# Patient Record
Sex: Female | Born: 1950 | Race: White | Hispanic: No | Marital: Single | Smoking: Former smoker
Health system: Southern US, Community
[De-identification: ages and names within clinical notes are randomized; demographics above are authoritative.]

## PROBLEM LIST (undated history)

## (undated) DIAGNOSIS — E0590 Thyrotoxicosis, unspecified without thyrotoxic crisis or storm: Secondary | ICD-10-CM

## (undated) DIAGNOSIS — Z9189 Other specified personal risk factors, not elsewhere classified: Secondary | ICD-10-CM

## (undated) DIAGNOSIS — R011 Cardiac murmur, unspecified: Secondary | ICD-10-CM

## (undated) DIAGNOSIS — M1990 Unspecified osteoarthritis, unspecified site: Secondary | ICD-10-CM

## (undated) DIAGNOSIS — R7303 Prediabetes: Secondary | ICD-10-CM

## (undated) DIAGNOSIS — I8390 Asymptomatic varicose veins of unspecified lower extremity: Secondary | ICD-10-CM

## (undated) DIAGNOSIS — J309 Allergic rhinitis, unspecified: Secondary | ICD-10-CM

## (undated) DIAGNOSIS — T7840XA Allergy, unspecified, initial encounter: Secondary | ICD-10-CM

## (undated) HISTORY — DX: Thyrotoxicosis, unspecified without thyrotoxic crisis or storm: E05.90

## (undated) HISTORY — DX: Other specified personal risk factors, not elsewhere classified: Z91.89

## (undated) HISTORY — DX: Asymptomatic varicose veins of unspecified lower extremity: I83.90

## (undated) HISTORY — DX: Allergy, unspecified, initial encounter: T78.40XA

## (undated) HISTORY — DX: Allergic rhinitis, unspecified: J30.9

---

## 1998-04-06 ENCOUNTER — Other Ambulatory Visit: Admission: RE | Admit: 1998-04-06 | Discharge: 1998-04-06 | Payer: Self-pay | Admitting: *Deleted

## 1999-09-27 ENCOUNTER — Other Ambulatory Visit: Admission: RE | Admit: 1999-09-27 | Discharge: 1999-09-27 | Payer: Self-pay | Admitting: *Deleted

## 2000-06-03 ENCOUNTER — Encounter: Payer: Self-pay | Admitting: *Deleted

## 2000-06-03 ENCOUNTER — Encounter: Admission: RE | Admit: 2000-06-03 | Discharge: 2000-06-03 | Payer: Self-pay | Admitting: *Deleted

## 2000-10-18 ENCOUNTER — Other Ambulatory Visit: Admission: RE | Admit: 2000-10-18 | Discharge: 2000-10-18 | Payer: Self-pay | Admitting: *Deleted

## 2001-11-05 ENCOUNTER — Encounter: Payer: Self-pay | Admitting: *Deleted

## 2001-11-05 ENCOUNTER — Encounter: Admission: RE | Admit: 2001-11-05 | Discharge: 2001-11-05 | Payer: Self-pay | Admitting: *Deleted

## 2001-11-13 ENCOUNTER — Other Ambulatory Visit: Admission: RE | Admit: 2001-11-13 | Discharge: 2001-11-13 | Payer: Self-pay | Admitting: *Deleted

## 2002-05-01 ENCOUNTER — Encounter: Admission: RE | Admit: 2002-05-01 | Discharge: 2002-05-01 | Payer: Self-pay | Admitting: Family Medicine

## 2002-05-01 ENCOUNTER — Encounter: Payer: Self-pay | Admitting: Family Medicine

## 2002-11-16 ENCOUNTER — Other Ambulatory Visit: Admission: RE | Admit: 2002-11-16 | Discharge: 2002-11-16 | Payer: Self-pay | Admitting: *Deleted

## 2003-10-15 ENCOUNTER — Emergency Department (HOSPITAL_COMMUNITY): Admission: EM | Admit: 2003-10-15 | Discharge: 2003-10-16 | Payer: Self-pay | Admitting: Emergency Medicine

## 2004-02-14 ENCOUNTER — Other Ambulatory Visit: Admission: RE | Admit: 2004-02-14 | Discharge: 2004-02-14 | Payer: Self-pay | Admitting: *Deleted

## 2004-06-23 ENCOUNTER — Ambulatory Visit: Payer: Self-pay | Admitting: Family Medicine

## 2004-11-03 ENCOUNTER — Ambulatory Visit: Payer: Self-pay | Admitting: Family Medicine

## 2004-11-28 ENCOUNTER — Ambulatory Visit: Payer: Self-pay | Admitting: Family Medicine

## 2005-07-30 ENCOUNTER — Other Ambulatory Visit: Admission: RE | Admit: 2005-07-30 | Discharge: 2005-07-30 | Payer: Self-pay | Admitting: Obstetrics & Gynecology

## 2006-03-05 ENCOUNTER — Ambulatory Visit: Payer: Self-pay | Admitting: Family Medicine

## 2006-03-05 LAB — CONVERTED CEMR LAB
ALT: 19 units/L (ref 0–40)
AST: 19 units/L (ref 0–37)
Albumin: 3.7 g/dL (ref 3.5–5.2)
BUN: 18 mg/dL (ref 6–23)
Basophils Relative: 0 % (ref 0.0–1.0)
Creatinine, Ser: 0.9 mg/dL (ref 0.4–1.2)
Eosinophil percent: 3.6 % (ref 0.0–5.0)
GFR calc non Af Amer: 70 mL/min
HCT: 37.7 % (ref 36.0–46.0)
HDL: 79.3 mg/dL (ref >39.0)
Hemoglobin: 13 g/dL (ref 12.0–15.0)
LDL DIRECT: 113.2 mg/dL
Neutro Abs: 3.5 10*3/uL (ref 1.4–7.7)
Neutrophils Relative %: 60.1 % (ref 43.0–77.0)
Potassium: 4.3 meq/L (ref 3.5–5.1)
RBC: 4.27 M/uL (ref 3.87–5.11)
RDW: 13 % (ref 11.5–14.6)
Sodium: 148 meq/L — ABNORMAL HIGH (ref 135–145)
Total Bilirubin: 0.6 mg/dL (ref 0.3–1.2)
Total Protein: 7.6 g/dL (ref 6.0–8.3)
VLDL: 18 mg/dL (ref 0–40)
WBC: 5.7 10*3/uL (ref 4.5–10.5)

## 2006-03-11 ENCOUNTER — Ambulatory Visit: Payer: Self-pay | Admitting: Family Medicine

## 2006-03-26 ENCOUNTER — Ambulatory Visit: Payer: Self-pay | Admitting: Internal Medicine

## 2006-03-28 ENCOUNTER — Encounter: Admission: RE | Admit: 2006-03-28 | Discharge: 2006-03-28 | Payer: Self-pay | Admitting: Family Medicine

## 2007-04-23 ENCOUNTER — Ambulatory Visit: Payer: Self-pay | Admitting: Family Medicine

## 2007-04-23 LAB — CONVERTED CEMR LAB
ALT: 21 units/L (ref 0–35)
AST: 22 units/L (ref 0–37)
Albumin: 3.5 g/dL (ref 3.5–5.2)
Alkaline Phosphatase: 87 units/L (ref 39–117)
BUN: 17 mg/dL (ref 6–23)
Basophils Absolute: 0.1 10*3/uL (ref 0.0–0.1)
Basophils Relative: 1 % (ref 0.0–1.0)
Bilirubin Urine: NEGATIVE
Bilirubin, Direct: 0.2 mg/dL (ref 0.0–0.3)
Blood in Urine, dipstick: NEGATIVE
CO2: 30 meq/L (ref 19–32)
Calcium: 10 mg/dL (ref 8.4–10.5)
Chloride: 108 meq/L (ref 96–112)
Cholesterol: 201 mg/dL (ref 0–200)
Creatinine, Ser: 0.9 mg/dL (ref 0.4–1.2)
Direct LDL: 104.9 mg/dL
Eosinophils Absolute: 0.3 10*3/uL (ref 0.0–0.6)
Eosinophils Relative: 4.5 % (ref 0.0–5.0)
GFR calc Af Amer: 84 mL/min
GFR calc non Af Amer: 69 mL/min
Glucose, Bld: 99 mg/dL (ref 70–99)
Glucose, Urine, Semiquant: NEGATIVE
HCT: 36.9 % (ref 36.0–46.0)
HDL: 83.2 mg/dL (ref >39.0)
Hemoglobin: 12.4 g/dL (ref 12.0–15.0)
Ketones, urine, test strip: NEGATIVE
Lymphocytes Relative: 23.3 % (ref 12.0–46.0)
MCHC: 33.6 g/dL (ref 30.0–36.0)
MCV: 87.5 fL (ref 78.0–100.0)
Monocytes Absolute: 0.6 10*3/uL (ref 0.2–0.7)
Monocytes Relative: 10.1 % (ref 3.0–11.0)
Neutro Abs: 3.6 10*3/uL (ref 1.4–7.7)
Neutrophils Relative %: 61.1 % (ref 43.0–77.0)
Nitrite: NEGATIVE
Platelets: 370 10*3/uL (ref 150–400)
Potassium: 5.3 meq/L — ABNORMAL HIGH (ref 3.5–5.1)
RBC: 4.22 M/uL (ref 3.87–5.11)
RDW: 12.5 % (ref 11.5–14.6)
Sodium: 146 meq/L — ABNORMAL HIGH (ref 135–145)
Specific Gravity, Urine: 1.02
TSH: 3.83 microintl units/mL (ref 0.35–5.50)
Total Bilirubin: 0.8 mg/dL (ref 0.3–1.2)
Total CHOL/HDL Ratio: 2.4
Total Protein: 6.9 g/dL (ref 6.0–8.3)
Triglycerides: 70 mg/dL (ref 0–149)
VLDL: 14 mg/dL (ref 0–40)
WBC: 6 10*3/uL (ref 4.5–10.5)
pH: 6

## 2007-04-29 ENCOUNTER — Ambulatory Visit: Payer: Self-pay | Admitting: Family Medicine

## 2007-11-28 ENCOUNTER — Other Ambulatory Visit: Admission: RE | Admit: 2007-11-28 | Discharge: 2007-11-28 | Payer: Self-pay | Admitting: Obstetrics & Gynecology

## 2008-04-21 ENCOUNTER — Encounter: Admission: RE | Admit: 2008-04-21 | Discharge: 2008-04-21 | Payer: Self-pay | Admitting: Family Medicine

## 2009-12-06 ENCOUNTER — Ambulatory Visit: Payer: Self-pay | Admitting: Internal Medicine

## 2009-12-07 LAB — CONVERTED CEMR LAB
Basophils Relative: 0.5 % (ref 0.0–3.0)
Bilirubin, Direct: 0.1 mg/dL (ref 0.0–0.3)
Cholesterol: 180 mg/dL (ref 0–200)
Eosinophils Absolute: 0.3 10*3/uL (ref 0.0–0.7)
GFR calc non Af Amer: 82.13 mL/min (ref >60)
HDL: 84.8 mg/dL (ref >39.00)
Hemoglobin, Urine: NEGATIVE
MCHC: 33.5 g/dL (ref 30.0–36.0)
MCV: 89.1 fL (ref 78.0–100.0)
Monocytes Absolute: 0.6 10*3/uL (ref 0.1–1.0)
Neutrophils Relative %: 64.8 % (ref 43.0–77.0)
Nitrite: NEGATIVE
Platelets: 325 10*3/uL (ref 150.0–400.0)
Potassium: 4.7 meq/L (ref 3.5–5.1)
RBC: 4.23 M/uL (ref 3.87–5.11)
Sodium: 144 meq/L (ref 135–145)
Total Protein, Urine: NEGATIVE mg/dL
Total Protein: 6.7 g/dL (ref 6.0–8.3)
Triglycerides: 61 mg/dL (ref 0.0–149.0)
Urobilinogen, UA: 0.2 (ref 0.0–1.0)
VLDL: 12.2 mg/dL (ref 0.0–40.0)

## 2009-12-21 ENCOUNTER — Ambulatory Visit: Payer: Self-pay | Admitting: Internal Medicine

## 2009-12-21 ENCOUNTER — Encounter: Payer: Self-pay | Admitting: Internal Medicine

## 2009-12-21 DIAGNOSIS — Z9189 Other specified personal risk factors, not elsewhere classified: Secondary | ICD-10-CM

## 2009-12-21 DIAGNOSIS — Z87891 Personal history of nicotine dependence: Secondary | ICD-10-CM | POA: Insufficient documentation

## 2009-12-21 DIAGNOSIS — J309 Allergic rhinitis, unspecified: Secondary | ICD-10-CM | POA: Insufficient documentation

## 2009-12-21 DIAGNOSIS — R011 Cardiac murmur, unspecified: Secondary | ICD-10-CM | POA: Insufficient documentation

## 2009-12-21 HISTORY — DX: Other specified personal risk factors, not elsewhere classified: Z91.89

## 2009-12-23 LAB — CONVERTED CEMR LAB
T4, Total: 7.5 ug/dL (ref 5.0–12.5)
TSH: 0.26 microintl units/mL — ABNORMAL LOW (ref 0.35–5.50)

## 2010-03-20 ENCOUNTER — Encounter: Admission: RE | Admit: 2010-03-20 | Discharge: 2010-03-20 | Payer: Self-pay | Admitting: Internal Medicine

## 2010-03-20 LAB — HM MAMMOGRAPHY: HM Mammogram: NEGATIVE

## 2010-06-01 NOTE — Assessment & Plan Note (Signed)
Summary: NEW Cpx/BCBS/#/CD   Vital Signs:  Patient profile:   60 year old female Height:      65 inches (165.10 cm) Weight:      174.8 pounds (79.45 kg) BMI:     29.19 O2 Sat:      97 % on Room air Temp:     97.8 degrees F (36.56 degrees C) oral Pulse rate:   64 / minute BP sitting:   120 / 78  (left arm)  Vitals Entered By: Orlan Leavens RMA (December 21, 2009 3:22 PM)  O2 Flow:  Room air CC: New patient CPX Is Patient Diabetic? No Pain Assessment Patient in pain? no        Primary Care Provider:  Newt Lukes MD  CC:  New patient CPX.  History of Present Illness: new pt to me and our location, prev followed at [place] office location (last seen 03/2007)  patient is here today for annual physical. Patient feels well and has no complaints.   recent death of mom last week - sympathy offered  noted abnormal TSH on labs earlier this month - no hx thyroid problems  Preventive Screening-Counseling & Management  Alcohol-Tobacco     Alcohol drinks/day: <1     Alcohol Counseling: not indicated; use of alcohol is not excessive or problematic     Smoking Status: quit     Tobacco Counseling: to remain off tobacco products  Caffeine-Diet-Exercise     Type of exercise: trainer, gym     Exercise (avg: min/session): 30-60     Times/week: 4     Exercise Counseling: not indicated; exercise is adequate  Safety-Violence-Falls     Seat Belt Counseling: not indicated; patient wears seat belts     Firearm Counseling: not applicable     Violence Counseling: not indicated; no violence risk noted     Fall Risk Counseling: not indicated; no significant falls noted  Clinical Review Panels:  Prevention   Last Mammogram:  ASSESSMENT: Negative - BI-RADS 1^MM DIGITAL SCREENING (04/21/2008)   Last Pap Smear:  Interpretation Result:Negative for intraepithelial Lesion or Malignancy.    (01/29/2008)  Lipid Management   Cholesterol:  180 (12/06/2009)   LDL (bad choesterol):  83  (12/06/2009)   HDL (good cholesterol):  84.80 (12/06/2009)   Triglycerides:  89 (03/05/2006)  CBC   WBC:  6.8 (12/06/2009)   RBC:  4.23 (12/06/2009)   Hgb:  12.6 (12/06/2009)   Hct:  37.7 (12/06/2009)   Platelets:  325.0 (12/06/2009)   MCV  89.1 (12/06/2009)   MCHC  33.5 (12/06/2009)   RDW  13.3 (12/06/2009)   PMN:  64.8 (12/06/2009)   Lymphs:  22.0 (12/06/2009)   Monos:  8.7 (12/06/2009)   Eosinophils:  4.0 (12/06/2009)   Basophil:  0.5 (12/06/2009)  Complete Metabolic Panel   Glucose:  96 (12/06/2009)   Sodium:  144 (12/06/2009)   Potassium:  4.7 (12/06/2009)   Chloride:  106 (12/06/2009)   CO2:  30 (12/06/2009)   BUN:  14 (12/06/2009)   Creatinine:  0.8 (12/06/2009)   Albumin:  3.7 (12/06/2009)   Total Protein:  6.7 (12/06/2009)   Calcium:  9.6 (12/06/2009)   Total Bili:  0.7 (12/06/2009)   Alk Phos:  74 (12/06/2009)   SGPT (ALT):  17 (12/06/2009)   SGOT (AST):  18 (12/06/2009)   Current Medications (verified): 1)  Loratadine 10 Mg Tabs (Loratadine) .... Take 1 By Mouth Once Daily 2)  Restasis 0.05 % Emul (Cyclosporine) .... Use 2 Drops  in The Morning  Allergies (verified): 1)  ! Steroids  Past History:  Past Medical History: Allergic rhinitis  MD roster: gyn - Leda Quail  Past Surgical History: Denies surgical history  Family History: father: coronary disease and autoimmune disease, smoker  dad died in Sep 12, 2001 of those diseases.  Mother had an MI at 96, smoker.  Three brothers in good health.  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 I have reviewed all other systems and they were negative.   Physical Exam  General:  overweight-appearing.  alert, well-developed, well-nourished, and cooperative to examination.    Head:  Normocephalic and atraumatic without obvious  abnormalities. No apparent alopecia or balding. Eyes:  vision grossly intact; pupils equal, round and reactive to light.  conjunctiva and lids normal.    Ears:  normal pinnae bilaterally, without erythema, swelling, or tenderness to palpation. TMs clear, without effusion, or cerumen impaction. Hearing grossly normal bilaterally  Mouth:  teeth and gums in good repair; mucous membranes moist, without lesions or ulcers. oropharynx clear without exudate, no erythema.  Neck:  supple, full ROM, no masses, no thyromegaly; no thyroid nodules or tenderness. no JVD or carotid bruits.   Lungs:  normal respiratory effort, no intercostal retractions or use of accessory muscles; normal breath sounds bilaterally - no crackles and no wheezes.    Heart:  normal rate, regular rhythm, no murmur, and no rub. BLE without edema. normal DP pulses and normal cap refill in all 4 extremities    Abdomen:  soft, non-tender, normal bowel sounds, no distention; no masses and no appreciable hepatomegaly or splenomegaly.   Genitalia:  defer gyn Msk:  No deformity or scoliosis noted of thoracic or lumbar spine.   Neurologic:  alert & oriented X3 and cranial nerves II-XII symetrically intact.  strength normal in all extremities, sensation intact to light touch, and gait normal. speech fluent without dysarthria or aphasia; follows commands with good comprehension.  Skin:  no rashes, vesicles, ulcers, or erythema. No nodules or irregularity to palpation.  Psych:  Oriented X3, memory intact for recent and remote, normally interactive, good eye contact, not anxious appearing, not depressed appearing, and not agitated.      Impression & Recommendations:  Problem # 1:  HEALTH SCREENING (ICD-V70.0) Patient has been counseled on age-appropriate routine health concerns for screening and prevention. These are reviewed and up-to-date. Immunizations are up-to-date or declined. Labs and ECG reviewed.  declines colo and vaccines, will self sched  mammo Orders: EKG w/ Interpretation (93000)  Problem # 2:  THYROID STIMULATING HORMONE, ABNORMAL (ICD-246.9) incidental on CPX labs - few clinical symptoms hyperthyroid or toxicity- recheck TFTs now and ?[place] if abn -  Orders: TLB-T3, Free (Triiodothyronine) (84481-T3FREE) TLB-TSH (Thyroid Stimulating Hormone) (84443-TSH) TLB-T4 (Thyrox), Total (84436-T4)  Complete Medication List: 1)  Loratadine 10 Mg Tabs (Loratadine) .... Take 1 by mouth once daily 2)  Restasis 0.05 % Emul (Cyclosporine) .... Use 2 drops in the morning  Patient Instructions: 1)  it was good to see you today. 2)  exam, EKG and labs reviewed today - 3)  test(s) ordered today for thyroid- your results will be posted on the phone tree for review in 48-72 hours from the time of test completion; call [phone] and enter your 9 digit MRN (listed above on this page, just below  your name); if any changes need to be made or there are abnormal results, you will be contacted directly.  4)  Schedule your mammogram in October as discussed. 5)  Complete your Hemocult cards and return them soon. 6)  Please schedule a follow-up appointment annually for your medical physical, sooner if problems.    Mammogram  Procedure date:  01/29/2008  Findings:      No specific mammographic evidence of malignancy.    Pap Smear  Procedure date:  01/29/2008  Findings:      Interpretation Result:Negative for intraepithelial Lesion or Malignancy.      Contraindications/Deferment of Procedures/Staging:    Test/Procedure: FLU VAX    Reason for deferment: patient declined     Test/Procedure: Colonoscopy    Reason for deferment: patient declined     Test/Procedure: TD vaccine    Reason for deferment: declined

## 2011-09-25 ENCOUNTER — Telehealth: Payer: Self-pay | Admitting: *Deleted

## 2011-09-25 DIAGNOSIS — Z0000 Encounter for general adult medical examination without abnormal findings: Secondary | ICD-10-CM

## 2011-09-25 NOTE — Telephone Encounter (Signed)
Message copied by Deatra James on Tue Sep 25, 2011  8:30 AM ------      Message from: Etheleen Sia      Created: Fri Sep 21, 2011 10:51 AM      Regarding: PHYSICAL LABS       PHYSICAL LABS

## 2011-09-25 NOTE — Telephone Encounter (Signed)
Received staff msg pt made cpx for July. Need labs entered... 09/25/11@8 :31am/LMB

## 2011-10-01 ENCOUNTER — Encounter: Payer: Self-pay | Admitting: Internal Medicine

## 2011-10-31 ENCOUNTER — Other Ambulatory Visit (INDEPENDENT_AMBULATORY_CARE_PROVIDER_SITE_OTHER): Payer: Self-pay

## 2011-10-31 DIAGNOSIS — Z0000 Encounter for general adult medical examination without abnormal findings: Secondary | ICD-10-CM

## 2011-10-31 LAB — CBC WITH DIFFERENTIAL/PLATELET
Basophils Relative: 0.4 % (ref 0.0–3.0)
Eosinophils Absolute: 0.2 10*3/uL (ref 0.0–0.7)
HCT: 36.5 % (ref 36.0–46.0)
Lymphs Abs: 1.8 10*3/uL (ref 0.7–4.0)
MCHC: 33 g/dL (ref 30.0–36.0)
MCV: 84.9 fl (ref 78.0–100.0)
Monocytes Absolute: 0.6 10*3/uL (ref 0.1–1.0)
Neutrophils Relative %: 51.1 % (ref 43.0–77.0)
Platelets: 284 10*3/uL (ref 150.0–400.0)

## 2011-10-31 LAB — TSH: TSH: 0.02 u[IU]/mL — ABNORMAL LOW (ref 0.35–5.50)

## 2011-10-31 LAB — URINALYSIS, ROUTINE W REFLEX MICROSCOPIC
Hgb urine dipstick: NEGATIVE
Nitrite: NEGATIVE
Total Protein, Urine: NEGATIVE

## 2011-10-31 LAB — BASIC METABOLIC PANEL
BUN: 15 mg/dL (ref 6–23)
CO2: 30 mEq/L (ref 19–32)
Chloride: 107 mEq/L (ref 96–112)
Creatinine, Ser: 0.6 mg/dL (ref 0.4–1.2)
Glucose, Bld: 106 mg/dL — ABNORMAL HIGH (ref 70–99)

## 2011-10-31 LAB — LIPID PANEL
Cholesterol: 182 mg/dL (ref 0–200)
Triglycerides: 62 mg/dL (ref 0.0–149.0)

## 2011-10-31 LAB — HEPATIC FUNCTION PANEL
ALT: 23 U/L (ref 0–35)
Albumin: 3.6 g/dL (ref 3.5–5.2)
Total Bilirubin: 0.9 mg/dL (ref 0.3–1.2)

## 2011-11-05 ENCOUNTER — Ambulatory Visit (INDEPENDENT_AMBULATORY_CARE_PROVIDER_SITE_OTHER): Payer: BC Managed Care – PPO | Admitting: Internal Medicine

## 2011-11-05 ENCOUNTER — Encounter: Payer: Self-pay | Admitting: Internal Medicine

## 2011-11-05 VITALS — BP 130/78 | HR 69 | Temp 97.8°F | Ht 66.0 in | Wt 169.8 lb

## 2011-11-05 DIAGNOSIS — E0590 Thyrotoxicosis, unspecified without thyrotoxic crisis or storm: Secondary | ICD-10-CM

## 2011-11-05 DIAGNOSIS — M7650 Patellar tendinitis, unspecified knee: Secondary | ICD-10-CM

## 2011-11-05 DIAGNOSIS — I8390 Asymptomatic varicose veins of unspecified lower extremity: Secondary | ICD-10-CM

## 2011-11-05 DIAGNOSIS — Z0000 Encounter for general adult medical examination without abnormal findings: Secondary | ICD-10-CM

## 2011-11-05 MED ORDER — CLOBETASOL PROPIONATE 0.05 % EX OINT: TOPICAL_OINTMENT | Freq: Two times a day (BID) | CUTANEOUS | Status: AC

## 2011-11-05 MED ORDER — NAPROXEN SODIUM 220 MG PO TABS: 440.0000 mg | ORAL_TABLET | Freq: Two times a day (BID) | ORAL | Status: DC

## 2011-11-05 NOTE — Progress Notes (Signed)
Subjective:    Patient ID: Jaime Rose, female    DOB: 1950/08/26, 61 y.o.   MRN: [id number]  HPI patient is here today for annual physical. Patient feels well overall.  Past Medical History  Diagnosis Date  . ALLERGIC RHINITIS   . Hyperthyroidism     normal TFTs 11/2009 , dx 10/2011   Family History  Problem Relation Age of Onset  . Heart disease Father   . Autoimmune disease Father    History  Substance Use Topics  . Smoking status: Never Smoker   . Smokeless tobacco: Not on file   Comment: Singles, lives with 2 dtrs- Victory Dakin & Tresa Endo  . Alcohol Use: No    Review of Systems Constitutional: Negative for fever - unexpected 10# weight loss.  Respiratory: Negative for cough and shortness of breath.   Cardiovascular: Negative for chest pain or palpitations.  Gastrointestinal: Negative for abdominal pain, no bowel changes.  Musculoskeletal: Ongoing B anterior knee pain x 2 months -Negative for gait problem or joint swelling.  Skin: Negative for rash. increasing varicose veins on BLE Neurological: Negative for dizziness or headache.  No other specific complaints in a complete review of systems (except as listed in HPI above).     Objective:   Physical Exam BP 130/78  Pulse 69  Temp 97.8 F (36.6 C) (Oral)  Ht 5\' 6"  (1.676 m)  Wt 169 lb 12.8 oz (77.021 kg)  BMI 27.41 kg/m2  SpO2 97% Wt Readings from Last 3 Encounters:  11/05/11 169 lb 12.8 oz (77.021 kg)  12/21/09 174 lb 12.8 oz (79.289 kg)  04/29/07 191 lb (86.637 kg)   Constitutional: She appears well-developed and well-nourished. No distress.  HENT: Head: Normocephalic and atraumatic. Ears: B TMs ok, no erythema or effusion; Nose: Nose normal. Mouth/Throat: Oropharynx is clear and moist. No oropharyngeal exudate.  Eyes: Conjunctivae and EOM are normal. Pupils are equal, round, and reactive to light. No scleral icterus.  Neck: Normal range of motion. Neck supple. No JVD present. No thyromegaly present.    Cardiovascular: Normal rate, regular rhythm and normal heart sounds.  No murmur heard. No BLE edema. Pulmonary/Chest: Effort normal and breath sounds normal. No respiratory distress. She has no wheezes.  Abdominal: Soft. Bowel sounds are normal. She exhibits no distension. There is no tenderness. no masses Musculoskeletal: B knees with normal range of motion, no joint effusions. No gross deformities.  Neurological: She is alert and oriented to person, place, and time. No cranial nerve deficit. Coordination normal.  Skin: BLE varicose veins - Skin is warm and dry. No rash noted. No erythema.  Psychiatric: She has a normal mood and affect. Her behavior is normal. Judgment and thought content normal.   Lab Results  Component Value Date   WBC 5.5 10/31/2011   HGB 12.1 10/31/2011   HCT 36.5 10/31/2011   PLT 284.0 10/31/2011   GLUCOSE 106* 10/31/2011   CHOL 182 10/31/2011   TRIG 62.0 10/31/2011   HDL 88.00 10/31/2011   LDLDIRECT 104.9 04/23/2007   LDLCALC 82 10/31/2011   ALT 23 10/31/2011   AST 16 10/31/2011   NA 142 10/31/2011   K 4.1 10/31/2011   CL 107 10/31/2011   CREATININE 0.6 10/31/2011   BUN 15 10/31/2011   CO2 30 10/31/2011   TSH 0.02* 10/31/2011   NWG:NFAOZ @ 84 bpm - occ PAC - no ischemic changes    Assessment & Plan:  CPX/v70.0 - Patient has been counseled on age-appropriate routine health concerns for screening  and prevention. These are reviewed and up-to-date. Immunizations are up-to-date or declined. Labs and ECG reviewed.  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

## 2011-11-05 NOTE — Patient Instructions (Signed)
It was good to see you today. Health Maintenance reviewed - will plan colonoscopy summer 2014, PAP in next 1-2 years, mammogram before 02/2012 and Shingles vaccine after age 59 - all other recommended immunizations and age-appropriate screenings are up-to-date. We have reviewed your prior records including labs and tests today Use Aleve for your knee tendonitis as discussed we'll make referral to endocrinology for your over active thyroid and vascular specialist for your varicose veins. Our office will contact you regarding appointment(s) once made. Medications reviewed, no other changes at this time. Refill on medication(s) as discussed today. Please schedule followup in 1 year for physical/labs, call sooner if problems.

## 2011-11-05 NOTE — Assessment & Plan Note (Signed)
Discussed dx and possible tx options Will refer to endo for mgmt of same

## 2011-11-07 ENCOUNTER — Other Ambulatory Visit: Payer: Self-pay

## 2011-11-08 ENCOUNTER — Other Ambulatory Visit: Payer: Self-pay

## 2011-11-08 DIAGNOSIS — I83893 Varicose veins of bilateral lower extremities with other complications: Secondary | ICD-10-CM

## 2011-11-19 ENCOUNTER — Encounter: Payer: Self-pay | Admitting: Endocrinology

## 2011-11-19 ENCOUNTER — Ambulatory Visit (INDEPENDENT_AMBULATORY_CARE_PROVIDER_SITE_OTHER): Payer: BC Managed Care – PPO | Admitting: Endocrinology

## 2011-11-19 VITALS — BP 128/82 | HR 71 | Temp 99.1°F | Ht 66.0 in | Wt 168.0 lb

## 2011-11-19 DIAGNOSIS — E0590 Thyrotoxicosis, unspecified without thyrotoxic crisis or storm: Secondary | ICD-10-CM

## 2011-11-19 NOTE — Patient Instructions (Addendum)
let's check a thyroid "scan" (a special, but easy and painless type of thyroid x ray).  It works like this: you go to the x-ray department of the hospital to swallow a pill, which contains a miniscule amount of radiation.  You will not notice any symptoms from this.  You will go back to the x-ray department the next day, to lie down in front of a camera.  The results of this will be sent to me.   Based on the results, if you agree, i would order for you a treatment pill of radioactive iodine.  Although it is a larger amount of radiation, you will again notice no symptoms from this.  The pill is gone from your body in a few days (during which you should stay away from other people), but takes several months to work.  Therefore, please return here approximately 6-8 weeks after the treatment.  This treatment has been available for many years, and the only known side-effect is an underactive thyroid.  It is possible that i would eventually prescribe for you a thyroid hormone pill, which is very inexpensive.  You don't have to worry about side-effects of this thyroid hormone pill, because it is the same molecule your thyroid makes.

## 2011-11-19 NOTE — Progress Notes (Signed)
Subjective:    Patient ID: Jaime Rose, female    DOB: [birth year], 61 y.o.   MRN: [id number]  HPI Pt was noted to have slightly suppressed tsh in 2011.  It was more suppressed recently.  She reports few mos of slight weight-loss, but no palpitations in the chest.  No assoc anxiety. Past Medical History  Diagnosis Date  . ALLERGIC RHINITIS   . Hyperthyroidism     normal TFTs 11/2009 , dx 10/2011    Past Surgical History  Procedure Date  . No past surgeries     History   Social History  . Marital Status: Single    Spouse Name: N/A    Number of Children: N/A  . Years of Education: N/A   Occupational History  . Not on file.   Social History Main Topics  . Smoking status: Never Smoker   . Smokeless tobacco: Not on file   Comment: Singles, lives with 2 dtrs- Victory Dakin & Tresa Endo  . Alcohol Use: No  . Drug Use: No  . Sexually Active:    Other Topics Concern  . Not on file   Social History Narrative  . No narrative on file    Current Outpatient Prescriptions on File Prior to Visit  Medication Sig Dispense Refill  . clobetasol ointment (TEMOVATE) 0.05 % Apply topically 2 (two) times daily.  30 g  0  . cycloSPORINE (RESTASIS) 0.05 % ophthalmic emulsion 2 drops every morning.      . loratadine (CLARITIN) 10 MG tablet Take 10 mg by mouth as needed.         No Known Allergies  Family History  Problem Relation Age of Onset  . Heart disease Father   . Autoimmune disease Father   no goiter or other thyroid problem BP 128/82  Pulse 71  Temp 99.1 F (37.3 C) (Oral)  Ht 5\' 6"  (1.676 m)  Wt 168 lb (76.204 kg)  BMI 27.12 kg/m2  SpO2 98%  Review of Systems denies weight loss, headache, hoarseness, double vision, sob, diarrhea, polyuria, myalgias, excessive diaphoresis, numbness, tremor, anxiety, menopausal sxs, easy bruising, and rhinorrhea.    Objective:   Physical Exam VS: see vs page GEN: no distress HEAD: head: no deformity eyes: no periorbital swelling, no  proptosis external nose and ears are normal mouth: no lesion seen NECK: supple, thyroid is not enlarged CHEST WALL: no deformity LUNGS:  Clear to auscultation CV: reg rate and rhythm, no murmur ABD: abdomen is soft, nontender.  no hepatosplenomegaly.  not distended.  no hernia MUSCULOSKELETAL: muscle bulk and strength are grossly normal.  no obvious joint swelling.  gait is normal and steady EXTEMITIES: no deformity.  no ulcer on the feet.  feet are of normal color and temp.  no edema PULSES: dorsalis pedis intact bilat.  no carotid bruit NEURO:  cn 2-12 grossly intact.   readily moves all 4's.  sensation is intact to touch on the feet.  No tremor SKIN:  Normal texture and temperature.  No rash or suspicious lesion is visible.  Not diaphoretic.  NODES:  None palpable at the neck.   PSYCH: alert, oriented x3.  Does not appear anxious nor depressed. Lab Results  Component Value Date   TSH 0.02* 10/31/2011   T4TOTAL 7.5 12/21/2009      Assessment & Plan:  Hyperthyroidism, uncertain etiology.  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  This can increase the risk of grave's dx, even years  later

## 2011-11-28 ENCOUNTER — Ambulatory Visit (HOSPITAL_COMMUNITY): Payer: BC Managed Care – PPO

## 2011-11-29 ENCOUNTER — Other Ambulatory Visit (HOSPITAL_COMMUNITY): Payer: BC Managed Care – PPO

## 2011-12-03 ENCOUNTER — Encounter (HOSPITAL_COMMUNITY)
Admission: RE | Admit: 2011-12-03 | Discharge: 2011-12-03 | Disposition: A | Discharge status: Home / Self Care | Payer: BC Managed Care – PPO | Source: Ambulatory Visit | Attending: Endocrinology | Admitting: Endocrinology

## 2011-12-03 ENCOUNTER — Ambulatory Visit (HOSPITAL_COMMUNITY): Payer: BC Managed Care – PPO

## 2011-12-03 DIAGNOSIS — E0590 Thyrotoxicosis, unspecified without thyrotoxic crisis or storm: Secondary | ICD-10-CM | POA: Insufficient documentation

## 2011-12-04 ENCOUNTER — Encounter (HOSPITAL_COMMUNITY)
Admission: RE | Admit: 2011-12-04 | Discharge: 2011-12-04 | Disposition: A | Discharge status: Home / Self Care | Payer: BC Managed Care – PPO | Source: Ambulatory Visit | Attending: Endocrinology | Admitting: Endocrinology

## 2011-12-04 ENCOUNTER — Other Ambulatory Visit: Payer: Self-pay | Admitting: Endocrinology

## 2011-12-04 DIAGNOSIS — E0590 Thyrotoxicosis, unspecified without thyrotoxic crisis or storm: Secondary | ICD-10-CM | POA: Insufficient documentation

## 2011-12-04 MED ORDER — SODIUM PERTECHNETATE TC 99M INJECTION
11.0000 | Freq: Once | INTRAVENOUS | Status: AC | PRN
  Administered 2011-12-04: 11 via INTRAVENOUS

## 2011-12-04 MED ORDER — SODIUM IODIDE I 131 CAPSULE
8.0000 | Freq: Once | INTRAVENOUS | Status: AC | PRN
  Administered 2011-12-04: 8 via ORAL

## 2011-12-13 ENCOUNTER — Ambulatory Visit (HOSPITAL_COMMUNITY): Payer: BC Managed Care – PPO

## 2011-12-19 ENCOUNTER — Encounter: Payer: BC Managed Care – PPO | Admitting: Vascular Surgery

## 2011-12-20 ENCOUNTER — Telehealth: Payer: Self-pay | Admitting: *Deleted

## 2011-12-20 ENCOUNTER — Encounter: Payer: Self-pay | Admitting: Vascular Surgery

## 2011-12-20 NOTE — Telephone Encounter (Signed)
Pt is requesting to pick-up a copy of the thyroid scan that was done @ [facility]. Inform pt will leave up front for pick-up... 12/20/11@9 :27am/LMB

## 2011-12-21 ENCOUNTER — Ambulatory Visit (INDEPENDENT_AMBULATORY_CARE_PROVIDER_SITE_OTHER): Payer: BC Managed Care – PPO | Admitting: Vascular Surgery

## 2011-12-21 ENCOUNTER — Encounter (INDEPENDENT_AMBULATORY_CARE_PROVIDER_SITE_OTHER): Payer: BC Managed Care – PPO | Admitting: *Deleted

## 2011-12-21 ENCOUNTER — Encounter: Payer: Self-pay | Admitting: Vascular Surgery

## 2011-12-21 VITALS — BP 140/66 | HR 67 | Resp 16 | Ht 66.0 in | Wt 166.0 lb

## 2011-12-21 DIAGNOSIS — I83893 Varicose veins of bilateral lower extremities with other complications: Secondary | ICD-10-CM

## 2011-12-21 NOTE — Progress Notes (Addendum)
[facility]  Referred by:  Jaime Lukes, MD [address] [place], [place] [address]  Reason for referral: B varicose veins  History of Present Illness  Jaime Rose is a 61 y.o. (04-11-1951) female who presents with chief complaint: B varicose veins.  Patient notes years of varicose vein, without worsening of protrusion of the right thigh veins the last few months.  The patient does not have swelling in either leg and denies a bursting sensation.  She does have knee pain on both sides.  The patient has hadno history of DVT, known history of varicose vein, no history of venous stasis ulcers, no history of  Lymphedema and no history of skin changes in lower legs.  There is no family history of venous disorders.  The patient has not used compression stockings in the past.  Past Medical History  Diagnosis Date  . ALLERGIC RHINITIS   . Hyperthyroidism     normal TFTs 11/2009 , dx 10/2011    Past Surgical History  Procedure Date  . No past surgeries     History   Social History  . Marital Status: Single    Spouse Name: N/A    Number of Children: N/A  . Years of Education: N/A   Occupational History  . Not on file.   Social History Main Topics  . Smoking status: Former Smoker    Types: Cigarettes    Quit date: 04/30/1988  . Smokeless tobacco: Never Used   Comment: Singles, lives with 2 dtrs- Jaime Rose & Jaime Rose  . Alcohol Use: 0.0 - 0.5 oz/week    0-1 drink(s) per week  . Drug Use: No  . Sexually Active: Not on file   Other Topics Concern  . Not on file   Social History Narrative  . No narrative on file    Family History  Problem Relation Age of Onset  . Heart disease Father   . Autoimmune disease Father   . Hypertension Father   . Other Father     varicose veins, AAA  . Hyperlipidemia Mother   . Other Mother     varicose veins, AAA  . Heart attack Mother   . Hyperlipidemia Sister     Current  Outpatient Prescriptions on File Prior to Visit  Medication Sig Dispense Refill  . clobetasol ointment (TEMOVATE) 0.05 % Apply topically 2 (two) times daily.  30 g  0  . cycloSPORINE (RESTASIS) 0.05 % ophthalmic emulsion 2 drops every morning.      . loratadine (CLARITIN) 10 MG tablet Take 10 mg by mouth as needed.         No Known Allergies  REVIEW OF SYSTEMS:  (Positives checked otherwise negative)  CARDIOVASCULAR:  [ ]  chest pain, [ ]  chest pressure, [ ]  palpitations, [ ]  shortness of breath when laying flat, [ ]  shortness of breath with exertion, [ ]  pain in feet when laying flat, [ ]  history of blood clot in veins (DVT), [ ]  history of phlebitis, [ ]  swelling in legs, [x]  varicose veins  PULMONARY:  [ ]  productive cough, [ ]  asthma, [ ]  wheezing  NEUROLOGIC:  [ ]  weakness in arms or legs, [ ]  numbness in arms or legs, [ ]  difficulty speaking or slurred speech, [ ]  temporary loss of vision in one eye, [ ]  dizziness  HEMATOLOGIC:  [ ]  bleeding problems, [ ]  problems with blood clotting too easily  MUSCULOSKEL:  [ ]   joint pain, [ ]  joint swelling  GASTROINTEST:  [ ]   Vomiting blood, [ ]   Blood in stool     GENITOURINARY:  [ ]   Burning with urination, [ ]   Blood in urine  PSYCHIATRIC:  [ ]  history of major depression  INTEGUMENTARY:  [ ]  rashes, [ ]  ulcers  CONSTITUTIONAL:  [ ]  fever, [ ]  chills  Physical Examination  Filed Vitals:   12/21/11 1159  BP: 140/66  Pulse: 67  Resp: 16  Height: 5\' 6"  (1.676 m)  Weight: 166 lb (75.297 kg)  SpO2: 100%   Body mass index is 26.79 kg/(m^2).  General: A&O x 3, WDWN  Head: [place]/AT  Ear/Nose/Throat: Hearing grossly intact, nares w/o erythema or drainage, oropharynx w/o Erythema/Exudate  Eyes: PERRLA, EOMI  Neck: Supple, no nuchal rigidity, no palpable LAD  Pulmonary: Sym exp, good air movt, CTAB, no rales, rhonchi, & wheezing  Cardiac: RRR, Nl S1, S2, no Murmurs, rubs or gallops  Vascular: Vessel Right Left  Radial  Palpable Palpable  Ulnar Palpable Palpable  Brachial Palpable Palpable  Carotid Palpable, without bruit Palpable, without bruit  Aorta Not palpable N/A  Femoral Palpable Palpable  Popliteal Not palpable Not palpable  PT Palpable Palpable  DP Palpable Palpable   Gastrointestinal: soft, NTND, -G/R, - HSM, - masses, - CVAT B  Musculoskeletal: M/S 5/5 throughout , Extremities without ischemic changes , spider veins throughout, intermittent small varicose veins, no LDS  Neurologic: CN 2-12 intact , Pain and light touch intact in extremities , Motor exam as listed above  Psychiatric: Judgment intact, Mood & affect appropriate for pt's clinical situation  Dermatologic: See M/S exam for extremity exam, no rashes otherwise noted  Lymph : No Cervical, Axillary, or Inguinal lymphadenopathy   Non-Invasive Vascular Imaging  BLE Venous Insufficiency Duplex (Date: 12/21/11):   RLE: anterior saphenous vein incompetent, normal GSV and deep system, no DVT  LLE: No reflux or thrombosis  Outside Studies/Documentation 2 pages of outside documents were reviewed including: outpatient annual physical.  Medical Decision Making  Jaime Rose is a 61 y.o. 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   Based on the patient's history and examination, I recommend: evaluation in Vein Clinic for possible stripping/phlebectomy of right ASV.    Thigh high compression stockings at 20-30 mm Hg were prescribed.  Conservative measures including elevation of leg and NSAID were also recommended.  Her spider veins are mainly cosmetic and may be amendable to sclerotherapy.  Thank you for allowing [place] to participate in this patient's care.  Jaime Sake, MD [facility] Office: [phone] Pager: [phone]  12/21/2011, 12:39 PM

## 2011-12-28 ENCOUNTER — Ambulatory Visit (HOSPITAL_COMMUNITY)
Admission: RE | Admit: 2011-12-28 | Discharge: 2011-12-28 | Disposition: A | Discharge status: Home / Self Care | Payer: BC Managed Care – PPO | Source: Ambulatory Visit | Attending: Endocrinology | Admitting: Endocrinology

## 2011-12-28 ENCOUNTER — Encounter (HOSPITAL_COMMUNITY): Payer: Self-pay

## 2011-12-28 DIAGNOSIS — E0590 Thyrotoxicosis, unspecified without thyrotoxic crisis or storm: Secondary | ICD-10-CM

## 2011-12-28 DIAGNOSIS — E0500 Thyrotoxicosis with diffuse goiter without thyrotoxic crisis or storm: Secondary | ICD-10-CM | POA: Insufficient documentation

## 2011-12-28 MED ORDER — SODIUM IODIDE I 131 CAPSULE
16.2000 | Freq: Once | INTRAVENOUS | Status: AC | PRN
  Administered 2011-12-28: 16.2 via ORAL

## 2012-01-02 ENCOUNTER — Telehealth: Payer: Self-pay

## 2012-01-02 DIAGNOSIS — E0590 Thyrotoxicosis, unspecified without thyrotoxic crisis or storm: Secondary | ICD-10-CM

## 2012-01-02 NOTE — Telephone Encounter (Signed)
Pt called requesting order to have labs done prior to return OV 10/11, please advise.

## 2012-01-02 NOTE — Telephone Encounter (Signed)
i ordered

## 2012-01-03 NOTE — Telephone Encounter (Signed)
Pt informed lab order placed and to come in via VM and to callback office with any questions/concerns.

## 2012-01-07 ENCOUNTER — Encounter: Payer: Self-pay | Admitting: Vascular Surgery

## 2012-01-08 ENCOUNTER — Ambulatory Visit: Payer: BC Managed Care – PPO | Admitting: Vascular Surgery

## 2012-02-07 ENCOUNTER — Other Ambulatory Visit (INDEPENDENT_AMBULATORY_CARE_PROVIDER_SITE_OTHER): Payer: BC Managed Care – PPO

## 2012-02-07 DIAGNOSIS — E0590 Thyrotoxicosis, unspecified without thyrotoxic crisis or storm: Secondary | ICD-10-CM

## 2012-02-07 LAB — TSH: TSH: 0 u[IU]/mL — ABNORMAL LOW (ref 0.35–5.50)

## 2012-02-07 LAB — T4, FREE: Free T4: 1.95 ng/dL — ABNORMAL HIGH (ref 0.60–1.60)

## 2012-02-08 ENCOUNTER — Ambulatory Visit (INDEPENDENT_AMBULATORY_CARE_PROVIDER_SITE_OTHER): Payer: BC Managed Care – PPO | Admitting: Endocrinology

## 2012-02-08 ENCOUNTER — Encounter: Payer: Self-pay | Admitting: Endocrinology

## 2012-02-08 VITALS — BP 136/70 | HR 68 | Temp 98.6°F | Wt 169.0 lb

## 2012-02-08 DIAGNOSIS — E0590 Thyrotoxicosis, unspecified without thyrotoxic crisis or storm: Secondary | ICD-10-CM

## 2012-02-08 NOTE — Patient Instructions (Addendum)
Your thyroid is not better yet, but it will get better with time Please come back for a follow-up appointment for 1 month.

## 2012-02-08 NOTE — Progress Notes (Signed)
  Subjective:    Patient ID: Jaime Rose, female    DOB: [birth year], 61 y.o.   MRN: [id number]  HPI Pt is 6 weeks s/p i-131 rx, for hyperthyroidism due to grave's dz.  pt states she feels no different,and well in general. Past Medical History  Diagnosis Date  . ALLERGIC RHINITIS   . Hyperthyroidism     normal TFTs 11/2009 , dx 10/2011    Past Surgical History  Procedure Date  . No past surgeries     History   Social History  . Marital Status: Single    Spouse Name: N/A    Number of Children: N/A  . Years of Education: N/A   Occupational History  . Not on file.   Social History Main Topics  . Smoking status: Former Smoker    Types: Cigarettes    Quit date: 04/30/1988  . Smokeless tobacco: Never Used   Comment: Singles, lives with 2 dtrs- [place] & [place]  . Alcohol Use: 0.0 - 0.5 oz/week    0-1 drink(s) per week  . Drug Use: No  . Sexually Active: Not on file   Other Topics Concern  . Not on file   Social History Narrative  . No narrative on file    Current Outpatient Prescriptions on File Prior to Visit  Medication Sig Dispense Refill  . clobetasol ointment (TEMOVATE) 0.05 % Apply topically 2 (two) times daily.  30 g  0  . cycloSPORINE (RESTASIS) 0.05 % ophthalmic emulsion 2 drops every morning.      . loratadine (CLARITIN) 10 MG tablet Take 10 mg by mouth as needed.         No Known Allergies  Family History  Problem Relation Age of Onset  . Heart disease Father   . Autoimmune disease Father   . Hypertension Father   . Other Father     varicose veins, AAA  . Hyperlipidemia Mother   . Other Mother     varicose veins, AAA  . Heart attack Mother   . Hyperlipidemia Sister    BP 136/70  Pulse 68  Temp 98.6 F (37 C) (Oral)  Wt 169 lb (76.658 kg)  SpO2 97%  Review of Systems Denies fever    Objective:   Physical Exam VITAL SIGNS:  See vs page. GENERAL: no distress. NECK: There is no palpable thyroid enlargement.  No thyroid nodule is  palpable.  No palpable lymphadenopathy at the anterior neck.    Lab Results  Component Value Date   TSH 0.00 Repeated and verified X2.* 02/07/2012   T4TOTAL 7.5 12/21/2009      Assessment & Plan:  Hyperthyroidism, not better yet

## 2012-03-13 ENCOUNTER — Other Ambulatory Visit (INDEPENDENT_AMBULATORY_CARE_PROVIDER_SITE_OTHER): Payer: BC Managed Care – PPO

## 2012-03-13 DIAGNOSIS — E0590 Thyrotoxicosis, unspecified without thyrotoxic crisis or storm: Secondary | ICD-10-CM

## 2012-03-17 ENCOUNTER — Encounter: Payer: Self-pay | Admitting: Endocrinology

## 2012-03-17 ENCOUNTER — Ambulatory Visit: Payer: BC Managed Care – PPO | Admitting: Internal Medicine

## 2012-03-17 ENCOUNTER — Ambulatory Visit (INDEPENDENT_AMBULATORY_CARE_PROVIDER_SITE_OTHER): Payer: BC Managed Care – PPO | Admitting: Endocrinology

## 2012-03-17 VITALS — BP 130/70 | HR 76 | Temp 97.7°F | Wt 173.0 lb

## 2012-03-17 DIAGNOSIS — E890 Postprocedural hypothyroidism: Secondary | ICD-10-CM

## 2012-03-17 MED ORDER — LEVOTHYROXINE SODIUM 125 MCG PO TABS: 125.0000 ug | ORAL_TABLET | Freq: Every day | ORAL | Status: DC

## 2012-03-17 NOTE — Patient Instructions (Addendum)
i have sent a prescription to your pharmacy, for a thyroid hormone pill.   Please come back for a follow-up appointment for 6 weeks.

## 2012-03-17 NOTE — Progress Notes (Signed)
  Subjective:    Patient ID: Jaime Rose, female    DOB: 09-24-1950, 61 y.o.   MRN: [id number]  HPI  Pt is almost 3 months s/p i-131 rx, for hyperthyroidism due to grave's dz.  pt states she feels well in general, except for weight gain.   Past Medical History  Diagnosis Date  . ALLERGIC RHINITIS   . Hyperthyroidism     normal TFTs 11/2009 , dx 10/2011    Past Surgical History  Procedure Date  . No past surgeries     History   Social History  . Marital Status: Single    Spouse Name: N/A    Number of Children: N/A  . Years of Education: N/A   Occupational History  . Not on file.   Social History Main Topics  . Smoking status: Former Smoker    Types: Cigarettes    Quit date: 04/30/1988  . Smokeless tobacco: Never Used     Comment: Singles, lives with 2 dtrs- [employer] [place] & [place]  . Alcohol Use: 0.0 - 0.5 oz/week    0-1 drink(s) per week  . Drug Use: No  . Sexually Active: Not on file   Other Topics Concern  . Not on file   Social History Narrative  . No narrative on file    Current Outpatient Prescriptions on File Prior to Visit  Medication Sig Dispense Refill  . clobetasol ointment (TEMOVATE) 0.05 % Apply topically 2 (two) times daily.  30 g  0  . cycloSPORINE (RESTASIS) 0.05 % ophthalmic emulsion 2 drops every morning.      . loratadine (CLARITIN) 10 MG tablet Take 10 mg by mouth as needed.         No Known Allergies  Family History  Problem Relation Age of Onset  . Heart disease Father   . Autoimmune disease Father   . Hypertension Father   . Other Father     varicose veins, AAA  . Hyperlipidemia Mother   . Other Mother     varicose veins, AAA  . Heart attack Mother   . Hyperlipidemia Sister    There were no vitals taken for this visit.  Review of Systems Denies fever    Objective:   Physical Exam VITAL SIGNS:  See vs page. GENERAL: no distress. NECK: There is no palpable thyroid enlargement.  No thyroid nodule is palpable.  No palpable  lymphadenopathy at the anterior neck.    Lab Results  Component Value Date   TSH 35.13* 03/13/2012   T4TOTAL 7.5 12/21/2009      Assessment & Plan:  Post-i-131 hypothyroidism, new.

## 2012-03-18 ENCOUNTER — Telehealth: Payer: Self-pay | Admitting: *Deleted

## 2012-03-18 NOTE — Telephone Encounter (Signed)
LEFT MESSAGE ON PATIENT CELL AND HOME # OF NEED TO CALL AND MAKE FOLLOW UP APPT. WITH DR. Everardo All CONCERNING LAB RESULT . NEW OFFICE NUMBER AND ADDRESS LEFT ON MESSAGE FOR PATIENT TO CALL.

## 2012-03-18 NOTE — Telephone Encounter (Signed)
Message copied by Elnora Morrison on Tue Mar 18, 2012  9:45 AM ------      Message from: Romero Belling      Created: Fri Mar 14, 2012 11:29 AM       please call patient:      Ov is due

## 2012-03-20 DIAGNOSIS — E039 Hypothyroidism, unspecified: Secondary | ICD-10-CM | POA: Insufficient documentation

## 2012-03-31 ENCOUNTER — Encounter: Payer: Self-pay | Admitting: Vascular Surgery

## 2012-04-01 ENCOUNTER — Encounter: Payer: Self-pay | Admitting: Vascular Surgery

## 2012-04-01 ENCOUNTER — Ambulatory Visit (INDEPENDENT_AMBULATORY_CARE_PROVIDER_SITE_OTHER): Payer: BC Managed Care – PPO | Admitting: Vascular Surgery

## 2012-04-01 VITALS — BP 150/81 | HR 67 | Resp 18 | Ht 66.0 in | Wt 177.4 lb

## 2012-04-01 DIAGNOSIS — I83893 Varicose veins of bilateral lower extremities with other complications: Secondary | ICD-10-CM

## 2012-04-01 NOTE — Progress Notes (Signed)
Problems with Activities of Daily Living Secondary to Leg Pain  1. 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  This is very difficult due to leg pain  2. Ms. Jaime Rose states that cooking, cleaning, shopping (activities that require prolonged standing) are very difficult due to leg pain.     Failure of  Conservative Therapy:  1. Worn 20-30 mm Hg thigh high compression hose >3 months with no relief of symptoms.  2. Frequently elevates legs-no relief of symptoms  3. Taken Ibuprofen 600 Mg TID with no relief of symptoms.  Patient continues to have discomfort in her right leg with prolonged standing. She has multiple venous issues. She does have reflux in her right saphenous vein throughout her thigh this does extend into a large plexus of telangiectasia on her pre-anterior thigh above her knee. This also extends into a large plexus varicosities in the pretibial area. She has less reflux in the left leg with multiple telangiectasias throughout.  I discussed the significance of this with the patient reimage her right vein was site. 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 She understands this is an outpatient procedure lasting approximately 1-1/2 hours. She wishes to proceed as soon as possible for symptom relief

## 2012-04-04 ENCOUNTER — Other Ambulatory Visit: Payer: Self-pay | Admitting: *Deleted

## 2012-04-04 DIAGNOSIS — I83893 Varicose veins of bilateral lower extremities with other complications: Secondary | ICD-10-CM

## 2012-04-10 ENCOUNTER — Encounter: Payer: Self-pay | Admitting: Vascular Surgery

## 2012-04-11 ENCOUNTER — Encounter: Payer: Self-pay | Admitting: Vascular Surgery

## 2012-04-11 ENCOUNTER — Ambulatory Visit (INDEPENDENT_AMBULATORY_CARE_PROVIDER_SITE_OTHER): Payer: BC Managed Care – PPO | Admitting: Vascular Surgery

## 2012-04-11 VITALS — BP 155/72 | HR 69 | Resp 18 | Ht 66.0 in | Wt 177.0 lb

## 2012-04-11 DIAGNOSIS — I83893 Varicose veins of bilateral lower extremities with other complications: Secondary | ICD-10-CM

## 2012-04-11 HISTORY — PX: ENDOVENOUS ABLATION SAPHENOUS VEIN W/ LASER: SUR449

## 2012-04-11 NOTE — Progress Notes (Signed)
Laser Ablation Procedure      Date: 04/11/2012    Cathi A Qadir DOB:March 10, 1951  Consent signed: Yes  Surgeon:T.F. Early  Procedure: Laser Ablation: right Greater Saphenous Vein (anterior branch)  BP 155/72  Pulse 69  Resp 18  Ht 5\' 6"  (1.676 m)  Wt 177 lb (80.287 kg)  BMI 28.57 kg/m2  Start time: 8:40am   End time: 9:45am  Tumescent Anesthesia: 300 cc 0.9% NaCl with 50 cc Lidocaine HCL with 1% Epi and 15 cc 8.4% NaHCO3  Local Anesthesia: 1 cc Lidocaine HCL and NaHCO3 (ratio 2:1)  Continuous Mode: 15 watts Total Energy 1373 Joules Total Time1:31   Sclerotherapy: 0.3 %Sotradecol. Patient received a total of 3 cc  Stab Phlebectomy: 10-20 Sites: Calf  Right leg  Patient tolerated procedure well: Yes    Description of Procedure:  After marking the course of the saphenous vein and the secondary varicosities in the standing position, the patient was placed on the operating table in the supine position, and the right leg was prepped and draped in sterile fashion. Local anesthetic was administered, and under ultrasound guidance the saphenous vein was accessed with a micro needle and guide wire; then the micro puncture sheath was placed. A guide wire was inserted to the saphenofemoral junction, followed by a 5 french sheath.  The position of the sheath and then the laser fiber below the junction was confirmed using the ultrasound and visualization of the aiming beam.  Tumescent anesthesia was administered along the course of the saphenous vein using ultrasound guidance. Protective laser glasses were placed on the patient, and the laser was fired at at 15 watt continuous mode.  For a total of 1373 joules.  A steri strip was applied to the puncture site.  The patient was then put into Trendelenburg position.  Local anesthetic was utilized overlying the marked varicosities.  Greater than 10-20 stab wounds were made using the tip of an 11 blade; and using the vein hook,  The phlebectomies  were performed using a hemostat to avulse these varicosities.  Adequate hemostasis was achieved, and steri strips were applied to the stab wound.    Sclerotherapy was performed at the 2 patches of telangiectasia one in the pretibial area and one in the area above her knee using 3  cc .3% Sotradecol foam via a 30 gaugeneedle.  ABD pads and thigh high compression stockings were applied.  Ace wrap bandages were applied over the phlebectomy sites and at the top of the saphenofemoral junction.  Blood loss was less than 15 cc.  The patient ambulated out of the operating room having tolerated the procedure well.  Uneventful ablation of right saphenous vein throughout her thigh. She also had phlebectomy of pretibial varicosities

## 2012-04-16 ENCOUNTER — Encounter: Payer: Self-pay | Admitting: Vascular Surgery

## 2012-04-17 ENCOUNTER — Encounter (INDEPENDENT_AMBULATORY_CARE_PROVIDER_SITE_OTHER): Payer: BC Managed Care – PPO | Admitting: *Deleted

## 2012-04-17 ENCOUNTER — Encounter: Payer: Self-pay | Admitting: Vascular Surgery

## 2012-04-17 ENCOUNTER — Ambulatory Visit (INDEPENDENT_AMBULATORY_CARE_PROVIDER_SITE_OTHER): Payer: BC Managed Care – PPO | Admitting: Vascular Surgery

## 2012-04-17 VITALS — BP 154/88 | HR 64 | Resp 18 | Ht 66.0 in | Wt 170.0 lb

## 2012-04-17 DIAGNOSIS — I83893 Varicose veins of bilateral lower extremities with other complications: Secondary | ICD-10-CM

## 2012-04-17 DIAGNOSIS — Z48812 Encounter for surgical aftercare following surgery on the circulatory system: Secondary | ICD-10-CM

## 2012-04-17 NOTE — Progress Notes (Signed)
Patient presents today one week status post laser ablation of her great saphenous vein more anterior branch of the right and also stab phlebectomy of tributary varicosities. She has a mild bruising in mild discomfort. This discomfort is related to ablation site in her thigh. Had sclerotherapy of several large nest of telangiectasia over pretibial area in her medial anterior thigh area.  On physical exam she does have mild bruising and no significant tenderness. She does have a good result initially from the sclerotherapy of her large telangiectasia.  Venous duplex today reveals closure of the treated branch of her saphenous vein and the distal insertion site to roughly 1 cm from the saphenofemoral junction. There is no evidence of DVT.  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 The patient will continue her compression garment for one additional week and then see [place] on an as-needed basis following this.

## 2012-05-05 ENCOUNTER — Other Ambulatory Visit: Payer: Self-pay | Admitting: Endocrinology

## 2012-05-05 ENCOUNTER — Other Ambulatory Visit (INDEPENDENT_AMBULATORY_CARE_PROVIDER_SITE_OTHER): Payer: BC Managed Care – PPO

## 2012-05-05 DIAGNOSIS — E890 Postprocedural hypothyroidism: Secondary | ICD-10-CM

## 2012-05-06 LAB — T4, FREE: Free T4: 1.06 ng/dL (ref 0.60–1.60)

## 2012-05-06 LAB — TSH: TSH: 3.81 u[IU]/mL (ref 0.35–5.50)

## 2012-05-07 ENCOUNTER — Encounter: Payer: Self-pay | Admitting: Endocrinology

## 2012-05-07 ENCOUNTER — Ambulatory Visit (INDEPENDENT_AMBULATORY_CARE_PROVIDER_SITE_OTHER): Payer: BC Managed Care – PPO | Admitting: Endocrinology

## 2012-05-07 VITALS — BP 124/70 | HR 66 | Temp 98.6°F | Wt 179.0 lb

## 2012-05-07 DIAGNOSIS — E890 Postprocedural hypothyroidism: Secondary | ICD-10-CM

## 2012-05-07 NOTE — Patient Instructions (Addendum)
Please continue the same thyroid medication. Please come back for a follow-up appointment in 3-4 months.

## 2012-05-07 NOTE — Progress Notes (Signed)
  Subjective:    Patient ID: Jaime Rose, female    DOB: [birth year], 62 y.o.   MRN: [id number]  HPI Pt is now 4 months s/p i-131 rx, for hyperthyroidism due to grave's dz.  pt states she feels well in general, except for weight gain.  She takes synthroid as rx'ed.   Past Medical History  Diagnosis Date  . ALLERGIC RHINITIS   . Hyperthyroidism     normal TFTs 11/2009 , dx 10/2011    Past Surgical History  Procedure Date  . No past surgeries   . Endovenous ablation saphenous vein w/ laser 04-11-2012    endovenous laser ablation right greater saphenous vein and stab phlebectomy  right leg  by Gretta Began MD    History   Social History  . Marital Status: Single    Spouse Name: N/A    Number of Children: N/A  . Years of Education: N/A   Occupational History  . Not on file.   Social History Main Topics  . Smoking status: Former Smoker    Types: Cigarettes    Quit date: 04/30/1988  . Smokeless tobacco: Never Used     Comment: Singles, lives with 2 dtrs- [employer] & [place]  . Alcohol Use: 0.0 - 0.5 oz/week    0-1 drink(s) per week  . Drug Use: No  . Sexually Active: Not on file   Other Topics Concern  . Not on file   Social History Narrative  . No narrative on file    Current Outpatient Prescriptions on File Prior to Visit  Medication Sig Dispense Refill  . clobetasol ointment (TEMOVATE) 0.05 % Apply topically 2 (two) times daily.  30 g  0  . cycloSPORINE (RESTASIS) 0.05 % ophthalmic emulsion 2 drops every morning.      Marland Kitchen levothyroxine (SYNTHROID, LEVOTHROID) 125 MCG tablet Take 1 tablet (125 mcg total) by mouth daily.  30 tablet  1  . loratadine (CLARITIN) 10 MG tablet Take 10 mg by mouth as needed.         No Known Allergies  Family History  Problem Relation Age of Onset  . Heart disease Father   . Autoimmune disease Father   . Hypertension Father   . Other Father     varicose veins, AAA  . Hyperlipidemia Mother   . Other Mother     varicose veins, AAA  .  Heart attack Mother   . Hyperlipidemia Sister     BP 124/70  Pulse 66  Temp 98.6 F (37 C) (Oral)  Wt 179 lb (81.194 kg)  SpO2 98%  Review of Systems She denies neck swelling    Objective:   Physical Exam VITAL SIGNS:  See vs page GENERAL: no distress NECK: There is no palpable thyroid enlargement.  No thyroid nodule is palpable.  No palpable lymphadenopathy at the anterior neck.  Lab Results  Component Value Date   TSH 3.81 05/05/2012   T4TOTAL 7.5 12/21/2009      Assessment & Plan:  Post-i-131 hypothyroidism, well-replaced

## 2012-05-23 ENCOUNTER — Other Ambulatory Visit: Payer: Self-pay

## 2012-05-23 ENCOUNTER — Telehealth: Payer: Self-pay

## 2012-05-23 MED ORDER — LEVOTHYROXINE SODIUM 125 MCG PO TABS: 125.0000 ug | ORAL_TABLET | Freq: Every day | ORAL | Status: DC

## 2012-05-23 NOTE — Telephone Encounter (Signed)
Pt just realized she is out of her thyroid med and requesting refill and call back when sent in.

## 2012-05-23 NOTE — Telephone Encounter (Signed)
Please refill prn 

## 2012-05-26 ENCOUNTER — Other Ambulatory Visit: Payer: Self-pay

## 2012-05-26 MED ORDER — LEVOTHYROXINE SODIUM 125 MCG PO TABS: 125.0000 ug | ORAL_TABLET | Freq: Every day | ORAL | Status: DC

## 2012-09-12 ENCOUNTER — Ambulatory Visit: Payer: BC Managed Care – PPO | Admitting: Endocrinology

## 2012-09-26 ENCOUNTER — Ambulatory Visit: Payer: BC Managed Care – PPO

## 2012-09-26 ENCOUNTER — Telehealth: Payer: Self-pay | Admitting: Endocrinology

## 2012-09-26 DIAGNOSIS — E890 Postprocedural hypothyroidism: Secondary | ICD-10-CM

## 2012-09-26 LAB — TSH: TSH: 0.77 u[IU]/mL (ref 0.35–5.50)

## 2012-09-26 NOTE — Telephone Encounter (Signed)
Pt would like to go for labs at [facility] today for thyroid. Advised pt that we are now drawing labs on the day of appt, in our office. Pt became very upset and refuses to do this. She says she will change practices if we do not allow her to have them drawn ahead of time. Pt is aware that we do not have a lab tech available today in our office, she is willing to go to [facility]. Wants to make sure order is in place before she goes. Please call [phone] / Roanna Raider

## 2012-09-26 NOTE — Telephone Encounter (Signed)
Ok, i ordered 

## 2012-09-26 NOTE — Telephone Encounter (Signed)
Pt advised.

## 2012-09-29 ENCOUNTER — Encounter: Payer: Self-pay | Admitting: Endocrinology

## 2012-09-29 ENCOUNTER — Ambulatory Visit (INDEPENDENT_AMBULATORY_CARE_PROVIDER_SITE_OTHER): Payer: BC Managed Care – PPO | Admitting: Endocrinology

## 2012-09-29 VITALS — BP 124/70 | HR 63 | Ht 66.0 in | Wt 181.0 lb

## 2012-09-29 DIAGNOSIS — E890 Postprocedural hypothyroidism: Secondary | ICD-10-CM

## 2012-09-29 NOTE — Patient Instructions (Addendum)
Please continue the same thyroid medication.  You will probably need this for the rest of your life.   I would be happy to see you back here whenever you want.   You should have your thyroid level checked at least once a year.

## 2012-09-29 NOTE — Progress Notes (Signed)
  Subjective:    Patient ID: Jaime Rose, female    DOB: 1950/12/31, 62 y.o.   MRN: [id number]  HPI Pt had i-131 rx, for hyperthyroidism due to grave's dz, in mid-2013.  She has been on synthroid since hypothyroidism developed a few mos later.  pt states she feels well in general, except for weight gain and fatigue.  She takes synthroid as rx'ed.   Past Medical History  Diagnosis Date  . ALLERGIC RHINITIS   . Hyperthyroidism     normal TFTs 11/2009 , dx 10/2011    Past Surgical History  Procedure Laterality Date  . No past surgeries    . Endovenous ablation saphenous vein w/ laser  04-11-2012    endovenous laser ablation right greater saphenous vein and stab phlebectomy  right leg  by Gretta Began MD    History   Social History  . Marital Status: Single    Spouse Name: N/A    Number of Children: N/A  . Years of Education: N/A   Occupational History  . Not on file.   Social History Main Topics  . Smoking status: Former Smoker    Types: Cigarettes    Quit date: 04/30/1988  . Smokeless tobacco: Never Used     Comment: Singles, lives with 2 dtrs- [employer] & [place]  . Alcohol Use: 0 - .5 oz/week    0-1 drink(s) per week  . Drug Use: No  . Sexually Active: Not on file   Other Topics Concern  . Not on file   Social History Narrative  . No narrative on file    Current Outpatient Prescriptions on File Prior to Visit  Medication Sig Dispense Refill  . clobetasol ointment (TEMOVATE) 0.05 % Apply topically 2 (two) times daily.  30 g  0  . cycloSPORINE (RESTASIS) 0.05 % ophthalmic emulsion 2 drops every morning.      Marland Kitchen levothyroxine (SYNTHROID, LEVOTHROID) 125 MCG tablet Take 1 tablet (125 mcg total) by mouth daily.  30 tablet  11  . loratadine (CLARITIN) 10 MG tablet Take 10 mg by mouth as needed.        No current facility-administered medications on file prior to visit.    No Known Allergies  Family History  Problem Relation Age of Onset  . Heart disease Father   .  Autoimmune disease Father   . Hypertension Father   . Other Father     varicose veins, AAA  . Hyperlipidemia Mother   . Other Mother     varicose veins, AAA  . Heart attack Mother   . Hyperlipidemia Sister    BP 124/70  Pulse 63  Ht 5\' 6"  (1.676 m)  Wt 181 lb (82.101 kg)  BMI 29.23 kg/m2  SpO2 98%  Review of Systems Denies tremor.     Objective:   Physical Exam VITAL SIGNS:  See vs page GENERAL: no distress Skin: not diaphoretic Neuro: no tremor.   Lab Results  Component Value Date   TSH 0.77 09/26/2012   T4TOTAL 7.5 12/21/2009      Assessment & Plan:  Post-i-131 hypothyroidism, well-replaced

## 2013-06-02 ENCOUNTER — Other Ambulatory Visit: Payer: Self-pay

## 2013-06-02 DIAGNOSIS — Z1231 Encounter for screening mammogram for malignant neoplasm of breast: Secondary | ICD-10-CM

## 2013-06-15 ENCOUNTER — Ambulatory Visit: Payer: BC Managed Care – PPO

## 2013-06-17 ENCOUNTER — Other Ambulatory Visit: Payer: Self-pay

## 2013-06-17 MED ORDER — LEVOTHYROXINE SODIUM 125 MCG PO TABS: 125.0000 ug | ORAL_TABLET | Freq: Every day | ORAL | Status: DC

## 2013-07-06 ENCOUNTER — Ambulatory Visit
Admission: RE | Admit: 2013-07-06 | Discharge: 2013-07-06 | Disposition: A | Discharge status: Home / Self Care | Payer: BC Managed Care – PPO | Source: Ambulatory Visit

## 2013-07-06 DIAGNOSIS — Z1231 Encounter for screening mammogram for malignant neoplasm of breast: Secondary | ICD-10-CM

## 2013-10-19 ENCOUNTER — Other Ambulatory Visit: Payer: Self-pay | Admitting: Internal Medicine

## 2013-11-23 ENCOUNTER — Other Ambulatory Visit: Payer: Self-pay

## 2013-11-23 MED ORDER — LEVOTHYROXINE SODIUM 125 MCG PO TABS
125.0000 ug | ORAL_TABLET | Freq: Every day | ORAL | Status: DC
Start: 2013-11-23 — End: 2014-01-27

## 2013-12-28 ENCOUNTER — Emergency Department (HOSPITAL_BASED_OUTPATIENT_CLINIC_OR_DEPARTMENT_OTHER)
Admission: EM | Admit: 2013-12-28 | Discharge: 2013-12-28 | Disposition: A | Discharge status: Home / Self Care | Payer: Worker's Compensation | Attending: Emergency Medicine | Admitting: Emergency Medicine

## 2013-12-28 ENCOUNTER — Emergency Department (HOSPITAL_BASED_OUTPATIENT_CLINIC_OR_DEPARTMENT_OTHER): Payer: BC Managed Care – PPO | Attending: Emergency Medicine

## 2013-12-28 ENCOUNTER — Encounter (HOSPITAL_BASED_OUTPATIENT_CLINIC_OR_DEPARTMENT_OTHER): Payer: Self-pay | Admitting: Emergency Medicine

## 2013-12-28 DIAGNOSIS — E0590 Thyrotoxicosis, unspecified without thyrotoxic crisis or storm: Secondary | ICD-10-CM | POA: Insufficient documentation

## 2013-12-28 DIAGNOSIS — M25569 Pain in unspecified knee: Secondary | ICD-10-CM | POA: Diagnosis not present

## 2013-12-28 DIAGNOSIS — M25562 Pain in left knee: Secondary | ICD-10-CM

## 2013-12-28 DIAGNOSIS — Z8709 Personal history of other diseases of the respiratory system: Secondary | ICD-10-CM | POA: Diagnosis not present

## 2013-12-28 DIAGNOSIS — IMO0002 Reserved for concepts with insufficient information to code with codable children: Secondary | ICD-10-CM | POA: Insufficient documentation

## 2013-12-28 DIAGNOSIS — Z87891 Personal history of nicotine dependence: Secondary | ICD-10-CM | POA: Insufficient documentation

## 2013-12-28 DIAGNOSIS — Z79899 Other long term (current) drug therapy: Secondary | ICD-10-CM | POA: Insufficient documentation

## 2013-12-28 DIAGNOSIS — G8911 Acute pain due to trauma: Secondary | ICD-10-CM | POA: Insufficient documentation

## 2013-12-28 IMAGING — CR DG KNEE COMPLETE 4+V*L*
4 series · 4 of 4 positions shown · non-contrast
Comparison: None.

CLINICAL DATA: New twisted left knee injury.  Knee pain.

EXAM:
LEFT KNEE - COMPLETE 4+ VIEW

[t knee ap left]
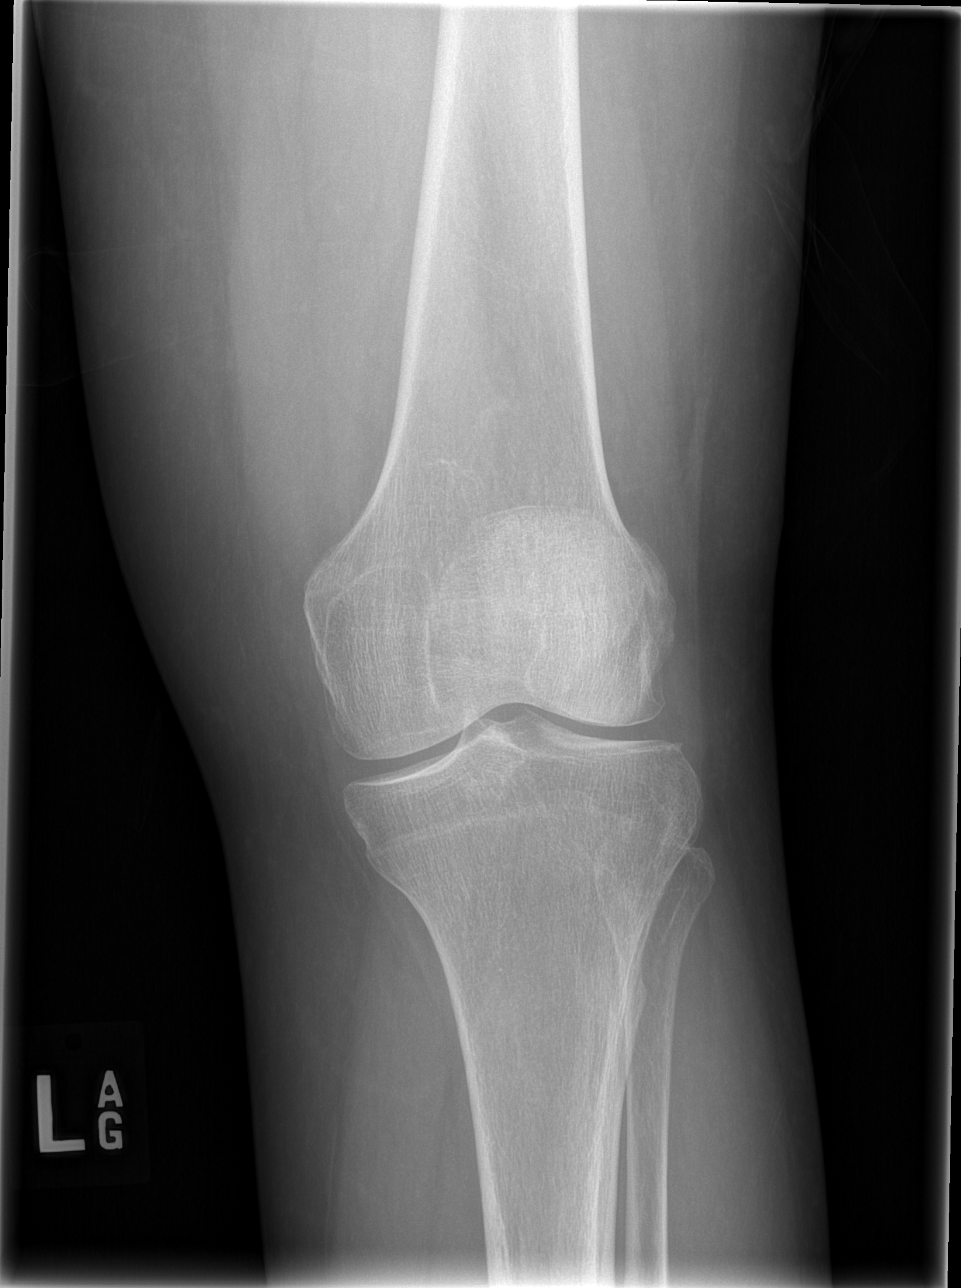

[t knee oblique left (1 of 2)]
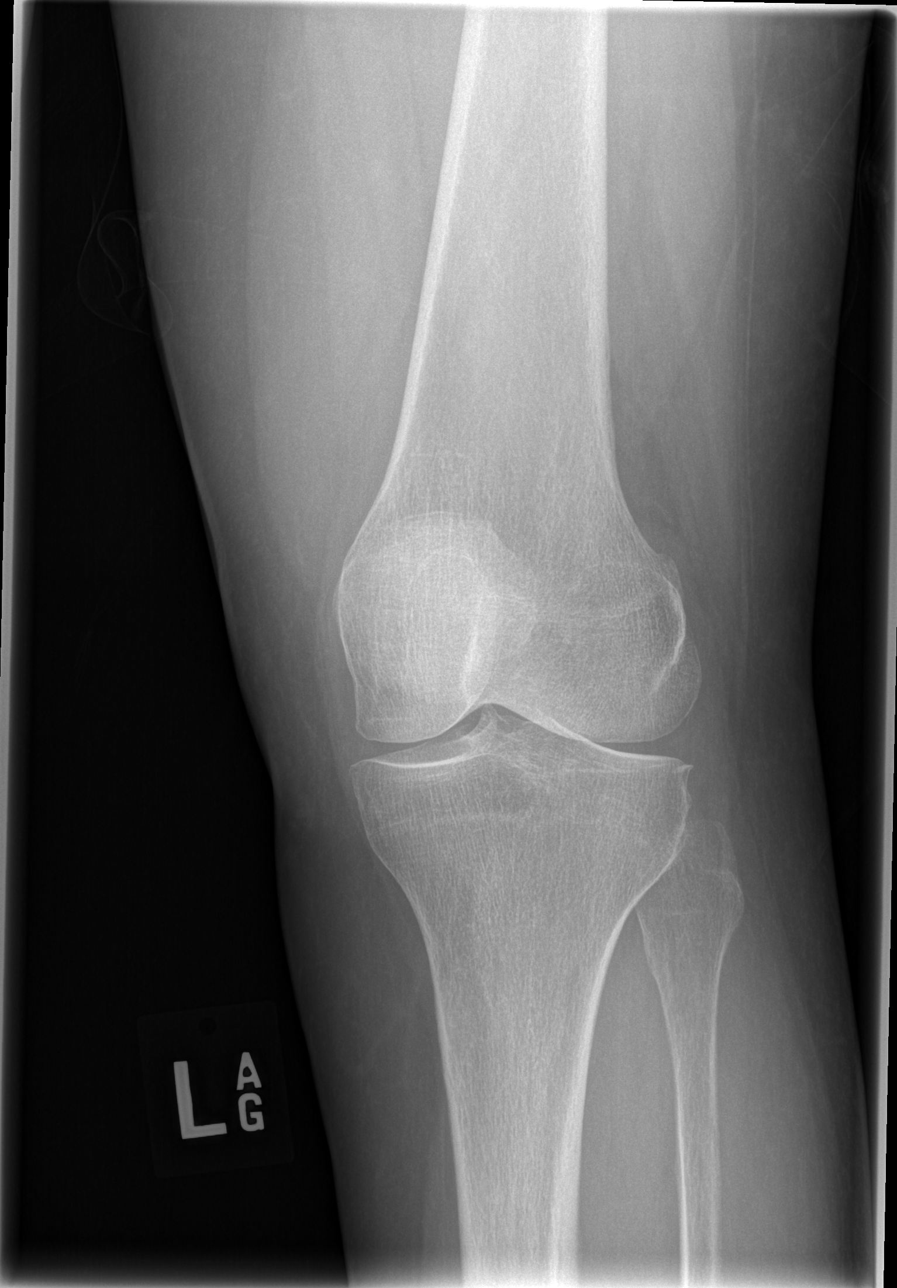

[t knee oblique left (2 of 2)]
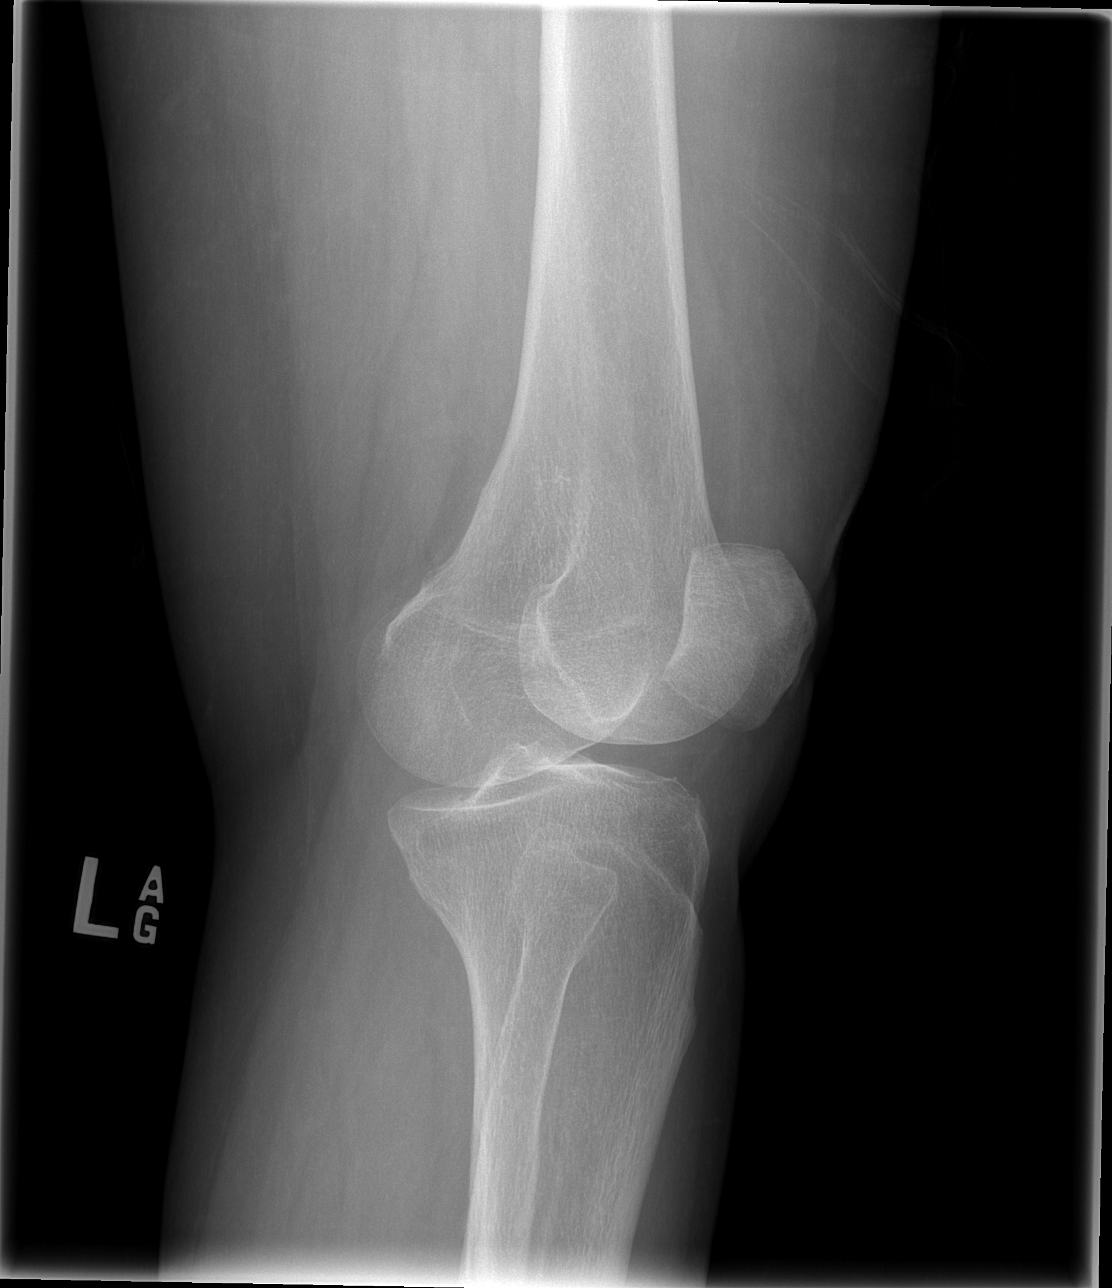

[t knee lat left]
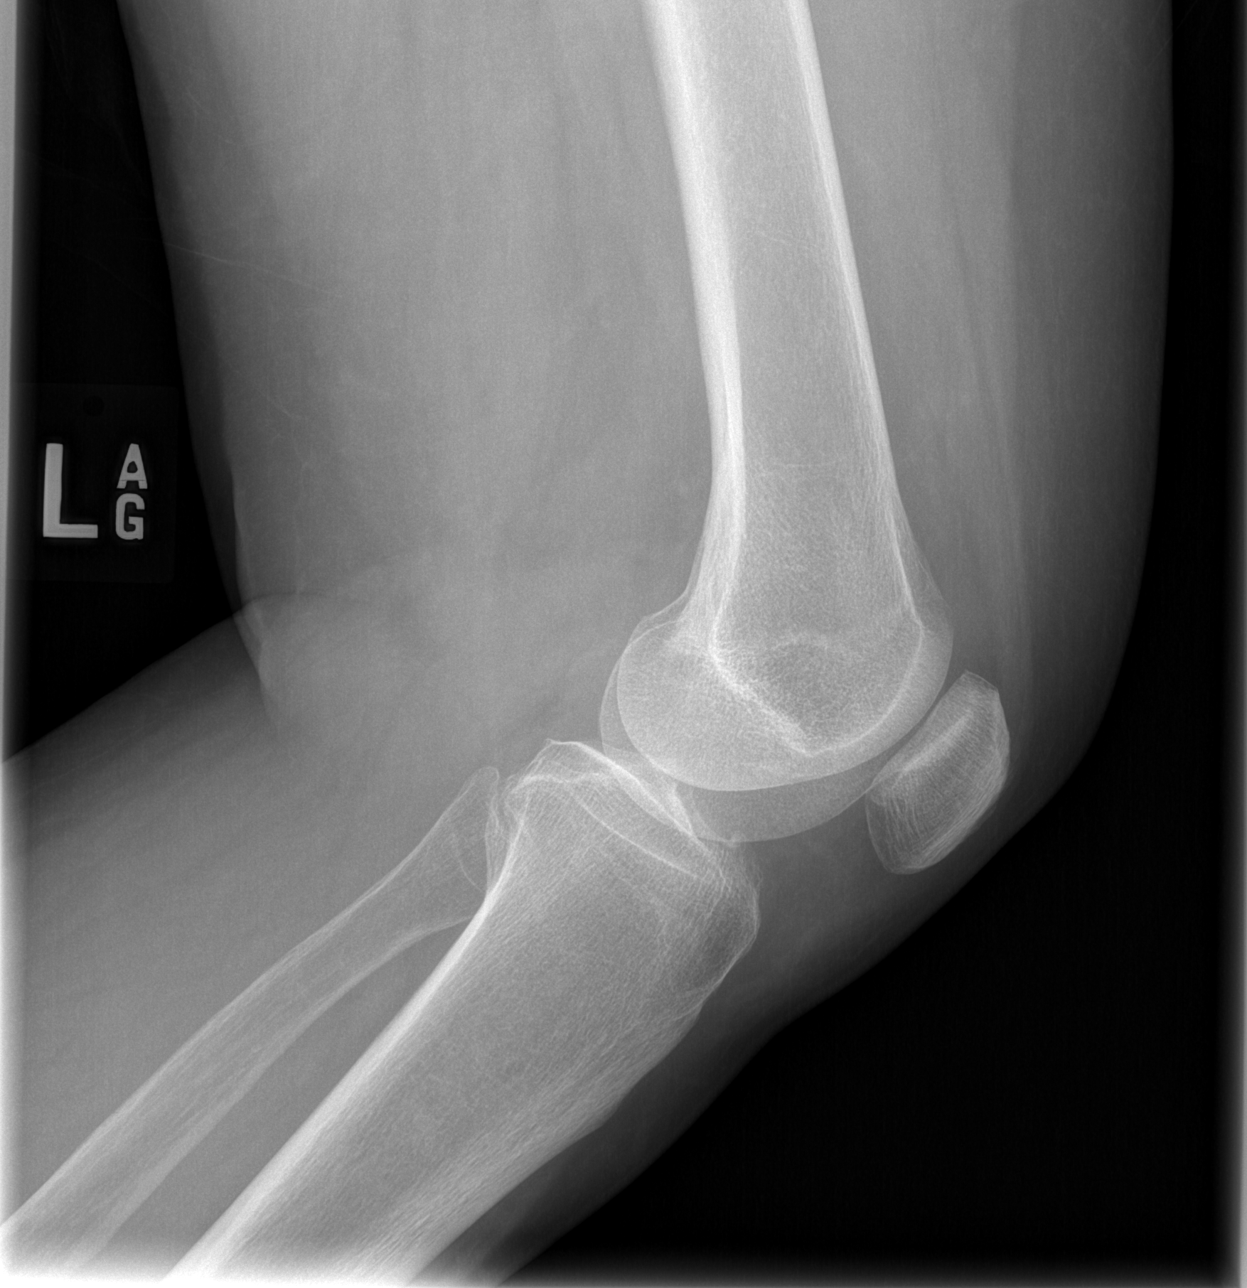

[4 of 4 positions shown; findings below may reference images not displayed]

FINDINGS: Minimal spurring of the lateral tibial plateau in the tibial spine.
Subtly reduced articular space in the medial compartment.

Otherwise negative exam.
IMPRESSION: 1. Minimal degenerative findings. Otherwise negative exam. If pain
persists despite conservative therapy, MRI may be warranted for
further characterization.

## 2013-12-28 MED ORDER — IBUPROFEN 800 MG PO TABS: 800.0000 mg | ORAL_TABLET | Freq: Three times a day (TID) | ORAL | Status: DC

## 2013-12-28 NOTE — ED Provider Notes (Signed)
CSN: [id number]     Arrival date & time 12/28/13  1933 History   First MD Initiated Contact with Patient 12/28/13 2129     Chief Complaint  Patient presents with  . Knee Pain     (Consider location/radiation/quality/duration/timing/severity/associated sxs/prior Treatment) Patient is a 63 y.o. female presenting with knee pain. The history is provided by the patient. No language interpreter was used.  Knee Pain Location:  Knee Knee location:  L knee Associated symptoms: no fever   Associated symptoms comment:  Twisting injury to knee several days ago with persistent pain with bending and stooping. No other injury.   Past Medical History  Diagnosis Date  . ALLERGIC RHINITIS   . Hyperthyroidism     normal TFTs 11/2009 , dx 10/2011   Past Surgical History  Procedure Laterality Date  . No past surgeries    . Endovenous ablation saphenous vein w/ laser  04-11-2012    endovenous laser ablation right greater saphenous vein and stab phlebectomy  right leg  by Gretta Began MD   Family History  Problem Relation Age of Onset  . Heart disease Father   . Autoimmune disease Father   . Hypertension Father   . Other Father     varicose veins, AAA  . Hyperlipidemia Mother   . Other Mother     varicose veins, AAA  . Heart attack Mother   . Hyperlipidemia Sister    History  Substance Use Topics  . Smoking status: Former Smoker    Types: Cigarettes    Quit date: 04/30/1988  . Smokeless tobacco: Never Used     Comment: Singles, lives with 2 dtrs- [employer] & [place]  . Alcohol Use: 0.0 - 0.5 oz/week    0-1 drink(s) per week   OB History   Grav Para Term Preterm Abortions TAB SAB Ect Mult Living                 Review of Systems  Constitutional: Negative for fever and chills.  Musculoskeletal: Positive for arthralgias.       See HPI.  Skin: Negative.  Negative for wound.  Neurological: Negative.  Negative for numbness.      Allergies  Review of patient's allergies indicates no  known allergies.  Home Medications   Prior to Admission medications   Medication Sig Start Date End Date Taking? Authorizing Provider  clobetasol ointment (TEMOVATE) 0.05 % apply to affected area twice a day 10/19/13   Newt Lukes, MD  cycloSPORINE (RESTASIS) 0.05 % ophthalmic emulsion 2 drops every morning.    Historical Provider, MD  levothyroxine (SYNTHROID, LEVOTHROID) 125 MCG tablet Take 1 tablet (125 mcg total) by mouth daily. 11/23/13   Romero Belling, MD  loratadine (CLARITIN) 10 MG tablet Take 10 mg by mouth as needed.     Historical Provider, MD   BP 152/69  Pulse 60  Temp(Src) 98.4 F (36.9 C) (Oral)  Resp 20  Ht  (1.676 m)  Wt 170 lb (77.111 kg)  BMI 27.45 kg/m2  SpO2 98% Physical Exam  Constitutional: She is oriented to person, place, and time. She appears well-developed and well-nourished. No distress.  Cardiovascular: Intact distal pulses.   Musculoskeletal:  Left knee is unremarkable in appearance. No significant swelling. Joint is stable without laxity. Positive McMurrays test, greater on valgus positioning.  Neurological: She is alert and oriented to person, place, and time.    ED Course  Procedures (including critical care time) Labs Review Labs Reviewed - No  data to display  Imaging Review Dg Knee Complete 4 Views Left  12/28/2013   CLINICAL DATA:  New twisted left knee injury.  Knee pain.  EXAM: LEFT KNEE - COMPLETE 4+ VIEW  COMPARISON:  None.  FINDINGS: Minimal spurring of the lateral tibial plateau in the tibial spine. Subtly reduced articular space in the medial compartment.  Otherwise negative exam.  IMPRESSION: 1. Minimal degenerative findings. Otherwise negative exam. If pain persists despite conservative therapy, MRI may be warranted for further characterization.   Electronically Signed   By: Herbie Baltimore M.D.   On: 12/28/2013 20:03     EKG Interpretation None      MDM   Final diagnoses:  None    1. Knee pain, left  Suspect  meniscal injury to left knee. Knee immobilizer provided - she is fully weight bearing and comfortable so no crutches were needed. Ortho f/u if symptoms persist.     Arnoldo Hooker, PA-C 12/28/13 2207

## 2013-12-28 NOTE — Discharge Instructions (Signed)
Cryotherapy °Cryotherapy means treatment with cold. Ice or gel packs can be used to reduce both pain and swelling. Ice is the most helpful within the first 24 to 48 hours after an injury or flare-up from overusing a muscle or joint. Sprains, strains, spasms, burning pain, shooting pain, and aches can all be eased with ice. Ice can also be used when recovering from surgery. Ice is effective, has very few side effects, and is safe for most people to use. °PRECAUTIONS  °Ice is not a safe treatment option for people with: °· Raynaud phenomenon. This is a condition affecting small blood vessels in the extremities. Exposure to cold may cause your problems to return. °· Cold hypersensitivity. There are many forms of cold hypersensitivity, including: °¨ Cold urticaria. Red, itchy hives appear on the skin when the tissues begin to warm after being iced. °¨ Cold erythema. This is a red, itchy rash caused by exposure to cold. °¨ Cold hemoglobinuria. Red blood cells break down when the tissues begin to warm after being iced. The hemoglobin that carry oxygen are passed into the urine because they cannot combine with blood proteins fast enough. °· Numbness or altered sensitivity in the area being iced. °If you have any of the following conditions, do not use ice until you have discussed cryotherapy with your caregiver: °· Heart conditions, such as arrhythmia, angina, or chronic heart disease. °· High blood pressure. °· Healing wounds or open skin in the area being iced. °· Current infections. °· Rheumatoid arthritis. °· Poor circulation. °· Diabetes. °Ice slows the blood flow in the region it is applied. This is beneficial when trying to stop inflamed tissues from spreading irritating chemicals to surrounding tissues. However, if you expose your skin to cold temperatures for too long or without the proper protection, you can damage your skin or nerves. Watch for signs of skin damage due to cold. °HOME CARE INSTRUCTIONS °Follow  these tips to use ice and cold packs safely. °· Place a dry or damp towel between the ice and skin. A damp towel will cool the skin more quickly, so you may need to shorten the time that the ice is used. °· For a more rapid response, add gentle compression to the ice. °· Ice for no more than 10 to 20 minutes at a time. The bonier the area you are icing, the less time it will take to get the benefits of ice. °· Check your skin after 5 minutes to make sure there are no signs of a poor response to cold or skin damage. °· Rest 20 minutes or more between uses. °· Once your skin is numb, you can end your treatment. You can test numbness by very lightly touching your skin. The touch should be so light that you do not see the skin dimple from the pressure of your fingertip. When using ice, most people will feel these normal sensations in this order: cold, burning, aching, and numbness. °· Do not use ice on someone who cannot communicate their responses to pain, such as small children or people with dementia. °HOW TO MAKE AN ICE PACK °Ice packs are the most common way to use ice therapy. Other methods include ice massage, ice baths, and cryosprays. Muscle creams that cause a cold, tingly feeling do not offer the same benefits that ice offers and should not be used as a substitute unless recommended by your caregiver. °To make an ice pack, do one of the following: °· Place crushed ice or a   bag of frozen vegetables in a sealable plastic bag. Squeeze out the excess air. Place this bag inside another plastic bag. Slide the bag into a pillowcase or place a damp towel between your skin and the bag. °· Mix 3 parts water with 1 part rubbing alcohol. Freeze the mixture in a sealable plastic bag. When you remove the mixture from the freezer, it will be slushy. Squeeze out the excess air. Place this bag inside another plastic bag. Slide the bag into a pillowcase or place a damp towel between your skin and the bag. °SEEK MEDICAL CARE  IF: °· You develop white spots on your skin. This may give the skin a blotchy (mottled) appearance. °· Your skin turns blue or pale. °· Your skin becomes waxy or hard. °· Your swelling gets worse. °MAKE SURE YOU:  °· Understand these instructions. °· Will watch your condition. °· Will get help right away if you are not doing well or get worse. °Document Released: 12/11/2010 Document Revised: 08/31/2013 Document Reviewed: 12/11/2010 °[employer]® Patient Information ©2015 [employer]. This information is not intended to replace advice given to you by your health care provider. Make sure you discuss any questions you have with your health care provider. ° °

## 2013-12-28 NOTE — ED Notes (Signed)
Pain in her left knee. She pushed a desk at work using her knee and has had the pain since.

## 2013-12-30 NOTE — ED Provider Notes (Signed)
Medical screening examination/treatment/procedure(s) were performed by non-physician practitioner and as supervising physician I was immediately available for consultation/collaboration.    Darrell Hauk L Pratik Dalziel, MD 12/30/13 [id number] 

## 2014-01-12 ENCOUNTER — Telehealth (HOSPITAL_BASED_OUTPATIENT_CLINIC_OR_DEPARTMENT_OTHER): Payer: Self-pay | Admitting: *Deleted

## 2014-01-27 ENCOUNTER — Other Ambulatory Visit (INDEPENDENT_AMBULATORY_CARE_PROVIDER_SITE_OTHER): Payer: BC Managed Care – PPO

## 2014-01-27 ENCOUNTER — Ambulatory Visit (INDEPENDENT_AMBULATORY_CARE_PROVIDER_SITE_OTHER): Payer: BC Managed Care – PPO | Admitting: Internal Medicine

## 2014-01-27 ENCOUNTER — Encounter: Payer: Self-pay | Admitting: Internal Medicine

## 2014-01-27 VITALS — BP 110/80 | HR 73 | Temp 98.3°F | Ht 66.0 in | Wt 176.5 lb

## 2014-01-27 DIAGNOSIS — Z1211 Encounter for screening for malignant neoplasm of colon: Secondary | ICD-10-CM

## 2014-01-27 DIAGNOSIS — Z0000 Encounter for general adult medical examination without abnormal findings: Secondary | ICD-10-CM

## 2014-01-27 DIAGNOSIS — E890 Postprocedural hypothyroidism: Secondary | ICD-10-CM

## 2014-01-27 DIAGNOSIS — B351 Tinea unguium: Secondary | ICD-10-CM

## 2014-01-27 LAB — URINALYSIS, ROUTINE W REFLEX MICROSCOPIC
Bilirubin Urine: NEGATIVE
Hgb urine dipstick: NEGATIVE
KETONES UR: NEGATIVE
LEUKOCYTES UA: NEGATIVE
NITRITE: NEGATIVE
PH: 7 (ref 5.0–8.0)
Total Protein, Urine: NEGATIVE
UROBILINOGEN UA: 0.2 (ref 0.0–1.0)
Urine Glucose: NEGATIVE

## 2014-01-27 LAB — BASIC METABOLIC PANEL
BUN: 19 mg/dL (ref 6–23)
CALCIUM: 9.5 mg/dL (ref 8.4–10.5)
CO2: 26 mEq/L (ref 19–32)
CREATININE: 0.8 mg/dL (ref 0.4–1.2)
Chloride: 106 mEq/L (ref 96–112)
GFR: 76.37 mL/min (ref >60.00)
GLUCOSE: 97 mg/dL (ref 70–99)
Potassium: 4.3 mEq/L (ref 3.5–5.1)
SODIUM: 140 meq/L (ref 135–145)

## 2014-01-27 LAB — CBC WITH DIFFERENTIAL/PLATELET
Basophils Absolute: 0 10*3/uL (ref 0.0–0.1)
Basophils Relative: 0.4 % (ref 0.0–3.0)
Eosinophils Absolute: 0.2 10*3/uL (ref 0.0–0.7)
Eosinophils Relative: 3 % (ref 0.0–5.0)
HCT: 39 % (ref 36.0–46.0)
HEMOGLOBIN: 13 g/dL (ref 12.0–15.0)
LYMPHS PCT: 24.4 % (ref 12.0–46.0)
Lymphs Abs: 1.6 10*3/uL (ref 0.7–4.0)
MCHC: 33.4 g/dL (ref 30.0–36.0)
MCV: 88.3 fl (ref 78.0–100.0)
MONOS PCT: 7.8 % (ref 3.0–12.0)
Monocytes Absolute: 0.5 10*3/uL (ref 0.1–1.0)
NEUTROS PCT: 64.4 % (ref 43.0–77.0)
Neutro Abs: 4.2 10*3/uL (ref 1.4–7.7)
PLATELETS: 357 10*3/uL (ref 150.0–400.0)
RBC: 4.41 Mil/uL (ref 3.87–5.11)
RDW: 13.5 % (ref 11.5–15.5)
WBC: 6.5 10*3/uL (ref 4.0–10.5)

## 2014-01-27 LAB — LIPID PANEL
CHOLESTEROL: 216 mg/dL — AB (ref 0–200)
HDL: 85.9 mg/dL (ref >39.00)
LDL Cholesterol: 117 mg/dL — ABNORMAL HIGH (ref 0–99)
NONHDL: 130.1
Total CHOL/HDL Ratio: 3
Triglycerides: 65 mg/dL (ref 0.0–149.0)
VLDL: 13 mg/dL (ref 0.0–40.0)

## 2014-01-27 LAB — HEPATIC FUNCTION PANEL
ALK PHOS: 72 U/L (ref 39–117)
ALT: 13 U/L (ref 0–35)
AST: 17 U/L (ref 0–37)
Albumin: 4 g/dL (ref 3.5–5.2)
BILIRUBIN DIRECT: 0.1 mg/dL (ref 0.0–0.3)
BILIRUBIN TOTAL: 0.6 mg/dL (ref 0.2–1.2)
Total Protein: 7.9 g/dL (ref 6.0–8.3)

## 2014-01-27 LAB — TSH: TSH: 1 u[IU]/mL (ref 0.35–4.50)

## 2014-01-27 MED ORDER — LEVOTHYROXINE SODIUM 125 MCG PO TABS: 125.0000 ug | ORAL_TABLET | Freq: Every day | ORAL | Status: DC

## 2014-01-27 NOTE — Patient Instructions (Addendum)
It was good to see you today.  We have reviewed your prior records including labs and tests today  Flu vaccine next month at work as discussed. Shingles vaccine administered today  Other Health Maintenance reviewed - all recommended immunizations and age-appropriate screenings are up-to-date.  We'll have you screened for colon cancer with COLOGAURD (stool DNA testing) - this packet will be sent to your home by the testing company. Complete instructions as in the box, and we will contact you with the results once available.  Test(s) ordered today. Your results will be released to MyChart (or called to you) after review, usually within 72hours after test completion. If any changes need to be made, you will be notified at that same time.  Medications reviewed and updated, no changes recommended at this time. Refill on medication(s) as discussed today.  we'll make referral to podiatry for her toenail fungal infection . Our office will contact you regarding appointment(s) once made.  Please schedule followup in 12 months for annual exam and labs, call sooner if problems.   Health Maintenance Adopting a healthy lifestyle and getting preventive care can go a long way to promote health and wellness. Talk with your health care provider about what schedule of regular examinations is right for you. This is a good chance for you to check in with your provider about disease prevention and staying healthy. In between checkups, there are plenty of things you can do on your own. Experts have done a lot of research about which lifestyle changes and preventive measures are most likely to keep you healthy. Ask your health care provider for more information. WEIGHT AND DIET  Eat a healthy diet  Be sure to include plenty of vegetables, fruits, low-fat dairy products, and lean protein.  Do not eat a lot of foods high in solid fats, added sugars, or salt.  Get regular exercise. This is one of the most  important things you can do for your health.  Most adults should exercise for at least 150 minutes each week. The exercise should increase your heart rate and make you sweat (moderate-intensity exercise).  Most adults should also do strengthening exercises at least twice a week. This is in addition to the moderate-intensity exercise.  Maintain a healthy weight  Body mass index (BMI) is a measurement that can be used to identify possible weight problems. It estimates body fat based on height and weight. Your health care provider can help determine your BMI and help you achieve or maintain a healthy weight.  For females 20 years of age and older:   A BMI below 18.5 is considered underweight.  A BMI of 18.5 to 24.9 is normal.  A BMI of 25 to 29.9 is considered overweight.  A BMI of 30 and above is considered obese.  Watch levels of cholesterol and blood lipids  You should start having your blood tested for lipids and cholesterol at 61 years of age, then have this test every 5 years.  You may need to have your cholesterol levels checked more often if:  Your lipid or cholesterol levels are high.  You are older than 61 years of age.  You are at high risk for heart disease.  CANCER SCREENING   Lung Cancer  Lung cancer screening is recommended for adults 55-80 years old who are at high risk for lung cancer because of a history of smoking.  A yearly low-dose CT scan of the lungs is recommended for people who:  Currently smoke.    Have quit within the past 15 years.  Have at least a 30-pack-year history of smoking. A pack year is smoking an average of one pack of cigarettes a day for 1 year.  Yearly screening should continue until it has been 15 years since you quit.  Yearly screening should stop if you develop a health problem that would prevent you from having lung cancer treatment.  Breast Cancer  Practice breast self-awareness. This means understanding how your breasts  normally appear and feel.  It also means doing regular breast self-exams. Let your health care provider know about any changes, no matter how small.  If you are in your 20s or 30s, you should have a clinical breast exam (CBE) by a health care provider every 1-3 years as part of a regular health exam.  If you are 40 or older, have a CBE every year. Also consider having a breast X-ray (mammogram) every year.  If you have a family history of breast cancer, talk to your health care provider about genetic screening.  If you are at high risk for breast cancer, talk to your health care provider about having an MRI and a mammogram every year.  Breast cancer gene (BRCA) assessment is recommended for women who have family members with BRCA-related cancers. BRCA-related cancers include:  Breast.  Ovarian.  Tubal.  Peritoneal cancers.  Results of the assessment will determine the need for genetic counseling and BRCA1 and BRCA2 testing. Cervical Cancer Routine pelvic examinations to screen for cervical cancer are no longer recommended for nonpregnant women who are considered low risk for cancer of the pelvic organs (ovaries, uterus, and vagina) and who do not have symptoms. A pelvic examination may be necessary if you have symptoms including those associated with pelvic infections. Ask your health care provider if a screening pelvic exam is right for you.   The Pap test is the screening test for cervical cancer for women who are considered at risk.  If you had a hysterectomy for a problem that was not cancer or a condition that could lead to cancer, then you no longer need Pap tests.  If you are older than 65 years, and you have had normal Pap tests for the past 10 years, you no longer need to have Pap tests.  If you have had past treatment for cervical cancer or a condition that could lead to cancer, you need Pap tests and screening for cancer for at least 20 years after your treatment.  If you  no longer get a Pap test, assess your risk factors if they change (such as having a new sexual partner). This can affect whether you should start being screened again.  Some women have medical problems that increase their chance of getting cervical cancer. If this is the case for you, your health care provider may recommend more frequent screening and Pap tests.  The human papillomavirus (HPV) test is another test that may be used for cervical cancer screening. The HPV test looks for the virus that can cause cell changes in the cervix. The cells collected during the Pap test can be tested for HPV.  The HPV test can be used to screen women 30 years of age and older. Getting tested for HPV can extend the interval between normal Pap tests from three to five years.  An HPV test also should be used to screen women of any age who have unclear Pap test results.  After 61 years of age, women should have HPV   testing as often as Pap tests.  Colorectal Cancer  This type of cancer can be detected and often prevented.  Routine colorectal cancer screening usually begins at 61 years of age and continues through 61 years of age.  Your health care provider may recommend screening at an earlier age if you have risk factors for colon cancer.  Your health care provider may also recommend using home test kits to check for hidden blood in the stool.  A small camera at the end of a tube can be used to examine your colon directly (sigmoidoscopy or colonoscopy). This is done to check for the earliest forms of colorectal cancer.  Routine screening usually begins at age 53.  Direct examination of the colon should be repeated every 5-10 years through 61 years of age. However, you may need to be screened more often if early forms of precancerous polyps or small growths are found. Skin Cancer  Check your skin from head to toe regularly.  Tell your health care provider about any new moles or changes in moles,  especially if there is a change in a mole's shape or color.  Also tell your health care provider if you have a mole that is larger than the size of a pencil eraser.  Always use sunscreen. Apply sunscreen liberally and repeatedly throughout the day.  Protect yourself by wearing long sleeves, pants, a wide-brimmed hat, and sunglasses whenever you are outside. HEART DISEASE, DIABETES, AND HIGH BLOOD PRESSURE   Have your blood pressure checked at least every 1-2 years. High blood pressure causes heart disease and increases the risk of stroke.  If you are between 11 years and 32 years old, ask your health care provider if you should take aspirin to prevent strokes.  Have regular diabetes screenings. This involves taking a blood sample to check your fasting blood sugar level.  If you are at a normal weight and have a low risk for diabetes, have this test once every three years after 61 years of age.  If you are overweight and have a high risk for diabetes, consider being tested at a younger age or more often. PREVENTING INFECTION  Hepatitis B  If you have a higher risk for hepatitis B, you should be screened for this virus. You are considered at high risk for hepatitis B if:  You were born in a country where hepatitis B is common. Ask your health care provider which countries are considered high risk.  Your parents were born in a high-risk country, and you have not been immunized against hepatitis B (hepatitis B vaccine).  You have HIV or AIDS.  You use needles to inject street drugs.  You live with someone who has hepatitis B.  You have had sex with someone who has hepatitis B.  You get hemodialysis treatment.  You take certain medicines for conditions, including cancer, organ transplantation, and autoimmune conditions. Hepatitis C  Blood testing is recommended for:  Everyone born from [birth year] through 1963.  Anyone with known risk factors for hepatitis C. Sexually transmitted  infections (STIs)  You should be screened for sexually transmitted infections (STIs) including gonorrhea and chlamydia if:  You are sexually active and are younger than 61 years of age.  You are older than 61 years of age and your health care provider tells you that you are at risk for this type of infection.  Your sexual activity has changed since you were last screened and you are at an increased risk for chlamydia  or gonorrhea. Ask your health care provider if you are at risk.  If you do not have HIV, but are at risk, it may be recommended that you take a prescription medicine daily to prevent HIV infection. This is called pre-exposure prophylaxis (PrEP). You are considered at risk if:  You are sexually active and do not regularly use condoms or know the HIV status of your partner(s).  You take drugs by injection.  You are sexually active with a partner who has HIV. Talk with your health care provider about whether you are at high risk of being infected with HIV. If you choose to begin PrEP, you should first be tested for HIV. You should then be tested every 3 months for as long as you are taking PrEP.  PREGNANCY   If you are premenopausal and you may become pregnant, ask your health care provider about preconception counseling.  If you may become pregnant, take 400 to 800 micrograms (mcg) of folic acid every day.  If you want to prevent pregnancy, talk to your health care provider about birth control (contraception). OSTEOPOROSIS AND MENOPAUSE   Osteoporosis is a disease in which the bones lose minerals and strength with aging. This can result in serious bone fractures. Your risk for osteoporosis can be identified using a bone density scan.  If you are 70 years of age or older, or if you are at risk for osteoporosis and fractures, ask your health care provider if you should be screened.  Ask your health care provider whether you should take a calcium or vitamin D supplement to  lower your risk for osteoporosis.  Menopause may have certain physical symptoms and risks.  Hormone replacement therapy may reduce some of these symptoms and risks. Talk to your health care provider about whether hormone replacement therapy is right for you.  HOME CARE INSTRUCTIONS   Schedule regular health, dental, and eye exams.  Stay current with your immunizations.   Do not use any tobacco products including cigarettes, chewing tobacco, or electronic cigarettes.  If you are pregnant, do not drink alcohol.  If you are breastfeeding, limit how much and how often you drink alcohol.  Limit alcohol intake to no more than 1 drink per day for nonpregnant women. One drink equals 12 ounces of beer, 5 ounces of wine, or 1 ounces of hard liquor.  Do not use street drugs.  Do not share needles.  Ask your health care provider for help if you need support or information about quitting drugs.  Tell your health care provider if you often feel depressed.  Tell your health care provider if you have ever been abused or do not feel safe at home. Document Released: 10/30/2010 Document Revised: 08/31/2013 Document Reviewed: 03/18/2013 [facility] Patient Information 2015 [place], [place]. This information is not intended to replace advice given to you by your health care provider. Make sure you discuss any questions you have with your health care provider.

## 2014-01-27 NOTE — Assessment & Plan Note (Signed)
Check TSH and adjust dose as needed Follows with endo prn Lab Results  Component Value Date   TSH 0.77 09/26/2012

## 2014-01-27 NOTE — Progress Notes (Signed)
Pre visit review using our clinic review tool, if applicable. No additional management support is needed unless otherwise documented below in the visit note. 

## 2014-01-27 NOTE — Progress Notes (Signed)
Subjective:    Patient ID: Jaime Rose, female    DOB: 08-May-1950, 63 y.o.   MRN: [id number]  HPI Last OV with me 10/2011 patient is here today for annual physical. Patient feels well overall Also reviewed chronic medical issues and interval medical events  Past Medical History  Diagnosis Date  . ALLERGIC RHINITIS   . Hyperthyroidism     normal TFTs 11/2009 , dx 10/2011   Family History  Problem Relation Age of Onset  . Heart disease Father   . Autoimmune disease Father   . Hypertension Father   . Other Father     varicose veins, AAA  . Hyperlipidemia Mother   . Other Mother     varicose veins, AAA  . Heart attack Mother   . Hyperlipidemia Sister    History  Substance Use Topics  . Smoking status: Former Smoker    Types: Cigarettes    Quit date: 04/30/1988  . Smokeless tobacco: Never Used  . Alcohol Use: 0.0 - 0.5 oz/week    0-1 drink(s) per week    Review of Systems  Constitutional: Negative for fatigue and unexpected weight change.  Respiratory: Negative for cough, shortness of breath and wheezing.   Cardiovascular: Negative for chest pain, palpitations and leg swelling.  Gastrointestinal: Negative for nausea, abdominal pain and diarrhea.  Skin:       R great toenail with thickening, unable to trim  Neurological: Negative for dizziness, weakness, light-headedness and headaches.  Psychiatric/Behavioral: Negative for dysphoric mood. The patient is not nervous/anxious.   All other systems reviewed and are negative.      Objective:   Physical Exam  BP 110/80  Pulse 73  Temp(Src) 98.3 F (36.8 C) (Oral)  Ht 5\' 6"  (1.676 m)  Wt 176 lb 8 oz (80.06 kg)  BMI 28.50 kg/m2  SpO2 95% Wt Readings from Last 3 Encounters:  01/27/14 176 lb 8 oz (80.06 kg)  12/28/13 170 lb (77.111 kg)  09/29/12 181 lb (82.101 kg)   Constitutional: She is overweight, appears well-developed and well-nourished. No distress.  HENT: Head: Normocephalic and atraumatic. Ears: B TMs  ok, no erythema or effusion; Nose: Nose normal. Mouth/Throat: Oropharynx is clear and moist. No oropharyngeal exudate.  Eyes: Conjunctivae and EOM are normal. Pupils are equal, round, and reactive to light. No scleral icterus.  Neck: Normal range of motion. Neck supple. No JVD present. No thyromegaly present.  Cardiovascular: Normal rate, regular rhythm and normal heart sounds.  No murmur heard. No BLE edema. Pulmonary/Chest: Effort normal and breath sounds normal. No respiratory distress. She has no wheezes.  Abdominal: Soft. Bowel sounds are normal. She exhibits no distension. There is no tenderness. no masses GU/breast: defer to gyn Musculoskeletal: Normal range of motion, no joint effusions. No gross deformities Neurological: She is alert and oriented to person, place, and time. No cranial nerve deficit. Coordination, balance, strength, speech and gait are normal.  Skin: residual varicose vein changes, fine, no inflammation, BLE. Right great toenail with thickening consistent with fungal changes. Remaining skin is warm and dry. No rash noted. No erythema.  Psychiatric: She has a normal mood and affect. Her behavior is normal. Judgment and thought content normal.    Lab Results  Component Value Date   WBC 5.5 10/31/2011   HGB 12.1 10/31/2011   HCT 36.5 10/31/2011   PLT 284.0 10/31/2011   GLUCOSE 106* 10/31/2011   CHOL 182 10/31/2011   TRIG 62.0 10/31/2011   HDL 88.00 10/31/2011  LDLDIRECT 104.9 04/23/2007   LDLCALC 82 10/31/2011   ALT 23 10/31/2011   AST 16 10/31/2011   NA 142 10/31/2011   K 4.1 10/31/2011   CL 107 10/31/2011   CREATININE 0.6 10/31/2011   BUN 15 10/31/2011   CO2 30 10/31/2011   TSH 0.77 09/26/2012    Dg Knee Complete 4 Views Left  12/28/2013   CLINICAL DATA:  New twisted left knee injury.  Knee pain.  EXAM: LEFT KNEE - COMPLETE 4+ VIEW  COMPARISON:  None.  FINDINGS: Minimal spurring of the lateral tibial plateau in the tibial spine. Subtly reduced articular space in the medial compartment.   Otherwise negative exam.  IMPRESSION: 1. Minimal degenerative findings. Otherwise negative exam. If pain persists despite conservative therapy, MRI may be warranted for further characterization.   Electronically Signed   By: Herbie Baltimore M.D.   On: 12/28/2013 20:03       Assessment & Plan:   CPX/v70.0 - Patient has been counseled on age-appropriate routine health concerns for screening and prevention. These are reviewed and up-to-date. Immunizations are up-to-date or declined. Labs ordered and reviewed.  Will refer for cologaurd as pt declines risk of colonoscopy for screening  Great toenail with fungal changes. Refer to podiatry. Recommend tea tree oil rather than prescription medications due to systemic potential risk related to systemic antifungal for cosmetic problem  Problem List Items Addressed This Visit   Other postablative hypothyroidism      Check TSH and adjust dose as needed Follows with endo prn Lab Results  Component Value Date   TSH 0.77 09/26/2012       Relevant Medications      levothyroxine (SYNTHROID, LEVOTHROID) tablet   Other Relevant Orders      TSH    Other Visit Diagnoses   Routine general medical examination at a health care facility    -  Primary    Relevant Orders       Basic metabolic panel       CBC with Differential       Hepatic function panel       Lipid panel       TSH       Urinalysis, Routine w reflex microscopic    Fungal toenail infection        Relevant Orders       Ambulatory referral to Podiatry    Special screening for malignant neoplasms, colon

## 2014-02-17 ENCOUNTER — Ambulatory Visit (INDEPENDENT_AMBULATORY_CARE_PROVIDER_SITE_OTHER): Payer: BC Managed Care – PPO | Admitting: Podiatry

## 2014-02-17 ENCOUNTER — Encounter: Payer: Self-pay | Admitting: Podiatry

## 2014-02-17 VITALS — BP 147/82 | HR 64 | Resp 13 | Ht 66.0 in | Wt 171.0 lb

## 2014-02-17 DIAGNOSIS — B351 Tinea unguium: Secondary | ICD-10-CM

## 2014-02-17 NOTE — Patient Instructions (Signed)
Onychomycosis/Fungal Toenails  WHAT IS IT? An infection that lies within the keratin of your nail plate that is caused by a fungus.  WHY ME? Fungal infections affect all ages, sexes, races, and creeds.  There may be many factors that predispose you to a fungal infection such as age, coexisting medical conditions such as diabetes, or an autoimmune disease; stress, medications, fatigue, genetics, etc.  Bottom line: fungus thrives in a warm, moist environment and your shoes offer such a location.  IS IT CONTAGIOUS? Theoretically, yes.  You do not want to share shoes, nail clippers or files with someone who has fungal toenails.  Walking around barefoot in the same room or sleeping in the same bed is unlikely to transfer the organism.  It is important to realize, however, that fungus can spread easily from one nail to the next on the same foot.  HOW DO WE TREAT THIS?  There are several ways to treat this condition.  Treatment may depend on many factors such as age, medications, pregnancy, liver and kidney conditions, etc.  It is best to ask your doctor which options are available to you.  1. No treatment.   Unlike many other medical concerns, you can live with this condition.  However for many people this can be a painful condition and may lead to ingrown toenails or a bacterial infection.  It is recommended that you keep the nails cut short to help reduce the amount of fungal nail. 2. Topical treatment.  These range from herbal remedies to prescription strength nail lacquers.  About 40-50% effective, topicals require twice daily application for approximately 9 to 12 months or until an entirely new nail has grown out.  The most effective topicals are medical grade medications available through physicians offices. 3. Oral antifungal medications.  With an 80-90% cure rate, the most common oral medication requires 3 to 4 months of therapy and stays in your system for a year as the new nail grows out.  Oral  antifungal medications do require blood work to make sure it is a safe drug for you.  A liver function panel will be performed prior to starting the medication and after the first month of treatment.  It is important to have the blood work performed to avoid any harmful side effects.  In general, this medication safe but blood work is required. 4. Laser Therapy.  This treatment is performed by applying a specialized laser to the affected nail plate.  This therapy is noninvasive, fast, and non-painful.  It is not covered by insurance and is therefore, out of pocket.  The results have been very good with a 80-95% cure rate.  [facility] is the only practice in the area to offer this therapy. 5. Permanent Nail Avulsion.  Removing the entire nail so that a new nail will not grow back.   Recommendations: *Over the Counter -tea tree oil -fungi-nail -formula-3  *Prescription topical -Minda DittoKerydin -Jublia

## 2014-02-17 NOTE — Progress Notes (Signed)
   Subjective:    Patient ID: Jaime Rose, female    DOB: 11-09-1950, 63 y.o.   MRN: [id number]  HPI Comments: Jaime Rose, 63 year old female, presents the office today with a thickened elongated nail on the right big toe. She states of the nail has been like this for approximate one year and she's had no prior treatment. She denies any redness or drainage from the nail. She states of the nail is painful particularly shoe gear due to the length and pressing into the shoe. No other complaints at this time.      Review of Systems  Musculoskeletal: Positive for arthralgias.       12/25/2013 left knee injury  All other systems reviewed and are negative.      Objective:   Physical Exam AAO x3, NAD DP/PT pulses palpable bilaterally, CRT less than 3 seconds Protective sensation intact with Simms Weinstein monofilament, vibratory sensation intact, Achilles tendon reflex intact Right hallux nail hypertrophic, dystrophic, elongated, brittle, yellow brown discoloration. There is no ascending erythema or drainage. Tenderness directly overlying the elongated nail. Lesser digits have some areas of splitting within the nail without any surrounding erythema, drainage, ingrowing.  MMT 5/5, ROM WNL No calf pain with compression, swelling, warmth, erythema. No open lesions.     Assessment & Plan:  63 year old female with right hallux onychomycosis. -Treatment options discussed including alternatives, risks, complications. Also discussed etiology. -Nail sharply debrided without complications to patient comfort. -Discussed various treatments and various medications including both over-the-counter versus prescription therapy. At this time she wishes to proceed with topical treatment. Discussed with her both over-the-counter therapies including fungi-nail and other medications as well as prescription topicals including Jublia and Kerydin. Also discussed with her other treatments including tee tree  oil. She'll consider her options. -Follow-up as needed. In the meantime call the office in the questions, concerns, change in symptoms.

## 2014-05-07 ENCOUNTER — Ambulatory Visit: Payer: BC Managed Care – PPO

## 2014-05-07 ENCOUNTER — Ambulatory Visit (INDEPENDENT_AMBULATORY_CARE_PROVIDER_SITE_OTHER): Payer: BC Managed Care – PPO | Admitting: *Deleted

## 2014-05-07 DIAGNOSIS — Z23 Encounter for immunization: Secondary | ICD-10-CM

## 2014-10-22 ENCOUNTER — Other Ambulatory Visit: Payer: Self-pay | Admitting: Internal Medicine

## 2015-02-01 ENCOUNTER — Other Ambulatory Visit: Payer: Self-pay | Admitting: Internal Medicine

## 2015-02-02 NOTE — Telephone Encounter (Signed)
lmovm to call back to schedule next physical.

## 2015-02-02 NOTE — Telephone Encounter (Signed)
90d refill done Please schedule pt for CPE if not already done thanks

## 2015-04-30 ENCOUNTER — Telehealth: Payer: Self-pay | Admitting: Internal Medicine

## 2015-05-03 NOTE — Telephone Encounter (Signed)
Pt called back regarding her levothyroxine. [facility]

## 2015-05-04 MED ORDER — LEVOTHYROXINE SODIUM 125 MCG PO TABS: 125.0000 ug | ORAL_TABLET | Freq: Every day | ORAL | Status: DC

## 2015-05-04 NOTE — Addendum Note (Signed)
Addended by: Verlan FriendsAIRRIKIER DAVIDSON, Elan Brainerd M on: 05/04/2015 08:23 AM   Modules accepted: Orders

## 2015-05-04 NOTE — Telephone Encounter (Signed)
erx Resent to pharmacy.

## 2015-05-06 ENCOUNTER — Telehealth: Payer: Self-pay

## 2015-05-06 MED ORDER — LEVOTHYROXINE SODIUM 125 MCG PO TABS: 125.0000 ug | ORAL_TABLET | Freq: Every day | ORAL | Status: DC

## 2015-05-06 NOTE — Telephone Encounter (Signed)
erx sent. Upcoming appt with new pcp on 06/07/2015. Sent 90 day and message to keep up coming appt.

## 2015-05-11 ENCOUNTER — Encounter: Payer: Self-pay | Admitting: Internal Medicine

## 2015-05-24 ENCOUNTER — Other Ambulatory Visit: Payer: Self-pay

## 2015-05-24 DIAGNOSIS — Z1231 Encounter for screening mammogram for malignant neoplasm of breast: Secondary | ICD-10-CM

## 2015-06-07 ENCOUNTER — Encounter: Payer: Self-pay | Admitting: Internal Medicine

## 2015-06-07 ENCOUNTER — Other Ambulatory Visit (INDEPENDENT_AMBULATORY_CARE_PROVIDER_SITE_OTHER): Payer: BC Managed Care – PPO

## 2015-06-07 ENCOUNTER — Ambulatory Visit (INDEPENDENT_AMBULATORY_CARE_PROVIDER_SITE_OTHER): Payer: BC Managed Care – PPO | Admitting: Internal Medicine

## 2015-06-07 VITALS — BP 130/76 | HR 67 | Temp 98.2°F | Resp 16 | Wt 175.0 lb

## 2015-06-07 DIAGNOSIS — Z23 Encounter for immunization: Secondary | ICD-10-CM | POA: Diagnosis not present

## 2015-06-07 DIAGNOSIS — Z0000 Encounter for general adult medical examination without abnormal findings: Secondary | ICD-10-CM

## 2015-06-07 DIAGNOSIS — E038 Other specified hypothyroidism: Secondary | ICD-10-CM | POA: Diagnosis not present

## 2015-06-07 LAB — CBC WITH DIFFERENTIAL/PLATELET
Basophils Absolute: 0 10*3/uL (ref 0.0–0.1)
Basophils Relative: 0.7 % (ref 0.0–3.0)
EOS PCT: 3.8 % (ref 0.0–5.0)
Eosinophils Absolute: 0.2 10*3/uL (ref 0.0–0.7)
HCT: 39.4 % (ref 36.0–46.0)
HEMOGLOBIN: 13 g/dL (ref 12.0–15.0)
LYMPHS ABS: 1.4 10*3/uL (ref 0.7–4.0)
Lymphocytes Relative: 23.7 % (ref 12.0–46.0)
MCHC: 33 g/dL (ref 30.0–36.0)
MCV: 86.6 fl (ref 78.0–100.0)
MONOS PCT: 9.2 % (ref 3.0–12.0)
Monocytes Absolute: 0.5 10*3/uL (ref 0.1–1.0)
NEUTROS PCT: 62.6 % (ref 43.0–77.0)
Neutro Abs: 3.6 10*3/uL (ref 1.4–7.7)
Platelets: 364 10*3/uL (ref 150.0–400.0)
RBC: 4.56 Mil/uL (ref 3.87–5.11)
RDW: 14 % (ref 11.5–15.5)
WBC: 5.7 10*3/uL (ref 4.0–10.5)

## 2015-06-07 LAB — LIPID PANEL
CHOLESTEROL: 219 mg/dL — AB (ref 0–200)
HDL: 93.3 mg/dL (ref >39.00)
LDL Cholesterol: 116 mg/dL — ABNORMAL HIGH (ref 0–99)
NonHDL: 125.67
TRIGLYCERIDES: 50 mg/dL (ref 0.0–149.0)
Total CHOL/HDL Ratio: 2
VLDL: 10 mg/dL (ref 0.0–40.0)

## 2015-06-07 LAB — COMPREHENSIVE METABOLIC PANEL
ALBUMIN: 4 g/dL (ref 3.5–5.2)
ALK PHOS: 85 U/L (ref 39–117)
ALT: 15 U/L (ref 0–35)
AST: 13 U/L (ref 0–37)
BUN: 20 mg/dL (ref 6–23)
CO2: 27 mEq/L (ref 19–32)
Calcium: 9.7 mg/dL (ref 8.4–10.5)
Chloride: 105 mEq/L (ref 96–112)
Creatinine, Ser: 0.8 mg/dL (ref 0.40–1.20)
GFR: 77.13 mL/min (ref >60.00)
Glucose, Bld: 103 mg/dL — ABNORMAL HIGH (ref 70–99)
POTASSIUM: 4 meq/L (ref 3.5–5.1)
Sodium: 140 mEq/L (ref 135–145)
TOTAL PROTEIN: 7.6 g/dL (ref 6.0–8.3)
Total Bilirubin: 0.7 mg/dL (ref 0.2–1.2)

## 2015-06-07 LAB — HEMOGLOBIN A1C: Hgb A1c MFr Bld: 5.7 % (ref 4.6–6.5)

## 2015-06-07 LAB — TSH: TSH: 0.51 u[IU]/mL (ref 0.35–4.50)

## 2015-06-07 MED ORDER — LEVOTHYROXINE SODIUM 125 MCG PO TABS: 125.0000 ug | ORAL_TABLET | Freq: Every day | ORAL | Status: DC

## 2015-06-07 MED ORDER — CLOBETASOL PROPIONATE 0.05 % EX OINT: TOPICAL_OINTMENT | Freq: Two times a day (BID) | CUTANEOUS | Status: DC

## 2015-06-07 NOTE — Patient Instructions (Addendum)
We have reviewed your prior records including labs and tests today.  Test(s) ordered today. Your results will be released to Jaime Rose (or called to you) after review, usually within 72hours after test completion. If any changes need to be made, you will be notified at that same time.  All other Health Maintenance issues reviewed.   All recommended immunizations and age-appropriate screenings are up-to-date/discussed.  Tetanus (tdap) vaccine administered today.   Medications reviewed and updated.  No changes recommended at this time.  Your prescription(s) have been submitted to your pharmacy. Please take as directed and contact our office if you believe you are having problem(s) with the medication(s).   Please followup annually  Health Maintenance, Female Adopting a healthy lifestyle and getting preventive care can go a long way to promote health and wellness. Talk with your health care provider about what schedule of regular examinations is right for you. This is a good chance for you to check in with your provider about disease prevention and staying healthy. In between checkups, there are plenty of things you can do on your own. Experts have done a lot of research about which lifestyle changes and preventive measures are most likely to keep you healthy. Ask your health care provider for more information. WEIGHT AND DIET  Eat a healthy diet  Be sure to include plenty of vegetables, fruits, low-fat dairy products, and lean protein.  Do not eat a lot of foods high in solid fats, added sugars, or salt.  Get regular exercise. This is one of the most important things you can do for your health.  Most adults should exercise for at least 150 minutes each week. The exercise should increase your heart rate and make you sweat (moderate-intensity exercise).  Most adults should also do strengthening exercises at least twice a week. This is in addition to the moderate-intensity exercise.   Maintain a healthy weight  Body mass index (BMI) is a measurement that can be used to identify possible weight problems. It estimates body fat based on height and weight. Your health care provider can help determine your BMI and help you achieve or maintain a healthy weight.  For females 63 years of age and older:   A BMI below 18.5 is considered underweight.  A BMI of 18.5 to 24.9 is normal.  A BMI of 25 to 29.9 is considered overweight.  A BMI of 30 and above is considered obese.  Watch levels of cholesterol and blood lipids  You should start having your blood tested for lipids and cholesterol at 63 years of age, then have this test every 5 years.  You may need to have your cholesterol levels checked more often if:  Your lipid or cholesterol levels are high.  You are older than 63 years of age.  You are at high risk for heart disease.  CANCER SCREENING   Lung Cancer  Lung cancer screening is recommended for adults 33-52 years old who are at high risk for lung cancer because of a history of smoking.  A yearly low-dose CT scan of the lungs is recommended for people who:  Currently smoke.  Have quit within the past 15 years.  Have at least a 30-pack-year history of smoking. A pack year is smoking an average of one pack of cigarettes a day for 1 year.  Yearly screening should continue until it has been 15 years since you quit.  Yearly screening should stop if you develop a health problem that would prevent you  from having lung cancer treatment.  Breast Cancer  Practice breast self-awareness. This means understanding how your breasts normally appear and feel.  It also means doing regular breast self-exams. Let your health care provider know about any changes, no matter how small.  If you are in your 20s or 30s, you should have a clinical breast exam (CBE) by a health care provider every 1-3 years as part of a regular health exam.  If you are 44 or older, have a  CBE every year. Also consider having a breast X-ray (mammogram) every year.  If you have a family history of breast cancer, talk to your health care provider about genetic screening.  If you are at high risk for breast cancer, talk to your health care provider about having an MRI and a mammogram every year.  Breast cancer gene (BRCA) assessment is recommended for women who have family members with BRCA-related cancers. BRCA-related cancers include:  Breast.  Ovarian.  Tubal.  Peritoneal cancers.  Results of the assessment will determine the need for genetic counseling and BRCA1 and BRCA2 testing. Cervical Cancer Your health care provider may recommend that you be screened regularly for cancer of the pelvic organs (ovaries, uterus, and vagina). This screening involves a pelvic examination, including checking for microscopic changes to the surface of your cervix (Pap test). You may be encouraged to have this screening done every 3 years, beginning at age 6.  For women ages 79-65, health care providers may recommend pelvic exams and Pap testing every 3 years, or they may recommend the Pap and pelvic exam, combined with testing for human papilloma virus (HPV), every 5 years. Some types of HPV increase your risk of cervical cancer. Testing for HPV may also be done on women of any age with unclear Pap test results.  Other health care providers may not recommend any screening for nonpregnant women who are considered low risk for pelvic cancer and who do not have symptoms. Ask your health care provider if a screening pelvic exam is right for you.  If you have had past treatment for cervical cancer or a condition that could lead to cancer, you need Pap tests and screening for cancer for at least 20 years after your treatment. If Pap tests have been discontinued, your risk factors (such as having a new sexual partner) need to be reassessed to determine if screening should resume. Some women have  medical problems that increase the chance of getting cervical cancer. In these cases, your health care provider may recommend more frequent screening and Pap tests. Colorectal Cancer  This type of cancer can be detected and often prevented.  Routine colorectal cancer screening usually begins at 63 years of age and continues through 63 years of age.  Your health care provider may recommend screening at an earlier age if you have risk factors for colon cancer.  Your health care provider may also recommend using home test kits to check for hidden blood in the stool.  A small camera at the end of a tube can be used to examine your colon directly (sigmoidoscopy or colonoscopy). This is done to check for the earliest forms of colorectal cancer.  Routine screening usually begins at age 24.  Direct examination of the colon should be repeated every 5-10 years through 62 years of age. However, you may need to be screened more often if early forms of precancerous polyps or small growths are found. Skin Cancer  Check your skin from head to toe  regularly.  Tell your health care provider about any new moles or changes in moles, especially if there is a change in a mole's shape or color.  Also tell your health care provider if you have a mole that is larger than the size of a pencil eraser.  Always use sunscreen. Apply sunscreen liberally and repeatedly throughout the day.  Protect yourself by wearing long sleeves, pants, a wide-brimmed hat, and sunglasses whenever you are outside. HEART DISEASE, DIABETES, AND HIGH BLOOD PRESSURE   High blood pressure causes heart disease and increases the risk of stroke. High blood pressure is more likely to develop in:  People who have blood pressure in the high end of the normal range (130-139/85-89 mm Hg).  People who are overweight or obese.  People who are African American.  If you are 70-75 years of age, have your blood pressure checked every 3-5 years.  If you are 26 years of age or older, have your blood pressure checked every year. You should have your blood pressure measured twice--once when you are at a hospital or clinic, and once when you are not at a hospital or clinic. Record the average of the two measurements. To check your blood pressure when you are not at a hospital or clinic, you can use:  An automated blood pressure machine at a pharmacy.  A home blood pressure monitor.  If you are between 65 years and 46 years old, ask your health care provider if you should take aspirin to prevent strokes.  Have regular diabetes screenings. This involves taking a blood sample to check your fasting blood sugar level.  If you are at a normal weight and have a low risk for diabetes, have this test once every three years after 63 years of age.  If you are overweight and have a high risk for diabetes, consider being tested at a younger age or more often. PREVENTING INFECTION  Hepatitis B  If you have a higher risk for hepatitis B, you should be screened for this virus. You are considered at high risk for hepatitis B if:  You were born in a country where hepatitis B is common. Ask your health care provider which countries are considered high risk.  Your parents were born in a high-risk country, and you have not been immunized against hepatitis B (hepatitis B vaccine).  You have HIV or AIDS.  You use needles to inject street drugs.  You live with someone who has hepatitis B.  You have had sex with someone who has hepatitis B.  You get hemodialysis treatment.  You take certain medicines for conditions, including cancer, organ transplantation, and autoimmune conditions. Hepatitis C  Blood testing is recommended for:  Everyone born from [birth year] through 1963.  Anyone with known risk factors for hepatitis C. Sexually transmitted infections (STIs)  You should be screened for sexually transmitted infections (STIs) including gonorrhea and  chlamydia if:  You are sexually active and are younger than 63 years of age.  You are older than 63 years of age and your health care provider tells you that you are at risk for this type of infection.  Your sexual activity has changed since you were last screened and you are at an increased risk for chlamydia or gonorrhea. Ask your health care provider if you are at risk.  If you do not have HIV, but are at risk, it may be recommended that you take a prescription medicine daily to prevent HIV infection. This is  called pre-exposure prophylaxis (PrEP). You are considered at risk if:  You are sexually active and do not regularly use condoms or know the HIV status of your partner(s).  You take drugs by injection.  You are sexually active with a partner who has HIV. Talk with your health care provider about whether you are at high risk of being infected with HIV. If you choose to begin PrEP, you should first be tested for HIV. You should then be tested every 3 months for as long as you are taking PrEP.  PREGNANCY   If you are premenopausal and you may become pregnant, ask your health care provider about preconception counseling.  If you may become pregnant, take 400 to 800 micrograms (mcg) of folic acid every day.  If you want to prevent pregnancy, talk to your health care provider about birth control (contraception). OSTEOPOROSIS AND MENOPAUSE   Osteoporosis is a disease in which the bones lose minerals and strength with aging. This can result in serious bone fractures. Your risk for osteoporosis can be identified using a bone density scan.  If you are 75 years of age or older, or if you are at risk for osteoporosis and fractures, ask your health care provider if you should be screened.  Ask your health care provider whether you should take a calcium or vitamin D supplement to lower your risk for osteoporosis.  Menopause may have certain physical symptoms and risks.  Hormone replacement  therapy may reduce some of these symptoms and risks. Talk to your health care provider about whether hormone replacement therapy is right for you.  HOME CARE INSTRUCTIONS   Schedule regular health, dental, and eye exams.  Stay current with your immunizations.   Do not use any tobacco products including cigarettes, chewing tobacco, or electronic cigarettes.  If you are pregnant, do not drink alcohol.  If you are breastfeeding, limit how much and how often you drink alcohol.  Limit alcohol intake to no more than 1 drink per day for nonpregnant women. One drink equals 12 ounces of beer, 5 ounces of wine, or 1 ounces of hard liquor.  Do not use street drugs.  Do not share needles.  Ask your health care provider for help if you need support or information about quitting drugs.  Tell your health care provider if you often feel depressed.  Tell your health care provider if you have ever been abused or do not feel safe at home.   This information is not intended to replace advice given to you by your health care provider. Make sure you discuss any questions you have with your health care provider.   Document Released: 10/30/2010 Document Revised: 05/07/2014 Document Reviewed: 03/18/2013

## 2015-06-07 NOTE — Progress Notes (Signed)
Subjective:    Patient ID: Jaime Rose, female    DOB: [birth year], 65 y.o.   MRN: [id number]  HPI She is here to establish with a new pcp.  She is here for a physical exam.   Hypothyroidism:  She is taking her medication daily.  She denies any recent changes in energy or weight that are unexplained.   Her only concern is her family history and avoiding heart disease and diabetes.   There have been no changes in her health since she was here last.    Medications and allergies reviewed with patient and updated if appropriate.  Patient Active Problem List   Diagnosis Date Noted  . Hypothyroidism 03/20/2012  . 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 ALLERGIC RHINITIS 12/21/2009  . CARDIAC MURMUR, HX OF 12/21/2009  . TOBACCO USE, QUIT 12/21/2009  . CHICKENPOX, HX OF 12/21/2009    Current Outpatient Prescriptions on File Prior to Visit  Medication Sig Dispense Refill  . cycloSPORINE (RESTASIS) 0.05 % ophthalmic emulsion 2 drops every morning.    Marland Kitchen ibuprofen (ADVIL,MOTRIN) 800 MG tablet Take 1 tablet (800 mg total) by mouth 3 (three) times daily. 21 tablet 0  . loratadine (CLARITIN) 10 MG tablet Take 10 mg by mouth as needed.      No current facility-administered medications on file prior to visit.    Past Medical History  Diagnosis Date  . ALLERGIC RHINITIS   . Hyperthyroidism     normal TFTs 11/2009 , dx 10/2011  . Varicose vein     s/p sclerosis tx summer 2013    Past Surgical History  Procedure Laterality Date  . Endovenous ablation saphenous vein w/ laser  04-11-2012    endovenous laser ablation right greater saphenous vein and stab phlebectomy  right leg  by Gretta Began MD    Social History   Social History  . Marital Status: Single    Spouse Name: N/A  . Number of Children: N/A  . Years of Education: N/A   Social History Main Topics  . Smoking status: Former Smoker    Types: Cigarettes    Quit date: 04/30/1988  .  Smokeless tobacco: Never Used  . Alcohol Use: 0.0 - 0.5 oz/week    0-1 Standard drinks or equivalent per week  . Drug Use: No  . Sexual Activity: Not Asked   Other Topics Concern  . None   Social History Narrative   Single, lives with 2 dtrs- [employer]      Exercising regularly    Family History  Problem Relation Age of Onset  . Heart disease Father   . Autoimmune disease Father   . Hypertension Father   . Other Father     varicose veins, AAA  . Hyperlipidemia Mother   . Other Mother     varicose veins, AAA  . Heart attack Mother   . Hyperlipidemia Sister   . Diabetes Maternal Grandfather   . Diabetes Paternal Grandfather     Review of Systems  Constitutional: Negative for fever, chills, appetite change and fatigue.  HENT: Negative for hearing loss.   Eyes: Negative for visual disturbance.  Respiratory: Negative for cough, shortness of breath and wheezing.   Cardiovascular: Negative for chest pain, palpitations and leg swelling.  Gastrointestinal: Negative for nausea, abdominal pain, diarrhea, constipation and blood in stool.       No GERD  Genitourinary: Negative for dysuria and hematuria.  Musculoskeletal: Negative for myalgias,  back pain and arthralgias.  Skin: Negative for color change and rash.  Neurological: Negative for dizziness, light-headedness and headaches.  Psychiatric/Behavioral: Negative for dysphoric mood. The patient is not nervous/anxious.        Objective:   Filed Vitals:   06/07/15 0808  BP: 130/76  Pulse: 67  Temp: 98.2 F (36.8 C)  Resp: 16   Filed Weights   06/07/15 0808  Weight: 175 lb (79.379 kg)   Body mass index is 28.26 kg/(m^2).   Physical Exam Constitutional: She appears well-developed and well-nourished. No distress.  HENT:  Head: Normocephalic and atraumatic.  Right Ear: External ear normal. Normal ear canal and TM Left Ear: External ear normal.  Normal ear canal and TM Mouth/Throat: Oropharynx is clear and  moist.  Normal bilateral ear canals and tympanic membranes  Eyes: Conjunctivae and EOM are normal.  Neck: Neck supple. No tracheal deviation present. No thyromegaly present.  No carotid bruit  Cardiovascular: Normal rate, regular rhythm and normal heart sounds.   No murmur heard.  No edema. Pulmonary/Chest: Effort normal and breath sounds normal. No respiratory distress. She has no wheezes. She has no rales.  Breast: deferred to Gyn Abdominal: Soft. She exhibits no distension. There is no tenderness.  Lymphadenopathy: She has no cervical adenopathy.  Skin: Skin is warm and dry. She is not diaphoretic.  Psychiatric: She has a normal mood and affect. Her behavior is normal.       Assessment & Plan:   Physical exam: Screening blood work  ordered Immunizations - tdap today, she will check on shingles vaccines Colonoscopy - declined - will do cologuard Mammogram  Up to date Gyn   -  Will schedule Dexa - ordered Eye exams - up to date EKG - last EKG reviewed - no EKG today Exercise -- exercises  Weight - just above normal - she is working on weight loss Skin - no changes in moles, no concerns Substance abuse  - no concerns   See Problem List for Assessment and Plan of chronic medical problems.   Follow up annually

## 2015-06-07 NOTE — Progress Notes (Signed)
Pre visit review using our clinic review tool, if applicable. No additional management support is needed unless otherwise documented below in the visit note. 

## 2015-06-07 NOTE — Assessment & Plan Note (Signed)
Check tsh  Titrate med dose if needed  

## 2015-06-15 ENCOUNTER — Ambulatory Visit: Payer: Self-pay

## 2015-07-26 ENCOUNTER — Other Ambulatory Visit: Payer: Self-pay | Admitting: Internal Medicine

## 2015-07-26 DIAGNOSIS — Z780 Asymptomatic menopausal state: Secondary | ICD-10-CM | POA: Insufficient documentation

## 2015-08-10 ENCOUNTER — Ambulatory Visit
Admission: RE | Admit: 2015-08-10 | Discharge: 2015-08-10 | Disposition: A | Discharge status: Home / Self Care | Payer: BC Managed Care – PPO | Source: Ambulatory Visit

## 2015-08-10 DIAGNOSIS — Z1231 Encounter for screening mammogram for malignant neoplasm of breast: Secondary | ICD-10-CM

## 2015-09-30 ENCOUNTER — Other Ambulatory Visit: Payer: Self-pay | Admitting: Internal Medicine

## 2015-09-30 DIAGNOSIS — Z780 Asymptomatic menopausal state: Secondary | ICD-10-CM

## 2016-08-29 ENCOUNTER — Other Ambulatory Visit: Payer: Self-pay | Admitting: Internal Medicine

## 2016-12-10 ENCOUNTER — Telehealth: Payer: Self-pay | Admitting: Internal Medicine

## 2016-12-10 MED ORDER — LEVOTHYROXINE SODIUM 125 MCG PO TABS: 125.0000 ug | ORAL_TABLET | Freq: Every day | ORAL | 0 refills | Status: DC

## 2016-12-10 NOTE — Telephone Encounter (Signed)
Per office policy sent 30 day script to local pharmacy until appt...Raechel Chute/lmb

## 2016-12-10 NOTE — Telephone Encounter (Signed)
Pt called requesting a refill on levothyroxine (SYNTHROID, LEVOTHROID) 125 MCG tablet to be sent to [facility], [place], [place] [id number] (this is a new pharmacy). She is scheduled to see Dr Dorette GrateBurn on Friday, 12/21/16 but she is out of this medication.

## 2016-12-21 ENCOUNTER — Other Ambulatory Visit (INDEPENDENT_AMBULATORY_CARE_PROVIDER_SITE_OTHER): Payer: BC Managed Care – PPO

## 2016-12-21 ENCOUNTER — Ambulatory Visit (INDEPENDENT_AMBULATORY_CARE_PROVIDER_SITE_OTHER): Payer: BC Managed Care – PPO | Admitting: Internal Medicine

## 2016-12-21 ENCOUNTER — Encounter: Payer: Self-pay | Admitting: Internal Medicine

## 2016-12-21 VITALS — BP 130/82 | HR 61 | Temp 97.8°F | Resp 16 | Ht 66.0 in | Wt 179.0 lb

## 2016-12-21 DIAGNOSIS — R7303 Prediabetes: Secondary | ICD-10-CM | POA: Insufficient documentation

## 2016-12-21 DIAGNOSIS — Z13820 Encounter for screening for osteoporosis: Secondary | ICD-10-CM | POA: Diagnosis not present

## 2016-12-21 DIAGNOSIS — I6523 Occlusion and stenosis of bilateral carotid arteries: Secondary | ICD-10-CM | POA: Insufficient documentation

## 2016-12-21 DIAGNOSIS — R739 Hyperglycemia, unspecified: Secondary | ICD-10-CM

## 2016-12-21 DIAGNOSIS — R0989 Other specified symptoms and signs involving the circulatory and respiratory systems: Secondary | ICD-10-CM

## 2016-12-21 DIAGNOSIS — E2839 Other primary ovarian failure: Secondary | ICD-10-CM | POA: Diagnosis not present

## 2016-12-21 DIAGNOSIS — Z0000 Encounter for general adult medical examination without abnormal findings: Secondary | ICD-10-CM

## 2016-12-21 DIAGNOSIS — E039 Hypothyroidism, unspecified: Secondary | ICD-10-CM

## 2016-12-21 LAB — LIPID PANEL
Cholesterol: 214 mg/dL — ABNORMAL HIGH (ref 0–200)
HDL: 81.7 mg/dL
LDL Cholesterol: 123 mg/dL — ABNORMAL HIGH (ref 0–99)
NonHDL: 132.7
Total CHOL/HDL Ratio: 3
Triglycerides: 49 mg/dL (ref 0.0–149.0)
VLDL: 9.8 mg/dL (ref 0.0–40.0)

## 2016-12-21 LAB — COMPREHENSIVE METABOLIC PANEL
ALK PHOS: 75 U/L (ref 39–117)
ALT: 13 U/L (ref 0–35)
AST: 12 U/L (ref 0–37)
Albumin: 4 g/dL (ref 3.5–5.2)
BUN: 19 mg/dL (ref 6–23)
CHLORIDE: 104 meq/L (ref 96–112)
CO2: 31 mEq/L (ref 19–32)
Calcium: 9.8 mg/dL (ref 8.4–10.5)
Creatinine, Ser: 0.78 mg/dL (ref 0.40–1.20)
GFR: 79.02 mL/min (ref >60.00)
GLUCOSE: 101 mg/dL — AB (ref 70–99)
POTASSIUM: 4.2 meq/L (ref 3.5–5.1)
SODIUM: 140 meq/L (ref 135–145)
TOTAL PROTEIN: 7.4 g/dL (ref 6.0–8.3)
Total Bilirubin: 0.7 mg/dL (ref 0.2–1.2)

## 2016-12-21 LAB — CBC WITH DIFFERENTIAL/PLATELET
BASOS PCT: 0.9 % (ref 0.0–3.0)
Basophils Absolute: 0.1 10*3/uL (ref 0.0–0.1)
EOS PCT: 2.5 % (ref 0.0–5.0)
Eosinophils Absolute: 0.2 10*3/uL (ref 0.0–0.7)
HCT: 39 % (ref 36.0–46.0)
HEMOGLOBIN: 12.8 g/dL (ref 12.0–15.0)
LYMPHS ABS: 1.7 10*3/uL (ref 0.7–4.0)
Lymphocytes Relative: 27.3 % (ref 12.0–46.0)
MCHC: 32.9 g/dL (ref 30.0–36.0)
MCV: 89.5 fl (ref 78.0–100.0)
MONOS PCT: 8.8 % (ref 3.0–12.0)
Monocytes Absolute: 0.5 10*3/uL (ref 0.1–1.0)
Neutro Abs: 3.7 10*3/uL (ref 1.4–7.7)
Neutrophils Relative %: 60.5 % (ref 43.0–77.0)
Platelets: 342 10*3/uL (ref 150.0–400.0)
RBC: 4.35 Mil/uL (ref 3.87–5.11)
RDW: 14.1 % (ref 11.5–15.5)
WBC: 6.1 10*3/uL (ref 4.0–10.5)

## 2016-12-21 LAB — HEMOGLOBIN A1C: Hgb A1c MFr Bld: 5.8 % (ref 4.6–6.5)

## 2016-12-21 LAB — TSH: TSH: 2.45 u[IU]/mL (ref 0.35–4.50)

## 2016-12-21 NOTE — Assessment & Plan Note (Signed)
Check tsh  Titrate med dose if needed  

## 2016-12-21 NOTE — Assessment & Plan Note (Signed)
fam hx of stroke Right carotid bruit - will check [place]

## 2016-12-21 NOTE — Patient Instructions (Addendum)
Test(s) ordered today. Your results will be released to [place] (or called to you) after review, usually within 72hours after test completion. If any changes need to be made, you will be notified at that same time.  All other Health Maintenance issues reviewed.   All recommended immunizations and age-appropriate screenings are up-to-date or discussed.   Medications reviewed and updated.  No changes recommended at this time.  A carotid ultrasound was ordered.  A bone density test was ordered.  Please followup in one year   Health Maintenance, Female Adopting a healthy lifestyle and getting preventive care can go a long way to promote health and wellness. Talk with your health care provider about what schedule of regular examinations is right for you. This is a good chance for you to check in with your provider about disease prevention and staying healthy. In between checkups, there are plenty of things you can do on your own. Experts have done a lot of research about which lifestyle changes and preventive measures are most likely to keep you healthy. Ask your health care provider for more information. Weight and diet Eat a healthy diet  Be sure to include plenty of vegetables, fruits, low-fat dairy products, and lean protein.  Do not eat a lot of foods high in solid fats, added sugars, or salt.  Get regular exercise. This is one of the most important things you can do for your health. ? Most adults should exercise for at least 150 minutes each week. The exercise should increase your heart rate and make you sweat (moderate-intensity exercise). ? Most adults should also do strengthening exercises at least twice a week. This is in addition to the moderate-intensity exercise.  Maintain a healthy weight  Body mass index (BMI) is a measurement that can be used to identify possible weight problems. It estimates body fat based on height and weight. Your health care provider can help determine  your BMI and help you achieve or maintain a healthy weight.  For females 102 years of age and older: ? A BMI below 18.5 is considered underweight. ? A BMI of 18.5 to 24.9 is normal. ? A BMI of 25 to 29.9 is considered overweight. ? A BMI of 30 and above is considered obese.  Watch levels of cholesterol and blood lipids  You should start having your blood tested for lipids and cholesterol at 64 years of age, then have this test every 5 years.  You may need to have your cholesterol levels checked more often if: ? Your lipid or cholesterol levels are high. ? You are older than 64 years of age. ? You are at high risk for heart disease.  Cancer screening Lung Cancer  Lung cancer screening is recommended for adults 17-69 years old who are at high risk for lung cancer because of a history of smoking.  A yearly low-dose CT scan of the lungs is recommended for people who: ? Currently smoke. ? Have quit within the past 15 years. ? Have at least a 30-pack-year history of smoking. A pack year is smoking an average of one pack of cigarettes a day for 1 year.  Yearly screening should continue until it has been 15 years since you quit.  Yearly screening should stop if you develop a health problem that would prevent you from having lung cancer treatment.  Breast Cancer  Practice breast self-awareness. This means understanding how your breasts normally appear and feel.  It also means doing regular breast self-exams. Let your  health care provider know about any changes, no matter how small.  If you are in your 20s or 30s, you should have a clinical breast exam (CBE) by a health care provider every 1-3 years as part of a regular health exam.  If you are 6 or older, have a CBE every year. Also consider having a breast X-ray (mammogram) every year.  If you have a family history of breast cancer, talk to your health care provider about genetic screening.  If you are at high risk for breast  cancer, talk to your health care provider about having an MRI and a mammogram every year.  Breast cancer gene (BRCA) assessment is recommended for women who have family members with BRCA-related cancers. BRCA-related cancers include: ? Breast. ? Ovarian. ? Tubal. ? Peritoneal cancers.  Results of the assessment will determine the need for genetic counseling and BRCA1 and BRCA2 testing.  Cervical Cancer Your health care provider may recommend that you be screened regularly for cancer of the pelvic organs (ovaries, uterus, and vagina). This screening involves a pelvic examination, including checking for microscopic changes to the surface of your cervix (Pap test). You may be encouraged to have this screening done every 3 years, beginning at age 80.  For women ages 35-65, health care providers may recommend pelvic exams and Pap testing every 3 years, or they may recommend the Pap and pelvic exam, combined with testing for human papilloma virus (HPV), every 5 years. Some types of HPV increase your risk of cervical cancer. Testing for HPV may also be done on women of any age with unclear Pap test results.  Other health care providers may not recommend any screening for nonpregnant women who are considered low risk for pelvic cancer and who do not have symptoms. Ask your health care provider if a screening pelvic exam is right for you.  If you have had past treatment for cervical cancer or a condition that could lead to cancer, you need Pap tests and screening for cancer for at least 20 years after your treatment. If Pap tests have been discontinued, your risk factors (such as having a new sexual partner) need to be reassessed to determine if screening should resume. Some women have medical problems that increase the chance of getting cervical cancer. In these cases, your health care provider may recommend more frequent screening and Pap tests.  Colorectal Cancer  This type of cancer can be detected  and often prevented.  Routine colorectal cancer screening usually begins at 64 years of age and continues through 64 years of age.  Your health care provider may recommend screening at an earlier age if you have risk factors for colon cancer.  Your health care provider may also recommend using home test kits to check for hidden blood in the stool.  A small camera at the end of a tube can be used to examine your colon directly (sigmoidoscopy or colonoscopy). This is done to check for the earliest forms of colorectal cancer.  Routine screening usually begins at age 64.  Direct examination of the colon should be repeated every 5-10 years through 64 years of age. However, you may need to be screened more often if early forms of precancerous polyps or small growths are found.  Skin Cancer  Check your skin from head to toe regularly.  Tell your health care provider about any new moles or changes in moles, especially if there is a change in a mole's shape or color.  Also  tell your health care provider if you have a mole that is larger than the size of a pencil eraser.  Always use sunscreen. Apply sunscreen liberally and repeatedly throughout the day.  Protect yourself by wearing long sleeves, pants, a wide-brimmed hat, and sunglasses whenever you are outside.  Heart disease, diabetes, and high blood pressure  High blood pressure causes heart disease and increases the risk of stroke. High blood pressure is more likely to develop in: ? People who have blood pressure in the high end of the normal range (130-139/85-89 mm Hg). ? People who are overweight or obese. ? People who are African American.  If you are 41-84 years of age, have your blood pressure checked every 3-5 years. If you are 39 years of age or older, have your blood pressure checked every year. You should have your blood pressure measured twice-once when you are at a hospital or clinic, and once when you are not at a hospital or  clinic. Record the average of the two measurements. To check your blood pressure when you are not at a hospital or clinic, you can use: ? An automated blood pressure machine at a pharmacy. ? A home blood pressure monitor.  If you are between 55 years and 70 years old, ask your health care provider if you should take aspirin to prevent strokes.  Have regular diabetes screenings. This involves taking a blood sample to check your fasting blood sugar level. ? If you are at a normal weight and have a low risk for diabetes, have this test once every three years after 65 years of age. ? If you are overweight and have a high risk for diabetes, consider being tested at a younger age or more often. Preventing infection Hepatitis B  If you have a higher risk for hepatitis B, you should be screened for this virus. You are considered at high risk for hepatitis B if: ? You were born in a country where hepatitis B is common. Ask your health care provider which countries are considered high risk. ? Your parents were born in a high-risk country, and you have not been immunized against hepatitis B (hepatitis B vaccine). ? You have HIV or AIDS. ? You use needles to inject street drugs. ? You live with someone who has hepatitis B. ? You have had sex with someone who has hepatitis B. ? You get hemodialysis treatment. ? You take certain medicines for conditions, including cancer, organ transplantation, and autoimmune conditions.  Hepatitis C  Blood testing is recommended for: ? Everyone born from [birth year] through 1963. ? Anyone with known risk factors for hepatitis C.  Sexually transmitted infections (STIs)  You should be screened for sexually transmitted infections (STIs) including gonorrhea and chlamydia if: ? You are sexually active and are younger than 64 years of age. ? You are older than 64 years of age and your health care provider tells you that you are at risk for this type of infection. ? Your  sexual activity has changed since you were last screened and you are at an increased risk for chlamydia or gonorrhea. Ask your health care provider if you are at risk.  If you do not have HIV, but are at risk, it may be recommended that you take a prescription medicine daily to prevent HIV infection. This is called pre-exposure prophylaxis (PrEP). You are considered at risk if: ? You are sexually active and do not regularly use condoms or know the HIV status of your  partner(s). ? You take drugs by injection. ? You are sexually active with a partner who has HIV.  Talk with your health care provider about whether you are at high risk of being infected with HIV. If you choose to begin PrEP, you should first be tested for HIV. You should then be tested every 3 months for as long as you are taking PrEP. Pregnancy  If you are premenopausal and you may become pregnant, ask your health care provider about preconception counseling.  If you may become pregnant, take 400 to 800 micrograms (mcg) of folic acid every day.  If you want to prevent pregnancy, talk to your health care provider about birth control (contraception). Osteoporosis and menopause  Osteoporosis is a disease in which the bones lose minerals and strength with aging. This can result in serious bone fractures. Your risk for osteoporosis can be identified using a bone density scan.  If you are 22 years of age or older, or if you are at risk for osteoporosis and fractures, ask your health care provider if you should be screened.  Ask your health care provider whether you should take a calcium or vitamin D supplement to lower your risk for osteoporosis.  Menopause may have certain physical symptoms and risks.  Hormone replacement therapy may reduce some of these symptoms and risks. Talk to your health care provider about whether hormone replacement therapy is right for you. Follow these instructions at home:  Schedule regular health,  dental, and eye exams.  Stay current with your immunizations.  Do not use any tobacco products including cigarettes, chewing tobacco, or electronic cigarettes.  If you are pregnant, do not drink alcohol.  If you are breastfeeding, limit how much and how often you drink alcohol.  Limit alcohol intake to no more than 1 drink per day for nonpregnant women. One drink equals 12 ounces of beer, 5 ounces of wine, or 1 ounces of hard liquor.  Do not use street drugs.  Do not share needles.  Ask your health care provider for help if you need support or information about quitting drugs.  Tell your health care provider if you often feel depressed.  Tell your health care provider if you have ever been abused or do not feel safe at home. This information is not intended to replace advice given to you by your health care provider. Make sure you discuss any questions you have with your health care provider. Document Released: 10/30/2010 Document Revised: 09/22/2015 Document Reviewed: 01/18/2015 Elsevier Interactive Patient Education  [employer].

## 2016-12-21 NOTE — Progress Notes (Signed)
Subjective:    Patient ID: Jaime Rose, female    DOB: 1950-10-22, 66 y.o.   MRN: [id number]  HPI She is here for a physical exam.    Her brother had a stroke and both her parents had a stroke.  She denies changes in her history.   She would like the shingles vaccine today.   Medications and allergies reviewed with patient and updated if appropriate.  Patient Active Problem List   Diagnosis Date Noted  . Hyperglycemia 12/21/2016  . Right carotid bruit 12/21/2016  . Hypothyroidism 03/20/2012  . 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 ALLERGIC RHINITIS 12/21/2009  . CARDIAC MURMUR, HX OF 12/21/2009  . CHICKENPOX, HX OF 12/21/2009    Current Outpatient Prescriptions on File Prior to Visit  Medication Sig Dispense Refill  . clobetasol ointment (TEMOVATE) 0.05 % Apply topically 2 (two) times daily. 30 g 5  . cycloSPORINE (RESTASIS) 0.05 % ophthalmic emulsion 2 drops every morning.    Marland Kitchen ibuprofen (ADVIL,MOTRIN) 800 MG tablet Take 1 tablet (800 mg total) by mouth 3 (three) times daily. 21 tablet 0  . levothyroxine (SYNTHROID, LEVOTHROID) 125 MCG tablet Take 1 tablet (125 mcg total) by mouth daily. Must keep appt for future refills 30 tablet 0  . loratadine (CLARITIN) 10 MG tablet Take 10 mg by mouth as needed.      No current facility-administered medications on file prior to visit.     Past Medical History:  Diagnosis Date  . ALLERGIC RHINITIS   . Hyperthyroidism    normal TFTs 11/2009 , dx 10/2011  . Varicose vein    s/p sclerosis tx summer 2013    Past Surgical History:  Procedure Laterality Date  . ENDOVENOUS ABLATION SAPHENOUS VEIN W/ LASER  04-11-2012   endovenous laser ablation right greater saphenous vein and stab phlebectomy  right leg  by Gretta Began MD    Social History   Social History  . Marital status: Single    Spouse name: N/A  . Number of children: N/A  . Years of education: N/A   Social History Main  Topics  . Smoking status: Former Smoker    Types: Cigarettes    Quit date: 04/30/1988  . Smokeless tobacco: Never Used  . Alcohol use 0.0 - 0.5 oz/week     Comment: 1-2 glasses a week  . Drug use: No  . Sexual activity: Not Asked   Other Topics Concern  . None   Social History Narrative   Single, lives with 2 dtrs- [place] & [place]      Exercising regularly - walking dogs    Family History  Problem Relation Age of Onset  . Heart disease Father   . Autoimmune disease Father   . Hypertension Father   . Other Father        varicose veins, AAA  . Stroke Father   . Hyperlipidemia Mother   . Other Mother        varicose veins, AAA  . Stroke Mother   . Heart attack Mother 48  . Stroke Brother   . Hyperlipidemia Brother   . Hyperlipidemia Brother   . Hyperlipidemia Sister   . Diabetes Maternal Grandfather   . Diabetes Paternal Grandfather     Review of Systems  Constitutional: Negative for appetite change, chills, fatigue and fever.  Eyes: Negative for visual disturbance.  Respiratory: Negative for cough, shortness of breath and wheezing.   Cardiovascular: Negative for chest pain,  palpitations and leg swelling.  Gastrointestinal: Negative for abdominal pain, blood in stool, constipation, diarrhea and nausea.       No gerd  Genitourinary: Negative for dysuria and hematuria.  Musculoskeletal: Positive for arthralgias (left shoulder, left arthritis). Negative for back pain and myalgias.  Skin: Negative for color change and rash.  Neurological: Negative for light-headedness and headaches.  Psychiatric/Behavioral: Negative for dysphoric mood. The patient is not nervous/anxious.        Objective:   Vitals:   12/21/16 0829  BP: 130/82  Pulse: 61  Resp: 16  Temp: 97.8 F (36.6 C)  SpO2: 98%   Wt Readings from Last 3 Encounters:  12/21/16 179 lb (81.2 kg)  06/07/15 175 lb (79.4 kg)  02/17/14 171 lb (77.6 kg)   Body mass index is 28.89 kg/m.   Physical Exam      Constitutional: She appears well-developed and well-nourished. No distress.  HENT:  Head: Normocephalic and atraumatic.  Right Ear: External ear normal. Normal ear canal and TM Left Ear: External ear normal.  Normal ear canal and TM Mouth/Throat: Oropharynx is clear and moist.  Eyes: Conjunctivae and EOM are normal.  Neck: Neck supple. No tracheal deviation present. No thyromegaly present.  Right carotid bruit  Cardiovascular: Normal rate, regular rhythm and normal heart sounds.   No murmur heard.  No edema. Pulmonary/Chest: Effort normal and breath sounds normal. No respiratory distress. She has no wheezes. She has no rales.  Breast: deferred to Gyn Abdominal: Soft. She exhibits no distension. There is no tenderness.  Lymphadenopathy: She has no cervical adenopathy.  Skin: Skin is warm and dry. She is not diaphoretic.  Psychiatric: She has a normal mood and affect. Her behavior is normal.     Assessment & Plan:    Physical exam: Screening blood work   ordered Immunizations  Discussed shingrix - will need to get it at the pharmacy, recommended flu vaccine Colonoscopy   Never had one - would like to do cologuard Mammogram    Up to date  - due this year, will schedule Gyn  Will schedule - pap due Dexa  Last one years ago - will ordere Eye exams  Up to date  EKG       Last done 2013 Exercise  Walking regularly, will try to add weights/on -line work out Weight        advised weight loss Skin     No concerns Substance abuse     none  See Problem List for Assessment and Plan of chronic medical problems.   FU annually

## 2016-12-21 NOTE — Assessment & Plan Note (Signed)
Check a1c Low sugar / carb diet Stressed regular exercise   

## 2016-12-25 ENCOUNTER — Telehealth: Payer: Self-pay

## 2016-12-25 NOTE — Telephone Encounter (Signed)
Patient advised that her name has been placed on shingrix wait list--I will call her when I have vaccine available for her

## 2017-01-05 ENCOUNTER — Other Ambulatory Visit: Payer: Self-pay | Admitting: Internal Medicine

## 2017-01-07 MED ORDER — LEVOTHYROXINE SODIUM 125 MCG PO TABS: 125.0000 ug | ORAL_TABLET | Freq: Every day | ORAL | 3 refills | Status: DC

## 2017-01-09 ENCOUNTER — Ambulatory Visit (INDEPENDENT_AMBULATORY_CARE_PROVIDER_SITE_OTHER)
Admission: RE | Admit: 2017-01-09 | Discharge: 2017-01-09 | Disposition: A | Discharge status: Home / Self Care | Payer: BC Managed Care – PPO | Source: Ambulatory Visit | Attending: Internal Medicine | Admitting: Internal Medicine

## 2017-01-09 DIAGNOSIS — Z13820 Encounter for screening for osteoporosis: Secondary | ICD-10-CM

## 2017-01-09 DIAGNOSIS — E2839 Other primary ovarian failure: Secondary | ICD-10-CM

## 2017-01-10 ENCOUNTER — Encounter: Payer: Self-pay | Admitting: Internal Medicine

## 2017-01-10 DIAGNOSIS — E2839 Other primary ovarian failure: Secondary | ICD-10-CM | POA: Diagnosis not present

## 2017-01-10 DIAGNOSIS — M8580 Other specified disorders of bone density and structure, unspecified site: Secondary | ICD-10-CM | POA: Insufficient documentation

## 2017-01-16 ENCOUNTER — Ambulatory Visit (HOSPITAL_COMMUNITY)
Admission: RE | Admit: 2017-01-16 | Discharge: 2017-01-16 | Disposition: A | Discharge status: Home / Self Care | Payer: BC Managed Care – PPO | Source: Ambulatory Visit | Attending: Cardiovascular Disease | Admitting: Cardiovascular Disease

## 2017-01-16 DIAGNOSIS — R0989 Other specified symptoms and signs involving the circulatory and respiratory systems: Secondary | ICD-10-CM | POA: Insufficient documentation

## 2017-01-17 ENCOUNTER — Encounter: Payer: Self-pay | Admitting: Internal Medicine

## 2017-01-28 ENCOUNTER — Ambulatory Visit (INDEPENDENT_AMBULATORY_CARE_PROVIDER_SITE_OTHER): Payer: BC Managed Care – PPO

## 2017-01-28 DIAGNOSIS — Z299 Encounter for prophylactic measures, unspecified: Secondary | ICD-10-CM

## 2017-01-30 ENCOUNTER — Telehealth: Payer: Self-pay | Admitting: Internal Medicine

## 2017-01-30 NOTE — Progress Notes (Signed)
Subjective:    Patient ID: Jaime Rose, female    DOB: 08-Jan-1951, 66 y.o.   MRN: [id number]  HPI She is here for an acute visit.   Three days ago she the received the first shingrix vaccine here.  The next day she woke up with redeness and pain at the site of the injection. The next day it was less red and painful and today it is even better.  The area is slightly red. She was not concerned, but had a TB test at work and showed the nurse that was giving it to her. She thought she needed to have it looked at. She denies any fevers or chills and otherwise feels well.     Medications and allergies reviewed with patient and updated if appropriate.  Patient Active Problem List   Diagnosis Date Noted  . Osteopenia 01/10/2017  . Hyperglycemia 12/21/2016  . Right carotid bruit 12/21/2016  . Prediabetes 12/21/2016  . Hypothyroidism 03/20/2012  . 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 ALLERGIC RHINITIS 12/21/2009  . CARDIAC MURMUR, HX OF 12/21/2009    Current Outpatient Prescriptions on File Prior to Visit  Medication Sig Dispense Refill  . clobetasol ointment (TEMOVATE) 0.05 % Apply topically 2 (two) times daily. 30 g 5  . cycloSPORINE (RESTASIS) 0.05 % ophthalmic emulsion 2 drops every morning.    Marland Kitchen ibuprofen (ADVIL,MOTRIN) 800 MG tablet Take 1 tablet (800 mg total) by mouth 3 (three) times daily. 21 tablet 0  . levothyroxine (SYNTHROID, LEVOTHROID) 125 MCG tablet Take 1 tablet (125 mcg total) by mouth daily. Must keep appt for future refills 90 tablet 3  . loratadine (CLARITIN) 10 MG tablet Take 10 mg by mouth as needed.      No current facility-administered medications on file prior to visit.     Past Medical History:  Diagnosis Date  . ALLERGIC RHINITIS   . CHICKENPOX, HX OF 12/21/2009   Qualifier: Diagnosis of  By: Felicity Coyer MD, Raenette Rover Hyperthyroidism    normal TFTs 11/2009 , dx 10/2011  . Varicose vein    s/p sclerosis tx  summer 2013    Past Surgical History:  Procedure Laterality Date  . ENDOVENOUS ABLATION SAPHENOUS VEIN W/ LASER  04-11-2012   endovenous laser ablation right greater saphenous vein and stab phlebectomy  right leg  by Gretta Began MD    Social History   Social History  . Marital status: Single    Spouse name: N/A  . Number of children: N/A  . Years of education: N/A   Social History Main Topics  . Smoking status: Former Smoker    Types: Cigarettes    Quit date: 04/30/1988  . Smokeless tobacco: Never Used  . Alcohol use 0.0 - 0.5 oz/week     Comment: 1-2 glasses a week  . Drug use: No  . Sexual activity: Not Asked   Other Topics Concern  . None   Social History Narrative   Single, lives with 2 dtrs- [place] & [place]      Exercising regularly - walking dogs    Family History  Problem Relation Age of Onset  . Heart disease Father   . Autoimmune disease Father   . Hypertension Father   . Other Father        varicose veins, AAA  . Stroke Father   . Hyperlipidemia Mother   . Other Mother        varicose veins, AAA  .  Stroke Mother   . Heart attack Mother 85  . Stroke Brother   . Hyperlipidemia Brother   . Hyperlipidemia Brother   . Hyperlipidemia Sister   . Diabetes Maternal Grandfather   . Diabetes Paternal Grandfather     Review of Systems  Constitutional: Negative for chills and fever.  Skin: Positive for color change (warm and pain - mild). Negative for rash and wound.  Neurological: Negative for light-headedness and headaches.       Objective:   Vitals:   01/31/17 1021  BP: 134/72  Pulse: 78  Resp: 16  Temp: 98.3 F (36.8 C)  SpO2: 98%   Filed Weights   01/31/17 1021  Weight: 176 lb (79.8 kg)   Body mass index is 28.41 kg/m.  Wt Readings from Last 3 Encounters:  01/31/17 176 lb (79.8 kg)  12/21/16 179 lb (81.2 kg)  06/07/15 175 lb (79.4 kg)     Physical Exam  Constitutional: She appears well-developed and well-nourished. No distress.    Musculoskeletal: She exhibits no edema.  Neurological:  Normal sensation RUE  Skin: She is not diaphoretic. There is erythema (2 cm x 6 cm area of erythema on right upper arm where injection was given - slightly warm to touch and mildly tender; no swelling, no wound).          Assessment & Plan:   See Problem List for Assessment and Plan of chronic medical problems.

## 2017-01-30 NOTE — Telephone Encounter (Signed)
Patient Name: [facility] A DOB: 09-30-1950 Initial Comment Lorianna states she had the shingles vaccine on Monday. She has a red mark on her arm at the site of the injection now. Nurse Assessment Nurse: Reed Pandy, RN, Amy Date/Time Lamount Cohen Time): 01/30/2017 12:13:24 PM Confirm and document reason for call. If symptomatic, describe symptoms. ---Caller states she had the shingles vaccine on Monday. Red area is 1 1/2 wide X 3 inches long. Noticed the redness yesterday morning, skin feels warm. Says the redness has not spread since yesterday. No drainage to the area. No fever. Area is tender to touch. Not itchy. Does the patient have any new or worsening symptoms? ---Yes Will a triage be completed? ---Yes Related visit to physician within the last 2 weeks? ---Yes Does the PT have any chronic conditions? (i.e. diabetes, asthma, etc.) ---Yes List chronic conditions. ---Hypothyroidism Is this a behavioral health or substance abuse call? ---No Guidelines Guideline Title Affirmed Question Affirmed Notes Immunization Reactions [1] Redness or red streak around the injection site AND [2] begins > 48 hours after shot AND [3] no fever (Exception: red area < 1 inch or 2.5 cm wide) Final Disposition User See Physician within 24 Hours Ramsey, RN, Amy Comments Appt schedule with PCP tomorrow 10/4 at 10am Referrals REFERRED TO PCP OFFICE Caller Disagree/Comply Comply Caller Understands Yes PreDisposition Home Care

## 2017-01-31 ENCOUNTER — Ambulatory Visit (INDEPENDENT_AMBULATORY_CARE_PROVIDER_SITE_OTHER): Payer: BC Managed Care – PPO | Admitting: Internal Medicine

## 2017-01-31 ENCOUNTER — Encounter: Payer: Self-pay | Admitting: Internal Medicine

## 2017-01-31 VITALS — BP 134/72 | HR 78 | Temp 98.3°F | Resp 16 | Wt 176.0 lb

## 2017-01-31 DIAGNOSIS — T8090XA Unspecified complication following infusion and therapeutic injection, initial encounter: Secondary | ICD-10-CM | POA: Insufficient documentation

## 2017-01-31 NOTE — Patient Instructions (Signed)
Your reaction is normal with the shingles injection.  The redness and pain will improve over the next few days.  If it does not please call.

## 2017-01-31 NOTE — Assessment & Plan Note (Signed)
Related to first shingrix injection Discussed that pain, redness and warmth are common side effects and will improved over the next few days Symptoms are already improving and mild so no treatment is needed

## 2017-05-13 ENCOUNTER — Ambulatory Visit: Payer: Self-pay

## 2017-05-21 ENCOUNTER — Ambulatory Visit (INDEPENDENT_AMBULATORY_CARE_PROVIDER_SITE_OTHER): Payer: BC Managed Care – PPO | Admitting: *Deleted

## 2017-05-21 ENCOUNTER — Ambulatory Visit: Payer: Self-pay

## 2017-05-21 DIAGNOSIS — Z23 Encounter for immunization: Secondary | ICD-10-CM

## 2017-07-26 ENCOUNTER — Telehealth: Payer: Self-pay | Admitting: Emergency Medicine

## 2017-07-26 DIAGNOSIS — Z1211 Encounter for screening for malignant neoplasm of colon: Secondary | ICD-10-CM

## 2017-07-26 NOTE — Telephone Encounter (Signed)
Order placed for Cologuard, pt notified via MyChart. Will fax on Monday with MDs signature.

## 2017-11-21 ENCOUNTER — Other Ambulatory Visit: Payer: Self-pay | Admitting: Internal Medicine

## 2017-11-21 DIAGNOSIS — Z1231 Encounter for screening mammogram for malignant neoplasm of breast: Secondary | ICD-10-CM

## 2017-12-23 ENCOUNTER — Ambulatory Visit
Admission: RE | Admit: 2017-12-23 | Discharge: 2017-12-23 | Disposition: A | Discharge status: Home / Self Care | Payer: BC Managed Care – PPO | Source: Ambulatory Visit | Attending: Internal Medicine | Admitting: Internal Medicine

## 2017-12-23 DIAGNOSIS — Z1231 Encounter for screening mammogram for malignant neoplasm of breast: Secondary | ICD-10-CM

## 2017-12-24 NOTE — Patient Instructions (Addendum)
Test(s) ordered today. Your results will be released to [place] (or called to you) after review, usually within 72hours after test completion. If any changes need to be made, you will be notified at that same time.  All other Health Maintenance issues reviewed.   All recommended immunizations and age-appropriate screenings are up-to-date or discussed.  Prevnar immunization administered today.   Medications reviewed and updated.  No changes recommended at this time.   Please followup in one year for a physical   Health Maintenance, Female Adopting a healthy lifestyle and getting preventive care can go a long way to promote health and wellness. Talk with your health care provider about what schedule of regular examinations is right for you. This is a good chance for you to check in with your provider about disease prevention and staying healthy. In between checkups, there are plenty of things you can do on your own. Experts have done a lot of research about which lifestyle changes and preventive measures are most likely to keep you healthy. Ask your health care provider for more information. Weight and diet Eat a healthy diet  Be sure to include plenty of vegetables, fruits, low-fat dairy products, and lean protein.  Do not eat a lot of foods high in solid fats, added sugars, or salt.  Get regular exercise. This is one of the most important things you can do for your health. ? Most adults should exercise for at least 150 minutes each week. The exercise should increase your heart rate and make you sweat (moderate-intensity exercise). ? Most adults should also do strengthening exercises at least twice a week. This is in addition to the moderate-intensity exercise.  Maintain a healthy weight  Body mass index (BMI) is a measurement that can be used to identify possible weight problems. It estimates body fat based on height and weight. Your health care provider can help determine your BMI and  help you achieve or maintain a healthy weight.  For females 58 years of age and older: ? A BMI below 18.5 is considered underweight. ? A BMI of 18.5 to 24.9 is normal. ? A BMI of 25 to 29.9 is considered overweight. ? A BMI of 30 and above is considered obese.  Watch levels of cholesterol and blood lipids  You should start having your blood tested for lipids and cholesterol at 65 years of age, then have this test every 5 years.  You may need to have your cholesterol levels checked more often if: ? Your lipid or cholesterol levels are high. ? You are older than 65 years of age. ? You are at high risk for heart disease.  Cancer screening Lung Cancer  Lung cancer screening is recommended for adults 72-3 years old who are at high risk for lung cancer because of a history of smoking.  A yearly low-dose CT scan of the lungs is recommended for people who: ? Currently smoke. ? Have quit within the past 15 years. ? Have at least a 30-pack-year history of smoking. A pack year is smoking an average of one pack of cigarettes a day for 1 year.  Yearly screening should continue until it has been 15 years since you quit.  Yearly screening should stop if you develop a health problem that would prevent you from having lung cancer treatment.  Breast Cancer  Practice breast self-awareness. This means understanding how your breasts normally appear and feel.  It also means doing regular breast self-exams. Let your health care provider know  about any changes, no matter how small.  If you are in your 20s or 30s, you should have a clinical breast exam (CBE) by a health care provider every 1-3 years as part of a regular health exam.  If you are 66 or older, have a CBE every year. Also consider having a breast X-ray (mammogram) every year.  If you have a family history of breast cancer, talk to your health care provider about genetic screening.  If you are at high risk for breast cancer, talk to  your health care provider about having an MRI and a mammogram every year.  Breast cancer gene (BRCA) assessment is recommended for women who have family members with BRCA-related cancers. BRCA-related cancers include: ? Breast. ? Ovarian. ? Tubal. ? Peritoneal cancers.  Results of the assessment will determine the need for genetic counseling and BRCA1 and BRCA2 testing.  Cervical Cancer Your health care provider may recommend that you be screened regularly for cancer of the pelvic organs (ovaries, uterus, and vagina). This screening involves a pelvic examination, including checking for microscopic changes to the surface of your cervix (Pap test). You may be encouraged to have this screening done every 3 years, beginning at age 31.  For women ages 49-65, health care providers may recommend pelvic exams and Pap testing every 3 years, or they may recommend the Pap and pelvic exam, combined with testing for human papilloma virus (HPV), every 5 years. Some types of HPV increase your risk of cervical cancer. Testing for HPV may also be done on women of any age with unclear Pap test results.  Other health care providers may not recommend any screening for nonpregnant women who are considered low risk for pelvic cancer and who do not have symptoms. Ask your health care provider if a screening pelvic exam is right for you.  If you have had past treatment for cervical cancer or a condition that could lead to cancer, you need Pap tests and screening for cancer for at least 20 years after your treatment. If Pap tests have been discontinued, your risk factors (such as having a new sexual partner) need to be reassessed to determine if screening should resume. Some women have medical problems that increase the chance of getting cervical cancer. In these cases, your health care provider may recommend more frequent screening and Pap tests.  Colorectal Cancer  This type of cancer can be detected and often  prevented.  Routine colorectal cancer screening usually begins at 65 years of age and continues through 65 years of age.  Your health care provider may recommend screening at an earlier age if you have risk factors for colon cancer.  Your health care provider may also recommend using home test kits to check for hidden blood in the stool.  A small camera at the end of a tube can be used to examine your colon directly (sigmoidoscopy or colonoscopy). This is done to check for the earliest forms of colorectal cancer.  Routine screening usually begins at age 103.  Direct examination of the colon should be repeated every 5-10 years through 65 years of age. However, you may need to be screened more often if early forms of precancerous polyps or small growths are found.  Skin Cancer  Check your skin from head to toe regularly.  Tell your health care provider about any new moles or changes in moles, especially if there is a change in a mole's shape or color.  Also tell your health care  provider if you have a mole that is larger than the size of a pencil eraser.  Always use sunscreen. Apply sunscreen liberally and repeatedly throughout the day.  Protect yourself by wearing long sleeves, pants, a wide-brimmed hat, and sunglasses whenever you are outside.  Heart disease, diabetes, and high blood pressure  High blood pressure causes heart disease and increases the risk of stroke. High blood pressure is more likely to develop in: ? People who have blood pressure in the high end of the normal range (130-139/85-89 mm Hg). ? People who are overweight or obese. ? People who are African American.  If you are 44-8 years of age, have your blood pressure checked every 3-5 years. If you are 13 years of age or older, have your blood pressure checked every year. You should have your blood pressure measured twice-once when you are at a hospital or clinic, and once when you are not at a hospital or clinic.  Record the average of the two measurements. To check your blood pressure when you are not at a hospital or clinic, you can use: ? An automated blood pressure machine at a pharmacy. ? A home blood pressure monitor.  If you are between 14 years and 83 years old, ask your health care provider if you should take aspirin to prevent strokes.  Have regular diabetes screenings. This involves taking a blood sample to check your fasting blood sugar level. ? If you are at a normal weight and have a low risk for diabetes, have this test once every three years after 65 years of age. ? If you are overweight and have a high risk for diabetes, consider being tested at a younger age or more often. Preventing infection Hepatitis B  If you have a higher risk for hepatitis B, you should be screened for this virus. You are considered at high risk for hepatitis B if: ? You were born in a country where hepatitis B is common. Ask your health care provider which countries are considered high risk. ? Your parents were born in a high-risk country, and you have not been immunized against hepatitis B (hepatitis B vaccine). ? You have HIV or AIDS. ? You use needles to inject street drugs. ? You live with someone who has hepatitis B. ? You have had sex with someone who has hepatitis B. ? You get hemodialysis treatment. ? You take certain medicines for conditions, including cancer, organ transplantation, and autoimmune conditions.  Hepatitis C  Blood testing is recommended for: ? Everyone born from 65 through 1965. ? Anyone with known risk factors for hepatitis C.  Sexually transmitted infections (STIs)  You should be screened for sexually transmitted infections (STIs) including gonorrhea and chlamydia if: ? You are sexually active and are younger than 65 years of age. ? You are older than 65 years of age and your health care provider tells you that you are at risk for this type of infection. ? Your sexual  activity has changed since you were last screened and you are at an increased risk for chlamydia or gonorrhea. Ask your health care provider if you are at risk.  If you do not have HIV, but are at risk, it may be recommended that you take a prescription medicine daily to prevent HIV infection. This is called pre-exposure prophylaxis (PrEP). You are considered at risk if: ? You are sexually active and do not regularly use condoms or know the HIV status of your partner(s). ? You take  drugs by injection. ? You are sexually active with a partner who has HIV.  Talk with your health care provider about whether you are at high risk of being infected with HIV. If you choose to begin PrEP, you should first be tested for HIV. You should then be tested every 3 months for as long as you are taking PrEP. Pregnancy  If you are premenopausal and you may become pregnant, ask your health care provider about preconception counseling.  If you may become pregnant, take 400 to 800 micrograms (mcg) of folic acid every day.  If you want to prevent pregnancy, talk to your health care provider about birth control (contraception). Osteoporosis and menopause  Osteoporosis is a disease in which the bones lose minerals and strength with aging. This can result in serious bone fractures. Your risk for osteoporosis can be identified using a bone density scan.  If you are 28 years of age or older, or if you are at risk for osteoporosis and fractures, ask your health care provider if you should be screened.  Ask your health care provider whether you should take a calcium or vitamin D supplement to lower your risk for osteoporosis.  Menopause may have certain physical symptoms and risks.  Hormone replacement therapy may reduce some of these symptoms and risks. Talk to your health care provider about whether hormone replacement therapy is right for you. Follow these instructions at home:  Schedule regular health, dental,  and eye exams.  Stay current with your immunizations.  Do not use any tobacco products including cigarettes, chewing tobacco, or electronic cigarettes.  If you are pregnant, do not drink alcohol.  If you are breastfeeding, limit how much and how often you drink alcohol.  Limit alcohol intake to no more than 1 drink per day for nonpregnant women. One drink equals 12 ounces of beer, 5 ounces of wine, or 1 ounces of hard liquor.  Do not use street drugs.  Do not share needles.  Ask your health care provider for help if you need support or information about quitting drugs.  Tell your health care provider if you often feel depressed.  Tell your health care provider if you have ever been abused or do not feel safe at home. This information is not intended to replace advice given to you by your health care provider. Make sure you discuss any questions you have with your health care provider. Document Released: 10/30/2010 Document Revised: 09/22/2015 Document Reviewed: 01/18/2015 Elsevier Interactive Patient Education  [employer].

## 2017-12-24 NOTE — Progress Notes (Signed)
Subjective:    Patient ID: Jaime Rose, female    DOB: 13-Feb-1951, 67 y.o.   MRN: [id number]  HPI Here for  an annual physical exam.   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  She does urinate a lot.  She drinks a lot - 2 16 oz of hot tea.  She also drinks a lot of water.  She denies dysuria, hematuria and incontinence.    Overall she feels well and has no major concerns.  She is exercising regularly-walking.  She has changed her eating habits and has lost weight.  Overall she feels great.  Medications and allergies reviewed with patient and updated if appropriate.  Patient Active Problem List   Diagnosis Date Noted  . Injection site reaction 01/31/2017  . Osteopenia 01/10/2017  . Hyperglycemia 12/21/2016  . Right carotid bruit 12/21/2016  . Prediabetes 12/21/2016  . Hypothyroidism 03/20/2012  . Varicose veins of lower extremities with other complications 68/02/5725  . ALLERGIC RHINITIS 12/21/2009  . CARDIAC MURMUR, HX OF 12/21/2009    Current Outpatient Medications on File Prior to Visit  Medication Sig Dispense Refill  . clobetasol ointment (TEMOVATE) 0.05 % Apply topically 2 (two) times daily. 30 g 5  . cycloSPORINE (RESTASIS) 0.05 % ophthalmic emulsion 2 drops every morning.    Marland Kitchen ibuprofen (ADVIL,MOTRIN) 800 MG tablet Take 1 tablet (800 mg total) by mouth 3 (three) times daily. 21 tablet 0  . levothyroxine (SYNTHROID, LEVOTHROID) 125 MCG tablet Take 1 tablet (125 mcg total) by mouth daily. Must keep appt for future refills 90 tablet 3  . loratadine (CLARITIN) 10 MG tablet Take 10 mg by mouth as needed.      No current facility-administered medications on file prior to visit.     Past Medical History:  Diagnosis Date  . ALLERGIC RHINITIS   . CHICKENPOX, HX OF 12/21/2009   Qualifier: Diagnosis of  By: Asa Lente MD, Jannifer Rodney Hyperthyroidism    normal TFTs 11/2009 , dx 10/2011  . Varicose vein    s/p sclerosis tx summer 2013    Past Surgical  History:  Procedure Laterality Date  . ENDOVENOUS ABLATION SAPHENOUS VEIN W/ LASER  04-11-2012   endovenous laser ablation right greater saphenous vein and stab phlebectomy  right leg  by Curt Jews MD    Social History   Socioeconomic History  . Marital status: Single    Spouse name: Not on file  . Number of children: Not on file  . Years of education: Not on file  . Highest education level: Not on file  Occupational History  . Not on file  Social Needs  . Financial resource strain: Not on file  . Food insecurity:    Worry: Not on file    Inability: Not on file  . Transportation needs:    Medical: Not on file    Non-medical: Not on file  Tobacco Use  . Smoking status: Former Smoker    Types: Cigarettes    Last attempt to quit: 04/30/1988    Years since quitting: 29.6  . Smokeless tobacco: Never Used  Substance and Sexual Activity  . Alcohol use: Yes    Alcohol/week: 0.0 - 1.0 standard drinks    Comment: 1-2 glasses a week  . Drug use: No  . Sexual activity: Not on file  Lifestyle  . Physical activity:    Days per week: Not on file    Minutes per session: Not on file  .  Stress: Not on file  Relationships  . Social connections:    Talks on phone: Not on file    Gets together: Not on file    Attends religious service: Not on file    Active member of club or organization: Not on file    Attends meetings of clubs or organizations: Not on file    Relationship status: Not on file  Other Topics Concern  . Not on file  Social History Narrative   Single, lives with 2 dtrs- [place]      Exercising regularly - walking dogs    Family History  Problem Relation Age of Onset  . Heart disease Father   . Autoimmune disease Father   . Hypertension Father   . Other Father        varicose veins, AAA  . Stroke Father   . Hyperlipidemia Mother   . Other Mother        varicose veins, AAA  . Stroke Mother   . Heart attack Mother 30  . Stroke Brother   .  Hyperlipidemia Brother   . Hyperlipidemia Brother   . Hyperlipidemia Sister   . Diabetes Maternal Grandfather   . Diabetes Paternal Grandfather   . Breast cancer Maternal Aunt     Review of Systems  Constitutional: Negative for chills, fatigue and fever.  Eyes: Negative for visual disturbance.  Respiratory: Negative for cough, shortness of breath and wheezing.   Cardiovascular: Negative for chest pain, palpitations and leg swelling.  Gastrointestinal: Negative for abdominal pain, blood in stool, constipation, diarrhea and nausea.       No gerd  Genitourinary: Positive for frequency. Negative for dysuria and hematuria.       Incontinence  Musculoskeletal: Positive for arthralgias (left shoulder). Negative for back pain.  Skin: Negative for color change and rash.  Neurological: Negative for dizziness, light-headedness, numbness and headaches.  Psychiatric/Behavioral: Negative for dysphoric mood and sleep disturbance. The patient is not nervous/anxious.        Objective:   Vitals:   12/25/17 1502  BP: 134/72  Pulse: (!) 55  Resp: 16  Temp: 97.7 F (36.5 C)  SpO2: 97%   Filed Weights   12/25/17 1502  Weight: 171 lb 12.8 oz (77.9 kg)   Body mass index is 27.73 kg/m.  Wt Readings from Last 3 Encounters:  12/25/17 171 lb 12.8 oz (77.9 kg)  01/31/17 176 lb (79.8 kg)  12/21/16 179 lb (81.2 kg)    No exam data present    Physical Exam Constitutional: She appears well-developed and well-nourished. No distress.  HENT:  Head: Normocephalic and atraumatic.  Right Ear: External ear normal. Normal ear canal and TM Left Ear: External ear normal.  Normal ear canal and TM Mouth/Throat: Oropharynx is clear and moist.  Eyes: Conjunctivae and EOM are normal.  Neck: Neck supple. No tracheal deviation present. No thyromegaly present.  No carotid bruit  Cardiovascular: Normal rate, regular rhythm and normal heart sounds.   1/6 systolic murmur heard.  No edema. Pulmonary/Chest:  Effort normal and breath sounds normal. No respiratory distress. She has no wheezes. She has no rales.  Breast: deferred to Gyn Abdominal: Soft. She exhibits no distension. There is no tenderness.  Lymphadenopathy: She has no cervical adenopathy.  Skin: Skin is warm and dry. She is not diaphoretic.  Psychiatric: She has a normal mood and affect. Her behavior is normal.        Assessment & Plan:    Physical exam:  Screening blood work  ordered Immunizations  Flu at work, Dietitian today, others up to date Colonoscopy - has the [employer] - will do it Mammogram  Up to date  Gyn   Not up  Dexa  Osteopenia - done 2018 Eye exams   Up to date  EKG   Done 10/2011 Exercise  Walks at least an hour a day Weight  Has lost some weight Skin  No concerns Substance abuse  none  See Problem List for Assessment and Plan of chronic medical problems.   FU in one year

## 2017-12-25 ENCOUNTER — Encounter: Payer: Self-pay | Admitting: Internal Medicine

## 2017-12-25 ENCOUNTER — Ambulatory Visit (INDEPENDENT_AMBULATORY_CARE_PROVIDER_SITE_OTHER): Payer: BC Managed Care – PPO | Admitting: Internal Medicine

## 2017-12-25 ENCOUNTER — Other Ambulatory Visit (INDEPENDENT_AMBULATORY_CARE_PROVIDER_SITE_OTHER): Payer: BC Managed Care – PPO

## 2017-12-25 VITALS — BP 134/72 | HR 55 | Temp 97.7°F | Resp 16 | Ht 66.0 in | Wt 171.8 lb

## 2017-12-25 DIAGNOSIS — Z0000 Encounter for general adult medical examination without abnormal findings: Secondary | ICD-10-CM

## 2017-12-25 DIAGNOSIS — R7303 Prediabetes: Secondary | ICD-10-CM | POA: Diagnosis not present

## 2017-12-25 DIAGNOSIS — Z23 Encounter for immunization: Secondary | ICD-10-CM | POA: Diagnosis not present

## 2017-12-25 DIAGNOSIS — M85859 Other specified disorders of bone density and structure, unspecified thigh: Secondary | ICD-10-CM

## 2017-12-25 DIAGNOSIS — R011 Cardiac murmur, unspecified: Secondary | ICD-10-CM

## 2017-12-25 DIAGNOSIS — E039 Hypothyroidism, unspecified: Secondary | ICD-10-CM

## 2017-12-25 LAB — COMPREHENSIVE METABOLIC PANEL
ALK PHOS: 78 U/L (ref 39–117)
ALT: 11 U/L (ref 0–35)
AST: 11 U/L (ref 0–37)
Albumin: 4 g/dL (ref 3.5–5.2)
BUN: 16 mg/dL (ref 6–23)
CO2: 31 mEq/L (ref 19–32)
Calcium: 9.8 mg/dL (ref 8.4–10.5)
Chloride: 104 mEq/L (ref 96–112)
Creatinine, Ser: 0.75 mg/dL (ref 0.40–1.20)
GFR: 82.42 mL/min (ref >60.00)
GLUCOSE: 87 mg/dL (ref 70–99)
POTASSIUM: 4.5 meq/L (ref 3.5–5.1)
SODIUM: 140 meq/L (ref 135–145)
TOTAL PROTEIN: 7.3 g/dL (ref 6.0–8.3)
Total Bilirubin: 0.5 mg/dL (ref 0.2–1.2)

## 2017-12-25 LAB — CBC WITH DIFFERENTIAL/PLATELET
BASOS PCT: 0.7 % (ref 0.0–3.0)
Basophils Absolute: 0.1 10*3/uL (ref 0.0–0.1)
Eosinophils Absolute: 0.2 10*3/uL (ref 0.0–0.7)
Eosinophils Relative: 2.6 % (ref 0.0–5.0)
HCT: 37.3 % (ref 36.0–46.0)
HEMOGLOBIN: 12.2 g/dL (ref 12.0–15.0)
LYMPHS ABS: 2.2 10*3/uL (ref 0.7–4.0)
Lymphocytes Relative: 30.3 % (ref 12.0–46.0)
MCHC: 32.8 g/dL (ref 30.0–36.0)
MCV: 87.3 fl (ref 78.0–100.0)
MONO ABS: 0.7 10*3/uL (ref 0.1–1.0)
MONOS PCT: 10.1 % (ref 3.0–12.0)
NEUTROS PCT: 56.3 % (ref 43.0–77.0)
Neutro Abs: 4.1 10*3/uL (ref 1.4–7.7)
Platelets: 316 10*3/uL (ref 150.0–400.0)
RBC: 4.27 Mil/uL (ref 3.87–5.11)
RDW: 13.5 % (ref 11.5–15.5)
WBC: 7.4 10*3/uL (ref 4.0–10.5)

## 2017-12-25 LAB — LIPID PANEL
CHOL/HDL RATIO: 2
Cholesterol: 182 mg/dL (ref 0–200)
HDL: 77.3 mg/dL (ref >39.00)
LDL CALC: 93 mg/dL (ref 0–99)
NonHDL: 104.55
Triglycerides: 58 mg/dL (ref 0.0–149.0)
VLDL: 11.6 mg/dL (ref 0.0–40.0)

## 2017-12-25 LAB — HEMOGLOBIN A1C: HEMOGLOBIN A1C: 5.8 % (ref 4.6–6.5)

## 2017-12-25 LAB — TSH: TSH: 0.04 u[IU]/mL — AB (ref 0.35–4.50)

## 2017-12-25 NOTE — Assessment & Plan Note (Signed)
Compliant with a low sugar/carbohydrate diet She is exercising regularly and has lost weight Continue above A1c

## 2017-12-25 NOTE — Assessment & Plan Note (Signed)
DEXA done 2018 Exercising regularly Drinks a lot of milk and gets good amount of calcium in her diet

## 2017-12-25 NOTE — Assessment & Plan Note (Signed)
Check tsh  Titrate med dose if needed  

## 2017-12-25 NOTE — Assessment & Plan Note (Signed)
1/6 systolic murmur We will monitor-no further evaluation needed

## 2018-01-09 ENCOUNTER — Other Ambulatory Visit: Payer: Self-pay | Admitting: Internal Medicine

## 2018-01-10 ENCOUNTER — Other Ambulatory Visit: Payer: Self-pay

## 2018-01-10 MED ORDER — LEVOTHYROXINE SODIUM 112 MCG PO TABS: 112.0000 ug | ORAL_TABLET | Freq: Every day | ORAL | 1 refills | Status: DC

## 2018-04-14 ENCOUNTER — Telehealth: Payer: Self-pay | Admitting: *Deleted

## 2018-04-14 DIAGNOSIS — E039 Hypothyroidism, unspecified: Secondary | ICD-10-CM

## 2018-04-14 NOTE — Telephone Encounter (Signed)
Lab order placed. See labs.     Copied from CRM [id number]. Topic: General - Other >> Apr 14, 2018  9:21 AM Gean BirchwoodWilliams-Neal, Sade R wrote: Patient called in to schedule to get her TSH work done. But there is no order placed in chart >> Apr 14, 2018  9:41 AM Claris PongBlackwood, Samantha J wrote: Patient was supposed to have TSH rechecked in October, please advise on orders

## 2018-04-14 NOTE — Telephone Encounter (Signed)
Pt informed of below.  

## 2018-04-15 ENCOUNTER — Encounter: Payer: Self-pay | Admitting: Internal Medicine

## 2018-04-15 ENCOUNTER — Other Ambulatory Visit (INDEPENDENT_AMBULATORY_CARE_PROVIDER_SITE_OTHER): Payer: BC Managed Care – PPO

## 2018-04-15 DIAGNOSIS — E039 Hypothyroidism, unspecified: Secondary | ICD-10-CM | POA: Diagnosis not present

## 2018-04-15 LAB — TSH: TSH: 0.92 u[IU]/mL (ref 0.35–4.50)

## 2018-07-19 ENCOUNTER — Other Ambulatory Visit: Payer: Self-pay | Admitting: Internal Medicine

## 2018-10-21 ENCOUNTER — Other Ambulatory Visit: Payer: Self-pay | Admitting: Internal Medicine

## 2018-12-26 ENCOUNTER — Telehealth: Payer: Self-pay | Admitting: Internal Medicine

## 2018-12-26 ENCOUNTER — Encounter: Payer: Self-pay | Admitting: Internal Medicine

## 2018-12-26 DIAGNOSIS — Z0000 Encounter for general adult medical examination without abnormal findings: Secondary | ICD-10-CM

## 2018-12-26 DIAGNOSIS — E039 Hypothyroidism, unspecified: Secondary | ICD-10-CM

## 2018-12-26 DIAGNOSIS — R7303 Prediabetes: Secondary | ICD-10-CM

## 2018-12-26 NOTE — Telephone Encounter (Signed)
Patient is requesting labs to be entered for CPE on Monday.  She would like to come in Monday morning to have labs done prior to appt.  Please follow up in regard with patient.

## 2018-12-26 NOTE — Telephone Encounter (Signed)
Ordered.  Sent message to pt via mychart

## 2018-12-27 NOTE — Patient Instructions (Addendum)
Your blood work was reviewed  All other Health Maintenance issues reviewed.   All recommended immunizations and age-appropriate screenings are up-to-date or discussed.  Pneumovax pneumonia immunization administered today.   Medications reviewed and updated.  Changes include :   none    Please followup in 1 year    Health Maintenance, Female Adopting a healthy lifestyle and getting preventive care are important in promoting health and wellness. Ask your health care provider about:  The right schedule for you to have regular tests and exams.  Things you can do on your own to prevent diseases and keep yourself healthy. What should I know about diet, weight, and exercise? Eat a healthy diet   Eat a diet that includes plenty of vegetables, fruits, low-fat dairy products, and lean protein.  Do not eat a lot of foods that are high in solid fats, added sugars, or sodium. Maintain a healthy weight Body mass index (BMI) is used to identify weight problems. It estimates body fat based on height and weight. Your health care provider can help determine your BMI and help you achieve or maintain a healthy weight. Get regular exercise Get regular exercise. This is one of the most important things you can do for your health. Most adults should:  Exercise for at least 150 minutes each week. The exercise should increase your heart rate and make you sweat (moderate-intensity exercise).  Do strengthening exercises at least twice a week. This is in addition to the moderate-intensity exercise.  Spend less time sitting. Even light physical activity can be beneficial. Watch cholesterol and blood lipids Have your blood tested for lipids and cholesterol at 66 years of age, then have this test every 5 years. Have your cholesterol levels checked more often if:  Your lipid or cholesterol levels are high.  You are older than 66 years of age.  You are at high risk for heart disease. What should I  know about cancer screening? Depending on your health history and family history, you may need to have cancer screening at various ages. This may include screening for:  Breast cancer.  Cervical cancer.  Colorectal cancer.  Skin cancer.  Lung cancer. What should I know about heart disease, diabetes, and high blood pressure? Blood pressure and heart disease  High blood pressure causes heart disease and increases the risk of stroke. This is more likely to develop in people who have high blood pressure readings, are of African descent, or are overweight.  Have your blood pressure checked: ? Every 3-5 years if you are 1618-66 years of age. ? Every year if you are 56104 years old or older. Diabetes Have regular diabetes screenings. This checks your fasting blood sugar level. Have the screening done:  Once every three years after age 66 if you are at a normal weight and have a low risk for diabetes.  More often and at a younger age if you are overweight or have a high risk for diabetes. What should I know about preventing infection? Hepatitis B If you have a higher risk for hepatitis B, you should be screened for this virus. Talk with your health care provider to find out if you are at risk for hepatitis B infection. Hepatitis C Testing is recommended for:  Everyone born from [birth year][address]45 through 1965.  Anyone with known risk factors for hepatitis C. Sexually transmitted infections (STIs)  Get screened for STIs, including gonorrhea and chlamydia, if: ? You are sexually active and are younger than 66 years of  age. ? You are older than 66 years of age and your health care provider tells you that you are at risk for this type of infection. ? Your sexual activity has changed since you were last screened, and you are at increased risk for chlamydia or gonorrhea. Ask your health care provider if you are at risk.  Ask your health care provider about whether you are at high risk for HIV. Your health  care provider may recommend a prescription medicine to help prevent HIV infection. If you choose to take medicine to prevent HIV, you should first get tested for HIV. You should then be tested every 3 months for as long as you are taking the medicine. Pregnancy  If you are about to stop having your period (premenopausal) and you may become pregnant, seek counseling before you get pregnant.  Take 400 to 800 micrograms (mcg) of folic acid every day if you become pregnant.  Ask for birth control (contraception) if you want to prevent pregnancy. Osteoporosis and menopause Osteoporosis is a disease in which the bones lose minerals and strength with aging. This can result in bone fractures. If you are 33 years old or older, or if you are at risk for osteoporosis and fractures, ask your health care provider if you should:  Be screened for bone loss.  Take a calcium or vitamin D supplement to lower your risk of fractures.  Be given hormone replacement therapy (HRT) to treat symptoms of menopause. Follow these instructions at home: Lifestyle  Do not use any products that contain nicotine or tobacco, such as cigarettes, e-cigarettes, and chewing tobacco. If you need help quitting, ask your health care provider.  Do not use street drugs.  Do not share needles.  Ask your health care provider for help if you need support or information about quitting drugs. Alcohol use  Do not drink alcohol if: ? Your health care provider tells you not to drink. ? You are pregnant, may be pregnant, or are planning to become pregnant.  If you drink alcohol: ? Limit how much you use to 0-1 drink a day. ? Limit intake if you are breastfeeding.  Be aware of how much alcohol is in your drink. In the [place] one drink equals one 12 oz bottle of beer (355 mL), one 5 oz glass of wine (148 mL), or one 1 oz glass of hard liquor (44 mL). General instructions  Schedule regular health, dental, and eye exams.  Stay  current with your vaccines.  Tell your health care provider if: ? You often feel depressed. ? You have ever been abused or do not feel safe at home. Summary  Adopting a healthy lifestyle and getting preventive care are important in promoting health and wellness.  Follow your health care provider's instructions about healthy diet, exercising, and getting tested or screened for diseases.  Follow your health care provider's instructions on monitoring your cholesterol and blood pressure. This information is not intended to replace advice given to you by your health care provider. Make sure you discuss any questions you have with your health care provider. Document Released: 10/30/2010 Document Revised: 04/09/2018 Document Reviewed: 04/09/2018 Elsevier Patient Education  2020 [employer].

## 2018-12-27 NOTE — Progress Notes (Signed)
Subjective:    Patient ID: Jaime Rose, female    DOB: [birth year], 68 y.o.   MRN: [id number]  HPI She is here for a physical exam.   She is doing well overall and has no concerns.    Medications and allergies reviewed with patient and updated if appropriate.  Patient Active Problem List   Diagnosis Date Noted  . Injection site reaction 01/31/2017  . Osteopenia 01/10/2017  . Right carotid bruit 12/21/2016  . Prediabetes 12/21/2016  . Hypothyroidism 03/20/2012  . 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 ALLERGIC RHINITIS 12/21/2009  . Cardiac murmur 12/21/2009    Current Outpatient Medications on File Prior to Visit  Medication Sig Dispense Refill  . clobetasol ointment (TEMOVATE) 0.05 % Apply topically 2 (two) times daily. 30 g 5  . cycloSPORINE (RESTASIS) 0.05 % ophthalmic emulsion 2 drops every morning.    Marland Kitchen ibuprofen (ADVIL,MOTRIN) 800 MG tablet Take 1 tablet (800 mg total) by mouth 3 (three) times daily. 21 tablet 0  . levothyroxine (SYNTHROID) 112 MCG tablet TAKE ONE TABLET BY MOUTH DAILY 90 tablet 0  . loratadine (CLARITIN) 10 MG tablet Take 10 mg by mouth as needed.      No current facility-administered medications on file prior to visit.     Past Medical History:  Diagnosis Date  . ALLERGIC RHINITIS   . CHICKENPOX, HX OF 12/21/2009   Qualifier: Diagnosis of  By: Felicity Coyer MD, Raenette Rover Hyperthyroidism    normal TFTs 11/2009 , dx 10/2011  . Varicose vein    s/p sclerosis tx summer 2013    Past Surgical History:  Procedure Laterality Date  . ENDOVENOUS ABLATION SAPHENOUS VEIN W/ LASER  04-11-2012   endovenous laser ablation right greater saphenous vein and stab phlebectomy  right leg  by Gretta Began MD    Social History   Socioeconomic History  . Marital status: Single    Spouse name: Not on file  . Number of children: Not on file  . Years of education: Not on file  . Highest education level: Not on file   Occupational History  . Not on file  Social Needs  . Financial resource strain: Not on file  . Food insecurity    Worry: Not on file    Inability: Not on file  . Transportation needs    Medical: Not on file    Non-medical: Not on file  Tobacco Use  . Smoking status: Former Smoker    Types: Cigarettes    Quit date: 04/30/1988    Years since quitting: 30.6  . Smokeless tobacco: Never Used  Substance and Sexual Activity  . Alcohol use: Yes    Alcohol/week: 0.0 - 1.0 standard drinks    Comment: 1-2 glasses a week  . Drug use: No  . Sexual activity: Not on file  Lifestyle  . Physical activity    Days per week: Not on file    Minutes per session: Not on file  . Stress: Not on file  Relationships  . Social Musician on phone: Not on file    Gets together: Not on file    Attends religious service: Not on file    Active member of club or organization: Not on file    Attends meetings of clubs or organizations: Not on file    Relationship status: Not on file  Other Topics Concern  . Not on file  Social History  Narrative   Single, lives with 2 dtrs- [place]      Exercising regularly - walking dogs    Family History  Problem Relation Age of Onset  . Heart disease Father   . Autoimmune disease Father   . Hypertension Father   . Other Father        varicose veins, AAA  . Stroke Father   . Hyperlipidemia Mother   . Other Mother        varicose veins, AAA  . Stroke Mother   . Heart attack Mother 14  . Stroke Brother   . Hyperlipidemia Brother   . Hyperlipidemia Brother   . Hyperlipidemia Sister   . Diabetes Maternal Grandfather   . Diabetes Paternal Grandfather   . Breast cancer Maternal Aunt     Review of Systems  Constitutional: Negative for chills, fatigue and fever.  Eyes: Negative for visual disturbance.  Respiratory: Negative for cough, shortness of breath and wheezing.   Cardiovascular: Negative for chest pain, palpitations and leg swelling.   Gastrointestinal: Negative for abdominal pain, blood in stool, constipation, diarrhea and nausea.       No gerd  Genitourinary: Negative for dysuria and hematuria.  Musculoskeletal: Positive for arthralgias (rare knee pain).  Skin: Positive for color change. Negative for rash.  Neurological: Negative for dizziness, light-headedness and headaches.  Psychiatric/Behavioral: Negative for dysphoric mood. The patient is not nervous/anxious.        Objective:   Vitals:   12/29/18 1505  BP: 134/82  Pulse: 86  Resp: 16  Temp: 98.3 F (36.8 C)  SpO2: 97%   Filed Weights   12/29/18 1505  Weight: 173 lb (78.5 kg)   Body mass index is 27.92 kg/m.  BP Readings from Last 3 Encounters:  12/29/18 134/82  12/25/17 134/72  01/31/17 134/72    Wt Readings from Last 3 Encounters:  12/29/18 173 lb (78.5 kg)  12/25/17 171 lb 12.8 oz (77.9 kg)  01/31/17 176 lb (79.8 kg)     Physical Exam Constitutional: She appears well-developed and well-nourished. No distress.  HENT:  Head: Normocephalic and atraumatic.  Right Ear: External ear normal. Normal ear canal and TM Left Ear: External ear normal.  Normal ear canal and TM Mouth/Throat: Oropharynx is clear and moist.  Eyes: Conjunctivae and EOM are normal.  Neck: Neck supple. No tracheal deviation present. No thyromegaly present.  No carotid bruit  Cardiovascular: Normal rate, regular rhythm and normal heart sounds.  1/6 systolic murmur heard.  No edema.  Several small varicose veins on b/l LE Pulmonary/Chest: Effort normal and breath sounds normal. No respiratory distress. She has no wheezes. She has no rales.  Breast: deferred   Abdominal: Soft. She exhibits no distension. There is no tenderness.  Lymphadenopathy: She has no cervical adenopathy.  Skin: Skin is warm and dry. She is not diaphoretic. Normal appearing moles/age spots Psychiatric: She has a normal mood and affect. Her behavior is normal.        Assessment & Plan:    Physical exam: Screening blood work reviewed Immunizations   Flu deferred - will get at school, pneumovax today  Colonoscopy    Never had one - cologuard preferred - will have it sent to her house Mammogram   Up to date  Gyn    due - will schedule  Dexa   Up to date  Eye exams   Up to date - Dr Camillo Flaming Exercise  Walking, doing some strength training Weight  overweight Skin   No concerns   Substance abuse   none  See Problem List for Assessment and Plan of chronic medical problems.    FU in one year

## 2018-12-29 ENCOUNTER — Encounter: Payer: Self-pay | Admitting: Internal Medicine

## 2018-12-29 ENCOUNTER — Ambulatory Visit (INDEPENDENT_AMBULATORY_CARE_PROVIDER_SITE_OTHER): Payer: BC Managed Care – PPO | Admitting: Internal Medicine

## 2018-12-29 ENCOUNTER — Other Ambulatory Visit (INDEPENDENT_AMBULATORY_CARE_PROVIDER_SITE_OTHER): Payer: BC Managed Care – PPO

## 2018-12-29 ENCOUNTER — Other Ambulatory Visit: Payer: Self-pay

## 2018-12-29 VITALS — BP 134/82 | HR 86 | Temp 98.3°F | Resp 16 | Ht 66.0 in | Wt 173.0 lb

## 2018-12-29 DIAGNOSIS — H40002 Preglaucoma, unspecified, left eye: Secondary | ICD-10-CM | POA: Insufficient documentation

## 2018-12-29 DIAGNOSIS — R7303 Prediabetes: Secondary | ICD-10-CM | POA: Diagnosis not present

## 2018-12-29 DIAGNOSIS — Z23 Encounter for immunization: Secondary | ICD-10-CM | POA: Diagnosis not present

## 2018-12-29 DIAGNOSIS — E039 Hypothyroidism, unspecified: Secondary | ICD-10-CM

## 2018-12-29 DIAGNOSIS — Z0000 Encounter for general adult medical examination without abnormal findings: Secondary | ICD-10-CM

## 2018-12-29 DIAGNOSIS — M85859 Other specified disorders of bone density and structure, unspecified thigh: Secondary | ICD-10-CM

## 2018-12-29 DIAGNOSIS — Z0001 Encounter for general adult medical examination with abnormal findings: Secondary | ICD-10-CM | POA: Diagnosis not present

## 2018-12-29 DIAGNOSIS — R011 Cardiac murmur, unspecified: Secondary | ICD-10-CM

## 2018-12-29 LAB — COMPREHENSIVE METABOLIC PANEL
ALT: 13 U/L (ref 0–35)
AST: 13 U/L (ref 0–37)
Albumin: 4 g/dL (ref 3.5–5.2)
Alkaline Phosphatase: 71 U/L (ref 39–117)
BUN: 18 mg/dL (ref 6–23)
CO2: 28 mEq/L (ref 19–32)
Calcium: 9.7 mg/dL (ref 8.4–10.5)
Chloride: 104 mEq/L (ref 96–112)
Creatinine, Ser: 0.81 mg/dL (ref 0.40–1.20)
GFR: 70.73 mL/min (ref >60.00)
Glucose, Bld: 92 mg/dL (ref 70–99)
Potassium: 4.5 mEq/L (ref 3.5–5.1)
Sodium: 139 mEq/L (ref 135–145)
Total Bilirubin: 0.5 mg/dL (ref 0.2–1.2)
Total Protein: 7.2 g/dL (ref 6.0–8.3)

## 2018-12-29 LAB — CBC WITH DIFFERENTIAL/PLATELET
Basophils Absolute: 0 10*3/uL (ref 0.0–0.1)
Basophils Relative: 0.7 % (ref 0.0–3.0)
Eosinophils Absolute: 0.2 10*3/uL (ref 0.0–0.7)
Eosinophils Relative: 3.3 % (ref 0.0–5.0)
HCT: 39 % (ref 36.0–46.0)
Hemoglobin: 13 g/dL (ref 12.0–15.0)
Lymphocytes Relative: 31.7 % (ref 12.0–46.0)
Lymphs Abs: 2.1 10*3/uL (ref 0.7–4.0)
MCHC: 33.2 g/dL (ref 30.0–36.0)
MCV: 88.5 fl (ref 78.0–100.0)
Monocytes Absolute: 0.7 10*3/uL (ref 0.1–1.0)
Monocytes Relative: 9.9 % (ref 3.0–12.0)
Neutro Abs: 3.6 10*3/uL (ref 1.4–7.7)
Neutrophils Relative %: 54.4 % (ref 43.0–77.0)
Platelets: 307 10*3/uL (ref 150.0–400.0)
RBC: 4.41 Mil/uL (ref 3.87–5.11)
RDW: 13.6 % (ref 11.5–15.5)
WBC: 6.7 10*3/uL (ref 4.0–10.5)

## 2018-12-29 LAB — HEMOGLOBIN A1C: Hgb A1c MFr Bld: 5.8 % (ref 4.6–6.5)

## 2018-12-29 LAB — LIPID PANEL
Cholesterol: 213 mg/dL — ABNORMAL HIGH (ref 0–200)
HDL: 82.6 mg/dL (ref >39.00)
LDL Cholesterol: 116 mg/dL — ABNORMAL HIGH (ref 0–99)
NonHDL: 130.06
Total CHOL/HDL Ratio: 3
Triglycerides: 69 mg/dL (ref 0.0–149.0)
VLDL: 13.8 mg/dL (ref 0.0–40.0)

## 2018-12-29 LAB — TSH: TSH: 2.7 u[IU]/mL (ref 0.35–4.50)

## 2018-12-29 NOTE — Assessment & Plan Note (Signed)
dexa up to date Walking, strength training

## 2018-12-29 NOTE — Addendum Note (Signed)
Addended by: Delice Bison E on: 12/29/2018 04:48 PM   Modules accepted: Orders

## 2018-12-29 NOTE — Assessment & Plan Note (Signed)
Lab Results  Component Value Date   TSH 2.70 12/29/2018   Continue current dose of medication

## 2018-12-29 NOTE — Assessment & Plan Note (Signed)
Mild, stable  1/6 ESM Will monitor

## 2018-12-29 NOTE — Assessment & Plan Note (Addendum)
Lab Results  Component Value Date   HGBA1C 5.8 12/29/2018    Low sugar / carb diet Stressed regular exercise

## 2019-01-08 ENCOUNTER — Telehealth: Payer: Self-pay

## 2019-01-08 NOTE — Telephone Encounter (Signed)
Done

## 2019-01-08 NOTE — Telephone Encounter (Signed)
-----   Message from Binnie Rail, MD sent at 12/29/2018  3:20 PM EDT ----- Wants cologuard

## 2019-02-02 ENCOUNTER — Other Ambulatory Visit: Payer: Self-pay | Admitting: Internal Medicine

## 2019-02-16 ENCOUNTER — Telehealth: Payer: Self-pay

## 2019-02-16 NOTE — Telephone Encounter (Signed)
Copied from [place] [id number]. Topic: General - Other >> Feb 16, 2019  1:53 PM Alanda Slim E wrote: Reason for CRM: Pt need to submit a letter to [facility] tomorrow / Pt asked for a letter giving her permission to continue to work remotely due to being high risk and her age. Please advise asap

## 2019-02-16 NOTE — Telephone Encounter (Signed)
Note written. Pt aware and will try to access it through my chart.

## 2019-02-16 NOTE — Telephone Encounter (Signed)
Ok to write? 

## 2019-02-18 ENCOUNTER — Encounter: Payer: Self-pay | Admitting: Internal Medicine

## 2019-03-05 ENCOUNTER — Other Ambulatory Visit: Payer: Self-pay

## 2019-03-05 DIAGNOSIS — Z20822 Contact with and (suspected) exposure to covid-19: Secondary | ICD-10-CM

## 2019-03-06 LAB — NOVEL CORONAVIRUS, NAA: SARS-CoV-2, NAA: NOT DETECTED

## 2019-05-11 ENCOUNTER — Ambulatory Visit: Payer: BC Managed Care – PPO | Attending: Internal Medicine

## 2019-05-11 ENCOUNTER — Other Ambulatory Visit: Payer: Self-pay | Admitting: Internal Medicine

## 2019-05-11 DIAGNOSIS — Z20822 Contact with and (suspected) exposure to covid-19: Secondary | ICD-10-CM

## 2019-05-12 LAB — NOVEL CORONAVIRUS, NAA: SARS-CoV-2, NAA: NOT DETECTED

## 2019-08-14 ENCOUNTER — Other Ambulatory Visit: Payer: Self-pay | Admitting: Internal Medicine

## 2019-11-14 ENCOUNTER — Other Ambulatory Visit: Payer: Self-pay | Admitting: Internal Medicine

## 2020-02-15 ENCOUNTER — Other Ambulatory Visit: Payer: Self-pay | Admitting: Internal Medicine

## 2020-03-12 ENCOUNTER — Ambulatory Visit: Payer: BC Managed Care – PPO

## 2020-04-07 ENCOUNTER — Encounter: Payer: Self-pay | Admitting: Internal Medicine

## 2020-04-07 DIAGNOSIS — Z0000 Encounter for general adult medical examination without abnormal findings: Secondary | ICD-10-CM

## 2020-04-07 DIAGNOSIS — R7303 Prediabetes: Secondary | ICD-10-CM

## 2020-04-07 DIAGNOSIS — E039 Hypothyroidism, unspecified: Secondary | ICD-10-CM

## 2020-04-08 NOTE — Telephone Encounter (Signed)
    Patient requesting annual lab orders

## 2020-04-11 NOTE — Patient Instructions (Addendum)
Blood work was ordered.     Medications changes include :   none    A bone density was ordered.    Please followup in 1 year    Health Maintenance, Female Adopting a healthy lifestyle and getting preventive care are important in promoting health and wellness. Ask your health care provider about:  The right schedule for you to have regular tests and exams.  Things you can do on your own to prevent diseases and keep yourself healthy. What should I know about diet, weight, and exercise? Eat a healthy diet   Eat a diet that includes plenty of vegetables, fruits, low-fat dairy products, and lean protein.  Do not eat a lot of foods that are high in solid fats, added sugars, or sodium. Maintain a healthy weight Body mass index (BMI) is used to identify weight problems. It estimates body fat based on height and weight. Your health care provider can help determine your BMI and help you achieve or maintain a healthy weight. Get regular exercise Get regular exercise. This is one of the most important things you can do for your health. Most adults should:  Exercise for at least 150 minutes each week. The exercise should increase your heart rate and make you sweat (moderate-intensity exercise).  Do strengthening exercises at least twice a week. This is in addition to the moderate-intensity exercise.  Spend less time sitting. Even light physical activity can be beneficial. Watch cholesterol and blood lipids Have your blood tested for lipids and cholesterol at 67 years of age, then have this test every 5 years. Have your cholesterol levels checked more often if:  Your lipid or cholesterol levels are high.  You are older than 67 years of age.  You are at high risk for heart disease. What should I know about cancer screening? Depending on your health history and family history, you may need to have cancer screening at various ages. This may include screening for:  Breast  cancer.  Cervical cancer.  Colorectal cancer.  Skin cancer.  Lung cancer. What should I know about heart disease, diabetes, and high blood pressure? Blood pressure and heart disease  High blood pressure causes heart disease and increases the risk of stroke. This is more likely to develop in people who have high blood pressure readings, are of African descent, or are overweight.  Have your blood pressure checked: ? Every 3-5 years if you are 9-54 years of age. ? Every year if you are 77 years old or older. Diabetes Have regular diabetes screenings. This checks your fasting blood sugar level. Have the screening done:  Once every three years after age 10 if you are at a normal weight and have a low risk for diabetes.  More often and at a younger age if you are overweight or have a high risk for diabetes. What should I know about preventing infection? Hepatitis B If you have a higher risk for hepatitis B, you should be screened for this virus. Talk with your health care provider to find out if you are at risk for hepatitis B infection. Hepatitis C Testing is recommended for:  Everyone born from [birth year] through 1963.  Anyone with known risk factors for hepatitis C. Sexually transmitted infections (STIs)  Get screened for STIs, including gonorrhea and chlamydia, if: ? You are sexually active and are younger than 67 years of age. ? You are older than 67 years of age and your health care provider tells you that you are  at risk for this type of infection. ? Your sexual activity has changed since you were last screened, and you are at increased risk for chlamydia or gonorrhea. Ask your health care provider if you are at risk.  Ask your health care provider about whether you are at high risk for HIV. Your health care provider may recommend a prescription medicine to help prevent HIV infection. If you choose to take medicine to prevent HIV, you should first get tested for HIV. You should  then be tested every 3 months for as long as you are taking the medicine. Pregnancy  If you are about to stop having your period (premenopausal) and you may become pregnant, seek counseling before you get pregnant.  Take 400 to 800 micrograms (mcg) of folic acid every day if you become pregnant.  Ask for birth control (contraception) if you want to prevent pregnancy. Osteoporosis and menopause Osteoporosis is a disease in which the bones lose minerals and strength with aging. This can result in bone fractures. If you are 51 years old or older, or if you are at risk for osteoporosis and fractures, ask your health care provider if you should:  Be screened for bone loss.  Take a calcium or vitamin D supplement to lower your risk of fractures.  Be given hormone replacement therapy (HRT) to treat symptoms of menopause. Follow these instructions at home: Lifestyle  Do not use any products that contain nicotine or tobacco, such as cigarettes, e-cigarettes, and chewing tobacco. If you need help quitting, ask your health care provider.  Do not use street drugs.  Do not share needles.  Ask your health care provider for help if you need support or information about quitting drugs. Alcohol use  Do not drink alcohol if: ? Your health care provider tells you not to drink. ? You are pregnant, may be pregnant, or are planning to become pregnant.  If you drink alcohol: ? Limit how much you use to 0-1 drink a day. ? Limit intake if you are breastfeeding.  Be aware of how much alcohol is in your drink. In the [place] one drink equals one 12 oz bottle of beer (355 mL), one 5 oz glass of wine (148 mL), or one 1 oz glass of hard liquor (44 mL). General instructions  Schedule regular health, dental, and eye exams.  Stay current with your vaccines.  Tell your health care provider if: ? You often feel depressed. ? You have ever been abused or do not feel safe at home. Summary  Adopting a  healthy lifestyle and getting preventive care are important in promoting health and wellness.  Follow your health care provider's instructions about healthy diet, exercising, and getting tested or screened for diseases.  Follow your health care provider's instructions on monitoring your cholesterol and blood pressure. This information is not intended to replace advice given to you by your health care provider. Make sure you discuss any questions you have with your health care provider. Document Revised: 04/09/2018 Document Reviewed: 04/09/2018 Elsevier Patient Education  2020 [employer].

## 2020-04-11 NOTE — Progress Notes (Signed)
Subjective:    Patient ID: Jaime Rose, female    DOB: April 18, 1951, 69 y.o.   MRN: [id number]   This visit occurred during the SARS-CoV-2 public health emergency.  Safety protocols were in place, including screening questions prior to the visit, additional usage of staff PPE, and extensive cleaning of exam room while observing appropriate contact time as indicated for disinfecting solutions.    HPI She is here for a physical exam.   She has frequency of urination.  She does worry about the availability of a bathroom.  Sometimes she has a lot of urine and sometimes a little.  She goes to the bathroom 10 times during the day.  Most nights she sleep through the night.  She denies stress incontinence, maybe minimal urge incontinence.  She knows it is more mental.  She sometimes can put up going to the bathroom and avoid a trip.  Big toes are stiff at night.  Dry, cracked hands.  She is concerned this is related to her thyroid.  R upper eyelid lower than L.    Medications and allergies reviewed with patient and updated if appropriate.  Patient Active Problem List   Diagnosis Date Noted  . 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 12/29/2018  . Osteopenia 01/10/2017  . Right carotid bruit 12/21/2016  . Prediabetes 12/21/2016  . Hypothyroidism 03/20/2012  . 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 ALLERGIC RHINITIS 12/21/2009  . Cardiac murmur 12/21/2009    Current Outpatient Medications on File Prior to Visit  Medication Sig Dispense Refill  . levothyroxine (SYNTHROID) 112 MCG tablet TAKE ONE TABLET BY MOUTH DAILY 60 tablet 0  . loratadine (CLARITIN) 10 MG tablet Take 10 mg by mouth as needed.      No current facility-administered medications on file prior to visit.    Past Medical History:  Diagnosis Date  . ALLERGIC RHINITIS   . CHICKENPOX, HX OF 12/21/2009   Qualifier: Diagnosis of  By: Felicity Coyer MD, Raenette Rover Hyperthyroidism    normal  TFTs 11/2009 , dx 10/2011  . Varicose vein    s/p sclerosis tx summer 2013    Past Surgical History:  Procedure Laterality Date  . ENDOVENOUS ABLATION SAPHENOUS VEIN W/ LASER  04-11-2012   endovenous laser ablation right greater saphenous vein and stab phlebectomy  right leg  by Gretta Began MD    Social History   Socioeconomic History  . Marital status: Single    Spouse name: Not on file  . Number of children: Not on file  . Years of education: Not on file  . Highest education level: Not on file  Occupational History  . Not on file  Tobacco Use  . Smoking status: Former Smoker    Types: Cigarettes    Quit date: 04/30/1988    Years since quitting: 31.9  . Smokeless tobacco: Never Used  Substance and Sexual Activity  . Alcohol use: Yes    Alcohol/week: 0.0 - 1.0 standard drinks    Comment: 1-2 glasses a week  . Drug use: No  . Sexual activity: Not on file  Other Topics Concern  . Not on file  Social History Narrative   Single, lives with 2 dtrs- Victory Dakin & Tresa Endo      Exercising regularly - walking dogs   Social Determinants of Health   Financial Resource Strain: Not on file  Food Insecurity: Not on file  Transportation Needs: Not on file  Physical Activity:  Not on file  Stress: Not on file  Social Connections: Not on file    Family History  Problem Relation Age of Onset  . Heart disease Father   . Autoimmune disease Father   . Hypertension Father   . Other Father        varicose veins, AAA  . Stroke Father   . Hyperlipidemia Mother   . Other Mother        varicose veins, AAA  . Stroke Mother   . Heart attack Mother 58  . Stroke Brother   . Hyperlipidemia Brother   . Hyperlipidemia Brother   . Hyperlipidemia Sister   . Diabetes Maternal Grandfather   . Diabetes Paternal Grandfather   . Breast cancer Maternal Aunt     Review of Systems  Constitutional: Negative for chills and fever.  Eyes: Negative for visual disturbance.  Respiratory: Negative for  cough, shortness of breath and wheezing.   Cardiovascular: Negative for chest pain, palpitations and leg swelling.  Gastrointestinal: Negative for abdominal pain, blood in stool, constipation, diarrhea and nausea.       No gerd  Genitourinary: Positive for frequency. Negative for dysuria and hematuria.  Musculoskeletal: Negative for arthralgias and back pain.  Skin: Negative for color change and rash.  Neurological: Negative for light-headedness and headaches.  Psychiatric/Behavioral: Negative for dysphoric mood. The patient is not nervous/anxious.        Objective:   Vitals:   04/12/20 1553  BP: 128/68  Pulse: 62  Temp: 98 F (36.7 C)  SpO2: 98%   Filed Weights   04/12/20 1553  Weight: 168 lb (76.2 kg)   Body mass index is 27.12 kg/m.  BP Readings from Last 3 Encounters:  04/12/20 128/68  12/29/18 134/82  12/25/17 134/72    Wt Readings from Last 3 Encounters:  04/12/20 168 lb (76.2 kg)  12/29/18 173 lb (78.5 kg)  12/25/17 171 lb 12.8 oz (77.9 kg)     Physical Exam Constitutional: She appears well-developed and well-nourished. No distress.  HENT:  Head: Normocephalic and atraumatic.  Right Ear: External ear normal. Normal ear canal and TM Left Ear: External ear normal.  Normal ear canal and TM Mouth/Throat: Oropharynx is clear and moist.  Eyes: Conjunctivae and EOM are normal. Right upper eyelid ptosis-mild           Neck: Neck supple. No tracheal deviation present. No thyromegaly present.  No carotid bruit  Cardiovascular: Normal rate, regular rhythm and normal heart sounds.   1/6 systolic murmur heard.  No edema. Pulmonary/Chest: Effort normal and breath sounds normal. No respiratory distress. She has no wheezes. She has no rales.  Breast: deferred   Abdominal: Soft. She exhibits no distension. There is no tenderness.  Lymphadenopathy: She has no cervical adenopathy.  Skin: Skin is warm and dry. She is not diaphoretic.  Psychiatric: She has a normal mood  and affect. Her behavior is normal.        Assessment & Plan:   Physical exam: Screening blood work    ordered Immunizations  had Covid, had flu Cologuard - will have sent to her  Mammogram  Will schedule Gyn  Due - will schedule Dexa  ordered Eye exams  Up to date  Exercise  Walks two dogs 3 times a day Weight  Ok for age Substance abuse  none      See Problem List for Assessment and Plan of chronic medical problems.

## 2020-04-12 ENCOUNTER — Ambulatory Visit (INDEPENDENT_AMBULATORY_CARE_PROVIDER_SITE_OTHER): Payer: BC Managed Care – PPO | Admitting: Internal Medicine

## 2020-04-12 ENCOUNTER — Encounter: Payer: Self-pay | Admitting: Internal Medicine

## 2020-04-12 ENCOUNTER — Other Ambulatory Visit: Payer: Self-pay

## 2020-04-12 VITALS — BP 128/68 | HR 62 | Temp 98.0°F | Ht 66.0 in | Wt 168.0 lb

## 2020-04-12 DIAGNOSIS — Z0000 Encounter for general adult medical examination without abnormal findings: Secondary | ICD-10-CM | POA: Diagnosis not present

## 2020-04-12 DIAGNOSIS — M85859 Other specified disorders of bone density and structure, unspecified thigh: Secondary | ICD-10-CM

## 2020-04-12 DIAGNOSIS — E039 Hypothyroidism, unspecified: Secondary | ICD-10-CM

## 2020-04-12 DIAGNOSIS — R7303 Prediabetes: Secondary | ICD-10-CM | POA: Diagnosis not present

## 2020-04-12 DIAGNOSIS — H02401 Unspecified ptosis of right eyelid: Secondary | ICD-10-CM

## 2020-04-12 NOTE — Assessment & Plan Note (Signed)
New problem She has noticed this and thinks it has gotten worse There is no negative effect on vision Advise follow-up with her ophthalmologist first-May need to see neuro-ophthalmology

## 2020-04-12 NOTE — Assessment & Plan Note (Signed)
Chronic  Clinically euthyroid-does have very dry skin, but this may be related to the time of year more than her thyroid Currently taking levothyroxine 112 mcg daily Check tsh  Titrate med dose if needed

## 2020-04-12 NOTE — Assessment & Plan Note (Signed)
Chronic DEXA due-ordered Stressed regular exercise  

## 2020-04-12 NOTE — Assessment & Plan Note (Signed)
Chronic Check a1c Low sugar / carb diet Stressed regular exercise  

## 2020-04-15 ENCOUNTER — Other Ambulatory Visit (INDEPENDENT_AMBULATORY_CARE_PROVIDER_SITE_OTHER): Payer: BC Managed Care – PPO

## 2020-04-15 DIAGNOSIS — E039 Hypothyroidism, unspecified: Secondary | ICD-10-CM

## 2020-04-15 DIAGNOSIS — Z0000 Encounter for general adult medical examination without abnormal findings: Secondary | ICD-10-CM

## 2020-04-15 DIAGNOSIS — R7303 Prediabetes: Secondary | ICD-10-CM

## 2020-04-15 LAB — COMPREHENSIVE METABOLIC PANEL
ALT: 16 U/L (ref 0–35)
AST: 12 U/L (ref 0–37)
Albumin: 3.9 g/dL (ref 3.5–5.2)
Alkaline Phosphatase: 80 U/L (ref 39–117)
BUN: 17 mg/dL (ref 6–23)
CO2: 28 mEq/L (ref 19–32)
Calcium: 9.5 mg/dL (ref 8.4–10.5)
Chloride: 106 mEq/L (ref 96–112)
Creatinine, Ser: 0.79 mg/dL (ref 0.40–1.20)
GFR: 77.44 mL/min (ref >60.00)
Glucose, Bld: 98 mg/dL (ref 70–99)
Potassium: 4.2 mEq/L (ref 3.5–5.1)
Sodium: 139 mEq/L (ref 135–145)
Total Bilirubin: 0.6 mg/dL (ref 0.2–1.2)
Total Protein: 7.3 g/dL (ref 6.0–8.3)

## 2020-04-15 LAB — LIPID PANEL
Cholesterol: 207 mg/dL — ABNORMAL HIGH (ref 0–200)
HDL: 87.7 mg/dL (ref >39.00)
LDL Cholesterol: 106 mg/dL — ABNORMAL HIGH (ref 0–99)
NonHDL: 119.29
Total CHOL/HDL Ratio: 2
Triglycerides: 68 mg/dL (ref 0.0–149.0)
VLDL: 13.6 mg/dL (ref 0.0–40.0)

## 2020-04-15 LAB — CBC WITH DIFFERENTIAL/PLATELET
Basophils Absolute: 0.1 10*3/uL (ref 0.0–0.1)
Basophils Relative: 1.2 % (ref 0.0–3.0)
Eosinophils Absolute: 0.2 10*3/uL (ref 0.0–0.7)
Eosinophils Relative: 4.4 % (ref 0.0–5.0)
HCT: 36.7 % (ref 36.0–46.0)
Hemoglobin: 12.5 g/dL (ref 12.0–15.0)
Lymphocytes Relative: 30.2 % (ref 12.0–46.0)
Lymphs Abs: 1.6 10*3/uL (ref 0.7–4.0)
MCHC: 34 g/dL (ref 30.0–36.0)
MCV: 86.3 fl (ref 78.0–100.0)
Monocytes Absolute: 0.6 10*3/uL (ref 0.1–1.0)
Monocytes Relative: 10.5 % (ref 3.0–12.0)
Neutro Abs: 2.9 10*3/uL (ref 1.4–7.7)
Neutrophils Relative %: 53.7 % (ref 43.0–77.0)
Platelets: 327 10*3/uL (ref 150.0–400.0)
RBC: 4.25 Mil/uL (ref 3.87–5.11)
RDW: 13.7 % (ref 11.5–15.5)
WBC: 5.4 10*3/uL (ref 4.0–10.5)

## 2020-04-15 LAB — HEMOGLOBIN A1C: Hgb A1c MFr Bld: 5.7 % (ref 4.6–6.5)

## 2020-04-15 LAB — TSH: TSH: 1.89 u[IU]/mL (ref 0.35–4.50)

## 2020-04-18 ENCOUNTER — Other Ambulatory Visit: Payer: Self-pay | Admitting: Internal Medicine

## 2020-04-18 ENCOUNTER — Encounter: Payer: Self-pay | Admitting: Internal Medicine

## 2020-04-18 MED ORDER — LEVOTHYROXINE SODIUM 112 MCG PO TABS: 112.0000 ug | ORAL_TABLET | Freq: Every day | ORAL | 3 refills | Status: DC

## 2020-05-06 ENCOUNTER — Other Ambulatory Visit: Payer: Self-pay

## 2020-05-06 DIAGNOSIS — Z1211 Encounter for screening for malignant neoplasm of colon: Secondary | ICD-10-CM

## 2020-05-10 ENCOUNTER — Encounter: Payer: Self-pay | Admitting: Internal Medicine

## 2020-05-27 ENCOUNTER — Other Ambulatory Visit: Payer: Self-pay | Admitting: Internal Medicine

## 2020-05-27 DIAGNOSIS — Z1231 Encounter for screening mammogram for malignant neoplasm of breast: Secondary | ICD-10-CM

## 2020-06-27 LAB — COLOGUARD: Cologuard: NEGATIVE

## 2020-07-07 LAB — EXTERNAL GENERIC LAB PROCEDURE: COLOGUARD: NEGATIVE

## 2020-07-13 ENCOUNTER — Ambulatory Visit: Payer: BC Managed Care – PPO

## 2020-11-25 ENCOUNTER — Other Ambulatory Visit: Payer: Self-pay | Admitting: Internal Medicine

## 2020-11-25 DIAGNOSIS — M85859 Other specified disorders of bone density and structure, unspecified thigh: Secondary | ICD-10-CM

## 2021-01-18 ENCOUNTER — Other Ambulatory Visit: Payer: Self-pay

## 2021-01-18 ENCOUNTER — Ambulatory Visit
Admission: RE | Admit: 2021-01-18 | Discharge: 2021-01-18 | Disposition: A | Discharge status: Home / Self Care | Payer: BC Managed Care – PPO | Source: Ambulatory Visit | Attending: Internal Medicine | Admitting: Internal Medicine

## 2021-01-18 DIAGNOSIS — Z1231 Encounter for screening mammogram for malignant neoplasm of breast: Secondary | ICD-10-CM

## 2021-01-25 ENCOUNTER — Other Ambulatory Visit: Payer: Self-pay | Admitting: Internal Medicine

## 2021-01-25 DIAGNOSIS — R928 Other abnormal and inconclusive findings on diagnostic imaging of breast: Secondary | ICD-10-CM

## 2021-01-28 ENCOUNTER — Ambulatory Visit
Admission: RE | Admit: 2021-01-28 | Discharge: 2021-01-28 | Disposition: A | Discharge status: Home / Self Care | Payer: BC Managed Care – PPO | Source: Ambulatory Visit | Attending: Internal Medicine | Admitting: Internal Medicine

## 2021-01-28 ENCOUNTER — Other Ambulatory Visit: Payer: Self-pay

## 2021-01-28 ENCOUNTER — Ambulatory Visit: Payer: BC Managed Care – PPO

## 2021-01-28 DIAGNOSIS — R928 Other abnormal and inconclusive findings on diagnostic imaging of breast: Secondary | ICD-10-CM

## 2021-02-14 ENCOUNTER — Other Ambulatory Visit: Payer: BC Managed Care – PPO

## 2021-02-24 ENCOUNTER — Other Ambulatory Visit: Payer: BC Managed Care – PPO

## 2021-04-12 ENCOUNTER — Encounter: Payer: Self-pay | Admitting: Internal Medicine

## 2021-04-12 NOTE — Patient Instructions (Addendum)
Blood work was ordered.     Medications changes include :   none   Please followup in 1 year   [facility]  Main: [phone]   [facility] [address] [place], [place] [address] [phone]     Health Maintenance, Female Adopting a healthy lifestyle and getting preventive care are important in promoting health and wellness. Ask your health care provider about: The right schedule for you to have regular tests and exams. Things you can do on your own to prevent diseases and keep yourself healthy. What should I know about diet, weight, and exercise? Eat a healthy diet  Eat a diet that includes plenty of vegetables, fruits, low-fat dairy products, and lean protein. Do not eat a lot of foods that are high in solid fats, added sugars, or sodium. Maintain a healthy weight Body mass index (BMI) is used to identify weight problems. It estimates body fat based on height and weight. Your health care provider can help determine your BMI and help you achieve or maintain a healthy weight. Get regular exercise Get regular exercise. This is one of the most important things you can do for your health. Most adults should: Exercise for at least 150 minutes each week. The exercise should increase your heart rate and make you sweat (moderate-intensity exercise). Do strengthening exercises at least twice a week. This is in addition to the moderate-intensity exercise. Spend less time sitting. Even light physical activity can be beneficial. Watch cholesterol and blood lipids Have your blood tested for lipids and cholesterol at 68 years of age, then have this test every 5 years. Have your cholesterol levels checked more often if: Your lipid or cholesterol levels are high. You are older than 68 years of age. You are at high risk for heart disease. What should I know about cancer screening? Depending on your health history and family history, you  may need to have cancer screening at various ages. This may include screening for: Breast cancer. Cervical cancer. Colorectal cancer. Skin cancer. Lung cancer. What should I know about heart disease, diabetes, and high blood pressure? Blood pressure and heart disease High blood pressure causes heart disease and increases the risk of stroke. This is more likely to develop in people who have high blood pressure readings or are overweight. Have your blood pressure checked: Every 3-5 years if you are 38-39 years of age. Every year if you are 33 years old or older. Diabetes Have regular diabetes screenings. This checks your fasting blood sugar level. Have the screening done: Once every three years after age 71 if you are at a normal weight and have a low risk for diabetes. More often and at a younger age if you are overweight or have a high risk for diabetes. What should I know about preventing infection? Hepatitis B If you have a higher risk for hepatitis B, you should be screened for this virus. Talk with your health care provider to find out if you are at risk for hepatitis B infection. Hepatitis C Testing is recommended for: Everyone born from [birth year] through 1965. Anyone with known risk factors for hepatitis C. Sexually transmitted infections (STIs) Get screened for STIs, including gonorrhea and chlamydia, if: You are sexually active and are younger than 68 years of age. You are older than 68 years of age and your health care provider tells you that you are at risk for this type of infection. Your sexual activity has changed since you  were last screened, and you are at increased risk for chlamydia or gonorrhea. Ask your health care provider if you are at risk. Ask your health care provider about whether you are at high risk for HIV. Your health care provider may recommend a prescription medicine to help prevent HIV infection. If you choose to take medicine to prevent HIV, you should first  get tested for HIV. You should then be tested every 3 months for as long as you are taking the medicine. Pregnancy If you are about to stop having your period (premenopausal) and you may become pregnant, seek counseling before you get pregnant. Take 400 to 800 micrograms (mcg) of folic acid every day if you become pregnant. Ask for birth control (contraception) if you want to prevent pregnancy. Osteoporosis and menopause Osteoporosis is a disease in which the bones lose minerals and strength with aging. This can result in bone fractures. If you are 60 years old or older, or if you are at risk for osteoporosis and fractures, ask your health care provider if you should: Be screened for bone loss. Take a calcium or vitamin D supplement to lower your risk of fractures. Be given hormone replacement therapy (HRT) to treat symptoms of menopause. Follow these instructions at home: Alcohol use Do not drink alcohol if: Your health care provider tells you not to drink. You are pregnant, may be pregnant, or are planning to become pregnant. If you drink alcohol: Limit how much you have to: 0-1 drink a day. Know how much alcohol is in your drink. In the [place] one drink equals one 12 oz bottle of beer (355 mL), one 5 oz glass of wine (148 mL), or one 1 oz glass of hard liquor (44 mL). Lifestyle Do not use any products that contain nicotine or tobacco. These products include cigarettes, chewing tobacco, and vaping devices, such as e-cigarettes. If you need help quitting, ask your health care provider. Do not use street drugs. Do not share needles. Ask your health care provider for help if you need support or information about quitting drugs. General instructions Schedule regular health, dental, and eye exams. Stay current with your vaccines. Tell your health care provider if: You often feel depressed. You have ever been abused or do not feel safe at home. Summary Adopting a healthy lifestyle and  getting preventive care are important in promoting health and wellness. Follow your health care provider's instructions about healthy diet, exercising, and getting tested or screened for diseases. Follow your health care provider's instructions on monitoring your cholesterol and blood pressure. This information is not intended to replace advice given to you by your health care provider. Make sure you discuss any questions you have with your health care provider. Document Revised: 09/05/2020 Document Reviewed: 09/05/2020 Elsevier Patient Education  2022 [employer].

## 2021-04-12 NOTE — Progress Notes (Signed)
Subjective:    Patient ID: Jaime Rose, female    DOB: 10-30-1950, 70 y.o.   MRN: [id number]   This visit occurred during the SARS-CoV-2 public health emergency.  Safety protocols were in place, including screening questions prior to the visit, additional usage of staff PPE, and extensive cleaning of exam room while observing appropriate contact time as indicated for disinfecting solutions.    HPI She is here for a physical exam.   She is doing overall well.  She has a little less energy.   She has some left knee pain.  She has a former lateral tear in the meniscus years ago.  She fell and tweaked the knee two weeks ago.  It is better.  She plans on giving it a little more time.      Her feet hurt on top of the foot.    Medications and allergies reviewed with patient and updated if appropriate.  Patient Active Problem List   Diagnosis Date Noted   Ptosis of right eyelid 04/12/2020   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 12/29/2018   Osteopenia 01/10/2017   Right carotid bruit 12/21/2016   Prediabetes 12/21/2016   Hypothyroidism 03/20/2012   Varicose veins of lower extremities with other complications [id number]   ALLERGIC RHINITIS 12/21/2009   Cardiac murmur 12/21/2009    Current Outpatient Medications on File Prior to Visit  Medication Sig Dispense Refill   levothyroxine (SYNTHROID) 112 MCG tablet Take 1 tablet (112 mcg total) by mouth daily. 90 tablet 3   loratadine (CLARITIN) 10 MG tablet Take 10 mg by mouth as needed.      No current facility-administered medications on file prior to visit.    Past Medical History:  Diagnosis Date   ALLERGIC RHINITIS    CHICKENPOX, HX OF 12/21/2009   Qualifier: Diagnosis of  By: Asa Lente MD, Valerie A    Hyperthyroidism    normal TFTs 11/2009 , dx 10/2011   Varicose vein    s/p sclerosis tx summer 2013    Past Surgical History:  Procedure Laterality Date   ENDOVENOUS ABLATION SAPHENOUS VEIN W/ LASER  04-11-2012   endovenous  laser ablation right greater saphenous vein and stab phlebectomy  right leg  by Curt Jews MD    Social History   Socioeconomic History   Marital status: Single    Spouse name: Not on file   Number of children: Not on file   Years of education: Not on file   Highest education level: Not on file  Occupational History   Not on file  Tobacco Use   Smoking status: Former    Types: Cigarettes    Quit date: 04/30/1988    Years since quitting: 32.9   Smokeless tobacco: Never  Substance and Sexual Activity   Alcohol use: Yes    Alcohol/week: 0.0 - 1.0 standard drinks    Comment: 1-2 glasses a week   Drug use: No   Sexual activity: Not on file  Other Topics Concern   Not on file  Social History Narrative   Single, lives with 2 dtrs- Ovid Curd & Claiborne Billings      Exercising regularly - walking dogs   Social Determinants of Health   Financial Resource Strain: Not on file  Food Insecurity: Not on file  Transportation Needs: Not on file  Physical Activity: Not on file  Stress: Not on file  Social Connections: Not on file    Family History  Problem Relation Age of Onset  Heart disease Father    Autoimmune disease Father    Hypertension Father    Other Father        varicose veins, AAA   Stroke Father    Hyperlipidemia Mother    Other Mother        varicose veins, AAA   Stroke Mother    Heart attack Mother 10   Stroke Brother    Hyperlipidemia Brother    Hyperlipidemia Brother    Hyperlipidemia Sister    Diabetes Maternal Grandfather    Diabetes Paternal Grandfather    Breast cancer Maternal Aunt     Review of Systems  Constitutional:  Negative for chills and fever.  Eyes:  Negative for visual disturbance.  Respiratory:  Negative for cough, shortness of breath and wheezing.   Cardiovascular:  Negative for chest pain, palpitations and leg swelling.  Gastrointestinal:  Negative for abdominal pain, blood in stool, constipation, diarrhea and nausea.       No gerd   Genitourinary:  Negative for dysuria and hematuria.  Musculoskeletal:  Positive for arthralgias (left knee, feet). Negative for back pain.  Skin:  Negative for color change and rash.  Neurological:  Negative for dizziness, light-headedness, numbness and headaches.  Psychiatric/Behavioral:  Negative for dysphoric mood. The patient is not nervous/anxious.       Objective:   Vitals:   04/13/21 0801  BP: 130/76  Pulse: 68  Temp: 98.1 F (36.7 C)  SpO2: 98%   Filed Weights   04/13/21 0801  Weight: 171 lb 12.8 oz (77.9 kg)   Body mass index is 27.73 kg/m.  BP Readings from Last 3 Encounters:  04/13/21 130/76  04/12/20 128/68  12/29/18 134/82    Wt Readings from Last 3 Encounters:  04/13/21 171 lb 12.8 oz (77.9 kg)  04/12/20 168 lb (76.2 kg)  12/29/18 173 lb (78.5 kg)    Depression screen [facility] 2/9 04/13/2021 04/12/2020 12/29/2018 12/21/2016  Decreased Interest 0 0 0 0  Down, Depressed, Hopeless 0 0 0 0  PHQ - 2 Score 0 0 0 0  Altered sleeping 0 - - -  Tired, decreased energy 0 - - -  Change in appetite 0 - - -  Feeling bad or failure about yourself  0 - - -  Trouble concentrating 0 - - -  Moving slowly or fidgety/restless 0 - - -  Suicidal thoughts 0 - - -  PHQ-9 Score 0 - - -     GAD 7 : Generalized Anxiety Score 04/13/2021  Nervous, Anxious, on Edge 0  Control/stop worrying 0  Worry too much - different things 1  Trouble relaxing 0  Restless 0  Easily annoyed or irritable 0  Afraid - awful might happen 0  Total GAD 7 Score 1  Anxiety Difficulty Not difficult at all       Physical Exam Constitutional: She appears well-developed and well-nourished. No distress.  HENT:  Head: Normocephalic and atraumatic.  Right Ear: External ear normal. Normal ear canal and TM Left Ear: External ear normal.  Normal ear canal and TM Mouth/Throat: Oropharynx is clear and moist.  Eyes: Conjunctivae and EOM are normal.  Neck: Neck supple. No tracheal deviation present. No  thyromegaly present.  No carotid bruit  Cardiovascular: Normal rate, regular rhythm and normal heart sounds.   1/6 sys murmur heard.  No edema. Pulmonary/Chest: Effort normal and breath sounds normal. No respiratory distress. She has no wheezes. She has no rales.  Breast: deferred   Abdominal: Soft.  She exhibits no distension. There is no tenderness.  Lymphadenopathy: She has no cervical adenopathy.  Skin: Skin is warm and dry. She is not diaphoretic.  Psychiatric: She has a normal mood and affect. Her behavior is normal.     Lab Results  Component Value Date   WBC 5.4 04/15/2020   HGB 12.5 04/15/2020   HCT 36.7 04/15/2020   PLT 327.0 04/15/2020   GLUCOSE 98 04/15/2020   CHOL 207 (H) 04/15/2020   TRIG 68.0 04/15/2020   HDL 87.70 04/15/2020   LDLDIRECT 104.9 04/23/2007   LDLCALC 106 (H) 04/15/2020   ALT 16 04/15/2020   AST 12 04/15/2020   NA 139 04/15/2020   K 4.2 04/15/2020   CL 106 04/15/2020   CREATININE 0.79 04/15/2020   BUN 17 04/15/2020   CO2 28 04/15/2020   TSH 1.89 04/15/2020   HGBA1C 5.7 04/15/2020         Assessment & Plan:   Physical exam: Screening blood work  ordered Exercise  none, she does walk the dog Weight  has gained weight - needs to work on weight loss Substance abuse  none    Screened for depression using the PHQ 9 scale.  No evidence of depression.   Screened for anxiety using GAD7 Scale.  No evidence of anxiety.    Reviewed recommended immunizations.   Health Maintenance  Topic Date Due   Fecal DNA (Cologuard)  Never done   DEXA SCAN  01/10/2020   COVID-19 Vaccine (4 - Booster for Pfizer series) 05/02/2020   MAMMOGRAM  01/19/2023   TETANUS/TDAP  06/06/2025   Pneumonia Vaccine 50+ Years old  Completed   INFLUENZA VACCINE  Completed   Hepatitis C Screening  Completed   Zoster Vaccines- Shingrix  Completed   HPV VACCINES  Aged Out       She will let me know if her left knee pain and foot pain continues - can refer to  sports medicine   See Problem List for Assessment and Plan of chronic medical problems.

## 2021-04-13 ENCOUNTER — Other Ambulatory Visit: Payer: Self-pay

## 2021-04-13 ENCOUNTER — Ambulatory Visit (INDEPENDENT_AMBULATORY_CARE_PROVIDER_SITE_OTHER): Payer: BC Managed Care – PPO | Admitting: Internal Medicine

## 2021-04-13 VITALS — BP 130/76 | HR 68 | Temp 98.1°F | Ht 66.0 in | Wt 171.8 lb

## 2021-04-13 DIAGNOSIS — Z0000 Encounter for general adult medical examination without abnormal findings: Secondary | ICD-10-CM | POA: Diagnosis not present

## 2021-04-13 DIAGNOSIS — M85859 Other specified disorders of bone density and structure, unspecified thigh: Secondary | ICD-10-CM

## 2021-04-13 DIAGNOSIS — Z1331 Encounter for screening for depression: Secondary | ICD-10-CM

## 2021-04-13 DIAGNOSIS — R7303 Prediabetes: Secondary | ICD-10-CM | POA: Diagnosis not present

## 2021-04-13 DIAGNOSIS — E039 Hypothyroidism, unspecified: Secondary | ICD-10-CM

## 2021-04-13 NOTE — Assessment & Plan Note (Signed)
Chronic  Clinically euthyroid Currently taking levothyroxine 112 mcg daily Check tsh  Titrate med dose if needed 

## 2021-04-13 NOTE — Assessment & Plan Note (Signed)
Chronic dexa scheduled No regular exercise Not taking any calcium and vitamin d

## 2021-04-13 NOTE — Assessment & Plan Note (Signed)
Chronic Check a1c Low sugar / carb diet Stressed regular exercise  

## 2021-04-19 ENCOUNTER — Other Ambulatory Visit (INDEPENDENT_AMBULATORY_CARE_PROVIDER_SITE_OTHER): Payer: BC Managed Care – PPO

## 2021-04-19 DIAGNOSIS — E039 Hypothyroidism, unspecified: Secondary | ICD-10-CM

## 2021-04-19 DIAGNOSIS — R7303 Prediabetes: Secondary | ICD-10-CM

## 2021-04-19 DIAGNOSIS — M85859 Other specified disorders of bone density and structure, unspecified thigh: Secondary | ICD-10-CM

## 2021-04-19 DIAGNOSIS — Z0000 Encounter for general adult medical examination without abnormal findings: Secondary | ICD-10-CM

## 2021-04-19 LAB — LIPID PANEL
Cholesterol: 213 mg/dL — ABNORMAL HIGH (ref 0–200)
HDL: 94.5 mg/dL (ref >39.00)
LDL Cholesterol: 107 mg/dL — ABNORMAL HIGH (ref 0–99)
NonHDL: 118.36
Total CHOL/HDL Ratio: 2
Triglycerides: 59 mg/dL (ref 0.0–149.0)
VLDL: 11.8 mg/dL (ref 0.0–40.0)

## 2021-04-19 LAB — CBC WITH DIFFERENTIAL/PLATELET
Basophils Absolute: 0.1 10*3/uL (ref 0.0–0.1)
Basophils Relative: 1.3 % (ref 0.0–3.0)
Eosinophils Absolute: 0.2 10*3/uL (ref 0.0–0.7)
Eosinophils Relative: 4 % (ref 0.0–5.0)
HCT: 37.9 % (ref 36.0–46.0)
Hemoglobin: 12.5 g/dL (ref 12.0–15.0)
Lymphocytes Relative: 28.3 % (ref 12.0–46.0)
Lymphs Abs: 1.3 10*3/uL (ref 0.7–4.0)
MCHC: 32.9 g/dL (ref 30.0–36.0)
MCV: 88.5 fl (ref 78.0–100.0)
Monocytes Absolute: 0.5 10*3/uL (ref 0.1–1.0)
Monocytes Relative: 10 % (ref 3.0–12.0)
Neutro Abs: 2.7 10*3/uL (ref 1.4–7.7)
Neutrophils Relative %: 56.4 % (ref 43.0–77.0)
Platelets: 314 10*3/uL (ref 150.0–400.0)
RBC: 4.28 Mil/uL (ref 3.87–5.11)
RDW: 13.8 % (ref 11.5–15.5)
WBC: 4.7 10*3/uL (ref 4.0–10.5)

## 2021-04-19 LAB — COMPREHENSIVE METABOLIC PANEL
ALT: 14 U/L (ref 0–35)
AST: 14 U/L (ref 0–37)
Albumin: 3.9 g/dL (ref 3.5–5.2)
Alkaline Phosphatase: 72 U/L (ref 39–117)
BUN: 19 mg/dL (ref 6–23)
CO2: 29 mEq/L (ref 19–32)
Calcium: 9.7 mg/dL (ref 8.4–10.5)
Chloride: 105 mEq/L (ref 96–112)
Creatinine, Ser: 0.78 mg/dL (ref 0.40–1.20)
GFR: 78.08 mL/min (ref >60.00)
Glucose, Bld: 91 mg/dL (ref 70–99)
Potassium: 4.1 mEq/L (ref 3.5–5.1)
Sodium: 141 mEq/L (ref 135–145)
Total Bilirubin: 0.6 mg/dL (ref 0.2–1.2)
Total Protein: 7.3 g/dL (ref 6.0–8.3)

## 2021-04-19 LAB — VITAMIN D 25 HYDROXY (VIT D DEFICIENCY, FRACTURES): VITD: 30.91 ng/mL (ref 30.00–100.00)

## 2021-04-19 LAB — HEMOGLOBIN A1C: Hgb A1c MFr Bld: 5.8 % (ref 4.6–6.5)

## 2021-04-19 LAB — TSH: TSH: 2.77 u[IU]/mL (ref 0.35–5.50)

## 2021-04-20 ENCOUNTER — Encounter: Payer: Self-pay | Admitting: Internal Medicine

## 2021-04-20 NOTE — Progress Notes (Signed)
Outside notes received. Information abstracted. Notes sent to scan.  

## 2021-05-05 ENCOUNTER — Encounter: Payer: Self-pay | Admitting: Internal Medicine

## 2021-05-19 ENCOUNTER — Other Ambulatory Visit: Payer: Self-pay

## 2021-06-01 NOTE — Progress Notes (Signed)
Subjective:    Patient ID: Jaime Rose, female    DOB: [birth year], 71 y.o.   MRN: [id number]  This visit occurred during the SARS-CoV-2 public health emergency.  Safety protocols were in place, including screening questions prior to the visit, additional usage of staff PPE, and extensive cleaning of exam room while observing appropriate contact time as indicated for disinfecting solutions.    HPI The patient is here for an acute visit.  Left ankle, top of foot and arch is swollen x several days.    She is having left knee pain and she sees Dr Latanya Maudlin on Monday.    She fell over thanksgiving and torked the left knee.  She thinks it is the meniscus.  She did the same thing years ago.  This past wekeend her left foot - the arch hurt and the dorsal aspect of the foot hurt. She denies injuries. By mon and wed - walking hurt.  Yesterday it was worse - her friend thought she had gout. There was swelling, ? Warm.  No redness.  It has gotten a little better - has take aleve which has helped.  No fever.       Medications and allergies reviewed with patient and updated if appropriate.  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 90 tablet 3   loratadine (CLARITIN) 10 MG tablet Take 10 mg by mouth as needed.      RESTASIS 0.05 % ophthalmic emulsion      No current facility-administered medications on file prior to visit.    Past Medical History:   Diagnosis Date   ALLERGIC RHINITIS    CHICKENPOX, HX OF 12/21/2009   Qualifier: Diagnosis of  By: Asa Lente MD, Valerie A    Hyperthyroidism    normal TFTs 11/2009 , dx 10/2011   Varicose vein    s/p sclerosis tx summer 2013    Past Surgical History:  Procedure Laterality Date   ENDOVENOUS ABLATION SAPHENOUS VEIN W/ LASER  04-11-2012   endovenous laser ablation right greater saphenous vein and stab phlebectomy  right leg  by Curt Jews MD    Social History   Socioeconomic History   Marital status: Single    Spouse name: Not on file   Number of children: Not on file   Years of education: Not on file   Highest education level: Not on file  Occupational History   Not on file  Tobacco Use   Smoking status: Former    Types: Cigarettes    Quit date: 04/30/1988    Years since quitting: 33.1   Smokeless tobacco: Never  Substance and Sexual Activity   Alcohol use: Yes    Alcohol/week: 0.0 - 1.0 standard drinks    Comment: 1-2 glasses a week   Drug use: No   Sexual activity: Not on file  Other Topics Concern   Not on file  Social History Narrative   Single, lives with 2  dtrs- Ovid Curd & Claiborne Billings      Exercising regularly - walking dogs   Social Determinants of Radio broadcast assistant Strain: Not on [employer] Insecurity: Not on file  Transportation Needs: Not on file  Physical Activity: Not on file  Stress: Not on file  Social Connections: Not on file    Family History  Problem Relation Age of Onset   Heart disease Father    Autoimmune disease Father    Hypertension Father    Other Father        varicose veins, AAA   Stroke Father    Hyperlipidemia Mother    Other Mother        varicose veins, AAA   Stroke Mother    Heart attack Mother 72   Stroke Brother    Hyperlipidemia Brother    Hyperlipidemia Brother    Hyperlipidemia Sister    Diabetes Maternal Grandfather    Diabetes Paternal Grandfather    Breast cancer Maternal Aunt     Review of Systems   Constitutional:  Negative for chills and fever.  Skin:  Negative for color change, rash and wound.      Objective:   Vitals:   06/02/21 1447 06/02/21 1542  BP: (!) 146/70 132/78  Pulse: (!) 58   Temp: 98.1 F (36.7 C)   SpO2: 99%    BP Readings from Last 3 Encounters:  06/02/21 132/78  04/13/21 130/76  04/12/20 128/68   Wt Readings from Last 3 Encounters:  06/02/21 174 lb 6.4 oz (79.1 kg)  04/13/21 171 lb 12.8 oz (77.9 kg)  04/12/20 168 lb (76.2 kg)   Body mass index is 28.15 kg/m.   Physical Exam Constitutional:      General: She is not in acute distress.    Appearance: Normal appearance. She is not ill-appearing.  HENT:     Head: Normocephalic and atraumatic.  Musculoskeletal:     Comments: Left knee with mild effusion.  Tenderness left arch and dorsal aspect of foot - no focal area of pain.  Mild swelling dorsal aspect and ankle.  No pain in ankle.  Normal ROM of ankle and toes  Neurological:     Mental Status: She is alert.           Assessment & Plan:    See Problem List for Assessment and Plan of chronic medical problems.

## 2021-06-02 ENCOUNTER — Encounter: Payer: Self-pay | Admitting: Internal Medicine

## 2021-06-02 ENCOUNTER — Ambulatory Visit (INDEPENDENT_AMBULATORY_CARE_PROVIDER_SITE_OTHER): Payer: BC Managed Care – PPO

## 2021-06-02 ENCOUNTER — Ambulatory Visit: Payer: BC Managed Care – PPO | Admitting: Internal Medicine

## 2021-06-02 ENCOUNTER — Other Ambulatory Visit: Payer: Self-pay

## 2021-06-02 DIAGNOSIS — M79672 Pain in left foot: Secondary | ICD-10-CM

## 2021-06-02 LAB — URIC ACID: Uric Acid, Serum: 4.8 mg/dL (ref 2.4–7.0)

## 2021-06-02 MED ORDER — INDOMETHACIN 50 MG PO CAPS: 50.0000 mg | ORAL_CAPSULE | Freq: Three times a day (TID) | ORAL | 0 refills | Status: DC

## 2021-06-02 NOTE — Patient Instructions (Addendum)
° ° °  Have blood work and an Electronics engineer.     Medications changes include :   indomethacin 50 mg three times a day with food - take for one full day after your pain resolves   Your prescription(s) have been submitted to your pharmacy. Please take as directed and contact our office if you believe you are having problem(s) with the medication(s).

## 2021-06-04 NOTE — Assessment & Plan Note (Addendum)
Acute Not typical for gout - but it could be, could also be pseudogout, other injury Will get xray Will check uric acid - did advise this can be normal even with her having gout Indomethacin 50 mg tid AC until pain improves - expect this to help no matter the cause Will check uric acid level at her next visit as well Sees ortho for knee on monday

## 2021-06-05 ENCOUNTER — Encounter: Payer: Self-pay | Admitting: Internal Medicine

## 2021-06-05 ENCOUNTER — Ambulatory Visit: Payer: BC Managed Care – PPO | Admitting: Internal Medicine

## 2021-06-14 ENCOUNTER — Other Ambulatory Visit: Payer: Self-pay | Admitting: Internal Medicine

## 2021-09-14 ENCOUNTER — Encounter: Payer: Self-pay | Admitting: Internal Medicine

## 2021-09-28 ENCOUNTER — Encounter: Payer: Self-pay | Admitting: Internal Medicine

## 2021-10-09 ENCOUNTER — Encounter: Payer: Self-pay | Admitting: Internal Medicine

## 2021-10-10 ENCOUNTER — Ambulatory Visit
Admission: RE | Admit: 2021-10-10 | Discharge: 2021-10-10 | Disposition: A | Discharge status: Home / Self Care | Payer: BC Managed Care – PPO | Source: Ambulatory Visit | Attending: Internal Medicine | Admitting: Internal Medicine

## 2021-10-10 DIAGNOSIS — M85859 Other specified disorders of bone density and structure, unspecified thigh: Secondary | ICD-10-CM

## 2022-01-29 ENCOUNTER — Encounter: Payer: Self-pay | Admitting: Internal Medicine

## 2022-01-30 DIAGNOSIS — L237 Allergic contact dermatitis due to plants, except food: Secondary | ICD-10-CM | POA: Insufficient documentation

## 2022-01-30 NOTE — Progress Notes (Deleted)
Virtual Visit via Video Note  I connected with Anaja A Moffit on 01/30/22 at  3:40 PM EDT by a video enabled telemedicine application and verified that I am speaking with the correct person using two identifiers.   I discussed the limitations of evaluation and management by telemedicine and the availability of in person appointments. The patient expressed understanding and agreed to proceed.  Present for the visit:  Myself, Dr Billey Gosling, Leola Brazil.  The patient is currently at home and I am in the office.    No referring provider.    History of Present Illness:    Social History   Socioeconomic History   Marital status: Single    Spouse name: Not on file   Number of children: Not on file   Years of education: Not on file   Highest education level: Not on file  Occupational History   Not on file  Tobacco Use   Smoking status: Former    Types: Cigarettes    Quit date: 04/30/1988    Years since quitting: 33.7   Smokeless tobacco: Never  Substance and Sexual Activity   Alcohol use: Yes    Alcohol/week: 0.0 - 1.0 standard drinks of alcohol    Comment: 1-2 glasses a week   Drug use: No   Sexual activity: Not on file  Other Topics Concern   Not on file  Social History Narrative   Single, lives with 2 dtrs- [place]      Exercising regularly - walking dogs   Social Determinants of Health   Financial Resource Strain: Not on file  Food Insecurity: Not on file  Transportation Needs: Not on file  Physical Activity: Not on file  Stress: Not on file  Social Connections: Not on file     Observations/Objective: Appears well in NAD   Assessment and Plan:  See Problem List for Assessment and Plan of chronic medical problems.   Follow Up Instructions:    I discussed the assessment and treatment plan with the patient. The patient was provided an opportunity to ask questions and all were answered. The patient agreed with the plan and demonstrated an  understanding of the instructions.   The patient was advised to call back or seek an in-person evaluation if the symptoms worsen or if the condition fails to improve as anticipated.    Binnie Rail, MD

## 2022-01-31 ENCOUNTER — Encounter: Payer: Self-pay | Admitting: Internal Medicine

## 2022-01-31 ENCOUNTER — Ambulatory Visit (INDEPENDENT_AMBULATORY_CARE_PROVIDER_SITE_OTHER): Payer: Medicare Other | Admitting: Internal Medicine

## 2022-01-31 DIAGNOSIS — L237 Allergic contact dermatitis due to plants, except food: Secondary | ICD-10-CM

## 2022-01-31 DIAGNOSIS — L0100 Impetigo, unspecified: Secondary | ICD-10-CM | POA: Diagnosis not present

## 2022-01-31 MED ORDER — CLOBETASOL PROPIONATE 0.05 % EX CREA: 1.0000 | TOPICAL_CREAM | Freq: Two times a day (BID) | CUTANEOUS | 0 refills | Status: DC

## 2022-01-31 MED ORDER — CEPHALEXIN 500 MG PO CAPS: 500.0000 mg | ORAL_CAPSULE | Freq: Three times a day (TID) | ORAL | 0 refills | Status: AC

## 2022-01-31 NOTE — Assessment & Plan Note (Signed)
Acute Poison oak sumac with yellowish discharge suggesting possible superficial infection impetigo Keflex 500 mg 3 times daily x10 days

## 2022-01-31 NOTE — Progress Notes (Signed)
    Subjective:    Patient ID: Jaime Rose, female    DOB: 22-Sep-1950, 71 y.o.   MRN: [id number]      Jaime Rose is here for  Chief Complaint  Patient presents with   [facility]    About 10 days ago she was gardening and she was wearing long pants and gloves.  Within a couple days she developed a rash with poison ivy, oak or sumac.  She thinks she does have poison sumac on her lot.  It has spread.  She now has it on both arms, neck, abdomen and buttock region.  It is very itchy.  It is oozing a yellowish fluid.  She has been taking Benadryl and applying chamomile lotion.  She is concerned because it is continuing to get worse.  She denies any fevers, chills.  She denies any issues with breathing or any involvement of her mucous membranes.     Medications and allergies reviewed with patient and updated if appropriate.  Current Outpatient Medications on File Prior to Visit  Medication Sig Dispense Refill   diphenhydrAMINE HCl (BENADRYL ALLERGY PO) Take by mouth.     indomethacin (INDOCIN) 50 MG capsule Take 1 capsule (50 mg total) by mouth 3 (three) times daily with meals. 30 capsule 0   levothyroxine (SYNTHROID) 112 MCG tablet TAKE ONE TABLET BY MOUTH DAILY 90 tablet 3   loratadine (CLARITIN) 10 MG tablet Take 10 mg by mouth as needed.      RESTASIS 0.05 % ophthalmic emulsion      No current facility-administered medications on file prior to visit.    Review of Systems     Objective:   Vitals:   01/31/22 1550  BP: 118/80  Pulse: 69  Temp: 98.3 F (36.8 C)  SpO2: 96%   BP Readings from Last 3 Encounters:  01/31/22 118/80  06/02/21 132/78  04/13/21 130/76   Wt Readings from Last 3 Encounters:  01/31/22 170 lb (77.1 kg)  06/02/21 174 lb 6.4 oz (79.1 kg)  04/13/21 171 lb 12.8 oz (77.9 kg)   Body mass index is 27.44 kg/m.    Physical Exam Constitutional:      General: She is not in acute distress.    Appearance: Normal appearance. She is not  ill-appearing.  HENT:     Head: Normocephalic and atraumatic.  Skin:    General: Skin is warm and dry.     Findings: Rash (Rash distal anterior right arm and distal upper left arm, neck, abdomen-several clusters of blisterlike lesions, oozing yellowish colored fluid with erythematous base) present.  Neurological:     Mental Status: She is alert.            Assessment & Plan:    See Problem List for Assessment and Plan of chronic medical problems.

## 2022-01-31 NOTE — Patient Instructions (Addendum)
       Medications changes include :  keflex three times daily x  10 days, clobetasol steroid cream twice a day.     Your prescription(s) have been sent to your pharmacy.     Return if symptoms worsen or fail to improve.

## 2022-01-31 NOTE — Assessment & Plan Note (Signed)
Acute Has a rash related to gardening-likely poison sumac which she believes she does have in her yard Taking Benadryl and that is helping Applying chamomile lotion and that is also helping some The rash has continued to spread and there is a clear-yellow discharge She is concerned because it is getting worse She is steroid intolerant-had a severe reaction years ago and has not had steroids since then-not sure which steroid it was, possibly prednisone Continue Benadryl Continue Lotion Start Keflex 500 mg 3 times daily x10 days for possible impetigo/cellulitis Start clobetasol cream 0.05% twice daily She will update me on how she is doing If there is no improvement we could try a low-dose steroid, but we would want her to have a family member stay with her for this time

## 2022-02-02 ENCOUNTER — Telehealth: Payer: Self-pay

## 2022-02-02 NOTE — Telephone Encounter (Signed)
Yes, the otc Lotrimin would be fine.   thanks

## 2022-02-02 NOTE — Telephone Encounter (Signed)
Called patient and informed her of provider recommendation 

## 2022-02-02 NOTE — Telephone Encounter (Signed)
Patient is calling in saying she wanted to let us know that she had a psychotic side effect/reaction to the medication, Clobetasol. Stopped using it, the fluid drainage has stopped but the rash is still showing up in new spots. Jaime Rose is still continuing the antibiotics and benadryl but wants to know if she should get an antifungal cream or just wait it out.

## 2022-02-22 ENCOUNTER — Other Ambulatory Visit (HOSPITAL_BASED_OUTPATIENT_CLINIC_OR_DEPARTMENT_OTHER): Payer: Self-pay

## 2022-02-22 MED ORDER — INFLUENZA VAC A&B SA ADJ QUAD 0.5 ML IM PRSY
PREFILLED_SYRINGE | INTRAMUSCULAR | 0 refills | Status: DC
  Filled 2022-02-22: qty 0.5, 1d supply, fill #0

## 2022-04-06 ENCOUNTER — Encounter (HOSPITAL_BASED_OUTPATIENT_CLINIC_OR_DEPARTMENT_OTHER): Payer: Self-pay | Admitting: Emergency Medicine

## 2022-04-06 ENCOUNTER — Emergency Department (HOSPITAL_BASED_OUTPATIENT_CLINIC_OR_DEPARTMENT_OTHER)
Admission: EM | Admit: 2022-04-06 | Discharge: 2022-04-06 | Disposition: A | Discharge status: Home / Self Care | Payer: PRIVATE HEALTH INSURANCE | Attending: Emergency Medicine | Admitting: Emergency Medicine

## 2022-04-06 ENCOUNTER — Other Ambulatory Visit: Payer: Self-pay

## 2022-04-06 DIAGNOSIS — Y92000 Kitchen of unspecified non-institutional (private) residence as  the place of occurrence of the external cause: Secondary | ICD-10-CM | POA: Insufficient documentation

## 2022-04-06 DIAGNOSIS — W260XXA Contact with knife, initial encounter: Secondary | ICD-10-CM | POA: Insufficient documentation

## 2022-04-06 DIAGNOSIS — S61211A Laceration without foreign body of left index finger without damage to nail, initial encounter: Secondary | ICD-10-CM | POA: Insufficient documentation

## 2022-04-06 NOTE — ED Provider Notes (Signed)
[facility] DE[facility] Provider Note   CSN: [id number] Arrival date & time: 04/06/22  1842     History  Chief Complaint  Patient presents with   Laceration    Jaime Rose is a 71 y.o. female.  Patient was cooking for a class in the demonstration kitchen at [place].  Cut herself on her right finger with a sharp kitchen knife.  Is not on any blood thinning medications. Last tetanus booster 6 years ago.         Home Medications Prior to Admission medications   Medication Sig Start Date End Date Taking? Authorizing Provider  clobetasol cream (TEMOVATE) [id number] % Apply 1 Application topically 2 (two) times daily. 01/31/22   Binnie Rail, MD  diphenhydrAMINE HCl (BENADRYL ALLERGY PO) Take by mouth.    [provider]  indomethacin (INDOCIN) 50 MG capsule Take 1 capsule (50 mg total) by mouth 3 (three) times daily with meals. 06/02/21   Binnie Rail, MD  influenza vaccine adjuvanted (FLUAD) 0.5 ML injection Inject into the muscle. 02/22/22   Carlyle Basques, MD  levothyroxine (SYNTHROID) 112 MCG tablet TAKE ONE TABLET BY MOUTH DAILY 06/14/21   Binnie Rail, MD  loratadine (CLARITIN) 10 MG tablet Take 10 mg by mouth as needed.     [provider]  RESTASIS 0.05 % ophthalmic emulsion  05/12/21   [provider]      Allergies    Prednisone    Review of Systems   Review of Systems  Musculoskeletal:  Negative for arthralgias and joint swelling.  Hematological:  Does not bruise/bleed easily.  All other systems reviewed and are negative.   Physical Exam Updated Vital Signs BP (!) 157/69   Pulse (!) 56   Temp 98 F (36.7 C)   Resp 16   SpO2 100%  Physical Exam Vitals reviewed.  Constitutional:      General: She is not in acute distress. HENT:     Mouth/Throat:     Mouth: Mucous membranes are moist.  Eyes:     Extraocular Movements: Extraocular movements intact.  Cardiovascular:     Rate and Rhythm: Normal rate and  regular rhythm.  Musculoskeletal:     Comments: FROM about DIP and PIP joints of affected finger  Skin:    Comments: Shallow 1.5cm laceration to the side of the distal phalanx of the left pointer finger. Does not appear to extend through the entire dermis. Minimal bleeding.    Neurological:     Comments: Normal sensation to fingertip     ED Results / Procedures / Treatments   Labs (all labs ordered are listed, but only abnormal results are displayed) Labs Reviewed - No data to display  EKG None  Radiology No results found.  Procedures Procedures    Medications Ordered in ED Medications - No data to display  ED Course/ Medical Decision Making/ A&P                           Medical Decision Making Superficial laceration without evidence of neurovascular or tendinous compromise..  Nothing that warrants intervention.  Not prone to bleeding nor on blood thinning medications.  Finger bandaged with topical antibiotic ointment.  Safe to return home. UTD on tetanus.    Final Clinical Impression(s) / ED Diagnoses Final diagnoses:  Laceration of left index finger without foreign body without damage to nail, initial encounter    Rx / DC Orders ED  Discharge Orders     None      Dorothyann Gibbs, MD    Alicia Amel, MD 04/06/22 1943    Sloan Leiter, DO 04/07/22 2016

## 2022-04-06 NOTE — Discharge Instructions (Addendum)
Ms Brickman,  It does not look like the cut on your finger is very deep at all. This should heal up just fine on its own. There's nothing for [place] to sew up. Hold pressure on it to help with the bleeding. We will wrap it up well for you with some topical antibiotics. Enjoy dinner!   Dorothyann Gibbs, MD

## 2022-04-06 NOTE — ED Notes (Signed)
Laceration bandaged.

## 2022-04-06 NOTE — ED Triage Notes (Signed)
Lac left pointer finer. Cut with sharp knife during cooking demo.

## 2022-04-11 ENCOUNTER — Telehealth: Payer: Self-pay

## 2022-04-11 NOTE — Telephone Encounter (Signed)
Transition Care Management Unsuccessful Follow-up Telephone Call  Date of discharge and from where:  [facility] on 04/06/2022  Attempts:  1st Attempt  Reason for unsuccessful TCM follow-up call:  Unable to leave message; mailbox full  [employer]. Reymundo Poll, LPN Nurse Health Advisor [facility]  Site: [facility] at [facility]

## 2022-04-19 ENCOUNTER — Encounter: Payer: BC Managed Care – PPO | Admitting: Internal Medicine

## 2022-05-06 ENCOUNTER — Encounter: Payer: Self-pay | Admitting: Internal Medicine

## 2022-05-06 NOTE — Progress Notes (Unsigned)
Subjective:    Patient ID: Jaime Rose, female    DOB: [birth year], 72 y.o.   MRN: [id number]      HPI Jaime Rose is here for a Physical exam.    No changes in health.  Left knee pain - will see ortho at [facility].   DEXA very close to needing treatment - has started exercising.     Medications and allergies reviewed with patient and updated if appropriate.  Current Outpatient Medications on File Prior to Visit  Medication Sig Dispense Refill   diphenhydrAMINE HCl (BENADRYL ALLERGY PO) Take by mouth.     levothyroxine (SYNTHROID) 112 MCG tablet TAKE ONE TABLET BY MOUTH DAILY 90 tablet 3   loratadine (CLARITIN) 10 MG tablet Take 10 mg by mouth as needed.      RESTASIS 0.05 % ophthalmic emulsion      No current facility-administered medications on file prior to visit.    Review of Systems  Constitutional:  Negative for fever.  Eyes:  Negative for visual disturbance.  Respiratory:  Negative for cough, shortness of breath and wheezing.   Cardiovascular:  Negative for chest pain, palpitations and leg swelling.  Gastrointestinal:  Negative for abdominal pain, blood in stool, constipation, diarrhea and nausea.       No gerd  Genitourinary:  Negative for dysuria.  Musculoskeletal:  Positive for arthralgias (eft knee). Negative for back pain.  Skin:  Negative for rash.  Neurological:  Negative for light-headedness and headaches.  Psychiatric/Behavioral:  Negative for dysphoric mood. The patient is not nervous/anxious.        Objective:   Vitals:   05/07/22 0854  BP: 138/76  Pulse: (!) 52  Temp: 98 F (36.7 C)  SpO2: 99%   Filed Weights   05/07/22 0854  Weight: 169 lb (76.7 kg)   Body mass index is 27.28 kg/m.  BP Readings from Last 3 Encounters:  05/07/22 138/76  04/06/22 (!) 160/69  01/31/22 118/80    Wt Readings from Last 3 Encounters:  05/07/22 169 lb (76.7 kg)  01/31/22 170 lb (77.1 kg)  06/02/21 174 lb 6.4 oz (79.1 kg)       Physical  Exam Constitutional: She appears well-developed and well-nourished. No distress.  HENT:  Head: Normocephalic and atraumatic.  Right Ear: External ear normal. Normal ear canal and TM Left Ear: External ear normal.  Normal ear canal and TM Mouth/Throat: Oropharynx is clear and moist.  Eyes: Conjunctivae normal.  Neck: Neck supple. No tracheal deviation present. No thyromegaly present.  No carotid bruit  Cardiovascular: Normal rate, regular rhythm and normal heart sounds.   1/6 sys murmur heard.  No edema. Pulmonary/Chest: Effort normal and breath sounds normal. No respiratory distress. She has no wheezes. She has no rales.  Breast: deferred   Abdominal: Soft. She exhibits no distension. There is no tenderness.  Lymphadenopathy: She has no cervical adenopathy.  Skin: Skin is warm and dry. She is not diaphoretic.  Psychiatric: She has a normal mood and affect. Her behavior is normal.     Lab Results  Component Value Date   WBC 4.7 04/19/2021   HGB 12.5 04/19/2021   HCT 37.9 04/19/2021   PLT 314.0 04/19/2021   GLUCOSE 91 04/19/2021   CHOL 213 (H) 04/19/2021   TRIG 59.0 04/19/2021   HDL 94.50 04/19/2021   LDLDIRECT 104.9 04/23/2007   LDLCALC 107 (H) 04/19/2021   ALT 14 04/19/2021   AST 14 04/19/2021   NA 141 04/19/2021   K  4.1 04/19/2021   CL 105 04/19/2021   CREATININE 0.78 04/19/2021   BUN 19 04/19/2021   CO2 29 04/19/2021   TSH 2.77 04/19/2021   HGBA1C 5.8 04/19/2021         Assessment & Plan:   Physical exam: Screening blood work  ordered Exercise  walking Weight  mildly overweight Substance abuse  none  Will see derm for skin cancer screening   Reviewed recommended immunizations.   Health Maintenance  Topic Date Due   Medicare Annual Wellness (AWV)  Never done   COVID-19 Vaccine (4 - 2023-24 season) 05/23/2022 (Originally 12/29/2021)   MAMMOGRAM  01/19/2023   Fecal DNA (Cologuard)  06/28/2023   DEXA SCAN  10/11/2023   DTaP/Tdap/Td (2 - Td or Tdap)  06/06/2025   Pneumonia Vaccine 9+ Years old  Completed   INFLUENZA VACCINE  Completed   Hepatitis C Screening  Completed   Zoster Vaccines- Shingrix  Completed   HPV VACCINES  Aged Out          See Problem List for Assessment and Plan of chronic medical problems.

## 2022-05-06 NOTE — Patient Instructions (Addendum)
Blood work was ordered.   The lab is on the first floor.    Medications changes include :   Take calcium 500-600 mg daily and vitamin D 1000-2000 units daily.  If you are not having a high calcium diet take calcium 500-600 mg twice daily.     Return in about 1 year (around 05/08/2023) for Physical Exam.   Health Maintenance, Female Adopting a healthy lifestyle and getting preventive care are important in promoting health and wellness. Ask your health care provider about: The right schedule for you to have regular tests and exams. Things you can do on your own to prevent diseases and keep yourself healthy. What should I know about diet, weight, and exercise? Eat a healthy diet  Eat a diet that includes plenty of vegetables, fruits, low-fat dairy products, and lean protein. Do not eat a lot of foods that are high in solid fats, added sugars, or sodium. Maintain a healthy weight Body mass index (BMI) is used to identify weight problems. It estimates body fat based on height and weight. Your health care provider can help determine your BMI and help you achieve or maintain a healthy weight. Get regular exercise Get regular exercise. This is one of the most important things you can do for your health. Most adults should: Exercise for at least 150 minutes each week. The exercise should increase your heart rate and make you sweat (moderate-intensity exercise). Do strengthening exercises at least twice a week. This is in addition to the moderate-intensity exercise. Spend less time sitting. Even light physical activity can be beneficial. Watch cholesterol and blood lipids Have your blood tested for lipids and cholesterol at 70 years of age, then have this test every 5 years. Have your cholesterol levels checked more often if: Your lipid or cholesterol levels are high. You are older than 70 years of age. You are at high risk for heart disease. What should I know about cancer  screening? Depending on your health history and family history, you may need to have cancer screening at various ages. This may include screening for: Breast cancer. Cervical cancer. Colorectal cancer. Skin cancer. Lung cancer. What should I know about heart disease, diabetes, and high blood pressure? Blood pressure and heart disease High blood pressure causes heart disease and increases the risk of stroke. This is more likely to develop in people who have high blood pressure readings or are overweight. Have your blood pressure checked: Every 3-5 years if you are 28-23 years of age. Every year if you are 54 years old or older. Diabetes Have regular diabetes screenings. This checks your fasting blood sugar level. Have the screening done: Once every three years after age 22 if you are at a normal weight and have a low risk for diabetes. More often and at a younger age if you are overweight or have a high risk for diabetes. What should I know about preventing infection? Hepatitis B If you have a higher risk for hepatitis B, you should be screened for this virus. Talk with your health care provider to find out if you are at risk for hepatitis B infection. Hepatitis C Testing is recommended for: Everyone born from [birth year] through 1963. Anyone with known risk factors for hepatitis C. Sexually transmitted infections (STIs) Get screened for STIs, including gonorrhea and chlamydia, if: You are sexually active and are younger than 70 years of age. You are older than 70 years of age and your health care provider  tells you that you are at risk for this type of infection. Your sexual activity has changed since you were last screened, and you are at increased risk for chlamydia or gonorrhea. Ask your health care provider if you are at risk. Ask your health care provider about whether you are at high risk for HIV. Your health care provider may recommend a prescription medicine to help prevent HIV  infection. If you choose to take medicine to prevent HIV, you should first get tested for HIV. You should then be tested every 3 months for as long as you are taking the medicine. Pregnancy If you are about to stop having your period (premenopausal) and you may become pregnant, seek counseling before you get pregnant. Take 400 to 800 micrograms (mcg) of folic acid every day if you become pregnant. Ask for birth control (contraception) if you want to prevent pregnancy. Osteoporosis and menopause Osteoporosis is a disease in which the bones lose minerals and strength with aging. This can result in bone fractures. If you are 49 years old or older, or if you are at risk for osteoporosis and fractures, ask your health care provider if you should: Be screened for bone loss. Take a calcium or vitamin D supplement to lower your risk of fractures. Be given hormone replacement therapy (HRT) to treat symptoms of menopause. Follow these instructions at home: Alcohol use Do not drink alcohol if: Your health care provider tells you not to drink. You are pregnant, may be pregnant, or are planning to become pregnant. If you drink alcohol: Limit how much you have to: 0-1 drink a day. Know how much alcohol is in your drink. In the [place] one drink equals one 12 oz bottle of beer (355 mL), one 5 oz glass of wine (148 mL), or one 1 oz glass of hard liquor (44 mL). Lifestyle Do not use any products that contain nicotine or tobacco. These products include cigarettes, chewing tobacco, and vaping devices, such as e-cigarettes. If you need help quitting, ask your health care provider. Do not use street drugs. Do not share needles. Ask your health care provider for help if you need support or information about quitting drugs. General instructions Schedule regular health, dental, and eye exams. Stay current with your vaccines. Tell your health care provider if: You often feel depressed. You have ever been abused  or do not feel safe at home. Summary Adopting a healthy lifestyle and getting preventive care are important in promoting health and wellness. Follow your health care provider's instructions about healthy diet, exercising, and getting tested or screened for diseases. Follow your health care provider's instructions on monitoring your cholesterol and blood pressure. This information is not intended to replace advice given to you by your health care provider. Make sure you discuss any questions you have with your health care provider. Document Revised: 09/05/2020 Document Reviewed: 09/05/2020 Elsevier Patient Education  [place].

## 2022-05-07 ENCOUNTER — Encounter: Payer: Self-pay | Admitting: Internal Medicine

## 2022-05-07 ENCOUNTER — Ambulatory Visit (INDEPENDENT_AMBULATORY_CARE_PROVIDER_SITE_OTHER): Payer: Medicare PPO | Admitting: Internal Medicine

## 2022-05-07 VITALS — BP 138/76 | HR 52 | Temp 98.0°F | Ht 66.0 in | Wt 169.0 lb

## 2022-05-07 DIAGNOSIS — M85859 Other specified disorders of bone density and structure, unspecified thigh: Secondary | ICD-10-CM

## 2022-05-07 DIAGNOSIS — E890 Postprocedural hypothyroidism: Secondary | ICD-10-CM

## 2022-05-07 DIAGNOSIS — Z0000 Encounter for general adult medical examination without abnormal findings: Secondary | ICD-10-CM

## 2022-05-07 DIAGNOSIS — E039 Hypothyroidism, unspecified: Secondary | ICD-10-CM

## 2022-05-07 DIAGNOSIS — R7303 Prediabetes: Secondary | ICD-10-CM | POA: Diagnosis not present

## 2022-05-07 LAB — CBC WITH DIFFERENTIAL/PLATELET
Basophils Absolute: 0.1 10*3/uL (ref 0.0–0.1)
Basophils Relative: 1 % (ref 0.0–3.0)
Eosinophils Absolute: 0.2 10*3/uL (ref 0.0–0.7)
Eosinophils Relative: 3.8 % (ref 0.0–5.0)
HCT: 38 % (ref 36.0–46.0)
Hemoglobin: 12.6 g/dL (ref 12.0–15.0)
Lymphocytes Relative: 25.6 % (ref 12.0–46.0)
Lymphs Abs: 1.5 10*3/uL (ref 0.7–4.0)
MCHC: 33.1 g/dL (ref 30.0–36.0)
MCV: 89.4 fl (ref 78.0–100.0)
Monocytes Absolute: 0.6 10*3/uL (ref 0.1–1.0)
Monocytes Relative: 10.5 % (ref 3.0–12.0)
Neutro Abs: 3.5 10*3/uL (ref 1.4–7.7)
Neutrophils Relative %: 59.1 % (ref 43.0–77.0)
Platelets: 352 10*3/uL (ref 150.0–400.0)
RBC: 4.26 Mil/uL (ref 3.87–5.11)
RDW: 13.4 % (ref 11.5–15.5)
WBC: 5.9 10*3/uL (ref 4.0–10.5)

## 2022-05-07 LAB — LIPID PANEL
Cholesterol: 204 mg/dL — ABNORMAL HIGH (ref 0–200)
HDL: 86.2 mg/dL (ref >39.00)
LDL Cholesterol: 107 mg/dL — ABNORMAL HIGH (ref 0–99)
NonHDL: 117.37
Total CHOL/HDL Ratio: 2
Triglycerides: 51 mg/dL (ref 0.0–149.0)
VLDL: 10.2 mg/dL (ref 0.0–40.0)

## 2022-05-07 LAB — COMPREHENSIVE METABOLIC PANEL
ALT: 21 U/L (ref 0–35)
AST: 19 U/L (ref 0–37)
Albumin: 4.1 g/dL (ref 3.5–5.2)
Alkaline Phosphatase: 76 U/L (ref 39–117)
BUN: 18 mg/dL (ref 6–23)
CO2: 30 mEq/L (ref 19–32)
Calcium: 9.9 mg/dL (ref 8.4–10.5)
Chloride: 103 mEq/L (ref 96–112)
Creatinine, Ser: 0.7 mg/dL (ref 0.40–1.20)
GFR: 88.26 mL/min (ref >60.00)
Glucose, Bld: 93 mg/dL (ref 70–99)
Potassium: 4.1 mEq/L (ref 3.5–5.1)
Sodium: 139 mEq/L (ref 135–145)
Total Bilirubin: 0.5 mg/dL (ref 0.2–1.2)
Total Protein: 7.7 g/dL (ref 6.0–8.3)

## 2022-05-07 LAB — VITAMIN D 25 HYDROXY (VIT D DEFICIENCY, FRACTURES): VITD: 28.35 ng/mL — ABNORMAL LOW (ref 30.00–100.00)

## 2022-05-07 LAB — HEMOGLOBIN A1C: Hgb A1c MFr Bld: 5.8 % (ref 4.6–6.5)

## 2022-05-07 LAB — TSH: TSH: 6.98 u[IU]/mL — ABNORMAL HIGH (ref 0.35–5.50)

## 2022-05-07 NOTE — Assessment & Plan Note (Signed)
Chronic  Clinically euthyroid Check tsh and will titrate med dose if needed Currently taking levothyroxine 112 mcg daily  

## 2022-05-07 NOTE — Assessment & Plan Note (Signed)
Chronic DEXA up-to-date-reviewed with her-she is very close to needing treatment Encouraged regular exercise Advised taking calcium 500-600 mg daily and vitamin D 1000 units daily

## 2022-05-07 NOTE — Assessment & Plan Note (Signed)
Chronic Check a1c Low sugar / carb diet Stressed regular exercise  

## 2022-05-09 ENCOUNTER — Other Ambulatory Visit: Payer: Self-pay

## 2022-05-09 ENCOUNTER — Ambulatory Visit (INDEPENDENT_AMBULATORY_CARE_PROVIDER_SITE_OTHER): Payer: Medicare PPO

## 2022-05-09 ENCOUNTER — Encounter (HOSPITAL_BASED_OUTPATIENT_CLINIC_OR_DEPARTMENT_OTHER): Payer: Self-pay | Admitting: Physical Therapy

## 2022-05-09 ENCOUNTER — Ambulatory Visit (HOSPITAL_BASED_OUTPATIENT_CLINIC_OR_DEPARTMENT_OTHER): Payer: Medicare PPO | Admitting: Orthopaedic Surgery

## 2022-05-09 ENCOUNTER — Ambulatory Visit (HOSPITAL_BASED_OUTPATIENT_CLINIC_OR_DEPARTMENT_OTHER): Payer: Medicare PPO | Attending: Orthopaedic Surgery | Admitting: Physical Therapy

## 2022-05-09 DIAGNOSIS — G8929 Other chronic pain: Secondary | ICD-10-CM | POA: Diagnosis not present

## 2022-05-09 DIAGNOSIS — M25562 Pain in left knee: Secondary | ICD-10-CM

## 2022-05-09 DIAGNOSIS — M25662 Stiffness of left knee, not elsewhere classified: Secondary | ICD-10-CM | POA: Insufficient documentation

## 2022-05-09 DIAGNOSIS — M1712 Unilateral primary osteoarthritis, left knee: Secondary | ICD-10-CM

## 2022-05-09 DIAGNOSIS — R2689 Other abnormalities of gait and mobility: Secondary | ICD-10-CM | POA: Diagnosis not present

## 2022-05-09 DIAGNOSIS — M6281 Muscle weakness (generalized): Secondary | ICD-10-CM | POA: Diagnosis not present

## 2022-05-09 NOTE — Therapy (Signed)
OUTPATIENT PHYSICAL THERAPY LOWER EXTREMITY EVALUATION   Patient Name: Jaime Rose MRN: [id number] DOB:1950-10-29, 72 y.o., female Today's Date: 05/10/2022  END OF SESSION:  PT End of Session - 05/09/22 1605     Visit Number 1    Number of Visits 16    Date for PT Re-Evaluation 07/04/22    Authorization Type [employer]    PT Start Time 1515    PT Stop Time 1600    PT Time Calculation (min) 45 min    Activity Tolerance Patient tolerated treatment well    Behavior During Therapy WFL for tasks assessed/performed             Past Medical History:  Diagnosis Date   ALLERGIC RHINITIS    CHICKENPOX, HX OF 12/21/2009   Qualifier: Diagnosis of  By: Asa Lente MD, Valerie A    Hyperthyroidism    normal TFTs 11/2009 , dx 10/2011   Varicose vein    s/p sclerosis tx summer 2013   Past Surgical History:  Procedure Laterality Date   ENDOVENOUS ABLATION SAPHENOUS VEIN W/ LASER  04-11-2012   endovenous laser ablation right greater saphenous vein and stab phlebectomy  right leg  by Curt Jews MD   Patient Active Problem List   Diagnosis Date Noted   Foot pain, left 06/02/2021   Ptosis of right eyelid 04/12/2020   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 12/29/2018   Osteopenia 01/10/2017   Right carotid bruit 12/21/2016   Prediabetes 12/21/2016   Hypothyroidism 03/20/2012   Varicose veins of lower extremities with other complications 53/29/9242   ALLERGIC RHINITIS 12/21/2009   Cardiac murmur 12/21/2009    PCP: Billey Gosling  REFERRING PROVIDER: Vanetta Mulders, MD  REFERRING DIAG: [phone] (ICD-10-CM) - Unilateral primary osteoarthritis, left knee  THERAPY DIAG:  Chronic pain of left knee  Stiffness of left knee, not elsewhere classified  Muscle weakness (generalized)  Other abnormalities of gait and mobility  Rationale for Evaluation and Treatment: Rehabilitation  ONSET DATE: Multiple years  SUBJECTIVE:   SUBJECTIVE STATEMENT: Pt states her knee has been a problem  for years. Pt notes that knee was exacerbated in Feb when her foot was swollen (cause is unknown). Pt points to pain primarily along the lateral knee. Pt states it's not excruciating but if she exerts herself she can feel the pain (I.e. going to [place] and going up/down the stairs and hills). Pt with plans to go to [place] in April. It does get stiff over night. Pt states she is doing HEP for her foot/ankle that she was given by PT.   PERTINENT HISTORY: From PT order: Possible aquatic candidate  Hip girdle strengthening and LE strengthening for OA  PAIN:  Are you having pain? Yes: NPRS scale: 0 currently,  5 at worst/10 Pain location: L lateral knee pain Pain description: Dull/achy Aggravating factors: Knee bending, descending stairs Relieving factors: Ibuprofen,   PRECAUTIONS: None  WEIGHT BEARING RESTRICTIONS: No  FALLS:  Has patient fallen in last 6 months? No  LIVING ENVIRONMENT: Lives with: lives alone Lives in: House/apartment Stairs: Yes: Internal: 14-15 steps; can reach both and External: 4-5 steps; can reach both Has following equipment at home: None  OCCUPATION: Nutritionist  PLOF: Independent  PATIENT GOALS: Decrease the "waddle", go up/down stairs   NEXT MD VISIT:   OBJECTIVE:   DIAGNOSTIC FINDINGS: x-ray performed 05/09/22 -- results pending.  PATIENT SURVEYS:  [name] 65; predicted 25  COGNITION: Overall cognitive status: Within functional limits for tasks assessed  SENSATION: WFL  EDEMA:  None currently  MUSCLE LENGTH: Hamstrings: Right 80 deg; Left <60 deg (likely due to reduced knee ROM) Thomas test: WFL  POSTURE:   Increased knee valgus with Q angle >20 deg on L (greater in standing than supine). Patellar malalignment also noted -- appears slightly lateral to center in supine. R iliac crest higher than L in standing  PALPATION: TTP L lateral knee and glute med. Hypomobile patellar mobility  LOWER EXTREMITY ROM:  Active ROM Right eval  Left eval  Hip flexion    Hip extension    Hip abduction    Hip adduction    Hip internal rotation    Hip external rotation    Knee flexion 120 100  Knee extension 0 -8  Ankle dorsiflexion    Ankle plantarflexion    Ankle inversion    Ankle eversion     (Blank rows = not tested)  LOWER EXTREMITY MMT:  MMT Right eval Left eval  Hip flexion 4+ 4-  Hip extension 3+ 3  Hip abduction 4- 3  Hip adduction    Hip internal rotation    Hip external rotation    Knee flexion 5 5  Knee extension 5 5 (at 10 deg knee flex)  Ankle dorsiflexion    Ankle plantarflexion    Ankle inversion    Ankle eversion     (Blank rows = not tested)  LOWER EXTREMITY SPECIAL TESTS:  Hip special tests: [name] (FABER) test: negative, Trendelenburg test: positive , Ober's test: positive , and Ely's test: negative  FUNCTIONAL TESTS:  SLS: 5 sec R, 1 sec L Squatting demos increased L knee valgus, R weight shift, decreased knee flexion to ~60 deg -- pt compensates into a lunge if she has to squat lower Stair descent demos poor eccentric quad strength with increased valgus  GAIT: Distance walked: 100' Assistive device utilized: None Level of assistance: Complete Independence Comments: Widened BOS, antalgic, compensated trendelenburg with trunk lean during stance phase   TODAY'S TREATMENT:                                                                                                                              DATE: 05/09/22 See HEP  Has been doing the following from prior PT exercises: SLR (has extension lag) Ankle pumps Ankle ABCs Heel slides with strap  PATIENT EDUCATION:  Education details: Findings, initial HEP, POC Person educated: Patient Education method: Explanation, Demonstration, and Handouts Education comprehension: verbalized understanding, returned demonstration, and needs further education  HOME EXERCISE PROGRAM: Access Code: [email or web address] URL:  [email or web address] Date: 05/10/2022 Prepared by: Estill Bamberg April Thurnell Garbe  Exercises - Seated Hamstring Stretch  - 2 x daily - 7 x weekly - 2 sets - 30 sec hold - Seated Quad Set  - 2 x daily - 7 x weekly - 2 sets - 10 reps - 3 sec hold - Supine ITB Stretch with Strap  - 1 x daily -  7 x weekly - 2 sets - 30 sec hold - Clamshell with Resistance  - 1 x daily - 7 x weekly - 2 sets - 10 reps  ASSESSMENT:  CLINICAL IMPRESSION: Patient is a 72 y.o. F who was seen today for physical therapy evaluation and treatment for chronic L knee pain. Assessment significant for reduced knee ROM (hypomobile and with noted muscular tightness in quads and hamstrings), significant glute and terminal knee extension weakness, high Q angle and patellar malalignment, and pain affecting community activities, travel and leisure. Gait is antalgic with increased trunk lean during stance phase likely due to knee ROM deficits and weak hip abductors. Pt has goals to improve her gait pattern and overall mobility for traveling (plans to go to [place] in April). Pt would benefit from PT to address these issues.   OBJECTIVE IMPAIRMENTS: Abnormal gait, decreased balance, decreased endurance, decreased mobility, difficulty walking, decreased ROM, decreased strength, hypomobility, increased fascial restrictions, impaired flexibility, postural dysfunction, and pain.   ACTIVITY LIMITATIONS: standing, squatting, stairs, and locomotion level  PARTICIPATION LIMITATIONS: community activity, yard work, and travel  PERSONAL FACTORS: Fitness, Past/current experiences, and Time since onset of injury/illness/exacerbation are also affecting patient's functional outcome.   REHAB POTENTIAL: Excellent  CLINICAL DECISION MAKING: Stable/uncomplicated  EVALUATION COMPLEXITY: Low   GOALS: Goals reviewed with patient? Yes  SHORT TERM GOALS: Target date: 06/07/2022  Pt will be ind with initial HEP Baseline: Goal status:  INITIAL  2.  Pt will demo L knee ROM >/= 5-105 deg for improved stair ascent/descent Baseline:  Goal status: INITIAL    LONG TERM GOALS: Target date: 07/05/2022   Pt will be ind with progression and advancement of HEP Baseline:  Goal status: INITIAL  2.  Pt will demo L knee ROM of >/= 5 - 115 deg for improved stair/hill ascent/descent Baseline:  Goal status: INITIAL  3.  Pt will be able to demo R & L SLS of >/=10 sec to show improved bilat LE stability Baseline: <5 sec Goal status: INITIAL  4.  Pt will be able to squat at least 20# lower than 70 deg of knee flexion to demo improved functional LE strength for lifting and carrying Baseline: Currently performs with compensation and pain Goal status: INITIAL  5.  Pt will demo normal reciprocal gait pattern without compensated trunk lean x1000' for community mobility Baseline:  Goal status: INITIAL  6.  Pt will have improved [name] score to >/=73 Baseline:  Goal status: INITIAL   PLAN:  PT FREQUENCY: 2x/week  PT DURATION: 8 weeks  PLANNED INTERVENTIONS: Therapeutic exercises, Therapeutic activity, Neuromuscular re-education, Balance training, Gait training, Patient/Family education, Self Care, Joint mobilization, Stair training, Orthotic/Fit training, Aquatic Therapy, Dry Needling, Electrical stimulation, Cryotherapy, Moist heat, Taping, Vasopneumatic device, Ultrasound, Ionotophoresis 4mg /ml Dexamethasone, Manual therapy, and Re-evaluation  PLAN FOR NEXT SESSION: Assess response to HEP. Work on knee ROM. Quad and glute stretching and strengthening. Progress to closed chain exercises as tolerated.    Anjeli Casad April Ma L Xavian Hardcastle, PT 05/10/2022, 8:57 AM  Referring diagnosis? M17.12 (ICD-10-CM) - Unilateral primary osteoarthritis, left knee Treatment diagnosis? (if different than referring diagnosis) M25.562 Chronic pain of Left Knee What was this (referring dx) caused by? []  Surgery []  Fall [x]  Ongoing issue [x]  Arthritis []   Other: ____________  Laterality: []  Rt [x]  Lt []  Both  Check all possible CPT codes:  *CHOOSE 10 OR LESS*    [x]  97110 (Therapeutic Exercise)  []  92507 (SLP Treatment)  [x]  97112 (Neuro Re-ed)   []   56213 (Swallowing Treatment)   [x]  [phone] (Gait Training)   []  [phone] (Cognitive Training, 1st 15 minutes) [x]  97140 (Manual Therapy)   []  97130 (Cognitive Training, each add'l 15 minutes)  [x]  (Re-evaluation)                              []  Other, List CPT Code ____________  [x]  97530 (Therapeutic Activities)     [x]  97535 (Self Care)   [x]  All codes above (97110 - 97535)  []  97012 (Mechanical Traction)  [x]  97014 (E-stim Unattended)  []  97032 (E-stim manual)  [x]  97033 (Ionto)  []  97035 (Ultrasound) []  97750 (Physical Performance Training) [x]  84696 (Aquatic Therapy) [x]  97016 (Vasopneumatic Device) []  (Paraffin) []  97034 (Contrast Bath) []  97597 (Wound Care 1st 20 sq cm) []  97598 (Wound Care each add'l 20 sq cm) []  97760 (Orthotic Fabrication, Fitting, Training Initial) []  (Prosthetic Management and Training Initial) []  [phone] (Orthotic or Prosthetic Training/ Modification Subsequent)

## 2022-05-09 NOTE — Progress Notes (Signed)
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  This has been chronic in nature.  This has been worsened after her trip to [place].  She is having pain going up and down stairs.  She states that she did have an injection in 2023 which caused significant side effects after steroid use.  She has been doing physical therapy in terms of a home exercise program but not a formalized program.  The pain is predominantly in the involving the lateral joint line.  She has not tried any bracing before.  She does state that she walks with a bit of a limp    Surgical History:   none  PMH/PSH/Family History/Social History/Meds/Allergies:    Past Medical History:  Diagnosis Date   ALLERGIC RHINITIS    CHICKENPOX, HX OF 12/21/2009   Qualifier: Diagnosis of  By: Asa Lente MD, Valerie A    Hyperthyroidism    normal TFTs 11/2009 , dx 10/2011   Varicose vein    s/p sclerosis tx summer 2013   Past Surgical History:  Procedure Laterality Date   ENDOVENOUS ABLATION SAPHENOUS VEIN W/ LASER  04-11-2012   endovenous laser ablation right greater saphenous vein and stab phlebectomy  right leg  by Curt Jews MD   Social History   Socioeconomic History   Marital status: Single    Spouse name: Not on file   Number of children: Not on file   Years of education: Not on file   Highest education level: Not on file  Occupational History   Not on file  Tobacco Use   Smoking status: Former    Types: Cigarettes    Quit date: 04/30/1988    Years since quitting: 34.0   Smokeless tobacco: Never  Substance and Sexual Activity   Alcohol use: Yes    Alcohol/week: 0.0 - 1.0 standard drinks of alcohol    Comment: 1-2 glasses a week   Drug use: No   Sexual activity: Not on file  Other  Topics Concern   Not on file  Social History Narrative   Single, lives with 2 dtrs- Ovid Curd & Claiborne Billings      Exercising regularly - walking dogs   Social Determinants of Radio broadcast assistant Strain: Not on file  Food Insecurity: Not on file  Transportation Needs: Not on file  Physical Activity: Not on file  Stress: Not on file  Social Connections: Not on file   Family History  Problem Relation Age of Onset   Heart disease Father    Autoimmune disease Father    Hypertension Father    Other Father        varicose veins, AAA   Stroke Father    Hyperlipidemia Mother    Other Mother        varicose veins, AAA   Stroke Mother    Heart attack Mother 29   Stroke Brother    Hyperlipidemia Brother    Hyperlipidemia Brother    Hyperlipidemia Sister    Diabetes Maternal Grandfather    Diabetes Paternal Grandfather    Breast cancer Maternal Aunt  Allergies  Allergen Reactions   Prednisone Other (See Comments)    Very agitated and near psychotic per patient-this was several years ago-not exactly sure if was prednisone or different steroid   Clobetasol Propionate Other (See Comments)    anxious   Current Outpatient Medications  Medication Sig Dispense Refill   diphenhydrAMINE HCl (BENADRYL ALLERGY PO) Take by mouth.     levothyroxine (SYNTHROID) 112 MCG tablet TAKE ONE TABLET BY MOUTH DAILY 90 tablet 3   loratadine (CLARITIN) 10 MG tablet Take 10 mg by mouth as needed.      RESTASIS 0.05 % ophthalmic emulsion      No current facility-administered medications for this visit.   No results found.  Review of Systems:   A ROS was performed including pertinent positives and negatives as documented in the HPI.  Physical Exam :   Constitutional: NAD and appears stated age Neurological: Alert and oriented Psych: Appropriate affect and cooperative There were no vitals taken for this visit.   Comprehensive Musculoskeletal Exam:      Musculoskeletal Exam  Gait Normal   Alignment Valgus alignment of the left   Right Left  Inspection Normal Normal  Palpation    Tenderness None Lateral joint  Crepitus None None  Effusion None None  Range of Motion    Extension 0 0  Flexion 0135 135  Strength    Extension 5/5 5/5  Flexion 5/5 5/5  Ligament Exam     Generalized Laxity No No  Lachman Negative Negative   Pivot Shift Negative Negative  Anterior Drawer Negative Negative  Valgus at 0 Negative Negative  Valgus at 20 Negative Negative  Varus at 0 0 0  Varus at 20   0 0  Posterior Drawer at 90 0 0  Vascular/Lymphatic Exam    Edema None None  Venous Stasis Changes No No  Distal Circulation Normal Normal  Neurologic    Light Touch Sensation Intact Intact  Special Tests:      Imaging:   Xray (4 views left knee): Predominantly lateral Tibiofemoral osteoarthritis with marginal osteophytes.  This is moderate in nature   I personally reviewed and interpreted the radiographs.   Assessment:   72 y.o. female with a left knee lateral compartment osteoarthritis.  I described that this is likely contributing to her valgus gait.  I did describe the treatment options including physical therapy for hip girdle and leg strengthening program.  Ultimately I do believe that this would give her the best opportunity to get prolonged longevity out of the knee.  We did also discuss the role for injectables.  At this time she is deferring steroid due to a history of a reaction with this.  She is considering hyaluronic acid versus PRP injections.  She would like to consider this more.  I do also believe that a arthritis brace could give her significant relief as well.  We will plan to proceed with this for recurrent giving out and pain in the left knee  Plan :    -She will return to clinic as needed for an injection if physical therapy is not improving soon enough.  She is considering hyaluronic acid     I personally saw and evaluated the patient, and participated in  the management and treatment plan.  Vanetta Mulders, MD Attending Physician, Orthopedic Surgery  This document was dictated using Dragon voice recognition software. A reasonable attempt at proof reading has been made to minimize errors.

## 2022-05-10 MED ORDER — LEVOTHYROXINE SODIUM 125 MCG PO TABS: 125.0000 ug | ORAL_TABLET | Freq: Every day | ORAL | 3 refills | Status: DC

## 2022-05-16 ENCOUNTER — Encounter (HOSPITAL_BASED_OUTPATIENT_CLINIC_OR_DEPARTMENT_OTHER): Payer: Self-pay | Admitting: Physical Therapy

## 2022-05-16 ENCOUNTER — Ambulatory Visit (HOSPITAL_BASED_OUTPATIENT_CLINIC_OR_DEPARTMENT_OTHER): Payer: Medicare PPO | Admitting: Physical Therapy

## 2022-05-16 DIAGNOSIS — R2689 Other abnormalities of gait and mobility: Secondary | ICD-10-CM

## 2022-05-16 DIAGNOSIS — M6281 Muscle weakness (generalized): Secondary | ICD-10-CM

## 2022-05-16 DIAGNOSIS — M1712 Unilateral primary osteoarthritis, left knee: Secondary | ICD-10-CM | POA: Diagnosis not present

## 2022-05-16 DIAGNOSIS — G8929 Other chronic pain: Secondary | ICD-10-CM

## 2022-05-16 DIAGNOSIS — M25662 Stiffness of left knee, not elsewhere classified: Secondary | ICD-10-CM

## 2022-05-16 NOTE — Therapy (Signed)
OUTPATIENT PHYSICAL THERAPY TREATMENT   Patient Name: Jaime Rose MRN: [id number] DOB:[birth year], 72 y.o., female Today's Date: 05/16/2022  END OF SESSION:  PT End of Session - 05/16/22 1514     Visit Number 2    Number of Visits 16    Date for PT Re-Evaluation 07/04/22    Authorization Type [employer]    PT Start Time 1515    PT Stop Time 1600    PT Time Calculation (min) 45 min    Activity Tolerance Patient tolerated treatment well    Behavior During Therapy WFL for tasks assessed/performed              Past Medical History:  Diagnosis Date   ALLERGIC RHINITIS    CHICKENPOX, HX OF 12/21/2009   Qualifier: Diagnosis of  By: Asa Lente MD, Valerie A    Hyperthyroidism    normal TFTs 11/2009 , dx 10/2011   Varicose vein    s/p sclerosis tx summer 2013   Past Surgical History:  Procedure Laterality Date   ENDOVENOUS ABLATION SAPHENOUS VEIN W/ LASER  04-11-2012   endovenous laser ablation right greater saphenous vein and stab phlebectomy  right leg  by Curt Jews MD   Patient Active Problem List   Diagnosis Date Noted   Foot pain, left 06/02/2021   Ptosis of right eyelid 04/12/2020   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 12/29/2018   Osteopenia 01/10/2017   Right carotid bruit 12/21/2016   Prediabetes 12/21/2016   Hypothyroidism 03/20/2012   Varicose veins of lower extremities with other complications 62/95/2841   ALLERGIC RHINITIS 12/21/2009   Cardiac murmur 12/21/2009    PCP: Billey Gosling  REFERRING PROVIDER: Vanetta Mulders, MD  REFERRING DIAG: M17.12 (ICD-10-CM) - Unilateral primary osteoarthritis, left knee  THERAPY DIAG:  Chronic pain of left knee  Stiffness of left knee, not elsewhere classified  Muscle weakness (generalized)  Other abnormalities of gait and mobility  Rationale for Evaluation and Treatment: Rehabilitation  ONSET DATE: Multiple years  SUBJECTIVE:   SUBJECTIVE STATEMENT: Pt reports she did try the app but didn't like using  it. No issues with the exercises. Pt does note a little bit soreness.    PERTINENT HISTORY: From PT order: Possible aquatic candidate  Hip girdle strengthening and LE strengthening for OA  From eval: Pt states her knee has been a problem for years. Pt notes that knee was exacerbated in Feb when her foot was swollen (cause is unknown). Pt points to pain primarily along the lateral knee. Pt states it's not excruciating but if she exerts herself she can feel the pain (I.e. going to [place] and going up/down the stairs and hills). Pt with plans to go to [place] in April. It does get stiff over night. Pt states she is doing HEP for her foot/ankle that she was given by PT.   PAIN:  Are you having pain? Yes: NPRS scale: 0 currently,  5 at worst/10 Pain location: L lateral knee pain Pain description: Dull/achy Aggravating factors: Knee bending, descending stairs Relieving factors: Ibuprofen,   PRECAUTIONS: None  WEIGHT BEARING RESTRICTIONS: No  FALLS:  Has patient fallen in last 6 months? No  LIVING ENVIRONMENT: Lives with: lives alone Lives in: House/apartment Stairs: Yes: Internal: 14-15 steps; can reach both and External: 4-5 steps; can reach both Has following equipment at home: None  OCCUPATION: Nutritionist  PATIENT GOALS: Decrease the "waddle", go up/down stairs   NEXT MD VISIT:   OBJECTIVE:   DIAGNOSTIC FINDINGS: x-ray performed 05/09/22 --  results pending.  PATIENT SURVEYS:  [name] 65; predicted 73  MUSCLE LENGTH: Hamstrings: Right 80 deg; Left <60 deg (likely due to reduced knee ROM) Thomas test: WFL  POSTURE:   Increased knee valgus with Q angle >20 deg on L (greater in standing than supine). Patellar malalignment also noted -- appears slightly lateral to center in supine. R iliac crest higher than L in standing  PALPATION: TTP L lateral knee and glute med. Hypomobile patellar mobility  LOWER EXTREMITY ROM:  Active ROM Right eval Left eval  Knee flexion 120 100   Knee extension 0 -8   (Blank rows = not tested)  LOWER EXTREMITY MMT:  MMT Right eval Left eval  Hip flexion 4+ 4-  Hip extension 3+ 3  Hip abduction 4- 3  Hip adduction    Hip internal rotation    Hip external rotation    Knee flexion 5 5  Knee extension 5 5 (at 10 deg knee flex)  Ankle dorsiflexion    Ankle plantarflexion    Ankle inversion    Ankle eversion     (Blank rows = not tested)  LOWER EXTREMITY SPECIAL TESTS:  Hip special tests: Luisa Hart (FABER) test: negative, Trendelenburg test: positive , Ober's test: positive , and Ely's test: negative  FUNCTIONAL TESTS:  SLS: 5 sec R, 1 sec L Squatting demos increased L knee valgus, R weight shift, decreased knee flexion to ~60 deg -- pt compensates into a lunge if she has to squat lower Stair descent demos poor eccentric quad strength with increased valgus  GAIT: Distance walked: 100' Assistive device utilized: None Level of assistance: Complete Independence Comments: L foot externally rotated, knee slightly flexed during stance, antalgic, compensated trendelenburg with trunk lean during stance phase   [facility] Adult PT Treatment:                                                DATE: 05/16/22 Therapeutic Exercise: Nustep L5 x 5 min UEs/LEs Sitting Hamstring stretch 2x30 sec Quad set 2x10x3 sec SLR x10 SLR + ER x10 Supine ITB stretch with strap x 30 sec Bridging 2x10x3 sec Heel slide 2x10x3 sec Marching 2x10 Knee to chest on L 2x30 sec with hamstring curl but also uncomfortable for pt Sidelying Clamshell red TB 2x10 Attempted quadruped hip/knee flexion but too much for pt Manual Therapy: Grade II to III inferior and lateral hip mobilization                                                                                                                              DATE: 05/09/22 See HEP  Has been doing the following from prior PT exercises: SLR (has extension lag) Ankle pumps Ankle ABCs Heel slides with  strap  PATIENT EDUCATION:  Education details: Findings, initial HEP, POC Person educated: Patient Education  method: Explanation, Demonstration, and Handouts Education comprehension: verbalized understanding, returned demonstration, and needs further education  HOME EXERCISE PROGRAM: Access Code: [email or web address] URL: [email or web address] Date: 05/16/2022 Prepared by: Estill Bamberg April Thurnell Garbe  Exercises - Seated Hamstring Stretch  - 2 x daily - 7 x weekly - 2 sets - 30 sec hold - Seated Quad Set  - 2 x daily - 7 x weekly - 2 sets - 10 reps - 3 sec hold - Supine ITB Stretch with Strap  - 1 x daily - 7 x weekly - 2 sets - 30 sec hold - Clamshell with Resistance  - 1 x daily - 7 x weekly - 2 sets - 10 reps - Supine Heel Slide with Strap  - 1 x daily - 7 x weekly - 2 sets - 10 reps - Seated Straight Leg Raise with Support  - 1 x daily - 7 x weekly - 2 sets - 10 reps - Supine March  - 1 x daily - 7 x weekly - 2 sets - 10 reps - Supine Bridge  - 1 x daily - 7 x weekly - 2 sets - 10 reps - 3 sec hold  ASSESSMENT:  CLINICAL IMPRESSION: Reviewed HEP. Worked on improving knee ROM -- found hip joint hypomobility seems to also be affecting supine knee ROM. Improved posterior knee pain with heel slides after hip mobilization. Continued to progress strengthening as tolerated.  OBJECTIVE IMPAIRMENTS: Abnormal gait, decreased balance, decreased endurance, decreased mobility, difficulty walking, decreased ROM, decreased strength, hypomobility, increased fascial restrictions, impaired flexibility, postural dysfunction, and pain.    GOALS: Goals reviewed with patient? Yes  SHORT TERM GOALS: Target date: 06/07/2022  Pt will be ind with initial HEP Baseline: Goal status: INITIAL  2.  Pt will demo L knee ROM >/= 5-105 deg for improved stair ascent/descent Baseline:  Goal status: INITIAL    LONG TERM GOALS: Target date: 07/05/2022   Pt will be ind with progression and advancement of  HEP Baseline:  Goal status: INITIAL  2.  Pt will demo L knee ROM of >/= 5 - 115 deg for improved stair/hill ascent/descent Baseline:  Goal status: INITIAL  3.  Pt will be able to demo R & L SLS of >/=10 sec to show improved bilat LE stability Baseline: <5 sec Goal status: INITIAL  4.  Pt will be able to squat at least 20# lower than 70 deg of knee flexion to demo improved functional LE strength for lifting and carrying Baseline: Currently performs with compensation and pain Goal status: INITIAL  5.  Pt will demo normal reciprocal gait pattern without compensated trunk lean x1000' for community mobility Baseline:  Goal status: INITIAL  6.  Pt will have improved [name] score to >/=73 Baseline:  Goal status: INITIAL   PLAN:  PT FREQUENCY: 2x/week  PT DURATION: 8 weeks  PLANNED INTERVENTIONS: Therapeutic exercises, Therapeutic activity, Neuromuscular re-education, Balance training, Gait training, Patient/Family education, Self Care, Joint mobilization, Stair training, Orthotic/Fit training, Aquatic Therapy, Dry Needling, Electrical stimulation, Cryotherapy, Moist heat, Taping, Vasopneumatic device, Ultrasound, Ionotophoresis 4mg /ml Dexamethasone, Manual therapy, and Re-evaluation  PLAN FOR NEXT SESSION: Assess response to HEP. Work on knee ROM. Quad and glute stretching and strengthening. Progress to closed chain exercises as tolerated (I.e. sit<>stand with band around knees).    Juwan Vences April Ma L Chanteria Haggard, PT 05/16/2022, 3:14 PM

## 2022-05-23 ENCOUNTER — Ambulatory Visit (HOSPITAL_BASED_OUTPATIENT_CLINIC_OR_DEPARTMENT_OTHER): Payer: Medicare PPO | Admitting: Physical Therapy

## 2022-05-23 ENCOUNTER — Encounter (HOSPITAL_BASED_OUTPATIENT_CLINIC_OR_DEPARTMENT_OTHER): Payer: Self-pay | Admitting: Physical Therapy

## 2022-05-23 DIAGNOSIS — M1712 Unilateral primary osteoarthritis, left knee: Secondary | ICD-10-CM | POA: Diagnosis not present

## 2022-05-23 DIAGNOSIS — R2689 Other abnormalities of gait and mobility: Secondary | ICD-10-CM

## 2022-05-23 DIAGNOSIS — G8929 Other chronic pain: Secondary | ICD-10-CM

## 2022-05-23 DIAGNOSIS — M25662 Stiffness of left knee, not elsewhere classified: Secondary | ICD-10-CM

## 2022-05-23 DIAGNOSIS — M6281 Muscle weakness (generalized): Secondary | ICD-10-CM

## 2022-05-23 NOTE — Therapy (Signed)
OUTPATIENT PHYSICAL THERAPY TREATMENT   Patient Name: Jaime Rose MRN: [id number] DOB:1950/09/12, 72 y.o., female Today's Date: 05/23/2022  END OF SESSION:  PT End of Session - 05/23/22 1521     Visit Number 3    Number of Visits 16    Date for PT Re-Evaluation 07/04/22    Authorization Type [employer]    PT Start Time 1518    PT Stop Time 1600    PT Time Calculation (min) 42 min    Activity Tolerance Patient tolerated treatment well    Behavior During Therapy WFL for tasks assessed/performed               Past Medical History:  Diagnosis Date   ALLERGIC RHINITIS    CHICKENPOX, HX OF 12/21/2009   Qualifier: Diagnosis of  By: Felicity Coyer MD, Valerie A    Hyperthyroidism    normal TFTs 11/2009 , dx 10/2011   Varicose vein    s/p sclerosis tx summer 2013   Past Surgical History:  Procedure Laterality Date   ENDOVENOUS ABLATION SAPHENOUS VEIN W/ LASER  04-11-2012   endovenous laser ablation right greater saphenous vein and stab phlebectomy  right leg  by Gretta Began MD   Patient Active Problem List   Diagnosis Date Noted   Foot pain, left 06/02/2021   Ptosis of right eyelid 04/12/2020   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 12/29/2018   Osteopenia 01/10/2017   Right carotid bruit 12/21/2016   Prediabetes 12/21/2016   Hypothyroidism 03/20/2012   Varicose veins of lower extremities with other complications 12/21/2011   ALLERGIC RHINITIS 12/21/2009   Cardiac murmur 12/21/2009    PCP: Cheryll Cockayne  REFERRING PROVIDER: Huel Cote, MD  REFERRING DIAG: [phone] (ICD-10-CM) - Unilateral primary osteoarthritis, left knee  THERAPY DIAG:  Chronic pain of left knee  Stiffness of left knee, not elsewhere classified  Muscle weakness (generalized)  Other abnormalities of gait and mobility  Rationale for Evaluation and Treatment: Rehabilitation  ONSET DATE: Multiple years  SUBJECTIVE:   SUBJECTIVE STATEMENT: Pt states she can tell she's getting stronger. Pain  has not yet changed.   PERTINENT HISTORY: From PT order: Possible aquatic candidate  Hip girdle strengthening and LE strengthening for OA  From eval: Pt states her knee has been a problem for years. Pt notes that knee was exacerbated in Feb when her foot was swollen (cause is unknown). Pt points to pain primarily along the lateral knee. Pt states it's not excruciating but if she exerts herself she can feel the pain (I.e. going to [place] and going up/down the stairs and hills). Pt with plans to go to [place] in April. It does get stiff over night. Pt states she is doing HEP for her foot/ankle that she was given by PT.   PAIN:  Are you having pain? Yes: NPRS scale: 0 currently,  5 at worst/10 Pain location: L lateral knee pain Pain description: Dull/achy Aggravating factors: Knee bending, descending stairs Relieving factors: Ibuprofen,   PRECAUTIONS: None  WEIGHT BEARING RESTRICTIONS: No  FALLS:  Has patient fallen in last 6 months? No  LIVING ENVIRONMENT: Lives with: lives alone Lives in: House/apartment Stairs: Yes: Internal: 14-15 steps; can reach both and External: 4-5 steps; can reach both Has following equipment at home: None  OCCUPATION: Nutritionist  PATIENT GOALS: Decrease the "waddle", go up/down stairs   NEXT MD VISIT:   OBJECTIVE:   DIAGNOSTIC FINDINGS: x-ray performed 05/09/22 -- results pending.  PATIENT SURVEYS:  [name] 65; predicted 7  MUSCLE LENGTH: Hamstrings: Right 80 deg; Left <60 deg (likely due to reduced knee ROM) Thomas test: WFL  POSTURE:   Increased knee valgus with Q angle >20 deg on L (greater in standing than supine). Patellar malalignment also noted -- appears slightly lateral to center in supine. R iliac crest higher than L in standing  PALPATION: TTP L lateral knee and glute med. Hypomobile patellar mobility  LOWER EXTREMITY ROM:  Active ROM Right eval Left eval  Knee flexion 120 100  Knee extension 0 -8   (Blank rows = not  tested)  LOWER EXTREMITY MMT:  MMT Right eval Left eval  Hip flexion 4+ 4-  Hip extension 3+ 3  Hip abduction 4- 3  Hip adduction    Hip internal rotation    Hip external rotation    Knee flexion 5 5  Knee extension 5 5 (at 10 deg knee flex)  Ankle dorsiflexion    Ankle plantarflexion    Ankle inversion    Ankle eversion     (Blank rows = not tested)  LOWER EXTREMITY SPECIAL TESTS:  Hip special tests: [name] (FABER) test: negative, Trendelenburg test: positive , Ober's test: positive , and Ely's test: negative  FUNCTIONAL TESTS:  SLS: 5 sec R, 1 sec L Squatting demos increased L knee valgus, R weight shift, decreased knee flexion to ~60 deg -- pt compensates into a lunge if she has to squat lower Stair descent demos poor eccentric quad strength with increased valgus  GAIT: Distance walked: 100' Assistive device utilized: None Level of assistance: Complete Independence Comments: L foot externally rotated, knee slightly flexed during stance, antalgic, compensated trendelenburg with trunk lean during stance phase  [facility] Adult PT Treatment:                                                DATE: 05/23/22 Therapeutic Exercise: Nustep L4 x 5 min LEs only Reviewed HEP Marching with ab set 2x10 ITB stretch 2x30 sec Sit<>stand red TB around knees 2x10 Side step red TB around knees 3x15' Standing ITB stretch x30 sec Captain Morgan's 10x5 sec Manual Therapy: IASTM with massage roller along ITB Gait training: Amb with heel strike decreased trunk lean, glute and quad setting with stance phase   [facility] Adult PT Treatment:                                                DATE: 05/16/22 Therapeutic Exercise: Nustep L5 x 5 min UEs/LEs Sitting Hamstring stretch 2x30 sec Quad set 2x10x3 sec SLR x10 SLR + ER x10 Supine ITB stretch with strap x 30 sec Bridging 2x10x3 sec Heel slide 2x10x3 sec Marching 2x10 Knee to chest on L 2x30 sec with hamstring curl but also uncomfortable for  pt Sidelying Clamshell red TB 2x10 Attempted quadruped hip/knee flexion but too much for pt Manual Therapy: Grade II to III inferior and lateral hip mobilization  DATE: 05/09/22 See HEP  Has been doing the following from prior PT exercises: SLR (has extension lag) Ankle pumps Ankle ABCs Heel slides with strap  PATIENT EDUCATION:  Education details: Findings, initial HEP, POC Person educated: Patient Education method: Explanation, Demonstration, and Handouts Education comprehension: verbalized understanding, returned demonstration, and needs further education  HOME EXERCISE PROGRAM: Access Code: [email or web address] URL: [email or web address] Date: 05/16/2022 Prepared by: Vernon Prey April Kirstie Peri  Exercises - Seated Hamstring Stretch  - 2 x daily - 7 x weekly - 2 sets - 30 sec hold - Seated Quad Set  - 2 x daily - 7 x weekly - 2 sets - 10 reps - 3 sec hold - Supine ITB Stretch with Strap  - 1 x daily - 7 x weekly - 2 sets - 30 sec hold - Clamshell with Resistance  - 1 x daily - 7 x weekly - 2 sets - 10 reps - Supine Heel Slide with Strap  - 1 x daily - 7 x weekly - 2 sets - 10 reps - Seated Straight Leg Raise with Support  - 1 x daily - 7 x weekly - 2 sets - 10 reps - Supine March  - 1 x daily - 7 x weekly - 2 sets - 10 reps - Supine Bridge  - 1 x daily - 7 x weekly - 2 sets - 10 reps - 3 sec hold  ASSESSMENT:  CLINICAL IMPRESSION: Heel slides with some lateral knee pain today -- improved after stretching ITB and doing manual work for ITB. Treatment session focused on standing stability and abductor strength. Worked on gait without trendelenburg pattern.   OBJECTIVE IMPAIRMENTS: Abnormal gait, decreased balance, decreased endurance, decreased mobility, difficulty walking, decreased ROM, decreased strength, hypomobility, increased fascial restrictions,  impaired flexibility, postural dysfunction, and pain.    GOALS: Goals reviewed with patient? Yes  SHORT TERM GOALS: Target date: 06/07/2022  Pt will be ind with initial HEP Baseline: Goal status: INITIAL  2.  Pt will demo L knee ROM >/= 5-105 deg for improved stair ascent/descent Baseline:  Goal status: INITIAL    LONG TERM GOALS: Target date: 07/05/2022   Pt will be ind with progression and advancement of HEP Baseline:  Goal status: INITIAL  2.  Pt will demo L knee ROM of >/= 5 - 115 deg for improved stair/hill ascent/descent Baseline:  Goal status: INITIAL  3.  Pt will be able to demo R & L SLS of >/=10 sec to show improved bilat LE stability Baseline: <5 sec Goal status: INITIAL  4.  Pt will be able to squat at least 20# lower than 70 deg of knee flexion to demo improved functional LE strength for lifting and carrying Baseline: Currently performs with compensation and pain Goal status: INITIAL  5.  Pt will demo normal reciprocal gait pattern without compensated trunk lean x1000' for community mobility Baseline:  Goal status: INITIAL  6.  Pt will have improved [name] score to >/=73 Baseline:  Goal status: INITIAL   PLAN:  PT FREQUENCY: 2x/week  PT DURATION: 8 weeks  PLANNED INTERVENTIONS: Therapeutic exercises, Therapeutic activity, Neuromuscular re-education, Balance training, Gait training, Patient/Family education, Self Care, Joint mobilization, Stair training, Orthotic/Fit training, Aquatic Therapy, Dry Needling, Electrical stimulation, Cryotherapy, Moist heat, Taping, Vasopneumatic device, Ultrasound, Ionotophoresis 4mg /ml Dexamethasone, Manual therapy, and Re-evaluation  PLAN FOR NEXT SESSION: Assess response to HEP. Work on knee ROM. Quad and glute stretching and strengthening. Progress to closed chain exercises as tolerated (I.e. sit<>stand with band around  knees).    Godson Pollan April Ma L Raymond Bhardwaj, PT 05/23/2022, 3:22 PM

## 2022-05-25 ENCOUNTER — Encounter (HOSPITAL_BASED_OUTPATIENT_CLINIC_OR_DEPARTMENT_OTHER): Payer: Self-pay | Admitting: Physical Therapy

## 2022-05-25 ENCOUNTER — Ambulatory Visit (HOSPITAL_BASED_OUTPATIENT_CLINIC_OR_DEPARTMENT_OTHER): Payer: Medicare PPO | Admitting: Physical Therapy

## 2022-05-25 DIAGNOSIS — M1712 Unilateral primary osteoarthritis, left knee: Secondary | ICD-10-CM | POA: Diagnosis not present

## 2022-05-25 DIAGNOSIS — G8929 Other chronic pain: Secondary | ICD-10-CM

## 2022-05-25 DIAGNOSIS — M25662 Stiffness of left knee, not elsewhere classified: Secondary | ICD-10-CM

## 2022-05-25 DIAGNOSIS — R2689 Other abnormalities of gait and mobility: Secondary | ICD-10-CM

## 2022-05-25 DIAGNOSIS — M6281 Muscle weakness (generalized): Secondary | ICD-10-CM

## 2022-05-25 NOTE — Therapy (Signed)
OUTPATIENT PHYSICAL THERAPY TREATMENT   Patient Name: Jaime Rose MRN: [id number] DOB:1950-08-13, 72 y.o., female Today's Date: 05/25/2022  END OF SESSION:  PT End of Session - 05/25/22 1515     Visit Number 4    Number of Visits 16    Date for PT Re-Evaluation 07/04/22    Authorization Type [employer]    PT Start Time 0254    PT Stop Time 1555    PT Time Calculation (min) 39 min    Activity Tolerance Patient tolerated treatment well    Behavior During Therapy WFL for tasks assessed/performed               Past Medical History:  Diagnosis Date   ALLERGIC RHINITIS    CHICKENPOX, HX OF 12/21/2009   Qualifier: Diagnosis of  By: Asa Lente MD, Valerie A    Hyperthyroidism    normal TFTs 11/2009 , dx 10/2011   Varicose vein    s/p sclerosis tx summer 2013   Past Surgical History:  Procedure Laterality Date   ENDOVENOUS ABLATION SAPHENOUS VEIN W/ LASER  04-11-2012   endovenous laser ablation right greater saphenous vein and stab phlebectomy  right leg  by Curt Jews MD   Patient Active Problem List   Diagnosis Date Noted   Foot pain, left 06/02/2021   Ptosis of right eyelid 04/12/2020   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 12/29/2018   Osteopenia 01/10/2017   Right carotid bruit 12/21/2016   Prediabetes 12/21/2016   Hypothyroidism 03/20/2012   Varicose veins of lower extremities with other complications 27/09/2374   ALLERGIC RHINITIS 12/21/2009   Cardiac murmur 12/21/2009    PCP: Billey Gosling  REFERRING PROVIDER: Vanetta Mulders, MD  REFERRING DIAG: M17.12 (ICD-10-CM) - Unilateral primary osteoarthritis, left knee  THERAPY DIAG:  Chronic pain of left knee  Stiffness of left knee, not elsewhere classified  Muscle weakness (generalized)  Other abnormalities of gait and mobility  Rationale for Evaluation and Treatment: Rehabilitation  ONSET DATE: Multiple years  SUBJECTIVE:   SUBJECTIVE STATEMENT: "PT was pretty intense on Wednesday.  I didn't  think I was going to walk on Thursday, but it was the best my knee has felt. "  PERTINENT HISTORY: From PT order: Possible aquatic candidate  Hip girdle strengthening and LE strengthening for OA  From eval: Pt states her knee has been a problem for years. Pt notes that knee was exacerbated in Feb when her foot was swollen (cause is unknown). Pt points to pain primarily along the lateral knee. Pt states it's not excruciating but if she exerts herself she can feel the pain (I.e. going to [place] and going up/down the stairs and hills). Pt with plans to go to [place] in April. It does get stiff over night. Pt states she is doing HEP for her foot/ankle that she was given by PT.   PAIN:  Are you having pain? no: NPRS scale: 0/10, "just an awareness"  Pain location: L lateral knee pain Pain description: Dull/achy Aggravating factors: Knee bending, descending stairs Relieving factors: Ibuprofen,   PRECAUTIONS: None  WEIGHT BEARING RESTRICTIONS: No  FALLS:  Has patient fallen in last 6 months? No  LIVING ENVIRONMENT: Lives with: lives alone Lives in: House/apartment Stairs: Yes: Internal: 14-15 steps; can reach both and External: 4-5 steps; can reach both Has following equipment at home: None  OCCUPATION: Nutritionist  PATIENT GOALS: Decrease the "waddle", go up/down stairs   NEXT MD VISIT:   OBJECTIVE:   DIAGNOSTIC FINDINGS: x-ray performed  05/09/22 -- results pending.  PATIENT SURVEYS:  [name] 65; predicted 73  MUSCLE LENGTH: Hamstrings: Right 80 deg; Left <60 deg (likely due to reduced knee ROM) Thomas test: WFL  POSTURE:   Increased knee valgus with Q angle >20 deg on L (greater in standing than supine). Patellar malalignment also noted -- appears slightly lateral to center in supine. R iliac crest higher than L in standing  PALPATION: TTP L lateral knee and glute med. Hypomobile patellar mobility  LOWER EXTREMITY ROM:  Active ROM Right eval Left eval  Knee flexion 120  100  Knee extension 0 -8   (Blank rows = not tested)  LOWER EXTREMITY MMT:  MMT Right eval Left eval  Hip flexion 4+ 4-  Hip extension 3+ 3  Hip abduction 4- 3  Hip adduction    Hip internal rotation    Hip external rotation    Knee flexion 5 5  Knee extension 5 5 (at 10 deg knee flex)  Ankle dorsiflexion    Ankle plantarflexion    Ankle inversion    Ankle eversion     (Blank rows = not tested)  LOWER EXTREMITY SPECIAL TESTS:  Hip special tests: Luisa Hart (FABER) test: negative, Trendelenburg test: positive , Ober's test: positive , and Ely's test: negative  FUNCTIONAL TESTS:  SLS: 5 sec R, 1 sec L Squatting demos increased L knee valgus, R weight shift, decreased knee flexion to ~60 deg -- pt compensates into a lunge if she has to squat lower Stair descent demos poor eccentric quad strength with increased valgus  GAIT: Distance walked: 100' Assistive device utilized: None Level of assistance: Complete Independence Comments: L foot externally rotated, knee slightly flexed during stance, antalgic, compensated trendelenburg with trunk lean during stance phase  [facility] Adult PT Treatment:                                                DATE: 05/25/22 Pt seen for aquatic therapy today.  Treatment took place in water 3.25-4.5 ft in depth at the [facility] pool. Temp of water was 91.  Pt entered/exited the pool via stairs independently with bilat rail. * gait unsupported:  forward/ backward focused on heel strike, toe off and allowing Lt knee to flex during swing through *  side stepping with hydro band above knees - adjusting step length and speed * TrA set with gentle press into yellow noodle x 5 sec hold x 10; then with added slow high knee marching * Holding wall : single leg clams (with limited height of foot on leg); curtsy lunges;  * standing with foot on bench in water: hamstring stretch x 20s; seated on bench -hamstring stretch x 20s  Pt requires the buoyancy and  hydrostatic pressure of water for support, and to offload joints by unweighting joint load by at least 50 % in navel deep water and by at least 75-80% in chest to neck deep water.  Viscosity of the water is needed for resistance of strengthening. Water current perturbations provides challenge to standing balance requiring increased core activation.    [facility] Adult PT Treatment:  DATE: 05/23/22 Therapeutic Exercise: Nustep L4 x 5 min LEs only Reviewed HEP Marching with ab set 2x10 ITB stretch 2x30 sec Sit<>stand red TB around knees 2x10 Side step red TB around knees 3x15' Standing ITB stretch x30 sec Captain Morgan's 10x5 sec Manual Therapy: IASTM with massage roller along ITB Gait training: Amb with heel strike decreased trunk lean, glute and quad setting with stance phase   [facility] Adult PT Treatment:                                                DATE: 05/16/22 Therapeutic Exercise: Nustep L5 x 5 min UEs/LEs Sitting Hamstring stretch 2x30 sec Quad set 2x10x3 sec SLR x10 SLR + ER x10 Supine ITB stretch with strap x 30 sec Bridging 2x10x3 sec Heel slide 2x10x3 sec Marching 2x10 Knee to chest on L 2x30 sec with hamstring curl but also uncomfortable for pt Sidelying Clamshell red TB 2x10 Attempted quadruped hip/knee flexion but too much for pt Manual Therapy: Grade II to III inferior and lateral hip mobilization                                                                                                                              DATE: 05/09/22 See HEP  Has been doing the following from prior PT exercises: SLR (has extension lag) Ankle pumps Ankle ABCs Heel slides with strap  PATIENT EDUCATION:  Education details: Findings, initial HEP, POC Person educated: Patient Education method: Explanation, Demonstration, and Handouts Education comprehension: verbalized understanding, returned demonstration, and needs further  education  HOME EXERCISE PROGRAM: Access Code: [email or web address] URL: [email or web address] Date: 05/16/2022 Prepared by: Vernon Prey April Kirstie Peri  Exercises - Seated Hamstring Stretch  - 2 x daily - 7 x weekly - 2 sets - 30 sec hold - Seated Quad Set  - 2 x daily - 7 x weekly - 2 sets - 10 reps - 3 sec hold - Supine ITB Stretch with Strap  - 1 x daily - 7 x weekly - 2 sets - 30 sec hold - Clamshell with Resistance  - 1 x daily - 7 x weekly - 2 sets - 10 reps - Supine Heel Slide with Strap  - 1 x daily - 7 x weekly - 2 sets - 10 reps - Seated Straight Leg Raise with Support  - 1 x daily - 7 x weekly - 2 sets - 10 reps - Supine March  - 1 x daily - 7 x weekly - 2 sets - 10 reps - Supine Bridge  - 1 x daily - 7 x weekly - 2 sets - 10 reps - 3 sec hold  ASSESSMENT:  CLINICAL IMPRESSION: Pt is independent in aquatic setting and able to take direction from therapist on deck.  Tolerated all exercises with no increase in pain during  session.  She was mindful of Lt foot position with placement with gait (neutral vs turned out).  Verbally added standing/seated hamstring stretch to HEP (suggested when she is sitting at her desk).  Making progress towards established goals.   OBJECTIVE IMPAIRMENTS: Abnormal gait, decreased balance, decreased endurance, decreased mobility, difficulty walking, decreased ROM, decreased strength, hypomobility, increased fascial restrictions, impaired flexibility, postural dysfunction, and pain.    GOALS: Goals reviewed with patient? Yes  SHORT TERM GOALS: Target date: 06/07/2022  Pt will be ind with initial HEP Baseline: Goal status: INITIAL  2.  Pt will demo L knee ROM >/= 5-105 deg for improved stair ascent/descent Baseline:  Goal status: INITIAL    LONG TERM GOALS: Target date: 07/05/2022   Pt will be ind with progression and advancement of HEP Baseline:  Goal status: INITIAL  2.  Pt will demo L knee ROM of >/= 5 - 115 deg for improved  stair/hill ascent/descent Baseline:  Goal status: INITIAL  3.  Pt will be able to demo R & L SLS of >/=10 sec to show improved bilat LE stability Baseline: <5 sec Goal status: INITIAL  4.  Pt will be able to squat at least 20# lower than 70 deg of knee flexion to demo improved functional LE strength for lifting and carrying Baseline: Currently performs with compensation and pain Goal status: INITIAL  5.  Pt will demo normal reciprocal gait pattern without compensated trunk lean x1000' for community mobility Baseline:  Goal status: INITIAL  6.  Pt will have improved [name] score to >/=73 Baseline:  Goal status: INITIAL   PLAN:  PT FREQUENCY: 2x/week  PT DURATION: 8 weeks  PLANNED INTERVENTIONS: Therapeutic exercises, Therapeutic activity, Neuromuscular re-education, Balance training, Gait training, Patient/Family education, Self Care, Joint mobilization, Stair training, Orthotic/Fit training, Aquatic Therapy, Dry Needling, Electrical stimulation, Cryotherapy, Moist heat, Taping, Vasopneumatic device, Ultrasound, Ionotophoresis 4mg /ml Dexamethasone, Manual therapy, and Re-evaluation  PLAN FOR NEXT SESSION: Assess response to HEP. Work on knee ROM. Quad and glute stretching and strengthening. Progress to closed chain exercises as tolerated (I.e. sit<>stand with band around knees).   Kerin Perna, PTA 05/25/22 4:57 PM [facility] [address] [place], [place], [address] Phone: [phone]   Fax:  [phone]

## 2022-05-27 NOTE — Therapy (Signed)
OUTPATIENT PHYSICAL THERAPY TREATMENT   Patient Name: Jaime Rose MRN: [id number] DOB:11/10/1950, 72 y.o., female Today's Date: 05/28/2022  END OF SESSION:  PT End of Session - 05/28/22 1638     Number of Visits 16    Date for PT Re-Evaluation 07/04/22    Authorization Type [employer]    PT Start Time 1636    PT Stop Time 1715    PT Time Calculation (min) 39 min    Activity Tolerance Patient tolerated treatment well    Behavior During Therapy WFL for tasks assessed/performed               Past Medical History:  Diagnosis Date   ALLERGIC RHINITIS    CHICKENPOX, HX OF 12/21/2009   Qualifier: Diagnosis of  By: Asa Lente MD, Valerie A    Hyperthyroidism    normal TFTs 11/2009 , dx 10/2011   Varicose vein    s/p sclerosis tx summer 2013   Past Surgical History:  Procedure Laterality Date   ENDOVENOUS ABLATION SAPHENOUS VEIN W/ LASER  04-11-2012   endovenous laser ablation right greater saphenous vein and stab phlebectomy  right leg  by Curt Jews MD   Patient Active Problem List   Diagnosis Date Noted   Foot pain, left 06/02/2021   Ptosis of right eyelid 04/12/2020   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 12/29/2018   Osteopenia 01/10/2017   Right carotid bruit 12/21/2016   Prediabetes 12/21/2016   Hypothyroidism 03/20/2012   Varicose veins of lower extremities with other complications 34/19/6222   ALLERGIC RHINITIS 12/21/2009   Cardiac murmur 12/21/2009    PCP: Billey Gosling  REFERRING PROVIDER: Vanetta Mulders, MD  REFERRING DIAG: [phone] (ICD-10-CM) - Unilateral primary osteoarthritis, left knee  THERAPY DIAG:  Chronic pain of left knee  Stiffness of left knee, not elsewhere classified  Muscle weakness (generalized)  Other abnormalities of gait and mobility  Rationale for Evaluation and Treatment: Rehabilitation  ONSET DATE: Multiple years  SUBJECTIVE:   SUBJECTIVE STATEMENT: "Pt reports feeling good no pain just really stifness "  PERTINENT  HISTORY: From PT order: Possible aquatic candidate  Hip girdle strengthening and LE strengthening for OA  From eval: Pt states her knee has been a problem for years. Pt notes that knee was exacerbated in Feb when her foot was swollen (cause is unknown). Pt points to pain primarily along the lateral knee. Pt states it's not excruciating but if she exerts herself she can feel the pain (I.e. going to [place] and going up/down the stairs and hills). Pt with plans to go to [place] in April. It does get stiff over night. Pt states she is doing HEP for her foot/ankle that she was given by PT.   PAIN:  Are you having pain? no: NPRS scale: 0/10, "just an awareness/stiffnes"  Pain location: L lateral knee pain Pain description: Dull/achy Aggravating factors: Knee bending, descending stairs Relieving factors: Ibuprofen,   PRECAUTIONS: None  WEIGHT BEARING RESTRICTIONS: No  FALLS:  Has patient fallen in last 6 months? No  LIVING ENVIRONMENT: Lives with: lives alone Lives in: House/apartment Stairs: Yes: Internal: 14-15 steps; can reach both and External: 4-5 steps; can reach both Has following equipment at home: None  OCCUPATION: Nutritionist  PATIENT GOALS: Decrease the "waddle", go up/down stairs   NEXT MD VISIT:   OBJECTIVE:   DIAGNOSTIC FINDINGS: x-ray performed 05/09/22 -- results pending.  PATIENT SURVEYS:  [name] 65; predicted 73  MUSCLE LENGTH: Hamstrings: Right 80 deg; Left <60 deg (likely  due to reduced knee ROM) Marcello Moores test: WFL  POSTURE:   Increased knee valgus with Q angle >20 deg on L (greater in standing than supine). Patellar malalignment also noted -- appears slightly lateral to center in supine. R iliac crest higher than L in standing  PALPATION: TTP L lateral knee and glute med. Hypomobile patellar mobility  LOWER EXTREMITY ROM:  Active ROM Right eval Left eval  Knee flexion 120 100  Knee extension 0 -8   (Blank rows = not tested)  LOWER EXTREMITY  MMT:  MMT Right eval Left eval  Hip flexion 4+ 4-  Hip extension 3+ 3  Hip abduction 4- 3  Hip adduction    Hip internal rotation    Hip external rotation    Knee flexion 5 5  Knee extension 5 5 (at 10 deg knee flex)  Ankle dorsiflexion    Ankle plantarflexion    Ankle inversion    Ankle eversion     (Blank rows = not tested)  LOWER EXTREMITY SPECIAL TESTS:  Hip special tests: [name] (FABER) test: negative, Trendelenburg test: positive , Ober's test: positive , and Ely's test: negative  FUNCTIONAL TESTS:  SLS: 5 sec R, 1 sec L Squatting demos increased L knee valgus, R weight shift, decreased knee flexion to ~60 deg -- pt compensates into a lunge if she has to squat lower Stair descent demos poor eccentric quad strength with increased valgus  GAIT: Distance walked: 100' Assistive device utilized: None Level of assistance: Complete Independence Comments: L foot externally rotated, knee slightly flexed during stance, antalgic, compensated trendelenburg with trunk lean during stance phase   [facility] Adult PT Treatment:                                                DATE: 05/28/22 Pt seen for aquatic therapy today.  Treatment took place in water 3.25-4.5 ft in depth at the [facility]. Temp of water was 91.  Pt entered/exited the pool via stairs independently with bilat rail. * gait unsupported:  forward/ backward focused on heel strike, toe off and allowing Lt knee to flex during swing through *Forward and back amb with hand buoys submerged *side stepping yellow hand buoys submerged to side x 2 widths then side lunges with ue add/abd x 2 widths * TrA set : pushing solid blue noodle to knees Le staggered then wide stance x8 in each position * Holding solid blue noodle :1/2 diamonds, curtsy lunges *UE support on wall: minis-squats. Cues and demonstration for proper execution weight through heels  *standing leaning on wall hamstring, gastroc, adductor and IT band  stretch using noodle under ankle *solid noodle kick down  hip in neutral then ER x10 R/L   Pt requires the buoyancy and hydrostatic pressure of water for support, and to offload joints by unweighting joint load by at least 50 % in navel deep water and by at least 75-80% in chest to neck deep water.  Viscosity of the water is needed for resistance of strengthening. Water current perturbations provides challenge to standing balance requiring increased core activation.    [facility] Adult PT Treatment:  DATE: 05/25/22 Pt seen for aquatic therapy today.  Treatment took place in water 3.25-4.5 ft in depth at the [facility] pool. Temp of water was 91.  Pt entered/exited the pool via stairs independently with bilat rail. * gait unsupported:  forward/ backward focused on heel strike, toe off and allowing Lt knee to flex during swing through *  side stepping with hydro band above knees - adjusting step length and speed * TrA set with gentle press into yellow noodle x 5 sec hold x 10; then with added slow high knee marching * Holding wall : single leg clams (with limited height of foot on leg); curtsy lunges;  * standing with foot on bench in water: hamstring stretch x 20s; seated on bench -hamstring stretch x 20s  Pt requires the buoyancy and hydrostatic pressure of water for support, and to offload joints by unweighting joint load by at least 50 % in navel deep water and by at least 75-80% in chest to neck deep water.  Viscosity of the water is needed for resistance of strengthening. Water current perturbations provides challenge to standing balance requiring increased core activation.    [facility] Adult PT Treatment:                                                DATE: 05/23/22 Therapeutic Exercise: Nustep L4 x 5 min LEs only Reviewed HEP Marching with ab set 2x10 ITB stretch 2x30 sec Sit<>stand red TB around knees 2x10 Side step red TB around knees  3x15' Standing ITB stretch x30 sec Captain Morgan's 10x5 sec Manual Therapy: IASTM with massage roller along ITB Gait training: Amb with heel strike decreased trunk lean, glute and quad setting with stance phase   [facility] Adult PT Treatment:                                                DATE: 05/16/22 Therapeutic Exercise: Nustep L5 x 5 min UEs/LEs Sitting Hamstring stretch 2x30 sec Quad set 2x10x3 sec SLR x10 SLR + ER x10 Supine ITB stretch with strap x 30 sec Bridging 2x10x3 sec Heel slide 2x10x3 sec Marching 2x10 Knee to chest on L 2x30 sec with hamstring curl but also uncomfortable for pt Sidelying Clamshell red TB 2x10 Attempted quadruped hip/knee flexion but too much for pt Manual Therapy: Grade II to III inferior and lateral hip mobilization                                                                                                                              DATE: 05/09/22 See HEP  Has been doing the following from prior PT exercises: SLR (has extension lag) Ankle pumps Ankle  ABCs Heel slides with strap  PATIENT EDUCATION:  Education details: Findings, initial HEP, POC Person educated: Patient Education method: Explanation, Demonstration, and Handouts Education comprehension: verbalized understanding, returned demonstration, and needs further education  HOME EXERCISE PROGRAM: Access Code: [email or web address] URL: [email or web address] Date: 05/16/2022 Prepared by: Vernon Prey April Kirstie Peri  Exercises - Seated Hamstring Stretch  - 2 x daily - 7 x weekly - 2 sets - 30 sec hold - Seated Quad Set  - 2 x daily - 7 x weekly - 2 sets - 10 reps - 3 sec hold - Supine ITB Stretch with Strap  - 1 x daily - 7 x weekly - 2 sets - 30 sec hold - Clamshell with Resistance  - 1 x daily - 7 x weekly - 2 sets - 10 reps - Supine Heel Slide with Strap  - 1 x daily - 7 x weekly - 2 sets - 10 reps - Seated Straight Leg Raise with Support  - 1 x daily - 7 x weekly - 2 sets  - 10 reps - Supine March  - 1 x daily - 7 x weekly - 2 sets - 10 reps - Supine Bridge  - 1 x daily - 7 x weekly - 2 sets - 10 reps - 3 sec hold  ASSESSMENT:  CLINICAL IMPRESSION: Progressed stretching, strengthening and balance tasks.   She verbalizes effort to keep Lt foot at neutral with amb throughout session with good success. No pain before or after therapy.  She reports positive response to last few session.  Given vc and demonstration with added exercises today for proper execution.  Goals ongoing.  OBJECTIVE IMPAIRMENTS: Abnormal gait, decreased balance, decreased endurance, decreased mobility, difficulty walking, decreased ROM, decreased strength, hypomobility, increased fascial restrictions, impaired flexibility, postural dysfunction, and pain.    GOALS: Goals reviewed with patient? Yes  SHORT TERM GOALS: Target date: 06/07/2022  Pt will be ind with initial HEP Baseline: Goal status: INITIAL  2.  Pt will demo L knee ROM >/= 5-105 deg for improved stair ascent/descent Baseline:  Goal status: INITIAL    LONG TERM GOALS: Target date: 07/05/2022   Pt will be ind with progression and advancement of HEP Baseline:  Goal status: INITIAL  2.  Pt will demo L knee ROM of >/= 5 - 115 deg for improved stair/hill ascent/descent Baseline:  Goal status: INITIAL  3.  Pt will be able to demo R & L SLS of >/=10 sec to show improved bilat LE stability Baseline: <5 sec Goal status: INITIAL  4.  Pt will be able to squat at least 20# lower than 70 deg of knee flexion to demo improved functional LE strength for lifting and carrying Baseline: Currently performs with compensation and pain Goal status: INITIAL  5.  Pt will demo normal reciprocal gait pattern without compensated trunk lean x1000' for community mobility Baseline:  Goal status: INITIAL  6.  Pt will have improved [name] score to >/=73 Baseline:  Goal status: INITIAL   PLAN:  PT FREQUENCY: 2x/week  PT DURATION: 8  weeks  PLANNED INTERVENTIONS: Therapeutic exercises, Therapeutic activity, Neuromuscular re-education, Balance training, Gait training, Patient/Family education, Self Care, Joint mobilization, Stair training, Orthotic/Fit training, Aquatic Therapy, Dry Needling, Electrical stimulation, Cryotherapy, Moist heat, Taping, Vasopneumatic device, Ultrasound, Ionotophoresis 4mg /ml Dexamethasone, Manual therapy, and Re-evaluation  PLAN FOR NEXT SESSION: Assess response to HEP. Work on knee ROM. Quad and glute stretching and strengthening. Progress to closed chain exercises as tolerated (I.e. sit<>stand with band around knees).  [address] [place]) Arjen Deringer MPT 05/28/22 6:14 PM [facility] [address] [place], [place], [address] Phone: [phone]   Fax:  [phone]

## 2022-05-28 ENCOUNTER — Encounter (HOSPITAL_BASED_OUTPATIENT_CLINIC_OR_DEPARTMENT_OTHER): Payer: Self-pay | Admitting: Physical Therapy

## 2022-05-28 ENCOUNTER — Ambulatory Visit (HOSPITAL_BASED_OUTPATIENT_CLINIC_OR_DEPARTMENT_OTHER): Payer: Medicare PPO | Admitting: Physical Therapy

## 2022-05-28 DIAGNOSIS — M1712 Unilateral primary osteoarthritis, left knee: Secondary | ICD-10-CM | POA: Diagnosis not present

## 2022-05-28 DIAGNOSIS — M25662 Stiffness of left knee, not elsewhere classified: Secondary | ICD-10-CM

## 2022-05-28 DIAGNOSIS — R2689 Other abnormalities of gait and mobility: Secondary | ICD-10-CM

## 2022-05-28 DIAGNOSIS — M6281 Muscle weakness (generalized): Secondary | ICD-10-CM

## 2022-05-28 DIAGNOSIS — G8929 Other chronic pain: Secondary | ICD-10-CM

## 2022-05-30 ENCOUNTER — Encounter (HOSPITAL_BASED_OUTPATIENT_CLINIC_OR_DEPARTMENT_OTHER): Payer: Self-pay | Admitting: Physical Therapy

## 2022-05-30 ENCOUNTER — Ambulatory Visit (HOSPITAL_BASED_OUTPATIENT_CLINIC_OR_DEPARTMENT_OTHER): Payer: Medicare PPO | Admitting: Physical Therapy

## 2022-05-30 DIAGNOSIS — G8929 Other chronic pain: Secondary | ICD-10-CM

## 2022-05-30 DIAGNOSIS — M1712 Unilateral primary osteoarthritis, left knee: Secondary | ICD-10-CM | POA: Diagnosis not present

## 2022-05-30 DIAGNOSIS — M6281 Muscle weakness (generalized): Secondary | ICD-10-CM

## 2022-05-30 DIAGNOSIS — M25662 Stiffness of left knee, not elsewhere classified: Secondary | ICD-10-CM

## 2022-05-30 DIAGNOSIS — R2689 Other abnormalities of gait and mobility: Secondary | ICD-10-CM

## 2022-05-30 NOTE — Therapy (Signed)
OUTPATIENT PHYSICAL THERAPY TREATMENT   Patient Name: Jaime Rose MRN: [id number] DOB:[birth year], 72 y.o., female Today's Date: 05/30/2022  END OF SESSION:  PT End of Session - 05/30/22 1522     Visit Number 6    Number of Visits 16    Date for PT Re-Evaluation 07/04/22    Authorization Type [employer]    PT Start Time 1520    PT Stop Time 1600    PT Time Calculation (min) 40 min    Activity Tolerance Patient tolerated treatment well    Behavior During Therapy WFL for tasks assessed/performed              Past Medical History:  Diagnosis Date   ALLERGIC RHINITIS    [place], HX OF 12/21/2009   Qualifier: Diagnosis of  By: Felicity Coyer MD, Valerie A    Hyperthyroidism    normal TFTs 11/2009 , dx 10/2011   Varicose vein    s/p sclerosis tx summer 2013   Past Surgical History:  Procedure Laterality Date   ENDOVENOUS ABLATION SAPHENOUS VEIN W/ LASER  04-11-2012   endovenous laser ablation right greater saphenous vein and stab phlebectomy  right leg  by Gretta Began MD   Patient Active Problem List   Diagnosis Date Noted   Foot pain, left 06/02/2021   Ptosis of right eyelid 04/12/2020   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 12/29/2018   Osteopenia 01/10/2017   Right carotid bruit 12/21/2016   Prediabetes 12/21/2016   Hypothyroidism 03/20/2012   Varicose veins of lower extremities with other complications 12/21/2011   ALLERGIC RHINITIS 12/21/2009   Cardiac murmur 12/21/2009    PCP: Cheryll Cockayne  REFERRING PROVIDER: Huel Cote, MD  REFERRING DIAG: [phone] (ICD-10-CM) - Unilateral primary osteoarthritis, left knee  THERAPY DIAG:  Chronic pain of left knee  Stiffness of left knee, not elsewhere classified  Muscle weakness (generalized)  Other abnormalities of gait and mobility  Rationale for Evaluation and Treatment: Rehabilitation  ONSET DATE: Multiple years  SUBJECTIVE:   SUBJECTIVE STATEMENT: Pt reports loving the pool. Has continued to work on  her exercises. Feeling "discomfort" in anterior knee.    PERTINENT HISTORY: From PT order: Possible aquatic candidate  Hip girdle strengthening and LE strengthening for OA  From eval: Pt states her knee has been a problem for years. Pt notes that knee was exacerbated in Feb when her foot was swollen (cause is unknown). Pt points to pain primarily along the lateral knee. Pt states it's not excruciating but if she exerts herself she can feel the pain (I.e. going to [place] and going up/down the stairs and hills). Pt with plans to go to [place] in April. It does get stiff over night. Pt states she is doing HEP for her foot/ankle that she was given by PT.   PAIN:  Are you having pain? no: NPRS scale: 0/10, "just discomfort" Pain location: L anterior knee joint line Pain description: Dull/achy Aggravating factors: Knee bending, descending stairs Relieving factors: Ibuprofen,   PRECAUTIONS: None  WEIGHT BEARING RESTRICTIONS: No  FALLS:  Has patient fallen in last 6 months? No  LIVING ENVIRONMENT: Lives with: lives alone Lives in: House/apartment Stairs: Yes: Internal: 14-15 steps; can reach both and External: 4-5 steps; can reach both Has following equipment at home: None  OCCUPATION: Nutritionist  PATIENT GOALS: Decrease the "waddle", go up/down stairs   NEXT MD VISIT:   OBJECTIVE:   DIAGNOSTIC FINDINGS: x-ray performed 05/09/22 -- results pending.  PATIENT SURVEYS:  [name] 65;  predicted 73  MUSCLE LENGTH: Hamstrings: Right 80 deg; Left <60 deg (likely due to reduced knee ROM) Thomas test: WFL  POSTURE:   Increased knee valgus with Q angle >20 deg on L (greater in standing than supine). Patellar malalignment also noted -- appears slightly lateral to center in supine. R iliac crest higher than L in standing  PALPATION: TTP L lateral knee and glute med. Hypomobile patellar mobility  LOWER EXTREMITY ROM:  Active ROM Right eval Left eval Left 1/31  Knee flexion 120 100 N/A   Knee extension 0 -8 -5   (Blank rows = not tested)  LOWER EXTREMITY MMT:  MMT Right eval Left eval  Hip flexion 4+ 4-  Hip extension 3+ 3  Hip abduction 4- 3  Hip adduction    Hip internal rotation    Hip external rotation    Knee flexion 5 5  Knee extension 5 5 (at 10 deg knee flex)  Ankle dorsiflexion    Ankle plantarflexion    Ankle inversion    Ankle eversion     (Blank rows = not tested)  LOWER EXTREMITY SPECIAL TESTS:  Hip special tests: [name] (FABER) test: negative, Trendelenburg test: positive , Ober's test: positive , and Ely's test: negative  FUNCTIONAL TESTS:  SLS: 5 sec R, 1 sec L Squatting demos increased L knee valgus, R weight shift, decreased knee flexion to ~60 deg -- pt compensates into a lunge if she has to squat lower Stair descent demos poor eccentric quad strength with increased valgus  GAIT: Distance walked: 100' Assistive device utilized: None Level of assistance: Complete Independence Comments: L foot externally rotated, knee slightly flexed during stance, antalgic, compensated trendelenburg with trunk lean during stance phase  [facility] Adult PT Treatment:                                                DATE: 05/30/22 Therapeutic Exercise: Sitting hamstring stretch 2x30 sec Sitting adductor stretch 2x30 sec Sup ITB stretch with strap 2x30 sec Prone quad stretch with strap 2x30 sec Sup hip flexor stretch x2 min Gait: amb with heel strike and glute setting during stance phase Sit<>stand green TB around knees 2x10 Side step green TB 2x10 Gait training: Amb with heel strike decreased trunk lean, glute and quad setting with stance phase   [facility] Adult PT Treatment:                                                DATE: 05/28/22 Pt seen for aquatic therapy today.  Treatment took place in water 3.25-4.5 ft in depth at the [facility]. Temp of water was 91.  Pt entered/exited the pool via stairs independently with bilat rail. * gait  unsupported:  forward/ backward focused on heel strike, toe off and allowing Lt knee to flex during swing through *Forward and back amb with hand buoys submerged *side stepping yellow hand buoys submerged to side x 2 widths then side lunges with ue add/abd x 2 widths * TrA set : pushing solid blue noodle to knees Le staggered then wide stance x8 in each position * Holding solid blue noodle :1/2 diamonds, curtsy lunges *UE support on wall: minis-squats. Cues and demonstration for proper execution  weight through heels  *standing leaning on wall hamstring, gastroc, adductor and IT band stretch using noodle under ankle *solid noodle kick down  hip in neutral then ER x10 R/L   Pt requires the buoyancy and hydrostatic pressure of water for support, and to offload joints by unweighting joint load by at least 50 % in navel deep water and by at least 75-80% in chest to neck deep water.  Viscosity of the water is needed for resistance of strengthening. Water current perturbations provides challenge to standing balance requiring increased core activation.    [facility] Adult PT Treatment:                                                DATE: 05/25/22 Pt seen for aquatic therapy today.  Treatment took place in water 3.25-4.5 ft in depth at the [facility]. Temp of water was 91.  Pt entered/exited the pool via stairs independently with bilat rail. * gait unsupported:  forward/ backward focused on heel strike, toe off and allowing Lt knee to flex during swing through *  side stepping with hydro band above knees - adjusting step length and speed * TrA set with gentle press into yellow noodle x 5 sec hold x 10; then with added slow high knee marching * Holding wall : single leg clams (with limited height of foot on leg); curtsy lunges;  * standing with foot on bench in water: hamstring stretch x 20s; seated on bench -hamstring stretch x 20s  Pt requires the buoyancy and hydrostatic pressure of water for  support, and to offload joints by unweighting joint load by at least 50 % in navel deep water and by at least 75-80% in chest to neck deep water.  Viscosity of the water is needed for resistance of strengthening. Water current perturbations provides challenge to standing balance requiring increased core activation.    [facility] Adult PT Treatment:                                                DATE: 05/23/22 Therapeutic Exercise: Nustep L4 x 5 min LEs only Reviewed HEP Marching with ab set 2x10 ITB stretch 2x30 sec Sit<>stand red TB around knees 2x10 Side step red TB around knees 3x15' Standing ITB stretch x30 sec Captain Morgan's 10x5 sec Manual Therapy: IASTM with massage roller along ITB Gait training: Amb with heel strike decreased trunk lean, glute and quad setting with stance phase   [facility] Adult PT Treatment:                                                DATE: 05/16/22 Therapeutic Exercise: Nustep L5 x 5 min UEs/LEs Sitting Hamstring stretch 2x30 sec Quad set 2x10x3 sec SLR x10 SLR + ER x10 Supine ITB stretch with strap x 30 sec Bridging 2x10x3 sec Heel slide 2x10x3 sec Marching 2x10 Knee to chest on L 2x30 sec with hamstring curl but also uncomfortable for pt Sidelying Clamshell red TB 2x10 Attempted quadruped hip/knee flexion but too much for pt Manual Therapy: Grade II to III inferior and  lateral hip mobilization                                                                                                                              DATE: 05/09/22 See HEP  Has been doing the following from prior PT exercises: SLR (has extension lag) Ankle pumps Ankle ABCs Heel slides with strap  PATIENT EDUCATION:  Education details: Findings, initial HEP, POC Person educated: Patient Education method: Explanation, Demonstration, and Handouts Education comprehension: verbalized understanding, returned demonstration, and needs further education  HOME EXERCISE PROGRAM: Access  Code: [email or web address] URL: [email or web address] Date: 05/16/2022 Prepared by: Estill Bamberg April Thurnell Garbe  Exercises - Seated Hamstring Stretch  - 2 x daily - 7 x weekly - 2 sets - 30 sec hold - Seated Quad Set  - 2 x daily - 7 x weekly - 2 sets - 10 reps - 3 sec hold - Supine ITB Stretch with Strap  - 1 x daily - 7 x weekly - 2 sets - 30 sec hold - Clamshell with Resistance  - 1 x daily - 7 x weekly - 2 sets - 10 reps - Supine Heel Slide with Strap  - 1 x daily - 7 x weekly - 2 sets - 10 reps - Seated Straight Leg Raise with Support  - 1 x daily - 7 x weekly - 2 sets - 10 reps - Supine March  - 1 x daily - 7 x weekly - 2 sets - 10 reps - Supine Bridge  - 1 x daily - 7 x weekly - 2 sets - 10 reps - 3 sec hold  ASSESSMENT:  CLINICAL IMPRESSION: Progressed to green TB. Strengthening of glutes and quads. Continuing to work on hip/knee flexibility (especially hip flexors and quads today). Worked on Teacher, music. Improving knee ROM and gait.   OBJECTIVE IMPAIRMENTS: Abnormal gait, decreased balance, decreased endurance, decreased mobility, difficulty walking, decreased ROM, decreased strength, hypomobility, increased fascial restrictions, impaired flexibility, postural dysfunction, and pain.    GOALS: Goals reviewed with patient? Yes  SHORT TERM GOALS: Target date: 06/07/2022  Pt will be ind with initial HEP Baseline: Goal status: INITIAL  2.  Pt will demo L knee ROM >/= 5-105 deg for improved stair ascent/descent Baseline:  Goal status: INITIAL    LONG TERM GOALS: Target date: 07/05/2022   Pt will be ind with progression and advancement of HEP Baseline:  Goal status: INITIAL  2.  Pt will demo L knee ROM of >/= 5 - 115 deg for improved stair/hill ascent/descent Baseline:  Goal status: INITIAL  3.  Pt will be able to demo R & L SLS of >/=10 sec to show improved bilat LE stability Baseline: <5 sec Goal status: INITIAL  4.  Pt will be able to squat at least 20#  lower than 70 deg of knee flexion to demo improved functional LE strength for lifting and carrying Baseline: Currently performs with compensation and pain Goal status:  INITIAL  5.  Pt will demo normal reciprocal gait pattern without compensated trunk lean x1000' for community mobility Baseline:  Goal status: INITIAL  6.  Pt will have improved [name] score to >/=73 Baseline:  Goal status: INITIAL   PLAN:  PT FREQUENCY: 2x/week  PT DURATION: 8 weeks  PLANNED INTERVENTIONS: Therapeutic exercises, Therapeutic activity, Neuromuscular re-education, Balance training, Gait training, Patient/Family education, Self Care, Joint mobilization, Stair training, Orthotic/Fit training, Aquatic Therapy, Dry Needling, Electrical stimulation, Cryotherapy, Moist heat, Taping, Vasopneumatic device, Ultrasound, Ionotophoresis 4mg /ml Dexamethasone, Manual therapy, and Re-evaluation  PLAN FOR NEXT SESSION: Assess response to HEP. Work on knee ROM. Quad and glute stretching and strengthening. Progress to closed chain exercises as tolerated.   [facility] [address] [place], [place] 05/30/22 3:22 PM [facility] [address] [employer] [place], [place], [place] Phone: [phone]   Fax:  [phone]

## 2022-06-05 ENCOUNTER — Encounter (HOSPITAL_BASED_OUTPATIENT_CLINIC_OR_DEPARTMENT_OTHER): Payer: Self-pay | Admitting: Physical Therapy

## 2022-06-05 ENCOUNTER — Ambulatory Visit (HOSPITAL_BASED_OUTPATIENT_CLINIC_OR_DEPARTMENT_OTHER): Payer: Medicare PPO | Attending: Orthopaedic Surgery | Admitting: Physical Therapy

## 2022-06-05 DIAGNOSIS — M6281 Muscle weakness (generalized): Secondary | ICD-10-CM | POA: Diagnosis present

## 2022-06-05 DIAGNOSIS — G8929 Other chronic pain: Secondary | ICD-10-CM | POA: Diagnosis present

## 2022-06-05 DIAGNOSIS — M25662 Stiffness of left knee, not elsewhere classified: Secondary | ICD-10-CM | POA: Diagnosis present

## 2022-06-05 DIAGNOSIS — M25562 Pain in left knee: Secondary | ICD-10-CM | POA: Diagnosis present

## 2022-06-05 DIAGNOSIS — R2689 Other abnormalities of gait and mobility: Secondary | ICD-10-CM | POA: Insufficient documentation

## 2022-06-05 NOTE — Therapy (Signed)
OUTPATIENT PHYSICAL THERAPY TREATMENT   Patient Name: Jaime Rose MRN: [id number] DOB:1950/07/17, 72 y.o., female Today's Date: 06/05/2022  END OF SESSION:  PT End of Session - 06/05/22 1623     Visit Number 7    Number of Visits 16    Date for PT Re-Evaluation 07/04/22    Authorization Type [employer]    PT Start Time 1619    PT Stop Time 1700    PT Time Calculation (min) 41 min    Activity Tolerance Patient tolerated treatment well    Behavior During Therapy WFL for tasks assessed/performed              Past Medical History:  Diagnosis Date   ALLERGIC RHINITIS    [place], HX OF 12/21/2009   Qualifier: Diagnosis of  By: Felicity Coyer MD, Valerie A    Hyperthyroidism    normal TFTs 11/2009 , dx 10/2011   Varicose vein    s/p sclerosis tx summer 2013   Past Surgical History:  Procedure Laterality Date   ENDOVENOUS ABLATION SAPHENOUS VEIN W/ LASER  04-11-2012   endovenous laser ablation right greater saphenous vein and stab phlebectomy  right leg  by Gretta Began MD   Patient Active Problem List   Diagnosis Date Noted   Foot pain, left 06/02/2021   Ptosis of right eyelid 04/12/2020   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 12/29/2018   Osteopenia 01/10/2017   Right carotid bruit 12/21/2016   Prediabetes 12/21/2016   Hypothyroidism 03/20/2012   Varicose veins of lower extremities with other complications 12/21/2011   ALLERGIC RHINITIS 12/21/2009   Cardiac murmur 12/21/2009    PCP: Cheryll Cockayne  REFERRING PROVIDER: Huel Cote, MD  REFERRING DIAG: M17.12 (ICD-10-CM) - Unilateral primary osteoarthritis, left knee  THERAPY DIAG:  Chronic pain of left knee  Stiffness of left knee, not elsewhere classified  Muscle weakness (generalized)  Other abnormalities of gait and mobility  Rationale for Evaluation and Treatment: Rehabilitation  ONSET DATE: Multiple years  SUBJECTIVE:   SUBJECTIVE STATEMENT: Good response to last session with reduction in pain  and pt reported improvement with walking. She reports compliance with HEP daily.   PERTINENT HISTORY: From PT order: Possible aquatic candidate  Hip girdle strengthening and LE strengthening for OA  From eval: Pt states her knee has been a problem for years. Pt notes that knee was exacerbated in Feb when her foot was swollen (cause is unknown). Pt points to pain primarily along the lateral knee. Pt states it's not excruciating but if she exerts herself she can feel the pain (I.e. going to [place] and going up/down the stairs and hills). Pt with plans to go to [place] in April. It does get stiff over night. Pt states she is doing HEP for her foot/ankle that she was given by PT.   PAIN:  Are you having pain? no: NPRS scale: 0/10, "just discomfort" Pain location: L anterior knee joint line Pain description: Dull/achy Aggravating factors: Knee bending, descending stairs Relieving factors: Ibuprofen,   PRECAUTIONS: None  WEIGHT BEARING RESTRICTIONS: No  FALLS:  Has patient fallen in last 6 months? No  LIVING ENVIRONMENT: Lives with: lives alone Lives in: House/apartment Stairs: Yes: Internal: 14-15 steps; can reach both and External: 4-5 steps; can reach both Has following equipment at home: None  OCCUPATION: Nutritionist  PATIENT GOALS: Decrease the "waddle", go up/down stairs   NEXT MD VISIT:   OBJECTIVE:   DIAGNOSTIC FINDINGS: x-ray performed 05/09/22 -- results pending.  PATIENT SURVEYS:  [name] 65; predicted 73  MUSCLE LENGTH: Hamstrings: Right 80 deg; Left <60 deg (likely due to reduced knee ROM) Thomas test: WFL  POSTURE:   Increased knee valgus with Q angle >20 deg on L (greater in standing than supine). Patellar malalignment also noted -- appears slightly lateral to center in supine. R iliac crest higher than L in standing  PALPATION: TTP L lateral knee and glute med. Hypomobile patellar mobility  LOWER EXTREMITY ROM:  Active ROM Right eval Left eval Left 1/31   Knee flexion 120 100 N/A  Knee extension 0 -8 -5   (Blank rows = not tested)  LOWER EXTREMITY MMT:  MMT Right eval Left eval  Hip flexion 4+ 4-  Hip extension 3+ 3  Hip abduction 4- 3  Hip adduction    Hip internal rotation    Hip external rotation    Knee flexion 5 5  Knee extension 5 5 (at 10 deg knee flex)  Ankle dorsiflexion    Ankle plantarflexion    Ankle inversion    Ankle eversion     (Blank rows = not tested)  LOWER EXTREMITY SPECIAL TESTS:  Hip special tests: [name] (FABER) test: negative, Trendelenburg test: positive , Ober's test: positive , and Ely's test: negative  FUNCTIONAL TESTS:  SLS: 5 sec R, 1 sec L Squatting demos increased L knee valgus, R weight shift, decreased knee flexion to ~60 deg -- pt compensates into a lunge if she has to squat lower Stair descent demos poor eccentric quad strength with increased valgus  GAIT: Distance walked: 100' Assistive device utilized: None Level of assistance: Complete Independence Comments: L foot externally rotated, knee slightly flexed during stance, antalgic, compensated trendelenburg with trunk lean during stance phase  [facility] Adult PT Treatment:                                                DATE:  06/05/22 Pt seen for aquatic therapy today.  Treatment took place in water 3.25-4.5 ft in depth at the [facility]. Temp of water was 91.  Pt entered/exited the pool via stairs independently with bilat rail.  * gait unsupported:  forward/ backward  *standing leaning on wall :IT band stretch *Standing ue support on wall 4.0 ft hip circles CW & ccw; solid noodle kick downs  hip in neutral then ER x10 R/L ue support yellow hand buoys for added core firing and ankle strengthening *Forward and back amb with hand buoys submerged *Lateral step ups leading R and L x 12 *VMO strengthening/forward step downs x 10 *STS from 3 (bottom) step x 10 gaining immediate standing balance 9/10 x. *adductor sets using  BB *STS from 3rd step with add set x 5 *cycling on yellow noodle   Pt requires the buoyancy and hydrostatic pressure of water for support, and to offload joints by unweighting joint load by at least 50 % in navel deep water and by at least 75-80% in chest to neck deep water.  Viscosity of the water is needed for resistance of strengthening. Water current perturbations provides challenge to standing balance requiring increased core activation.   05/30/22 Therapeutic Exercise: Sitting hamstring stretch 2x30 sec Sitting adductor stretch 2x30 sec Sup ITB stretch with strap 2x30 sec Prone quad stretch with strap 2x30 sec Sup hip flexor stretch x2 min Gait: amb with heel strike and glute  setting during stance phase Sit<>stand green TB around knees 2x10 Side step green TB 2x10 Gait training: Amb with heel strike decreased trunk lean, glute and quad setting with stance phase   [facility] Adult PT Treatment:                                                DATE: 05/28/22 Pt seen for aquatic therapy today.  Treatment took place in water 3.25-4.5 ft in depth at the [facility]. Temp of water was 91.  Pt entered/exited the pool via stairs independently with bilat rail. * gait unsupported:  forward/ backward focused on heel strike, toe off and allowing Lt knee to flex during swing through *Forward and back amb with hand buoys submerged *side stepping yellow hand buoys submerged to side x 2 widths then side lunges with ue add/abd x 2 widths * TrA set : pushing solid blue noodle to knees Le staggered then wide stance x8 in each position * Holding solid blue noodle :1/2 diamonds, curtsy lunges *UE support on wall: minis-squats. Cues and demonstration for proper execution weight through heels  *standing leaning on wall hamstring, gastroc, adductor and IT band stretch using noodle under ankle *solid noodle kick down  hip in neutral then ER x10 R/L   Pt requires the buoyancy and hydrostatic  pressure of water for support, and to offload joints by unweighting joint load by at least 50 % in navel deep water and by at least 75-80% in chest to neck deep water.  Viscosity of the water is needed for resistance of strengthening. Water current perturbations provides challenge to standing balance requiring increased core activation.    [facility] Adult PT Treatment:                                                DATE: 05/25/22 Pt seen for aquatic therapy today.  Treatment took place in water 3.25-4.5 ft in depth at the [facility]. Temp of water was 91.  Pt entered/exited the pool via stairs independently with bilat rail. * gait unsupported:  forward/ backward focused on heel strike, toe off and allowing Lt knee to flex during swing through *  side stepping with hydro band above knees - adjusting step length and speed * TrA set with gentle press into yellow noodle x 5 sec hold x 10; then with added slow high knee marching * Holding wall : single leg clams (with limited height of foot on leg); curtsy lunges;  * standing with foot on bench in water: hamstring stretch x 20s; seated on bench -hamstring stretch x 20s  Pt requires the buoyancy and hydrostatic pressure of water for support, and to offload joints by unweighting joint load by at least 50 % in navel deep water and by at least 75-80% in chest to neck deep water.  Viscosity of the water is needed for resistance of strengthening. Water current perturbations provides challenge to standing balance requiring increased core activation.    [facility] Adult PT Treatment:  DATE: 05/23/22 Therapeutic Exercise: Nustep L4 x 5 min LEs only Reviewed HEP Marching with ab set 2x10 ITB stretch 2x30 sec Sit<>stand red TB around knees 2x10 Side step red TB around knees 3x15' Standing ITB stretch x30 sec Captain Morgan's 10x5 sec Manual Therapy: IASTM with massage roller along ITB Gait training: Amb  with heel strike decreased trunk lean, glute and quad setting with stance phase   [facility] Adult PT Treatment:                                                DATE: 05/16/22 Therapeutic Exercise: Nustep L5 x 5 min UEs/LEs Sitting Hamstring stretch 2x30 sec Quad set 2x10x3 sec SLR x10 SLR + ER x10 Supine ITB stretch with strap x 30 sec Bridging 2x10x3 sec Heel slide 2x10x3 sec Marching 2x10 Knee to chest on L 2x30 sec with hamstring curl but also uncomfortable for pt Sidelying Clamshell red TB 2x10 Attempted quadruped hip/knee flexion but too much for pt Manual Therapy: Grade II to III inferior and lateral hip mobilization                                                                                                                              DATE: 05/09/22 See HEP  Has been doing the following from prior PT exercises: SLR (has extension lag) Ankle pumps Ankle ABCs Heel slides with strap  PATIENT EDUCATION:  Education details: Findings, initial HEP, POC Person educated: Patient Education method: Explanation, Demonstration, and Handouts Education comprehension: verbalized understanding, returned demonstration, and needs further education  HOME EXERCISE PROGRAM: Access Code: [email or web address] URL: [email or web address] Date: 05/16/2022 Prepared by: Estill Bamberg April Thurnell Garbe  Exercises - Seated Hamstring Stretch  - 2 x daily - 7 x weekly - 2 sets - 30 sec hold - Seated Quad Set  - 2 x daily - 7 x weekly - 2 sets - 10 reps - 3 sec hold - Supine ITB Stretch with Strap  - 1 x daily - 7 x weekly - 2 sets - 30 sec hold - Clamshell with Resistance  - 1 x daily - 7 x weekly - 2 sets - 10 reps - Supine Heel Slide with Strap  - 1 x daily - 7 x weekly - 2 sets - 10 reps - Seated Straight Leg Raise with Support  - 1 x daily - 7 x weekly - 2 sets - 10 reps - Supine March  - 1 x daily - 7 x weekly - 2 sets - 10 reps - Supine Bridge  - 1 x daily - 7 x weekly - 2 sets - 10 reps - 3  sec hold  ASSESSMENT:  CLINICAL IMPRESSION: Progressed with strengthening glutes and quads aquatic based. Proprioception retraining initiated. Cues for abdominal bracing for improved core control. She  tolerates well without  increase in pain. Goals ongoing    OBJECTIVE IMPAIRMENTS: Abnormal gait, decreased balance, decreased endurance, decreased mobility, difficulty walking, decreased ROM, decreased strength, hypomobility, increased fascial restrictions, impaired flexibility, postural dysfunction, and pain.    GOALS: Goals reviewed with patient? Yes  SHORT TERM GOALS: Target date: 06/07/2022  Pt will be ind with initial HEP Baseline: Goal status: INITIAL  2.  Pt will demo L knee ROM >/= 5-105 deg for improved stair ascent/descent Baseline:  Goal status: INITIAL    LONG TERM GOALS: Target date: 07/05/2022   Pt will be ind with progression and advancement of HEP Baseline:  Goal status: INITIAL  2.  Pt will demo L knee ROM of >/= 5 - 115 deg for improved stair/hill ascent/descent Baseline:  Goal status: INITIAL  3.  Pt will be able to demo R & L SLS of >/=10 sec to show improved bilat LE stability Baseline: <5 sec Goal status: INITIAL  4.  Pt will be able to squat at least 20# lower than 70 deg of knee flexion to demo improved functional LE strength for lifting and carrying Baseline: Currently performs with compensation and pain Goal status: INITIAL  5.  Pt will demo normal reciprocal gait pattern without compensated trunk lean x1000' for community mobility Baseline:  Goal status: INITIAL  6.  Pt will have improved [name] score to >/=73 Baseline:  Goal status: INITIAL   PLAN:  PT FREQUENCY: 2x/week  PT DURATION: 8 weeks  PLANNED INTERVENTIONS: Therapeutic exercises, Therapeutic activity, Neuromuscular re-education, Balance training, Gait training, Patient/Family education, Self Care, Joint mobilization, Stair training, Orthotic/Fit training, Aquatic Therapy, Dry  Needling, Electrical stimulation, Cryotherapy, Moist heat, Taping, Vasopneumatic device, Ultrasound, Ionotophoresis 4mg /ml Dexamethasone, Manual therapy, and Re-evaluation  PLAN FOR NEXT SESSION: Assess response to HEP. Work on knee ROM. Quad and glute stretching and strengthening. Progress to closed chain exercises as tolerated.   Denton Meek, PTMPT 06/05/22 4:24 PM [facility] [address] [place], [place], [address] Phone: [phone]   Fax:  [phone]

## 2022-06-08 ENCOUNTER — Encounter (HOSPITAL_BASED_OUTPATIENT_CLINIC_OR_DEPARTMENT_OTHER): Payer: Self-pay | Admitting: Physical Therapy

## 2022-06-08 ENCOUNTER — Ambulatory Visit (HOSPITAL_BASED_OUTPATIENT_CLINIC_OR_DEPARTMENT_OTHER): Payer: Medicare PPO | Admitting: Physical Therapy

## 2022-06-08 DIAGNOSIS — G8929 Other chronic pain: Secondary | ICD-10-CM

## 2022-06-08 DIAGNOSIS — M25662 Stiffness of left knee, not elsewhere classified: Secondary | ICD-10-CM

## 2022-06-08 DIAGNOSIS — R2689 Other abnormalities of gait and mobility: Secondary | ICD-10-CM

## 2022-06-08 DIAGNOSIS — M25562 Pain in left knee: Secondary | ICD-10-CM | POA: Diagnosis not present

## 2022-06-08 DIAGNOSIS — M6281 Muscle weakness (generalized): Secondary | ICD-10-CM

## 2022-06-08 NOTE — Therapy (Signed)
OUTPATIENT PHYSICAL THERAPY TREATMENT   Patient Name: Jaime Rose MRN: [id number] DOB:Jan 17, 1951, 72 y.o., female Today's Date: 06/08/2022  END OF SESSION:     Past Medical History:  Diagnosis Date   ALLERGIC RHINITIS    [place], HX OF 12/21/2009   Qualifier: Diagnosis of  By: Asa Lente MD, Valerie A    Hyperthyroidism    normal TFTs 11/2009 , dx 10/2011   Varicose vein    s/p sclerosis tx summer 2013   Past Surgical History:  Procedure Laterality Date   ENDOVENOUS ABLATION SAPHENOUS VEIN W/ LASER  04-11-2012   endovenous laser ablation right greater saphenous vein and stab phlebectomy  right leg  by Curt Jews MD   Patient Active Problem List   Diagnosis Date Noted   Foot pain, left 06/02/2021   Ptosis of right eyelid 04/12/2020   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  Rationale for Evaluation and Treatment: Rehabilitation  ONSET DATE: Multiple years  SUBJECTIVE:   SUBJECTIVE STATEMENT: The patient reports overall she is doing well. She has been working on her exercises.    PERTINENT HISTORY: From PT order: Possible aquatic candidate  Hip girdle strengthening and LE strengthening for OA  From eval: Pt states her knee has been a problem for years. Pt notes that knee was exacerbated in Feb when her foot was swollen (cause is unknown). Pt points to pain primarily along the lateral knee. Pt states it's not excruciating but if she exerts herself she can feel the pain (I.e. going to [place] and going up/down the stairs and hills). Pt  with plans to go to [place] in April. It does get stiff over night. Pt states she is doing HEP for her foot/ankle that she was given by PT.   PAIN:  Are you having pain? no: NPRS scale: 0/10, "just discomfort" Pain location: L anterior knee joint line Pain description: Dull/achy Aggravating factors: Knee bending, descending stairs Relieving factors: Ibuprofen,   PRECAUTIONS: None  WEIGHT BEARING RESTRICTIONS: No  FALLS:  Has patient fallen in last 6 months? No  LIVING ENVIRONMENT: Lives with: lives alone Lives in: House/apartment Stairs: Yes: Internal: 14-15 steps; can reach both and External: 4-5 steps; can reach both Has following equipment at home: None  OCCUPATION: Nutritionist  PATIENT GOALS: Decrease the "waddle", go up/down stairs   NEXT MD VISIT:   OBJECTIVE:   DIAGNOSTIC FINDINGS: x-ray performed 05/09/22 -- results pending.  PATIENT SURVEYS:  [name] 65; predicted 73  MUSCLE LENGTH: Hamstrings: Right 80 deg; Left <60 deg (likely due to reduced knee ROM) Thomas test: WFL  POSTURE:   Increased knee valgus with Q angle >20 deg on L (greater in standing than supine). Patellar malalignment also noted -- appears slightly lateral to center in supine. R iliac crest higher than L in standing  PALPATION: TTP L lateral knee and glute med. Hypomobile patellar mobility  LOWER EXTREMITY ROM:  Active ROM Right eval Left eval Left 1/31  Knee flexion 120 100 N/A  Knee extension 0 -8 -5   (Blank rows = not tested)  LOWER EXTREMITY MMT:  MMT Right  eval Left eval  Hip flexion 4+ 4-  Hip extension 3+ 3  Hip abduction 4- 3  Hip adduction    Hip internal rotation    Hip external rotation    Knee flexion 5 5  Knee extension 5 5 (at 10 deg knee flex)  Ankle dorsiflexion    Ankle plantarflexion    Ankle inversion    Ankle eversion     (Blank rows = not tested)  LOWER EXTREMITY SPECIAL TESTS:  Hip special tests: [name] (FABER) test: negative, Trendelenburg test:  positive , Ober's test: positive , and Ely's test: negative  FUNCTIONAL TESTS:  SLS: 5 sec R, 1 sec L Squatting demos increased L knee valgus, R weight shift, decreased knee flexion to ~60 deg -- pt compensates into a lunge if she has to squat lower Stair descent demos poor eccentric quad strength with increased valgus  GAIT: Distance walked: 100' Assistive device utilized: None Level of assistance: Complete Independence Comments: L foot externally rotated, knee slightly flexed during stance, antalgic, compensated trendelenburg with trunk lean during stance phase  [facility] Adult PT Treatment:                                                DATE:  2/9 Manual: PA and AP glides; patella mobilization.   SLR: 2x10  Bridge 2x10 with green band   Standing:  Gastroc stretch 3x20 sec hold   Step up 4 inch 2x10  Lateral step up 4 inch 2x10            06/05/22 Pt seen for aquatic therapy today.  Treatment took place in water 3.25-4.5 ft in depth at the [facility]. Temp of water was 91.  Pt entered/exited the pool via stairs independently with bilat rail.  * gait unsupported:  forward/ backward  *standing leaning on wall :IT band stretch *Standing ue support on wall 4.0 ft hip circles CW & ccw; solid noodle kick downs  hip in neutral then ER x10 R/L ue support yellow hand buoys for added core firing and ankle strengthening *Forward and back amb with hand buoys submerged *Lateral step ups leading R and L x 12 *VMO strengthening/forward step downs x 10 *STS from 3 (bottom) step x 10 gaining immediate standing balance 9/10 x. *adductor sets using BB *STS from 3rd step with add set x 5 *cycling on yellow noodle   Pt requires the buoyancy and hydrostatic pressure of water for support, and to offload joints by unweighting joint load by at least 50 % in navel deep water and by at least 75-80% in chest to neck deep water.  Viscosity of the water is needed for resistance of  strengthening. Water current perturbations provides challenge to standing balance requiring increased core activation.   05/30/22 Therapeutic Exercise: Sitting hamstring stretch 2x30 sec Sitting adductor stretch 2x30 sec Sup ITB stretch with strap 2x30 sec Prone quad stretch with strap 2x30 sec Sup hip flexor stretch x2 min Gait: amb with heel strike and glute setting during stance phase Sit<>stand green TB around knees 2x10 Side step green TB 2x10 Gait training: Amb with heel strike decreased trunk lean, glute and quad setting with stance phase   [facility] Adult PT Treatment:  DATE: 05/28/22 Pt seen for aquatic therapy today.  Treatment took place in water 3.25-4.5 ft in depth at the [facility]. Temp of water was 91.  Pt entered/exited the pool via stairs independently with bilat rail. * gait unsupported:  forward/ backward focused on heel strike, toe off and allowing Lt knee to flex during swing through *Forward and back amb with hand buoys submerged *side stepping yellow hand buoys submerged to side x 2 widths then side lunges with ue add/abd x 2 widths * TrA set : pushing solid blue noodle to knees Le staggered then wide stance x8 in each position * Holding solid blue noodle :1/2 diamonds, curtsy lunges *UE support on wall: minis-squats. Cues and demonstration for proper execution weight through heels  *standing leaning on wall hamstring, gastroc, adductor and IT band stretch using noodle under ankle *solid noodle kick down  hip in neutral then ER x10 R/L   Pt requires the buoyancy and hydrostatic pressure of water for support, and to offload joints by unweighting joint load by at least 50 % in navel deep water and by at least 75-80% in chest to neck deep water.  Viscosity of the water is needed for resistance of strengthening. Water current perturbations provides challenge to standing balance requiring increased core  activation.    [facility] Adult PT Treatment:                                                DATE: 05/25/22 Pt seen for aquatic therapy today.  Treatment took place in water 3.25-4.5 ft in depth at the [facility]. Temp of water was 91.  Pt entered/exited the pool via stairs independently with bilat rail. * gait unsupported:  forward/ backward focused on heel strike, toe off and allowing Lt knee to flex during swing through *  side stepping with hydro band above knees - adjusting step length and speed * TrA set with gentle press into yellow noodle x 5 sec hold x 10; then with added slow high knee marching * Holding wall : single leg clams (with limited height of foot on leg); curtsy lunges;  * standing with foot on bench in water: hamstring stretch x 20s; seated on bench -hamstring stretch x 20s  Pt requires the buoyancy and hydrostatic pressure of water for support, and to offload joints by unweighting joint load by at least 50 % in navel deep water and by at least 75-80% in chest to neck deep water.  Viscosity of the water is needed for resistance of strengthening. Water current perturbations provides challenge to standing balance requiring increased core activation.    [facility] Adult PT Treatment:                                                DATE: 05/23/22 Therapeutic Exercise: Nustep L4 x 5 min LEs only Reviewed HEP Marching with ab set 2x10 ITB stretch 2x30 sec Sit<>stand red TB around knees 2x10 Side step red TB around knees 3x15' Standing ITB stretch x30 sec Captain Morgan's 10x5 sec Manual Therapy: IASTM with massage roller along ITB Gait training: Amb with heel strike decreased trunk lean, glute and quad setting with stance phase   [facility] Adult PT  Treatment:                                                DATE: 05/16/22 Therapeutic Exercise: Nustep L5 x 5 min UEs/LEs Sitting Hamstring stretch 2x30 sec Quad set 2x10x3 sec SLR x10 SLR + ER x10 Supine ITB stretch with  strap x 30 sec Bridging 2x10x3 sec Heel slide 2x10x3 sec Marching 2x10 Knee to chest on L 2x30 sec with hamstring curl but also uncomfortable for pt Sidelying Clamshell red TB 2x10 Attempted quadruped hip/knee flexion but too much for pt Manual Therapy: Grade II to III inferior and lateral hip mobilization                                                                                                                              DATE: 05/09/22 See HEP  Has been doing the following from prior PT exercises: SLR (has extension lag) Ankle pumps Ankle ABCs Heel slides with strap  PATIENT EDUCATION:  Education details: Findings, initial HEP, POC Person educated: Patient Education method: Explanation, Demonstration, and Handouts Education comprehension: verbalized understanding, returned demonstration, and needs further education  HOME EXERCISE PROGRAM: Access Code: [email or web address] URL: [email or web address] Date: 05/16/2022 Prepared by: Estill Bamberg April Thurnell Garbe  Exercises - Seated Hamstring Stretch  - 2 x daily - 7 x weekly - 2 sets - 30 sec hold - Seated Quad Set  - 2 x daily - 7 x weekly - 2 sets - 10 reps - 3 sec hold - Supine ITB Stretch with Strap  - 1 x daily - 7 x weekly - 2 sets - 30 sec hold - Clamshell with Resistance  - 1 x daily - 7 x weekly - 2 sets - 10 reps - Supine Heel Slide with Strap  - 1 x daily - 7 x weekly - 2 sets - 10 reps - Seated Straight Leg Raise with Support  - 1 x daily - 7 x weekly - 2 sets - 10 reps - Supine March  - 1 x daily - 7 x weekly - 2 sets - 10 reps - Supine Bridge  - 1 x daily - 7 x weekly - 2 sets - 10 reps - 3 sec hold  ASSESSMENT:  CLINICAL IMPRESSION: The patient is making good progress. We performed a trial of manual therapy to the left knee. We also work on IT trainer. Therapy will continue to progress as tolerated.    OBJECTIVE IMPAIRMENTS: Abnormal gait, decreased balance, decreased endurance, decreased mobility,  difficulty walking, decreased ROM, decreased strength, hypomobility, increased fascial restrictions, impaired flexibility, postural dysfunction, and pain.    GOALS: Goals reviewed with patient? Yes  SHORT TERM GOALS: Target date: 06/07/2022  Pt will be ind with initial HEP Baseline: Goal status: INITIAL  2.  Pt will demo L knee ROM >/=  5-105 deg for improved stair ascent/descent Baseline:  Goal status: INITIAL    LONG TERM GOALS: Target date: 07/05/2022   Pt will be ind with progression and advancement of HEP Baseline:  Goal status: INITIAL  2.  Pt will demo L knee ROM of >/= 5 - 115 deg for improved stair/hill ascent/descent Baseline:  Goal status: INITIAL  3.  Pt will be able to demo R & L SLS of >/=10 sec to show improved bilat LE stability Baseline: <5 sec Goal status: INITIAL  4.  Pt will be able to squat at least 20# lower than 70 deg of knee flexion to demo improved functional LE strength for lifting and carrying Baseline: Currently performs with compensation and pain Goal status: INITIAL  5.  Pt will demo normal reciprocal gait pattern without compensated trunk lean x1000' for community mobility Baseline:  Goal status: INITIAL  6.  Pt will have improved [name] score to >/=73 Baseline:  Goal status: INITIAL   PLAN:  PT FREQUENCY: 2x/week  PT DURATION: 8 weeks  PLANNED INTERVENTIONS: Therapeutic exercises, Therapeutic activity, Neuromuscular re-education, Balance training, Gait training, Patient/Family education, Self Care, Joint mobilization, Stair training, Orthotic/Fit training, Aquatic Therapy, Dry Needling, Electrical stimulation, Cryotherapy, Moist heat, Taping, Vasopneumatic device, Ultrasound, Ionotophoresis 4m/ml Dexamethasone, Manual therapy, and Re-evaluation  PLAN FOR NEXT SESSION: Assess response to HEP. Work on knee ROM. Quad and glute stretching and strengthening. Progress to closed chain exercises as tolerated.   DCarney Living  PTMPT 06/08/22 2:57 PM CDinubaRehab Services [address]oberts[place] [address]Phone: [phone]  Fax:  [phone]

## 2022-06-12 ENCOUNTER — Ambulatory Visit (HOSPITAL_BASED_OUTPATIENT_CLINIC_OR_DEPARTMENT_OTHER): Payer: Medicare PPO | Admitting: Physical Therapy

## 2022-06-12 ENCOUNTER — Encounter (HOSPITAL_BASED_OUTPATIENT_CLINIC_OR_DEPARTMENT_OTHER): Payer: Self-pay | Admitting: Physical Therapy

## 2022-06-12 DIAGNOSIS — M6281 Muscle weakness (generalized): Secondary | ICD-10-CM

## 2022-06-12 DIAGNOSIS — M25662 Stiffness of left knee, not elsewhere classified: Secondary | ICD-10-CM

## 2022-06-12 DIAGNOSIS — G8929 Other chronic pain: Secondary | ICD-10-CM

## 2022-06-12 DIAGNOSIS — R2689 Other abnormalities of gait and mobility: Secondary | ICD-10-CM

## 2022-06-12 DIAGNOSIS — M25562 Pain in left knee: Secondary | ICD-10-CM | POA: Diagnosis not present

## 2022-06-12 NOTE — Therapy (Signed)
OUTPATIENT PHYSICAL THERAPY TREATMENT   Patient Name: Jaime Rose MRN: [id number] DOB:1950/07/23, 72 y.o., female Today's Date: 06/12/2022  END OF SESSION:  PT End of Session - 06/12/22 1605     Visit Number 9    Number of Visits 16    Date for PT Re-Evaluation 07/04/22    Authorization Type [employer]    PT Start Time 1616    PT Stop Time 1700    PT Time Calculation (min) 44 min    Activity Tolerance Patient tolerated treatment well    Behavior During Therapy WFL for tasks assessed/performed               Past Medical History:  Diagnosis Date   ALLERGIC RHINITIS    [place], HX OF 12/21/2009   Qualifier: Diagnosis of  By: Asa Lente MD, Valerie A    Hyperthyroidism    normal TFTs 11/2009 , dx 10/2011   Varicose vein    s/p sclerosis tx summer 2013   Past Surgical History:  Procedure Laterality Date   ENDOVENOUS ABLATION SAPHENOUS VEIN W/ LASER  04-11-2012   endovenous laser ablation right greater saphenous vein and stab phlebectomy  right leg  by Curt Jews MD   Patient Active Problem List   Diagnosis Date Noted   Foot pain, left 06/02/2021   Ptosis of right eyelid 04/12/2020   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 12/29/2018   Osteopenia 01/10/2017   Right carotid bruit 12/21/2016   Prediabetes 12/21/2016   Hypothyroidism 03/20/2012   Varicose veins of lower extremities with other complications [id number]   ALLERGIC RHINITIS 12/21/2009   Cardiac murmur 12/21/2009    PCP: Billey Gosling  REFERRING PROVIDER: Vanetta Mulders, MD  REFERRING DIAG: [phone] (ICD-10-CM) - Unilateral primary osteoarthritis, left knee  THERAPY DIAG:  Chronic pain of left knee  Stiffness of left knee, not elsewhere classified  Muscle weakness (generalized)  Other abnormalities of gait and mobility  Rationale for Evaluation and Treatment: Rehabilitation  ONSET DATE: Multiple years  SUBJECTIVE:   SUBJECTIVE STATEMENT: T"I feel stiff no pain"    PERTINENT  HISTORY: From PT order: Possible aquatic candidate  Hip girdle strengthening and LE strengthening for OA  From eval: Pt states her knee has been a problem for years. Pt notes that knee was exacerbated in Feb when her foot was swollen (cause is unknown). Pt points to pain primarily along the lateral knee. Pt states it's not excruciating but if she exerts herself she can feel the pain (I.e. going to [place] and going up/down the stairs and hills). Pt with plans to go to [place] in April. It does get stiff over night. Pt states she is doing HEP for her foot/ankle that she was given by PT.   PAIN:  Are you having pain? no: NPRS scale: 0/10, "just discomfort" Pain location: L anterior knee joint line Pain description: Dull/achy Aggravating factors: Knee bending, descending stairs Relieving factors: Ibuprofen,   PRECAUTIONS: None  WEIGHT BEARING RESTRICTIONS: No  FALLS:  Has patient fallen in last 6 months? No  LIVING ENVIRONMENT: Lives with: lives alone Lives in: House/apartment Stairs: Yes: Internal: 14-15 steps; can reach both and External: 4-5 steps; can reach both Has following equipment at home: None  OCCUPATION: Nutritionist  PATIENT GOALS: Decrease the "waddle", go up/down stairs   NEXT MD VISIT:   OBJECTIVE:   DIAGNOSTIC FINDINGS: x-ray performed 05/09/22 -- results pending.  PATIENT SURVEYS:  [name] 65; predicted 73  MUSCLE LENGTH: Hamstrings: Right 80 deg; Left <60  deg (likely due to reduced knee ROM) Thomas test: WFL  POSTURE:   Increased knee valgus with Q angle >20 deg on L (greater in standing than supine). Patellar malalignment also noted -- appears slightly lateral to center in supine. R iliac crest higher than L in standing  PALPATION: TTP L lateral knee and glute med. Hypomobile patellar mobility  LOWER EXTREMITY ROM:  Active ROM Right eval Left eval Left 1/31 Left 06/22/20  Knee flexion 120 100 N/A 116 Seated  Knee extension 0 -8 -5 -5   (Blank rows  = not tested)  LOWER EXTREMITY MMT:  MMT Right eval Left eval  Hip flexion 4+ 4-  Hip extension 3+ 3  Hip abduction 4- 3  Hip adduction    Hip internal rotation    Hip external rotation    Knee flexion 5 5  Knee extension 5 5 (at 10 deg knee flex)  Ankle dorsiflexion    Ankle plantarflexion    Ankle inversion    Ankle eversion     (Blank rows = not tested)  LOWER EXTREMITY SPECIAL TESTS:  Hip special tests: [name] (FABER) test: negative, Trendelenburg test: positive , Ober's test: positive , and Ely's test: negative  FUNCTIONAL TESTS:  SLS: 5 sec R, 1 sec L Squatting demos increased L knee valgus, R weight shift, decreased knee flexion to ~60 deg -- pt compensates into a lunge if she has to squat lower Stair descent demos poor eccentric quad strength with increased valgus  GAIT: Distance walked: 100' Assistive device utilized: None Level of assistance: Complete Independence Comments: L foot externally rotated, knee slightly flexed during stance, antalgic, compensated trendelenburg with trunk lean during stance phase   06/12/22 Pt seen for aquatic therapy today.  Treatment took place in water 3.25-4.5 ft in depth at the [facility]. Temp of water was 91.  Pt entered/exited the pool via stairs independently with bilat rail.  * gait unsupported:  forward/ backward  *Lateral step ups leading R and L x 12 *Standing ue support on wall: curtsy squats; solid noodle kick downs hip in neutral then ER then neutral x12 R/L unsupported. Cues for SLS, abdominal bracing *Hip hinges with LB stretch x 10.  Cues for target muscle/execution as well as demonstration *Forward jogging x 6 widths *Forward skipping x 4 widths *stair tapping Balance *STS from 3 (bottom) step x 10 gaining immediate standing balance 9/10 x. *adductor sets using BB *STS from 3rd step with add set x 10; from 4th (bottom/less submerged) x5   Pt requires the buoyancy and hydrostatic pressure of  water for support, and to offload joints by unweighting joint load by at least 50 % in navel deep water and by at least 75-80% in chest to neck deep water.  Viscosity of the water is needed for resistance of strengthening. Water current perturbations provides challenge to standing balance requiring increased core activation.  Manual: PA and AP glides; patella mobilization with instruction on indep completion. ROM measurement left knee:5-116    [facility] Adult PT Treatment:                                                DATE:  2/9 Manual: PA and AP glides; patella mobilization.   SLR: 2x10  Bridge 2x10 with green band   Standing:  Gastroc stretch 3x20 sec hold   Step  up 4 inch 2x10  Lateral step up 4 inch 2x10     06/05/22 Pt seen for aquatic therapy today.  Treatment took place in water 3.25-4.5 ft in depth at the [facility]. Temp of water was 91.  Pt entered/exited the pool via stairs independently with bilat rail.  * gait unsupported:  forward/ backward  *standing leaning on wall :IT band stretch *Standing ue support on wall 4.0 ft hip circles CW & ccw; solid noodle kick downs  hip in neutral then ER x10 R/L ue support yellow hand buoys for added core firing and ankle strengthening *Forward and back amb with hand buoys submerged *Lateral step ups leading R and L x 12 *VMO strengthening/forward step downs x 10 *STS from 3 (bottom) step x 10 gaining immediate standing balance 9/10 x. *adductor sets using BB *STS from 3rd step with add set x 5 *cycling on yellow noodle   Pt requires the buoyancy and hydrostatic pressure of water for support, and to offload joints by unweighting joint load by at least 50 % in navel deep water and by at least 75-80% in chest to neck deep water.  Viscosity of the water is needed for resistance of strengthening. Water current perturbations provides challenge to standing balance requiring increased core activation.   05/30/22 Therapeutic  Exercise: Sitting hamstring stretch 2x30 sec Sitting adductor stretch 2x30 sec Sup ITB stretch with strap 2x30 sec Prone quad stretch with strap 2x30 sec Sup hip flexor stretch x2 min Gait: amb with heel strike and glute setting during stance phase Sit<>stand green TB around knees 2x10 Side step green TB 2x10 Gait training: Amb with heel strike decreased trunk lean, glute and quad setting with stance phase   [facility] Adult PT Treatment:                                                DATE: 05/28/22 Pt seen for aquatic therapy today.  Treatment took place in water 3.25-4.5 ft in depth at the [facility]. Temp of water was 91.  Pt entered/exited the pool via stairs independently with bilat rail. * gait unsupported:  forward/ backward focused on heel strike, toe off and allowing Lt knee to flex during swing through *Forward and back amb with hand buoys submerged *side stepping yellow hand buoys submerged to side x 2 widths then side lunges with ue add/abd x 2 widths * TrA set : pushing solid blue noodle to knees Le staggered then wide stance x8 in each position * Holding solid blue noodle :1/2 diamonds, curtsy lunges *UE support on wall: minis-squats. Cues and demonstration for proper execution weight through heels  *standing leaning on wall hamstring, gastroc, adductor and IT band stretch using noodle under ankle *solid noodle kick down  hip in neutral then ER x10 R/L   Pt requires the buoyancy and hydrostatic pressure of water for support, and to offload joints by unweighting joint load by at least 50 % in navel deep water and by at least 75-80% in chest to neck deep water.  Viscosity of the water is needed for resistance of strengthening. Water current perturbations provides challenge to standing balance requiring increased core activation.    [facility] Adult PT Treatment:  DATE: 05/25/22 Pt seen for aquatic therapy today.  Treatment took  place in water 3.25-4.5 ft in depth at the [facility]. Temp of water was 91.  Pt entered/exited the pool via stairs independently with bilat rail. * gait unsupported:  forward/ backward focused on heel strike, toe off and allowing Lt knee to flex during swing through *  side stepping with hydro band above knees - adjusting step length and speed * TrA set with gentle press into yellow noodle x 5 sec hold x 10; then with added slow high knee marching * Holding wall : single leg clams (with limited height of foot on leg); curtsy lunges;  * standing with foot on bench in water: hamstring stretch x 20s; seated on bench -hamstring stretch x 20s  Pt requires the buoyancy and hydrostatic pressure of water for support, and to offload joints by unweighting joint load by at least 50 % in navel deep water and by at least 75-80% in chest to neck deep water.  Viscosity of the water is needed for resistance of strengthening. Water current perturbations provides challenge to standing balance requiring increased core activation.    [facility] Adult PT Treatment:                                                DATE: 05/23/22 Therapeutic Exercise: Nustep L4 x 5 min LEs only Reviewed HEP Marching with ab set 2x10 ITB stretch 2x30 sec Sit<>stand red TB around knees 2x10 Side step red TB around knees 3x15' Standing ITB stretch x30 sec Captain Morgan's 10x5 sec Manual Therapy: IASTM with massage roller along ITB Gait training: Amb with heel strike decreased trunk lean, glute and quad setting with stance phase   [facility] Adult PT Treatment:                                                DATE: 05/16/22 Therapeutic Exercise: Nustep L5 x 5 min UEs/LEs Sitting Hamstring stretch 2x30 sec Quad set 2x10x3 sec SLR x10 SLR + ER x10 Supine ITB stretch with strap x 30 sec Bridging 2x10x3 sec Heel slide 2x10x3 sec Marching 2x10 Knee to chest on L 2x30 sec with hamstring curl but also uncomfortable for  pt Sidelying Clamshell red TB 2x10 Attempted quadruped hip/knee flexion but too much for pt Manual Therapy: Grade II to III inferior and lateral hip mobilization                                                                                                                              DATE: 05/09/22 See HEP  Has been doing the following from prior PT exercises: SLR (has extension lag) Ankle pumps  Ankle ABCs Heel slides with strap  PATIENT EDUCATION:  Education details: Findings, initial HEP, POC Person educated: Patient Education method: Explanation, Demonstration, and Handouts Education comprehension: verbalized understanding, returned demonstration, and needs further education  HOME EXERCISE PROGRAM: Access Code: [email or web address] URL: [email or web address] Date: 05/16/2022 Prepared by: Estill Bamberg April Thurnell Garbe  Exercises - Seated Hamstring Stretch  - 2 x daily - 7 x weekly - 2 sets - 30 sec hold - Seated Quad Set  - 2 x daily - 7 x weekly - 2 sets - 10 reps - 3 sec hold - Supine ITB Stretch with Strap  - 1 x daily - 7 x weekly - 2 sets - 30 sec hold - Clamshell with Resistance  - 1 x daily - 7 x weekly - 2 sets - 10 reps - Supine Heel Slide with Strap  - 1 x daily - 7 x weekly - 2 sets - 10 reps - Seated Straight Leg Raise with Support  - 1 x daily - 7 x weekly - 2 sets - 10 reps - Supine March  - 1 x daily - 7 x weekly - 2 sets - 10 reps - Supine Bridge  - 1 x daily - 7 x weekly - 2 sets - 10 reps - 3 sec hold  ASSESSMENT:  CLINICAL IMPRESSION: Pt with good response to manual patellar mobs completed last session. She is instructed today on indep completion. No pain with session. Added focus on proprioceptive and balance retraining which she executes with only minor difficulty. Left knee Rom improved and STGs addressed/met  The patient is making good progress. We performed a trial of manual therapy to the left knee. We also work on IT trainer. Therapy will  continue to progress as tolerated.    OBJECTIVE IMPAIRMENTS: Abnormal gait, decreased balance, decreased endurance, decreased mobility, difficulty walking, decreased ROM, decreased strength, hypomobility, increased fascial restrictions, impaired flexibility, postural dysfunction, and pain.    GOALS: Goals reviewed with patient? Yes  SHORT TERM GOALS: Target date: 06/07/2022  Pt will be ind with initial HEP Baseline: Goal status: Met 06/12/22  2.  Pt will demo L knee ROM >/= 5-105 deg for improved stair ascent/descent Baseline:  Goal status: Met 06/12/22    LONG TERM GOALS: Target date: 07/05/2022   Pt will be ind with progression and advancement of HEP Baseline:  Goal status: INITIAL  2.  Pt will demo L knee ROM of >/= 5 - 115 deg for improved stair/hill ascent/descent Baseline:  Goal status: INITIAL  3.  Pt will be able to demo R & L SLS of >/=10 sec to show improved bilat LE stability Baseline: <5 sec Goal status: INITIAL  4.  Pt will be able to squat at least 20# lower than 70 deg of knee flexion to demo improved functional LE strength for lifting and carrying Baseline: Currently performs with compensation and pain Goal status: INITIAL  5.  Pt will demo normal reciprocal gait pattern without compensated trunk lean x1000' for community mobility Baseline:  Goal status: INITIAL  6.  Pt will have improved [name] score to >/=73 Baseline:  Goal status: INITIAL   PLAN:  PT FREQUENCY: 2x/week  PT DURATION: 8 weeks  PLANNED INTERVENTIONS: Therapeutic exercises, Therapeutic activity, Neuromuscular re-education, Balance training, Gait training, Patient/Family education, Self Care, Joint mobilization, Stair training, Orthotic/Fit training, Aquatic Therapy, Dry Needling, Electrical stimulation, Cryotherapy, Moist heat, Taping, Vasopneumatic device, Ultrasound, Ionotophoresis 70m/ml Dexamethasone, Manual therapy, and Re-evaluation  PLAN FOR NEXT SESSION: Assess response to  HEP.  Work on knee ROM. Quad and glute stretching and strengthening. Progress to closed chain exercises as tolerated.   Stanton Kidney [place]) Kemet Nijjar MPT 06/12/22 [address] [facility] [address] [place], [place], [address] Phone: [phone]   Fax:  [phone]

## 2022-06-14 ENCOUNTER — Ambulatory Visit (HOSPITAL_BASED_OUTPATIENT_CLINIC_OR_DEPARTMENT_OTHER): Payer: Medicare PPO | Admitting: Physical Therapy

## 2022-06-15 ENCOUNTER — Encounter (HOSPITAL_BASED_OUTPATIENT_CLINIC_OR_DEPARTMENT_OTHER): Payer: Medicare PPO | Admitting: Physical Therapy

## 2022-06-19 ENCOUNTER — Ambulatory Visit (HOSPITAL_BASED_OUTPATIENT_CLINIC_OR_DEPARTMENT_OTHER): Payer: Medicare PPO | Admitting: Physical Therapy

## 2022-06-19 ENCOUNTER — Encounter (HOSPITAL_BASED_OUTPATIENT_CLINIC_OR_DEPARTMENT_OTHER): Payer: Self-pay | Admitting: Physical Therapy

## 2022-06-19 DIAGNOSIS — M25662 Stiffness of left knee, not elsewhere classified: Secondary | ICD-10-CM

## 2022-06-19 DIAGNOSIS — M25562 Pain in left knee: Secondary | ICD-10-CM | POA: Diagnosis not present

## 2022-06-19 DIAGNOSIS — M6281 Muscle weakness (generalized): Secondary | ICD-10-CM

## 2022-06-19 DIAGNOSIS — R2689 Other abnormalities of gait and mobility: Secondary | ICD-10-CM

## 2022-06-19 DIAGNOSIS — G8929 Other chronic pain: Secondary | ICD-10-CM

## 2022-06-19 NOTE — Therapy (Signed)
OUTPATIENT PHYSICAL THERAPY TREATMENT Progress Note Reporting Period 05/09/22 to 06/19/22  See note below for Objective Data and Assessment of Progress/Goals.      Patient Name: Jaime Rose MRN: [id number] DOB:Mar 22, 1951, 72 y.o., female Today's Date: 06/19/2022  END OF SESSION:  PT End of Session - 06/19/22 1807     Visit Number 10    Number of Visits 16    Date for PT Re-Evaluation 07/04/22    Authorization Type [employer]    PT Start Time 1619    PT Stop Time 1700    PT Time Calculation (min) 41 min    Activity Tolerance Patient tolerated treatment well    Behavior During Therapy WFL for tasks assessed/performed               Past Medical History:  Diagnosis Date   ALLERGIC RHINITIS    [place], HX OF 12/21/2009   Qualifier: Diagnosis of  By: Asa Lente MD, Valerie A    Hyperthyroidism    normal TFTs 11/2009 , dx 10/2011   Varicose vein    s/p sclerosis tx summer 2013   Past Surgical History:  Procedure Laterality Date   ENDOVENOUS ABLATION SAPHENOUS VEIN W/ LASER  04-11-2012   endovenous laser ablation right greater saphenous vein and stab phlebectomy  right leg  by Curt Jews MD   Patient Active Problem List   Diagnosis Date Noted   Foot pain, left 06/02/2021   Ptosis of right eyelid 04/12/2020   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 12/29/2018   Osteopenia 01/10/2017   Right carotid bruit 12/21/2016   Prediabetes 12/21/2016   Hypothyroidism 03/20/2012   Varicose veins of lower extremities with other complications [id number]   ALLERGIC RHINITIS 12/21/2009   Cardiac murmur 12/21/2009    PCP: Billey Gosling  REFERRING PROVIDER: Vanetta Mulders, MD  REFERRING DIAG: [phone] (ICD-10-CM) - Unilateral primary osteoarthritis, left knee  THERAPY DIAG:  Chronic pain of left knee  Stiffness of left knee, not elsewhere classified  Muscle weakness (generalized)  Other abnormalities of gait and mobility  Rationale for Evaluation and Treatment:  Rehabilitation  ONSET DATE: Multiple years  SUBJECTIVE:   SUBJECTIVE STATEMENT: "I found over the weekend that if I stand straighter my knee won't hurt" " pain doesn't get any worse than a 4/10  PERTINENT HISTORY: From PT order: Possible aquatic candidate  Hip girdle strengthening and LE strengthening for OA From eval: Pt states her knee has been a problem for years. Pt notes that knee was exacerbated in Feb when her foot was swollen (cause is unknown). Pt points to pain primarily along the lateral knee. Pt states it's not excruciating but if she exerts herself she can feel the pain (I.e. going to [place] and going up/down the stairs and hills). Pt with plans to go to [place] in April. It does get stiff over night. Pt states she is doing HEP for her foot/ankle that she was given by PT.   PAIN:  Are you having pain? no: NPRS scale: 0/10, "just discomfort" Pain location: L anterior knee joint line Pain description: Dull/achy Aggravating factors: Knee bending, descending stairs Relieving factors: Ibuprofen,   PRECAUTIONS: None  WEIGHT BEARING RESTRICTIONS: No  FALLS:  Has patient fallen in last 6 months? No  LIVING ENVIRONMENT: Lives with: lives alone Lives in: House/apartment Stairs: Yes: Internal: 14-15 steps; can reach both and External: 4-5 steps; can reach both Has following equipment at home: None  OCCUPATION: Nutritionist  PATIENT GOALS: Decrease the "waddle", go up/down  stairs   NEXT MD VISIT:   OBJECTIVE:   DIAGNOSTIC FINDINGS: x-ray performed 05/09/22 -- results pending.  PATIENT SURVEYS:  [name] 65; predicted 73  MUSCLE LENGTH: Hamstrings: Right 80 deg; Left <60 deg (likely due to reduced knee ROM) Thomas test: WFL  POSTURE:   Increased knee valgus with Q angle >20 deg on L (greater in standing than supine). Patellar malalignment also noted -- appears slightly lateral to center in supine. R iliac crest higher than L in standing  PALPATION: TTP L lateral knee  and glute med. Hypomobile patellar mobility  LOWER EXTREMITY ROM:  Active ROM Right eval Left eval Left 1/31 Left 06/12/20  Knee flexion 120 100 N/A 116 Seated  Knee extension 0 -8 -5 -5   (Blank rows = not tested)  LOWER EXTREMITY MMT:  MMT Right eval Left eval Right / Left 06/19/22  Hip flexion 4+ 4- 5- / 4  Hip extension 3+ 3 4- / 3+  Hip abduction 4- 3 4 / 3+  Hip adduction     Hip internal rotation     Hip external rotation     Knee flexion 5 5   Knee extension 5 5 (at 10 deg knee flex)   Ankle dorsiflexion     Ankle plantarflexion     Ankle inversion     Ankle eversion      (Blank rows = not tested)  LOWER EXTREMITY SPECIAL TESTS:  Hip special tests: [name] (FABER) test: negative, Trendelenburg test: positive , Ober's test: positive , and Ely's test: negative  FUNCTIONAL TESTS:  SLS: 5 sec R, 1 sec L Squatting demos increased L knee valgus, R weight shift, decreased knee flexion to ~60 deg -- pt compensates into a lunge if she has to squat lower Stair descent demos poor eccentric quad strength with increased valgus  06/19/22: SLS 8 sec R, 4 sec L  GAIT: Distance walked: 100' Assistive device utilized: None Level of assistance: Complete Independence Comments: L foot externally rotated, knee slightly flexed during stance, antalgic, compensated trendelenburg with trunk lean during stance phase   [facility] Adult PT Treatment:     06/19/22 Pt seen for aquatic therapy today.  Treatment took place in water 3.25-4.5 ft in depth at the [place]. Temp of water was 91.  Pt entered/exited the pool via stairs independently with bilat rail.  * gait unsupported:  forward/ backward  *solid noodle kick downs  hip in neutral then ER x10 R/L ue support yellow hand buoys for added core firing and ankle strengthening *Forward and back amb with hand buoys submerged *Hip hiking 2x10 bottom step *hip hinges. Cues for target ms engagement of gluts and hamstring *L  stretch x 5 with tail wagging last reps *adductor sets using BB *STS from 3 (bottom) step x 5 with adductor set using BB 5/5 no LOB *STS from 4th step with add set x 5/5 no LOB *Step ups leading R/L x 5 onto bottom step without UE supirt; x 5 onto 2nd step with ue support for balance  Pt requires the buoyancy and hydrostatic pressure of water for support, and to offload joints by unweighting joint load by at least 50 % in navel deep water and by at least 75-80% in chest to neck deep water.  Viscosity of the water is needed for resistance of strengthening. Water current perturbations provides challenge to standing balance requiring increased core activation.   06/12/22 Pt seen for aquatic therapy today.  Treatment took place in water  3.25-4.5 ft in depth at the [facility] pool. Temp of water was 91.  Pt entered/exited the pool via stairs independently with bilat rail.  * gait unsupported:  forward/ backward  *Lateral step ups leading R and L x 12 *Standing ue support on wall: curtsy squats; solid noodle kick downs hip in neutral then ER then neutral x12 R/L unsupported. Cues for SLS, abdominal bracing *Hip hinges with LB stretch x 10.  Cues for target muscle/execution as well as demonstration *Forward jogging x 6 widths *Forward skipping x 4 widths *stair tapping Balance *STS from 3 (bottom) step x 10 gaining immediate standing balance 9/10 x. *adductor sets using BB *STS from 3rd step with add set x 10; from 4th (bottom/less submerged) x5   Pt requires the buoyancy and hydrostatic pressure of water for support, and to offload joints by unweighting joint load by at least 50 % in navel deep water and by at least 75-80% in chest to neck deep water.  Viscosity of the water is needed for resistance of strengthening. Water current perturbations provides challenge to standing balance requiring increased core activation.  Manual: PA and AP glides; patella mobilization with instruction on  indep completion. ROM measurement left knee:5-116    [facility] Adult PT Treatment:                                                DATE:  2/9 Manual: PA and AP glides; patella mobilization.   SLR: 2x10  Bridge 2x10 with green band   Standing:  Gastroc stretch 3x20 sec hold   Step up 4 inch 2x10  Lateral step up 4 inch 2x10      06/05/22 Pt seen for aquatic therapy today.  Treatment took place in water 3.25-4.5 ft in depth at the [facility]. Temp of water was 91.  Pt entered/exited the pool via stairs independently with bilat rail.  * gait unsupported:  forward/ backward  *standing ue support yellow hand buoys *standing leaning on wall :IT band stretch *Standing ue support on wall 4.0 ft hip circles CW & ccw; solid noodle kick downs  hip in neutral then ER x10 R/L ue support yellow hand buoys for added core firing and ankle strengthening *Forward and back amb with hand buoys submerged *Lateral step ups leading R and L x 12 *VMO strengthening/forward step downs x 10 *STS from 3 (bottom) step x 10 gaining immediate standing balance 9/10 x. *adductor sets using BB *STS from 3rd step with add set x 5 *cycling on yellow noodle   Pt requires the buoyancy and hydrostatic pressure of water for support, and to offload joints by unweighting joint load by at least 50 % in navel deep water and by at least 75-80% in chest to neck deep water.  Viscosity of the water is needed for resistance of strengthening. Water current perturbations provides challenge to standing balance requiring increased core activation.   05/30/22 Therapeutic Exercise: Sitting hamstring stretch 2x30 sec Sitting adductor stretch 2x30 sec Sup ITB stretch with strap 2x30 sec Prone quad stretch with strap 2x30 sec Sup hip flexor stretch x2 min Gait: amb with heel strike and glute setting during stance phase Sit<>stand green TB around knees 2x10 Side step green TB 2x10 Gait training: Amb with heel strike  decreased trunk lean, glute and quad setting with stance phase  [facility] Adult PT Treatment:                                                DATE: 05/28/22 Pt seen for aquatic therapy today.  Treatment took place in water 3.25-4.5 ft in depth at the [facility]. Temp of water was 91.  Pt entered/exited the pool via stairs independently with bilat rail. * gait unsupported:  forward/ backward focused on heel strike, toe off and allowing Lt knee to flex during swing through *Forward and back amb with hand buoys submerged *side stepping yellow hand buoys submerged to side x 2 widths then side lunges with ue add/abd x 2 widths * TrA set : pushing solid blue noodle to knees Le staggered then wide stance x8 in each position * Holding solid blue noodle :1/2 diamonds, curtsy lunges *UE support on wall: minis-squats. Cues and demonstration for proper execution weight through heels  *standing leaning on wall hamstring, gastroc, adductor and IT band stretch using noodle under ankle *solid noodle kick down  hip in neutral then ER x10 R/L   Pt requires the buoyancy and hydrostatic pressure of water for support, and to offload joints by unweighting joint load by at least 50 % in navel deep water and by at least 75-80% in chest to neck deep water.  Viscosity of the water is needed for resistance of strengthening. Water current perturbations provides challenge to standing balance requiring increased core activation.    [facility] Adult PT Treatment:                                                DATE: 05/25/22 Pt seen for aquatic therapy today.  Treatment took place in water 3.25-4.5 ft in depth at the [place]. Temp of water was 91.  Pt entered/exited the pool via stairs independently with bilat rail. * gait unsupported:  forward/ backward focused on heel strike, toe off and allowing Lt knee to flex during swing through *  side stepping with hydro band above knees - adjusting step length  and speed * TrA set with gentle press into yellow noodle x 5 sec hold x 10; then with added slow high knee marching * Holding wall : single leg clams (with limited height of foot on leg); curtsy lunges;  * standing with foot on bench in water: hamstring stretch x 20s; seated on bench -hamstring stretch x 20s  Pt requires the buoyancy and hydrostatic pressure of water for support, and to offload joints by unweighting joint load by at least 50 % in navel deep water and by at least 75-80% in chest to neck deep water.  Viscosity of the water is needed for resistance of strengthening. Water current perturbations provides challenge to standing balance requiring increased core activation.    [facility] Adult PT Treatment:                                                DATE: 05/23/22 Therapeutic Exercise: Nustep L4 x 5 min LEs only Reviewed HEP Marching with ab set 2x10 ITB stretch 2x30 sec  Sit<>stand red TB around knees 2x10 Side step red TB around knees 3x15' Standing ITB stretch x30 sec Captain Morgan's 10x5 sec Manual Therapy: IASTM with massage roller along ITB Gait training: Amb with heel strike decreased trunk lean, glute and quad setting with stance phase   [facility] Adult PT Treatment:                                                DATE: 05/16/22 Therapeutic Exercise: Nustep L5 x 5 min UEs/LEs Sitting Hamstring stretch 2x30 sec Quad set 2x10x3 sec SLR x10 SLR + ER x10 Supine ITB stretch with strap x 30 sec Bridging 2x10x3 sec Heel slide 2x10x3 sec Marching 2x10 Knee to chest on L 2x30 sec with hamstring curl but also uncomfortable for pt Sidelying Clamshell red TB 2x10 Attempted quadruped hip/knee flexion but too much for pt Manual Therapy: Grade II to III inferior and lateral hip mobilization                                                                                                                              DATE: 05/09/22 See HEP  Has been doing the following from prior PT  exercises: SLR (has extension lag) Ankle pumps Ankle ABCs Heel slides with strap  PATIENT EDUCATION:  Education details: Findings, initial HEP, POC Person educated: Patient Education method: Explanation, Demonstration, and Handouts Education comprehension: verbalized understanding, returned demonstration, and needs further education  HOME EXERCISE PROGRAM: Access Code: [email or web address] URL: [email or web address] Date: 05/16/2022 Prepared by: Estill Bamberg April Thurnell Garbe  Exercises - Seated Hamstring Stretch  - 2 x daily - 7 x weekly - 2 sets - 30 sec hold - Seated Quad Set  - 2 x daily - 7 x weekly - 2 sets - 10 reps - 3 sec hold - Supine ITB Stretch with Strap  - 1 x daily - 7 x weekly - 2 sets - 30 sec hold - Clamshell with Resistance  - 1 x daily - 7 x weekly - 2 sets - 10 reps - Supine Heel Slide with Strap  - 1 x daily - 7 x weekly - 2 sets - 10 reps - Seated Straight Leg Raise with Support  - 1 x daily - 7 x weekly - 2 sets - 10 reps - Supine March  - 1 x daily - 7 x weekly - 2 sets - 10 reps - Supine Bridge  - 1 x daily - 7 x weekly - 2 sets - 10 reps - 3 sec hold  ASSESSMENT:  CLINICAL IMPRESSION: PN:Pt reports improving knee pain. She has increased awareness of posture and positioning with amb. She continues with hip weakness as evident with continued trendelenburg.She has improved SLS times, improvement in ROM of L knee and Her strength is slowly improving. She will continue to benefit from  skilled physical therapy aquatic and land based to continue progressing toward meeting goals.  Focus today  on hip, pelvic and le strength.    OBJECTIVE IMPAIRMENTS: Abnormal gait, decreased balance, decreased endurance, decreased mobility, difficulty walking, decreased ROM, decreased strength, hypomobility, increased fascial restrictions, impaired flexibility, postural dysfunction, and pain.    GOALS: Goals reviewed with patient? Yes  SHORT TERM GOALS: Target date:  06/07/2022  Pt will be ind with initial HEP Baseline: Goal status: Met 06/12/22  2.  Pt will demo L knee ROM >/= 5-105 deg for improved stair ascent/descent Baseline:  Goal status: Met 06/12/22    LONG TERM GOALS: Target date: 07/05/2022   Pt will be ind with progression and advancement of HEP Baseline:  Goal status: INITIAL  2.  Pt will demo L knee ROM of >/= 5 - 115 deg for improved stair/hill ascent/descent Baseline:  Goal status: INITIAL  3.  Pt will be able to demo R & L SLS of >/=10 sec to show improved bilat LE stability Baseline: <5 sec Goal status: INITIAL  4.  Pt will be able to squat at least 20# lower than 70 deg of knee flexion to demo improved functional LE strength for lifting and carrying Baseline: Currently performs with compensation and pain Goal status: INITIAL  5.  Pt will demo normal reciprocal gait pattern without compensated trunk lean x1000' for community mobility Baseline:  Goal status: INITIAL  6.  Pt will have improved [name] score to >/=73 Baseline:  Goal status: INITIAL   PLAN:  PT FREQUENCY: 2x/week  PT DURATION: 8 weeks  PLANNED INTERVENTIONS: Therapeutic exercises, Therapeutic activity, Neuromuscular re-education, Balance training, Gait training, Patient/Family education, Self Care, Joint mobilization, Stair training, Orthotic/Fit training, Aquatic Therapy, Dry Needling, Electrical stimulation, Cryotherapy, Moist heat, Taping, Vasopneumatic device, Ultrasound, Ionotophoresis 33m/ml Dexamethasone, Manual therapy, and Re-evaluation  PLAN FOR NEXT SESSION: Complete foto. Work on knee ROM. Quad and glute stretching and strengthening. Progress to closed chain exercises as tolerated.   MStanton Kidney(Colp Marshall Kampf MPT 06/12/22 459p CThe Rehabilitation Institute Of St. Louis351 Center StreetGRockwall NAlaska [address]Phone: [phone]  Fax:  [phone]

## 2022-06-22 ENCOUNTER — Ambulatory Visit (HOSPITAL_BASED_OUTPATIENT_CLINIC_OR_DEPARTMENT_OTHER): Payer: Medicare PPO | Admitting: Physical Therapy

## 2022-06-22 ENCOUNTER — Encounter (HOSPITAL_BASED_OUTPATIENT_CLINIC_OR_DEPARTMENT_OTHER): Payer: Self-pay | Admitting: Physical Therapy

## 2022-06-22 DIAGNOSIS — M6281 Muscle weakness (generalized): Secondary | ICD-10-CM

## 2022-06-22 DIAGNOSIS — M25662 Stiffness of left knee, not elsewhere classified: Secondary | ICD-10-CM

## 2022-06-22 DIAGNOSIS — R2689 Other abnormalities of gait and mobility: Secondary | ICD-10-CM

## 2022-06-22 DIAGNOSIS — M25562 Pain in left knee: Secondary | ICD-10-CM | POA: Diagnosis not present

## 2022-06-22 DIAGNOSIS — G8929 Other chronic pain: Secondary | ICD-10-CM

## 2022-06-22 NOTE — Therapy (Signed)
OUTPATIENT PHYSICAL THERAPY TREATMENT Progress Note Reporting Period 05/09/22 to 06/19/22  See note below for Objective Data and Assessment of Progress/Goals.      Patient Name: Jaime Rose MRN: [id number] DOB:1950-07-05, 72 y.o., female Today's Date: 06/19/2022  END OF SESSION:  PT End of Session - 06/19/22 1807     Visit Number 10    Number of Visits 16    Date for PT Re-Evaluation 07/04/22    Authorization Type [employer]    PT Start Time 1619    PT Stop Time 1700    PT Time Calculation (min) 41 min    Activity Tolerance Patient tolerated treatment well    Behavior During Therapy WFL for tasks assessed/performed               Past Medical History:  Diagnosis Date   ALLERGIC RHINITIS    [place], HX OF 12/21/2009   Qualifier: Diagnosis of  By: Asa Lente MD, Valerie A    Hyperthyroidism    normal TFTs 11/2009 , dx 10/2011   Varicose vein    s/p sclerosis tx summer 2013   Past Surgical History:  Procedure Laterality Date   ENDOVENOUS ABLATION SAPHENOUS VEIN W/ LASER  04-11-2012   endovenous laser ablation right greater saphenous vein and stab phlebectomy  right leg  by Curt Jews MD   Patient Active Problem List   Diagnosis Date Noted   Foot pain, left 06/02/2021   Ptosis of right eyelid 04/12/2020   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 12/29/2018   Osteopenia 01/10/2017   Right carotid bruit 12/21/2016   Prediabetes 12/21/2016   Hypothyroidism 03/20/2012   Varicose veins of lower extremities with other complications [id number]   ALLERGIC RHINITIS 12/21/2009   Cardiac murmur 12/21/2009    PCP: Billey Gosling  REFERRING PROVIDER: Vanetta Mulders, MD  REFERRING DIAG: [phone] (ICD-10-CM) - Unilateral primary osteoarthritis, left knee  THERAPY DIAG:  Chronic pain of left knee  Stiffness of left knee, not elsewhere classified  Muscle weakness (generalized)  Other abnormalities of gait and mobility  Rationale for Evaluation and Treatment:  Rehabilitation  ONSET DATE: Multiple years  SUBJECTIVE:   SUBJECTIVE STATEMENT: "I found over the weekend that if I stand straighter my knee won't hurt" " pain doesn't get any worse than a 4/10  PERTINENT HISTORY: From PT order: Possible aquatic candidate  Hip girdle strengthening and LE strengthening for OA From eval: Pt states her knee has been a problem for years. Pt notes that knee was exacerbated in Feb when her foot was swollen (cause is unknown). Pt points to pain primarily along the lateral knee. Pt states it's not excruciating but if she exerts herself she can feel the pain (I.e. going to [place] and going up/down the stairs and hills). Pt with plans to go to [place] in April. It does get stiff over night. Pt states she is doing HEP for her foot/ankle that she was given by PT.   PAIN:  Are you having pain? no: NPRS scale: 0/10, "just discomfort" Pain location: L anterior knee joint line Pain description: Dull/achy Aggravating factors: Knee bending, descending stairs Relieving factors: Ibuprofen,   PRECAUTIONS: None  WEIGHT BEARING RESTRICTIONS: No  FALLS:  Has patient fallen in last 6 months? No  LIVING ENVIRONMENT: Lives with: lives alone Lives in: House/apartment Stairs: Yes: Internal: 14-15 steps; can reach both and External: 4-5 steps; can reach both Has following equipment at home: None  OCCUPATION: Nutritionist  PATIENT GOALS: Decrease the "waddle", go up/down  stairs   NEXT MD VISIT:   OBJECTIVE:   DIAGNOSTIC FINDINGS: x-ray performed 05/09/22 -- results pending.  PATIENT SURVEYS:  [name] 65; predicted 73  MUSCLE LENGTH: Hamstrings: Right 80 deg; Left <60 deg (likely due to reduced knee ROM) Thomas test: WFL  POSTURE:   Increased knee valgus with Q angle >20 deg on L (greater in standing than supine). Patellar malalignment also noted -- appears slightly lateral to center in supine. R iliac crest higher than L in standing  PALPATION: TTP L lateral knee  and glute med. Hypomobile patellar mobility  LOWER EXTREMITY ROM:  Active ROM Right eval Left eval Left 1/31 Left 06/12/20  Knee flexion 120 100 N/A 116 Seated  Knee extension 0 -8 -5 -5   (Blank rows = not tested)  LOWER EXTREMITY MMT:  MMT Right eval Left eval Right / Left 06/19/22  Hip flexion 4+ 4- 5- / 4  Hip extension 3+ 3 4- / 3+  Hip abduction 4- 3 4 / 3+  Hip adduction     Hip internal rotation     Hip external rotation     Knee flexion 5 5   Knee extension 5 5 (at 10 deg knee flex)   Ankle dorsiflexion     Ankle plantarflexion     Ankle inversion     Ankle eversion      (Blank rows = not tested)  LOWER EXTREMITY SPECIAL TESTS:  Hip special tests: [name] (FABER) test: negative, Trendelenburg test: positive , Ober's test: positive , and Ely's test: negative  FUNCTIONAL TESTS:  SLS: 5 sec R, 1 sec L Squatting demos increased L knee valgus, R weight shift, decreased knee flexion to ~60 deg -- pt compensates into a lunge if she has to squat lower Stair descent demos poor eccentric quad strength with increased valgus  06/19/22: SLS 8 sec R, 4 sec L  GAIT: Distance walked: 100' Assistive device utilized: None Level of assistance: Complete Independence Comments: L foot externally rotated, knee slightly flexed during stance, antalgic, compensated trendelenburg with trunk lean during stance phase   [facility] Adult PT Treatment:     2/30 2/9 Manual: PA and AP glides; patella mobilization.   SLR: 2x10  Bridge 2x10 with green band   Standing:  Reviewed self IT band stretch including use of a roller and   Step up 4 inch 2x10  Lateral step up 4 inch 2x10  Step down 2 inch with control 2x10       06/19/22 Pt seen for aquatic therapy today.  Treatment took place in water 3.25-4.5 ft in depth at the [facility]. Temp of water was 91.  Pt entered/exited the pool via stairs independently with bilat rail.  * gait unsupported:  forward/ backward   *solid noodle kick downs  hip in neutral then ER x10 R/L ue support yellow hand buoys for added core firing and ankle strengthening *Forward and back amb with hand buoys submerged *Hip hiking 2x10 bottom step *hip hinges. Cues for target ms engagement of gluts and hamstring *L stretch x 5 with tail wagging last reps *adductor sets using BB *STS from 3 (bottom) step x 5 with adductor set using BB 5/5 no LOB *STS from 4th step with add set x 5/5 no LOB *Step ups leading R/L x 5 onto bottom step without UE supirt; x 5 onto 2nd step with ue support for balance  Pt requires the buoyancy and hydrostatic pressure of water for support, and to offload joints  by unweighting joint load by at least 50 % in navel deep water and by at least 75-80% in chest to neck deep water.  Viscosity of the water is needed for resistance of strengthening. Water current perturbations provides challenge to standing balance requiring increased core activation.   06/12/22 Pt seen for aquatic therapy today.  Treatment took place in water 3.25-4.5 ft in depth at the [facility]. Temp of water was 91.  Pt entered/exited the pool via stairs independently with bilat rail.  * gait unsupported:  forward/ backward  *Lateral step ups leading R and L x 12 *Standing ue support on wall: curtsy squats; solid noodle kick downs hip in neutral then ER then neutral x12 R/L unsupported. Cues for SLS, abdominal bracing *Hip hinges with LB stretch x 10.  Cues for target muscle/execution as well as demonstration *Forward jogging x 6 widths *Forward skipping x 4 widths *stair tapping Balance *STS from 3 (bottom) step x 10 gaining immediate standing balance 9/10 x. *adductor sets using BB *STS from 3rd step with add set x 10; from 4th (bottom/less submerged) x5   Pt requires the buoyancy and hydrostatic pressure of water for support, and to offload joints by unweighting joint load by at least 50 % in navel deep water and by at  least 75-80% in chest to neck deep water.  Viscosity of the water is needed for resistance of strengthening. Water current perturbations provides challenge to standing balance requiring increased core activation.  Manual: PA and AP glides; patella mobilization with instruction on indep completion. ROM measurement left knee:5-116    [facility] Adult PT Treatment:                                                DATE:  2/9 Manual: PA and AP glides; patella mobilization.   SLR: 2x10  Bridge 2x10 with green band   Standing:  Gastroc stretch 3x20 sec hold   Step up 4 inch 2x10  Lateral step up 4 inch 2x10      06/05/22 Pt seen for aquatic therapy today.  Treatment took place in water 3.25-4.5 ft in depth at the [place] [facility]. Temp of water was 91.  Pt entered/exited the pool via stairs independently with bilat rail.  * gait unsupported:  forward/ backward  *standing ue support yellow hand buoys *standing leaning on wall :IT band stretch *Standing ue support on wall 4.0 ft hip circles CW & ccw; solid noodle kick downs  hip in neutral then ER x10 R/L ue support yellow hand buoys for added core firing and ankle strengthening *Forward and back amb with hand buoys submerged *Lateral step ups leading R and L x 12 *VMO strengthening/forward step downs x 10 *STS from 3 (bottom) step x 10 gaining immediate standing balance 9/10 x. *adductor sets using BB *STS from 3rd step with add set x 5 *cycling on yellow noodle   Pt requires the buoyancy and hydrostatic pressure of water for support, and to offload joints by unweighting joint load by at least 50 % in navel deep water and by at least 75-80% in chest to neck deep water.  Viscosity of the water is needed for resistance of strengthening. Water current perturbations provides challenge to standing balance requiring increased core activation.   05/30/22 Therapeutic Exercise: Sitting hamstring stretch 2x30 sec Sitting adductor stretch  2x30 sec Sup ITB stretch with strap 2x30 sec Prone quad stretch with strap 2x30 sec Sup hip flexor stretch x2 min Gait: amb with heel strike and glute setting during stance phase Sit<>stand green TB around knees 2x10 Side step green TB 2x10 Gait training: Amb with heel strike decreased trunk lean, glute and quad setting with stance phase   [facility] Adult PT Treatment:                                                DATE: 05/28/22 Pt seen for aquatic therapy today.  Treatment took place in water 3.25-4.5 ft in depth at the [facility]. Temp of water was 91.  Pt entered/exited the pool via stairs independently with bilat rail. * gait unsupported:  forward/ backward focused on heel strike, toe off and allowing Lt knee to flex during swing through *Forward and back amb with hand buoys submerged *side stepping yellow hand buoys submerged to side x 2 widths then side lunges with ue add/abd x 2 widths * TrA set : pushing solid blue noodle to knees Le staggered then wide stance x8 in each position * Holding solid blue noodle :1/2 diamonds, curtsy lunges *UE support on wall: minis-squats. Cues and demonstration for proper execution weight through heels  *standing leaning on wall hamstring, gastroc, adductor and IT band stretch using noodle under ankle *solid noodle kick down  hip in neutral then ER x10 R/L   Pt requires the buoyancy and hydrostatic pressure of water for support, and to offload joints by unweighting joint load by at least 50 % in navel deep water and by at least 75-80% in chest to neck deep water.  Viscosity of the water is needed for resistance of strengthening. Water current perturbations provides challenge to standing balance requiring increased core activation.    [facility] Adult PT Treatment:                                                DATE: 05/25/22 Pt seen for aquatic therapy today.  Treatment took place in water 3.25-4.5 ft in depth at the [facility].  Temp of water was 91.  Pt entered/exited the pool via stairs independently with bilat rail. * gait unsupported:  forward/ backward focused on heel strike, toe off and allowing Lt knee to flex during swing through *  side stepping with hydro band above knees - adjusting step length and speed * TrA set with gentle press into yellow noodle x 5 sec hold x 10; then with added slow high knee marching * Holding wall : single leg clams (with limited height of foot on leg); curtsy lunges;  * standing with foot on bench in water: hamstring stretch x 20s; seated on bench -hamstring stretch x 20s  Pt requires the buoyancy and hydrostatic pressure of water for support, and to offload joints by unweighting joint load by at least 50 % in navel deep water and by at least 75-80% in chest to neck deep water.  Viscosity of the water is needed for resistance of strengthening. Water current perturbations provides challenge to standing balance requiring increased core activation.    [facility] Adult PT Treatment:  DATE: 05/23/22 Therapeutic Exercise: Nustep L4 x 5 min LEs only Reviewed HEP Marching with ab set 2x10 ITB stretch 2x30 sec Sit<>stand red TB around knees 2x10 Side step red TB around knees 3x15' Standing ITB stretch x30 sec Captain Morgan's 10x5 sec Manual Therapy: IASTM with massage roller along ITB Gait training: Amb with heel strike decreased trunk lean, glute and quad setting with stance phase   [facility] Adult PT Treatment:                                                DATE: 05/16/22 Therapeutic Exercise: Nustep L5 x 5 min UEs/LEs Sitting Hamstring stretch 2x30 sec Quad set 2x10x3 sec SLR x10 SLR + ER x10 Supine ITB stretch with strap x 30 sec Bridging 2x10x3 sec Heel slide 2x10x3 sec Marching 2x10 Knee to chest on L 2x30 sec with hamstring curl but also uncomfortable for pt Sidelying Clamshell red TB 2x10 Attempted quadruped hip/knee flexion but  too much for pt Manual Therapy: Grade II to III inferior and lateral hip mobilization                                                                                                                              DATE: 05/09/22 See HEP  Has been doing the following from prior PT exercises: SLR (has extension lag) Ankle pumps Ankle ABCs Heel slides with strap  PATIENT EDUCATION:  Education details: Findings, initial HEP, POC Person educated: Patient Education method: Explanation, Demonstration, and Handouts Education comprehension: verbalized understanding, returned demonstration, and needs further education  HOME EXERCISE PROGRAM: Access Code:   URL: [email or web address] Date: 05/16/2022 Prepared by: Estill Bamberg April Thurnell Garbe  Exercises - Seated Hamstring Stretch  - 2 x daily - 7 x weekly - 2 sets - 30 sec hold - Seated Quad Set  - 2 x daily - 7 x weekly - 2 sets - 10 reps - 3 sec hold - Supine ITB Stretch with Strap  - 1 x daily - 7 x weekly - 2 sets - 30 sec hold - Clamshell with Resistance  - 1 x daily - 7 x weekly - 2 sets - 10 reps - Supine Heel Slide with Strap  - 1 x daily - 7 x weekly - 2 sets - 10 reps - Seated Straight Leg Raise with Support  - 1 x daily - 7 x weekly - 2 sets - 10 reps - Supine March  - 1 x daily - 7 x weekly - 2 sets - 10 reps - Supine Bridge  - 1 x daily - 7 x weekly - 2 sets - 10 reps - 3 sec hold  ASSESSMENT:  CLINICAL IMPRESSION: Therapy continues to work on knee extension range with mobilization and soft tissue mobilization. We also worked on going down steps. Therapy will continue  to progress as tolerated. She did well with 2 inch step down.   OBJECTIVE IMPAIRMENTS: Abnormal gait, decreased balance, decreased endurance, decreased mobility, difficulty walking, decreased ROM, decreased strength, hypomobility, increased fascial restrictions, impaired flexibility, postural dysfunction, and pain.    GOALS: Goals reviewed with  patient? Yes  SHORT TERM GOALS: Target date: 06/07/2022  Pt will be ind with initial HEP Baseline: Goal status: Met 06/12/22  2.  Pt will demo L knee ROM >/= 5-105 deg for improved stair ascent/descent Baseline:  Goal status: Met 06/12/22    LONG TERM GOALS: Target date: 07/05/2022   Pt will be ind with progression and advancement of HEP Baseline:  Goal status: INITIAL  2.  Pt will demo L knee ROM of >/= 5 - 115 deg for improved stair/hill ascent/descent Baseline:  Goal status: INITIAL  3.  Pt will be able to demo R & L SLS of >/=10 sec to show improved bilat LE stability Baseline: <5 sec Goal status: INITIAL  4.  Pt will be able to squat at least 20# lower than 70 deg of knee flexion to demo improved functional LE strength for lifting and carrying Baseline: Currently performs with compensation and pain Goal status: INITIAL  5.  Pt will demo normal reciprocal gait pattern without compensated trunk lean x1000' for community mobility Baseline:  Goal status: INITIAL  6.  Pt will have improved [name] score to >/=73 Baseline:  Goal status: INITIAL   PLAN:  PT FREQUENCY: 2x/week  PT DURATION: 8 weeks  PLANNED INTERVENTIONS: Therapeutic exercises, Therapeutic activity, Neuromuscular re-education, Balance training, Gait training, Patient/Family education, Self Care, Joint mobilization, Stair training, Orthotic/Fit training, Aquatic Therapy, Dry Needling, Electrical stimulation, Cryotherapy, Moist heat, Taping, Vasopneumatic device, Ultrasound, Ionotophoresis '4mg'$ /ml Dexamethasone, Manual therapy, and Re-evaluation  PLAN FOR NEXT SESSION: Complete foto. Work on knee ROM. Quad and glute stretching and strengthening. Progress to closed chain exercises as tolerated.   Carolyne Littles PT DPT  06/12/22 [facility] [address] [place], [place], [address] Phone: [phone]   Fax:  [phone]

## 2022-06-26 ENCOUNTER — Ambulatory Visit (HOSPITAL_BASED_OUTPATIENT_CLINIC_OR_DEPARTMENT_OTHER): Payer: Medicare PPO | Admitting: Physical Therapy

## 2022-06-26 ENCOUNTER — Encounter (HOSPITAL_BASED_OUTPATIENT_CLINIC_OR_DEPARTMENT_OTHER): Payer: Self-pay | Admitting: Physical Therapy

## 2022-06-26 DIAGNOSIS — M25562 Pain in left knee: Secondary | ICD-10-CM | POA: Diagnosis not present

## 2022-06-26 DIAGNOSIS — M25662 Stiffness of left knee, not elsewhere classified: Secondary | ICD-10-CM

## 2022-06-26 DIAGNOSIS — M6281 Muscle weakness (generalized): Secondary | ICD-10-CM

## 2022-06-26 DIAGNOSIS — G8929 Other chronic pain: Secondary | ICD-10-CM

## 2022-06-26 DIAGNOSIS — R2689 Other abnormalities of gait and mobility: Secondary | ICD-10-CM

## 2022-06-26 NOTE — Therapy (Signed)
OUTPATIENT PHYSICAL THERAPY TREATMENT    Patient Name: Jaime Rose MRN: [id number] DOB:August 18, 1950, 72 y.o., female Today's Date: 06/26/2022  END OF SESSION:  PT End of Session - 06/26/22 1622     Visit Number 12    Number of Visits 16    Date for PT Re-Evaluation 07/04/22    Authorization Type [employer]    PT Start Time [id number]    PT Stop Time 1655    PT Time Calculation (min) 38 min    Activity Tolerance Patient tolerated treatment well    Behavior During Therapy WFL for tasks assessed/performed               Past Medical History:  Diagnosis Date   ALLERGIC RHINITIS    [place], HX OF 12/21/2009   Qualifier: Diagnosis of  By: Asa Lente MD, Valerie A    Hyperthyroidism    normal TFTs 11/2009 , dx 10/2011   Varicose vein    s/p sclerosis tx summer 2013   Past Surgical History:  Procedure Laterality Date   ENDOVENOUS ABLATION SAPHENOUS VEIN W/ LASER  04-11-2012   endovenous laser ablation right greater saphenous vein and stab phlebectomy  right leg  by Curt Jews MD   Patient Active Problem List   Diagnosis Date Noted   Foot pain, left 06/02/2021   Ptosis of right eyelid 04/12/2020   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 12/29/2018   Osteopenia 01/10/2017   Right carotid bruit 12/21/2016   Prediabetes 12/21/2016   Hypothyroidism 03/20/2012   Varicose veins of lower extremities with other complications [id number]   ALLERGIC RHINITIS 12/21/2009   Cardiac murmur 12/21/2009    PCP: Billey Gosling  REFERRING PROVIDER: Vanetta Mulders, MD  REFERRING DIAG: [phone] (ICD-10-CM) - Unilateral primary osteoarthritis, left knee  THERAPY DIAG:  Chronic pain of left knee  Stiffness of left knee, not elsewhere classified  Muscle weakness (generalized)  Other abnormalities of gait and mobility  Rationale for Evaluation and Treatment: Rehabilitation  ONSET DATE: Multiple years  SUBJECTIVE:   SUBJECTIVE STATEMENT: Pt reports her Lt lateral thigh was  sore/uncomfortable on Saturday. She reports she has been very mindful of how she is walking, including her foot position.  She'd like to be able to get up from floor easier as well as in/out of boat.   PERTINENT HISTORY: From PT order: Possible aquatic candidate  Hip girdle strengthening and LE strengthening for OA From eval: Pt states her knee has been a problem for years. Pt notes that knee was exacerbated in Feb when her foot was swollen (cause is unknown). Pt points to pain primarily along the lateral knee. Pt states it's not excruciating but if she exerts herself she can feel the pain (I.e. going to [place] and going up/down the stairs and hills). Pt with plans to go to [place] in April. It does get stiff over night. Pt states she is doing HEP for her foot/ankle that she was given by PT.   PAIN:  Are you having pain? yes: NPRS scale: 3/10, "just discomfort" Pain location: L anterior knee joint line Pain description: Dull/achy Aggravating factors: Knee bending, descending stairs Relieving factors: Ibuprofen,   PRECAUTIONS: None  WEIGHT BEARING RESTRICTIONS: No  FALLS:  Has patient fallen in last 6 months? No  LIVING ENVIRONMENT: Lives with: lives alone Lives in: House/apartment Stairs: Yes: Internal: 14-15 steps; can reach both and External: 4-5 steps; can reach both Has following equipment at home: None  OCCUPATION: Nutritionist  PATIENT GOALS: Decrease the "waddle",  go up/down stairs   NEXT MD VISIT:   OBJECTIVE:   DIAGNOSTIC FINDINGS: x-ray performed 05/09/22 -- results pending.  PATIENT SURVEYS:  [name] 65; predicted 73  MUSCLE LENGTH: Hamstrings: Right 80 deg; Left <60 deg (likely due to reduced knee ROM) Thomas test: WFL  POSTURE:   Increased knee valgus with Q angle >20 deg on L (greater in standing than supine). Patellar malalignment also noted -- appears slightly lateral to center in supine. R iliac crest higher than L in standing  PALPATION: TTP L lateral  knee and glute med. Hypomobile patellar mobility  LOWER EXTREMITY ROM:  Active ROM Right eval Left eval Left 1/31 Left 06/12/20  Knee flexion 120 100 N/A 116 Seated  Knee extension 0 -8 -5 -5   (Blank rows = not tested)  LOWER EXTREMITY MMT:  MMT Right eval Left eval Right / Left 06/19/22  Hip flexion 4+ 4- 5- / 4  Hip extension 3+ 3 4- / 3+  Hip abduction 4- 3 4 / 3+  Hip adduction     Hip internal rotation     Hip external rotation     Knee flexion 5 5   Knee extension 5 5 (at 10 deg knee flex)   Ankle dorsiflexion     Ankle plantarflexion     Ankle inversion     Ankle eversion      (Blank rows = not tested)  LOWER EXTREMITY SPECIAL TESTS:  Hip special tests: [name] (FABER) test: negative, Trendelenburg test: positive , Ober's test: positive , and Ely's test: negative  FUNCTIONAL TESTS:  SLS: 5 sec R, 1 sec L Squatting demos increased L knee valgus, R weight shift, decreased knee flexion to ~60 deg -- pt compensates into a lunge if she has to squat lower Stair descent demos poor eccentric quad strength with increased valgus  06/19/22: SLS 8 sec R, 4 sec L  GAIT: Distance walked: 100' Assistive device utilized: None Level of assistance: Complete Independence Comments: L foot externally rotated, knee slightly flexed during stance, antalgic, compensated trendelenburg with trunk lean during stance phase   [facility] Adult PT Treatment:     06/19/22 Pt seen for aquatic therapy today.  Treatment took place in water 3.25-4.5 ft in depth at the [facility]. Temp of water was 91.  Pt entered/exited the pool via stairs independently with bilat rail.  * gait unsupported:  forward/ backward and side stepping * SLS with solid noodle leg press,  hip in neutral x 10, then abdct x10 -(1 sets slow, 1 set fast), no UE support * hip hinge with forward arm reach on noodle x 5;  SLS forward leans holding noodle x 10 each * forward walking kick * side stepping with hydro  band R/L  * forward step ups on 2nd step with LLE, slowly lowering down with RLE, UE on rails x 8 * runners step ups x 10 each (forward step up with driving opp knee up), UE on rails * Lt hamstring stretch with foot on 3rd step x 30s x 2, gentle overpressure from pt into knee ext  * single calf stretch with heel off of step * return to walking forward/backwards  Pt requires the buoyancy and hydrostatic pressure of water for support, and to offload joints by unweighting joint load by at least 50 % in navel deep water and by at least 75-80% in chest to neck deep water.  Viscosity of the water is needed for resistance of strengthening. Water current perturbations  provides challenge to standing balance requiring increased core activation . 2/9 Manual: PA and AP glides; patella mobilization.   SLR: 2x10  Bridge 2x10 with green band  Standing:  Reviewed self IT band stretch including use of a roller and  Step up 4 inch 2x10  Lateral step up 4 inch 2x10  Step down 2 inch with control 2x10   06/19/22 Pt seen for aquatic therapy today.  Treatment took place in water 3.25-4.5 ft in depth at the [facility]. Temp of water was 91.  Pt entered/exited the pool via stairs independently with bilat rail.  * gait unsupported:  forward/ backward  *solid noodle kick downs  hip in neutral then ER x10 R/L ue support yellow hand buoys for added core firing and ankle strengthening *Forward and back amb with hand buoys submerged *Hip hiking 2x10 bottom step *hip hinges. Cues for target ms engagement of gluts and hamstring *L stretch x 5 with tail wagging last reps *adductor sets using BB *STS from 3 (bottom) step x 5 with adductor set using BB 5/5 no LOB *STS from 4th step with add set x 5/5 no LOB *Step ups leading R/L x 5 onto bottom step without UE supirt; x 5 onto 2nd step with ue support for balance  Pt requires the buoyancy and hydrostatic pressure of water for support, and to offload  joints by unweighting joint load by at least 50 % in navel deep water and by at least 75-80% in chest to neck deep water.  Viscosity of the water is needed for resistance of strengthening. Water current perturbations provides challenge to standing balance requiring increased core activation.   06/12/22 Pt seen for aquatic therapy today.  Treatment took place in water 3.25-4.5 ft in depth at the [facility]. Temp of water was 91.  Pt entered/exited the pool via stairs independently with bilat rail.  * gait unsupported:  forward/ backward  *Lateral step ups leading R and L x 12 *Standing ue support on wall: curtsy squats; solid noodle kick downs hip in neutral then ER then neutral x12 R/L unsupported. Cues for SLS, abdominal bracing *Hip hinges with LB stretch x 10.  Cues for target muscle/execution as well as demonstration *Forward jogging x 6 widths *Forward skipping x 4 widths *stair tapping Balance *STS from 3 (bottom) step x 10 gaining immediate standing balance 9/10 x. *adductor sets using BB *STS from 3rd step with add set x 10; from 4th (bottom/less submerged) x5   Pt requires the buoyancy and hydrostatic pressure of water for support, and to offload joints by unweighting joint load by at least 50 % in navel deep water and by at least 75-80% in chest to neck deep water.  Viscosity of the water is needed for resistance of strengthening. Water current perturbations provides challenge to standing balance requiring increased core activation.  Manual: PA and AP glides; patella mobilization with instruction on indep completion. ROM measurement left knee:5-116    [facility] Adult PT Treatment:                                                DATE:  2/9 Manual: PA and AP glides; patella mobilization.  SLR: 2x10  Bridge 2x10 with green band  Standing:  Gastroc stretch 3x20 sec hold  Step up 4 inch 2x10  Lateral step up 4  inch 2x10    06/05/22 Pt seen for aquatic therapy today.   Treatment took place in water 3.25-4.5 ft in depth at the [facility]. Temp of water was 91.  Pt entered/exited the pool via stairs independently with bilat rail.  * gait unsupported:  forward/ backward  *standing ue support yellow hand buoys *standing leaning on wall :IT band stretch *Standing ue support on wall 4.0 ft hip circles CW & ccw; solid noodle kick downs  hip in neutral then ER x10 R/L ue support yellow hand buoys for added core firing and ankle strengthening *Forward and back amb with hand buoys submerged *Lateral step ups leading R and L x 12 *VMO strengthening/forward step downs x 10 *STS from 3 (bottom) step x 10 gaining immediate standing balance 9/10 x. *adductor sets using BB *STS from 3rd step with add set x 5 *cycling on yellow noodle   Pt requires the buoyancy and hydrostatic pressure of water for support, and to offload joints by unweighting joint load by at least 50 % in navel deep water and by at least 75-80% in chest to neck deep water.  Viscosity of the water is needed for resistance of strengthening. Water current perturbations provides challenge to standing balance requiring increased core activation.   05/30/22 Therapeutic Exercise: Sitting hamstring stretch 2x30 sec Sitting adductor stretch 2x30 sec Sup ITB stretch with strap 2x30 sec Prone quad stretch with strap 2x30 sec Sup hip flexor stretch x2 min Gait: amb with heel strike and glute setting during stance phase Sit<>stand green TB around knees 2x10 Side step green TB 2x10 Gait training: Amb with heel strike decreased trunk lean, glute and quad setting with stance phase   [facility] Adult PT Treatment:                                                DATE: 05/28/22 Pt seen for aquatic therapy today.  Treatment took place in water 3.25-4.5 ft in depth at the [facility]. Temp of water was 91.  Pt entered/exited the pool via stairs independently with bilat rail. * gait unsupported:   forward/ backward focused on heel strike, toe off and allowing Lt knee to flex during swing through *Forward and back amb with hand buoys submerged *side stepping yellow hand buoys submerged to side x 2 widths then side lunges with ue add/abd x 2 widths * TrA set : pushing solid blue noodle to knees Le staggered then wide stance x8 in each position * Holding solid blue noodle :1/2 diamonds, curtsy lunges *UE support on wall: minis-squats. Cues and demonstration for proper execution weight through heels  *standing leaning on wall hamstring, gastroc, adductor and IT band stretch using noodle under ankle *solid noodle kick down  hip in neutral then ER x10 R/L   Pt requires the buoyancy and hydrostatic pressure of water for support, and to offload joints by unweighting joint load by at least 50 % in navel deep water and by at least 75-80% in chest to neck deep water.  Viscosity of the water is needed for resistance of strengthening. Water current perturbations provides challenge to standing balance requiring increased core activation.    [facility] Adult PT Treatment:  DATE: 05/25/22 Pt seen for aquatic therapy today.  Treatment took place in water 3.25-4.5 ft in depth at the [facility]. Temp of water was 91.  Pt entered/exited the pool via stairs independently with bilat rail. * gait unsupported:  forward/ backward focused on heel strike, toe off and allowing Lt knee to flex during swing through *  side stepping with hydro band above knees - adjusting step length and speed * TrA set with gentle press into yellow noodle x 5 sec hold x 10; then with added slow high knee marching * Holding wall : single leg clams (with limited height of foot on leg); curtsy lunges;  * standing with foot on bench in water: hamstring stretch x 20s; seated on bench -hamstring stretch x 20s  Pt requires the buoyancy and hydrostatic pressure of water for support, and  to offload joints by unweighting joint load by at least 50 % in navel deep water and by at least 75-80% in chest to neck deep water.  Viscosity of the water is needed for resistance of strengthening. Water current perturbations provides challenge to standing balance requiring increased core activation.    [facility] Adult PT Treatment:                                                DATE: 05/23/22 Therapeutic Exercise: Nustep L4 x 5 min LEs only Reviewed HEP Marching with ab set 2x10 ITB stretch 2x30 sec Sit<>stand red TB around knees 2x10 Side step red TB around knees 3x15' Standing ITB stretch x30 sec Captain Morgan's 10x5 sec Manual Therapy: IASTM with massage roller along ITB Gait training: Amb with heel strike decreased trunk lean, glute and quad setting with stance phase   [facility] Adult PT Treatment:                                                DATE: 05/16/22 Therapeutic Exercise: Nustep L5 x 5 min UEs/LEs Sitting Hamstring stretch 2x30 sec Quad set 2x10x3 sec SLR x10 SLR + ER x10 Supine ITB stretch with strap x 30 sec Bridging 2x10x3 sec Heel slide 2x10x3 sec Marching 2x10 Knee to chest on L 2x30 sec with hamstring curl but also uncomfortable for pt Sidelying Clamshell red TB 2x10 Attempted quadruped hip/knee flexion but too much for pt Manual Therapy: Grade II to III inferior and lateral hip mobilization                                                                                                                              DATE: 05/09/22 See HEP  Has been doing the following from prior PT exercises: SLR (has extension lag) Ankle pumps Ankle  ABCs Heel slides with strap  PATIENT EDUCATION:  Education details: aquatic progressions/ modifications  Person educated: Patient Education method: Education officer, environmental,  Education comprehension: verbalized understanding, returned demonstration, and needs further education  HOME EXERCISE PROGRAM: Access Code:   URL:  [email or web address] Date: 05/16/2022 Prepared by: Estill Bamberg April Thurnell Garbe  Exercises - Seated Hamstring Stretch  - 2 x daily - 7 x weekly - 2 sets - 30 sec hold - Seated Quad Set  - 2 x daily - 7 x weekly - 2 sets - 10 reps - 3 sec hold - Supine ITB Stretch with Strap  - 1 x daily - 7 x weekly - 2 sets - 30 sec hold - Clamshell with Resistance  - 1 x daily - 7 x weekly - 2 sets - 10 reps - Supine Heel Slide with Strap  - 1 x daily - 7 x weekly - 2 sets - 10 reps - Seated Straight Leg Raise with Support  - 1 x daily - 7 x weekly - 2 sets - 10 reps - Supine March  - 1 x daily - 7 x weekly - 2 sets - 10 reps - Supine Bridge  - 1 x daily - 7 x weekly - 2 sets - 10 reps - 3 sec hold  ASSESSMENT:  CLINICAL IMPRESSION: Pt tolerated aquatic exercises well, only reporting increase in "discomfort" in Lt lateral hamstring (near insertion) after step ups; pain resolved with hamstring stretch at stairs.  Pt interested in strengthening LE so that she can get up from floor and in/out of boat/kayak with ease.  PT to assess LTGs goals next visit and need for additional visits; end of POC.    OBJECTIVE IMPAIRMENTS: Abnormal gait, decreased balance, decreased endurance, decreased mobility, difficulty walking, decreased ROM, decreased strength, hypomobility, increased fascial restrictions, impaired flexibility, postural dysfunction, and pain.    GOALS: Goals reviewed with patient? Yes  SHORT TERM GOALS: Target date: 06/07/2022  Pt will be ind with initial HEP Baseline: Goal status: Met 06/12/22  2.  Pt will demo L knee ROM >/= 5-105 deg for improved stair ascent/descent Baseline:  Goal status: Met 06/12/22    LONG TERM GOALS: Target date: 07/05/2022   Pt will be ind with progression and advancement of HEP Baseline:  Goal status: INITIAL  2.  Pt will demo L knee ROM of >/= 5 - 115 deg for improved stair/hill ascent/descent Baseline:  Goal status: INITIAL  3.  Pt will be able to  demo R & L SLS of >/=10 sec to show improved bilat LE stability Baseline: <5 sec Goal status: INITIAL  4.  Pt will be able to squat at least 20 lower than 70 deg of knee flexion to demo improved functional LE strength for lifting and carrying Baseline: Currently performs with compensation and pain Goal status: INITIAL  5.  Pt will demo normal reciprocal gait pattern without compensated trunk lean x1000' for community mobility Baseline:  Goal status: INITIAL  6.  Pt will have improved [name] score to >/=73 Baseline:  Goal status: INITIAL   PLAN:  PT FREQUENCY: 2x/week  PT DURATION: 8 weeks  PLANNED INTERVENTIONS: Therapeutic exercises, Therapeutic activity, Neuromuscular re-education, Balance training, Gait training, Patient/Family education, Self Care, Joint mobilization, Stair training, Orthotic/Fit training, Aquatic Therapy, Dry Needling, Electrical stimulation, Cryotherapy, Moist heat, Taping, Vasopneumatic device, Ultrasound, Ionotophoresis '4mg'$ /ml Dexamethasone, Manual therapy, and Re-evaluation  PLAN FOR NEXT SESSION: Work on knee ROM. Quad and glute stretching and strengthening. Progress to closed chain exercises  as tolerated.   Kerin Perna, PTA 06/26/22 5:03 PM [facility] [address] [place], [place], [address] Phone: [phone]   Fax:  [phone]

## 2022-06-29 ENCOUNTER — Ambulatory Visit (HOSPITAL_BASED_OUTPATIENT_CLINIC_OR_DEPARTMENT_OTHER): Payer: Medicare PPO | Attending: Orthopaedic Surgery | Admitting: Physical Therapy

## 2022-06-29 DIAGNOSIS — R2689 Other abnormalities of gait and mobility: Secondary | ICD-10-CM | POA: Diagnosis present

## 2022-06-29 DIAGNOSIS — M25562 Pain in left knee: Secondary | ICD-10-CM | POA: Diagnosis present

## 2022-06-29 DIAGNOSIS — M6281 Muscle weakness (generalized): Secondary | ICD-10-CM | POA: Diagnosis present

## 2022-06-29 DIAGNOSIS — G8929 Other chronic pain: Secondary | ICD-10-CM | POA: Diagnosis present

## 2022-06-29 DIAGNOSIS — M25662 Stiffness of left knee, not elsewhere classified: Secondary | ICD-10-CM

## 2022-06-29 NOTE — Therapy (Signed)
OUTPATIENT PHYSICAL THERAPY TREATMENT    Patient Name: Jaime Rose MRN: [id number] DOB:Dec 23, 1950, 72 y.o., female Today's Date: 06/26/2022  END OF SESSION:  PT End of Session - 06/26/22 1622     Visit Number 12    Number of Visits 16    Date for PT Re-Evaluation 07/04/22    Authorization Type [employer]    PT Start Time [id number]    PT Stop Time 1655    PT Time Calculation (min) 38 min    Activity Tolerance Patient tolerated treatment well    Behavior During Therapy WFL for tasks assessed/performed               Past Medical History:  Diagnosis Date   ALLERGIC RHINITIS    [place], HX OF 12/21/2009   Qualifier: Diagnosis of  By: Asa Lente MD, Valerie A    Hyperthyroidism    normal TFTs 11/2009 , dx 10/2011   Varicose vein    s/p sclerosis tx summer 2013   Past Surgical History:  Procedure Laterality Date   ENDOVENOUS ABLATION SAPHENOUS VEIN W/ LASER  04-11-2012   endovenous laser ablation right greater saphenous vein and stab phlebectomy  right leg  by Curt Jews MD   Patient Active Problem List   Diagnosis Date Noted   Foot pain, left 06/02/2021   Ptosis of right eyelid 04/12/2020   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 12/29/2018   Osteopenia 01/10/2017   Right carotid bruit 12/21/2016   Prediabetes 12/21/2016   Hypothyroidism 03/20/2012   Varicose veins of lower extremities with other complications [id number]   ALLERGIC RHINITIS 12/21/2009   Cardiac murmur 12/21/2009    PCP: Billey Gosling  REFERRING PROVIDER: Vanetta Mulders, MD  REFERRING DIAG: [phone] (ICD-10-CM) - Unilateral primary osteoarthritis, left knee  THERAPY DIAG:  Chronic pain of left knee  Stiffness of left knee, not elsewhere classified  Muscle weakness (generalized)  Other abnormalities of gait and mobility  Rationale for Evaluation and Treatment: Rehabilitation  ONSET DATE: Multiple years  SUBJECTIVE:   SUBJECTIVE STATEMENT: Pt reports her Lt lateral thigh was  sore/uncomfortable on Saturday. She reports she has been very mindful of how she is walking, including her foot position.  She'd like to be able to get up from floor easier as well as in/out of boat.   PERTINENT HISTORY: From PT order: Possible aquatic candidate  Hip girdle strengthening and LE strengthening for OA From eval: Pt states her knee has been a problem for years. Pt notes that knee was exacerbated in Feb when her foot was swollen (cause is unknown). Pt points to pain primarily along the lateral knee. Pt states it's not excruciating but if she exerts herself she can feel the pain (I.e. going to [place] and going up/down the stairs and hills). Pt with plans to go to [place] in April. It does get stiff over night. Pt states she is doing HEP for her foot/ankle that she was given by PT.   PAIN:  Are you having pain? yes: NPRS scale: 3/10, "just discomfort" Pain location: L anterior knee joint line Pain description: Dull/achy Aggravating factors: Knee bending, descending stairs Relieving factors: Ibuprofen,   PRECAUTIONS: None  WEIGHT BEARING RESTRICTIONS: No  FALLS:  Has patient fallen in last 6 months? No  LIVING ENVIRONMENT: Lives with: lives alone Lives in: House/apartment Stairs: Yes: Internal: 14-15 steps; can reach both and External: 4-5 steps; can reach both Has following equipment at home: None  OCCUPATION: Nutritionist  PATIENT GOALS: Decrease the "waddle",  go up/down stairs   NEXT MD VISIT:   OBJECTIVE:   DIAGNOSTIC FINDINGS: x-ray performed 05/09/22 -- results pending.  PATIENT SURVEYS:  [name] 65; predicted 73  MUSCLE LENGTH: Hamstrings: Right 80 deg; Left <60 deg (likely due to reduced knee ROM) Thomas test: WFL  POSTURE:   Increased knee valgus with Q angle >20 deg on L (greater in standing than supine). Patellar malalignment also noted -- appears slightly lateral to center in supine. R iliac crest higher than L in standing  PALPATION: TTP L lateral  knee and glute med. Hypomobile patellar mobility  LOWER EXTREMITY ROM:  Active ROM Right eval Left eval Left 1/31 Left 06/12/20  Knee flexion 120 100 N/A 116 Seated  Knee extension 0 -8 -5 -5   (Blank rows = not tested)  LOWER EXTREMITY MMT:  MMT Right eval Left eval Right / Left 06/19/22  Hip flexion 4+ 4- 5- / 4  Hip extension 3+ 3 4- / 3+  Hip abduction 4- 3 4 / 3+  Hip adduction     Hip internal rotation     Hip external rotation     Knee flexion 5 5   Knee extension 5 5 (at 10 deg knee flex)   Ankle dorsiflexion     Ankle plantarflexion     Ankle inversion     Ankle eversion      (Blank rows = not tested)  LOWER EXTREMITY SPECIAL TESTS:  Hip special tests: [name] (FABER) test: negative, Trendelenburg test: positive , Ober's test: positive , and Ely's test: negative  FUNCTIONAL TESTS:  SLS: 5 sec R, 1 sec L Squatting demos increased L knee valgus, R weight shift, decreased knee flexion to ~60 deg -- pt compensates into a lunge if she has to squat lower Stair descent demos poor eccentric quad strength with increased valgus  06/19/22: SLS 8 sec R, 4 sec L  GAIT: Distance walked: 100' Assistive device utilized: None Level of assistance: Complete Independence Comments: L foot externally rotated, knee slightly flexed during stance, antalgic, compensated trendelenburg with trunk lean during stance phase   [facility] Adult PT Treatment:     3/1 Manual: PA and AP glides; patella mobilization.   SLR 2x15 no weight and 1 lb    Step up 6 inch 20  Lateral step up 6 inch x20  Step down 2 inch with control 2x10   Knee to stand 5x with chair and air-ex to take pressure off the knee.   Leg press 2x15 50 lbs   Squat at sink with cuing for technique 2x10       06/19/22 Pt seen for aquatic therapy today.  Treatment took place in water 3.25-4.5 ft in depth at the [facility]. Temp of water was 91.  Pt entered/exited the pool via stairs independently with  bilat rail.  * gait unsupported:  forward/ backward and side stepping * SLS with solid noodle leg press,  hip in neutral x 10, then abdct x10 -(1 sets slow, 1 set fast), no UE support * hip hinge with forward arm reach on noodle x 5;  SLS forward leans holding noodle x 10 each * forward walking kick * side stepping with hydro band R/L  * forward step ups on 2nd step with LLE, slowly lowering down with RLE, UE on rails x 8 * runners step ups x 10 each (forward step up with driving opp knee up), UE on rails * Lt hamstring stretch with foot on 3rd step x  30s x 2, gentle overpressure from pt into knee ext  * single calf stretch with heel off of step * return to walking forward/backwards  Pt requires the buoyancy and hydrostatic pressure of water for support, and to offload joints by unweighting joint load by at least 50 % in navel deep water and by at least 75-80% in chest to neck deep water.  Viscosity of the water is needed for resistance of strengthening. Water current perturbations provides challenge to standing balance requiring increased core activation . 2/9 Manual: PA and AP glides; patella mobilization.   SLR: 2x10  Bridge 2x10 with green band  Standing:  Reviewed self IT band stretch including use of a roller and  Step up 4 inch 2x10  Lateral step up 4 inch 2x10  Step down 2 inch with control 2x10   06/19/22 Pt seen for aquatic therapy today.  Treatment took place in water 3.25-4.5 ft in depth at the [place] [facility]. Temp of water was 91.  Pt entered/exited the pool via stairs independently with bilat rail.  * gait unsupported:  forward/ backward  *solid noodle kick downs  hip in neutral then ER x10 R/L ue support yellow hand buoys for added core firing and ankle strengthening *Forward and back amb with hand buoys submerged *Hip hiking 2x10 bottom step *hip hinges. Cues for target ms engagement of gluts and hamstring *L stretch x 5 with tail wagging last  reps *adductor sets using BB *STS from 3 (bottom) step x 5 with adductor set using BB 5/5 no LOB *STS from 4th step with add set x 5/5 no LOB *Step ups leading R/L x 5 onto bottom step without UE supirt; x 5 onto 2nd step with ue support for balance  Pt requires the buoyancy and hydrostatic pressure of water for support, and to offload joints by unweighting joint load by at least 50 % in navel deep water and by at least 75-80% in chest to neck deep water.  Viscosity of the water is needed for resistance of strengthening. Water current perturbations provides challenge to standing balance requiring increased core activation.   06/12/22 Pt seen for aquatic therapy today.  Treatment took place in water 3.25-4.5 ft in depth at the [facility]. Temp of water was 91.  Pt entered/exited the pool via stairs independently with bilat rail.  * gait unsupported:  forward/ backward  *Lateral step ups leading R and L x 12 *Standing ue support on wall: curtsy squats; solid noodle kick downs hip in neutral then ER then neutral x12 R/L unsupported. Cues for SLS, abdominal bracing *Hip hinges with LB stretch x 10.  Cues for target muscle/execution as well as demonstration *Forward jogging x 6 widths *Forward skipping x 4 widths *stair tapping Balance *STS from 3 (bottom) step x 10 gaining immediate standing balance 9/10 x. *adductor sets using BB *STS from 3rd step with add set x 10; from 4th (bottom/less submerged) x5   Pt requires the buoyancy and hydrostatic pressure of water for support, and to offload joints by unweighting joint load by at least 50 % in navel deep water and by at least 75-80% in chest to neck deep water.  Viscosity of the water is needed for resistance of strengthening. Water current perturbations provides challenge to standing balance requiring increased core activation.  Manual: PA and AP glides; patella mobilization with instruction on indep completion. ROM measurement  left knee:5-116      PATIENT EDUCATION:  Education details: aquatic progressions/  modifications  Person educated: Patient Education method: Explanation, Demonstration,  Education comprehension: verbalized understanding, returned demonstration, and needs further education  HOME EXERCISE PROGRAM: Access Code:   URL: [email or web address] Date: 05/16/2022 Prepared by: Estill Bamberg April Thurnell Garbe  Exercises - Seated Hamstring Stretch  - 2 x daily - 7 x weekly - 2 sets - 30 sec hold - Seated Quad Set  - 2 x daily - 7 x weekly - 2 sets - 10 reps - 3 sec hold - Supine ITB Stretch with Strap  - 1 x daily - 7 x weekly - 2 sets - 30 sec hold - Clamshell with Resistance  - 1 x daily - 7 x weekly - 2 sets - 10 reps - Supine Heel Slide with Strap  - 1 x daily - 7 x weekly - 2 sets - 10 reps - Seated Straight Leg Raise with Support  - 1 x daily - 7 x weekly - 2 sets - 10 reps - Supine March  - 1 x daily - 7 x weekly - 2 sets - 10 reps - Supine Bridge  - 1 x daily - 7 x weekly - 2 sets - 10 reps - 3 sec hold  ASSESSMENT:  CLINICAL IMPRESSION: The patient continues to make progress. We looked at her ability to get up and down off the ground. She needs to do it at church every week. We talked about a set up she can use to drill it at home. She is doing very well. She has been working on a self knee extension stretch that really seems to be working well. Her gait is improving. She is having less lateral movement. We also perr the leg press today. We will continue to progress as tolerated.  OBJECTIVE IMPAIRMENTS: Abnormal gait, decreased balance, decreased endurance, decreased mobility, difficulty walking, decreased ROM, decreased strength, hypomobility, increased fascial restrictions, impaired flexibility, postural dysfunction, and pain.    GOALS: Goals reviewed with patient? Yes  SHORT TERM GOALS: Target date: 06/07/2022  Pt will be ind with initial HEP Baseline: Goal status: Met  06/12/22  2.  Pt will demo L knee ROM >/= 5-105 deg for improved stair ascent/descent Baseline:  Goal status: Met 06/12/22    LONG TERM GOALS: Target date: 07/05/2022   Pt will be ind with progression and advancement of HEP Baseline:  Goal status: INITIAL  2.  Pt will demo L knee ROM of >/= 5 - 115 deg for improved stair/hill ascent/descent Baseline:  Goal status: INITIAL  3.  Pt will be able to demo R & L SLS of >/=10 sec to show improved bilat LE stability Baseline: <5 sec Goal status: INITIAL  4.  Pt will be able to squat at least 20 lower than 70 deg of knee flexion to demo improved functional LE strength for lifting and carrying Baseline: Currently performs with compensation and pain Goal status: INITIAL  5.  Pt will demo normal reciprocal gait pattern without compensated trunk lean x1000' for community mobility Baseline:  Goal status: INITIAL  6.  Pt will have improved [name] score to >/=73 Baseline:  Goal status: INITIAL   PLAN:  PT FREQUENCY: 2x/week  PT DURATION: 8 weeks  PLANNED INTERVENTIONS: Therapeutic exercises, Therapeutic activity, Neuromuscular re-education, Balance training, Gait training, Patient/Family education, Self Care, Joint mobilization, Stair training, Orthotic/Fit training, Aquatic Therapy, Dry Needling, Electrical stimulation, Cryotherapy, Moist heat, Taping, Vasopneumatic device, Ultrasound, Ionotophoresis '4mg'$ /ml Dexamethasone, Manual therapy, and Re-evaluation  PLAN FOR NEXT SESSION: Work on knee ROM. [employer]  and glute stretching and strengthening. Progress to closed chain exercises as tolerated.   Carolyne Littles PT DPT  06/26/22 5:03 PM [facility] [address] [place], [place], [address] Phone: [phone]   Fax:  [phone]   Referring diagnosis? M17.12 (ICD-10-CM) - Unilateral primary osteoarthritis, left knee Treatment diagnosis? (if different than referring diagnosis) M25.562  Chronic pain of Left Knee What was this (referring dx) caused by? '[]'$  Surgery '[]'$  Fall '[x]'$  Ongoing issue '[x]'$  Arthritis '[]'$  Other: ____________   Laterality: '[]'$  Rt '[x]'$  Lt '[]'$  Both   Check all possible CPT codes:                   *CHOOSE 10 OR LESS*                                '[x]'$  97110 (Therapeutic Exercise)                   '[]'$  92507 (SLP Treatment)      '[x]'$  97112 (Neuro Re-ed)                                '[]'$  92526 (Swallowing Treatment)                  '[x]'$  97116 (Gait Training)                                '[]'$  [id number] (Cognitive Training, 1st 15 minutes) '[x]'$  97140 (Manual Therapy)                           '[]'$  97130 (Cognitive Training, each add'l 15 minutes)         '[x]'$  97164 (Re-evaluation)                              '[]'$  Other, List CPT Code ____________  '[x]'$  97530 (Therapeutic Activities)                                            '[x]'$  97535 (Self Care)               '[x]'$  All codes above (97110 - 97535)             '[]'$  74259 (Mechanical Traction)             '[x]'$  97014 (E-stim Unattended)             '[]'$  97032 (E-stim manual)             '[x]'$  97033 (Ionto)             '[]'$  97035 (Ultrasound) '[]'$  97750 (Physical Performance Training) '[x]'$  [id number] (Aquatic Therapy) '[x]'$  97016 (Vasopneumatic Device) '[]'$  [id number] (Paraffin) '[]'$  97034 (Contrast Bath) '[]'$  97597 (Wound Care 1st 20 sq cm) '[]'$  97598 (Wound Care each add'l 20 sq cm) '[]'$  97760 (Orthotic Fabrication, Fitting, Training Initial) '[]'$  [id number] (Prosthetic Management and Training Initial) '[]'$  [id number] (Orthotic or Prosthetic Training/ Modification Subsequent)

## 2022-07-02 ENCOUNTER — Encounter (HOSPITAL_BASED_OUTPATIENT_CLINIC_OR_DEPARTMENT_OTHER): Payer: Self-pay | Admitting: Physical Therapy

## 2022-07-02 ENCOUNTER — Ambulatory Visit (HOSPITAL_BASED_OUTPATIENT_CLINIC_OR_DEPARTMENT_OTHER): Payer: Medicare PPO | Admitting: Physical Therapy

## 2022-07-02 DIAGNOSIS — M6281 Muscle weakness (generalized): Secondary | ICD-10-CM

## 2022-07-02 DIAGNOSIS — M25562 Pain in left knee: Secondary | ICD-10-CM | POA: Diagnosis not present

## 2022-07-02 DIAGNOSIS — R2689 Other abnormalities of gait and mobility: Secondary | ICD-10-CM

## 2022-07-02 DIAGNOSIS — M25662 Stiffness of left knee, not elsewhere classified: Secondary | ICD-10-CM

## 2022-07-02 DIAGNOSIS — G8929 Other chronic pain: Secondary | ICD-10-CM

## 2022-07-02 NOTE — Therapy (Signed)
OUTPATIENT PHYSICAL THERAPY TREATMENT    Patient Name: Jaime Rose MRN: [id number] DOB:Jun 08, 1950, 72 y.o., female Today's Date: 07/02/2022  END OF SESSION:  PT End of Session - 07/02/22 1037     Visit Number 14    Number of Visits 16    Date for PT Re-Evaluation 07/04/22    PT Start Time 1033    PT Stop Time 1115    PT Time Calculation (min) 42 min    Activity Tolerance Patient tolerated treatment well    Behavior During Therapy [facility] for tasks assessed/performed               Past Medical History:  Diagnosis Date   ALLERGIC RHINITIS    [place], HX OF 12/21/2009   Qualifier: Diagnosis of  By: Asa Lente MD, Valerie A    Hyperthyroidism    normal TFTs 11/2009 , dx 10/2011   Varicose vein    s/p sclerosis tx summer 2013   Past Surgical History:  Procedure Laterality Date   ENDOVENOUS ABLATION SAPHENOUS VEIN W/ LASER  04-11-2012   endovenous laser ablation right greater saphenous vein and stab phlebectomy  right leg  by Curt Jews MD   Patient Active Problem List   Diagnosis Date Noted   Foot pain, left 06/02/2021   Ptosis of right eyelid 04/12/2020   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 12/29/2018   Osteopenia 01/10/2017   Right carotid bruit 12/21/2016   Prediabetes 12/21/2016   Hypothyroidism 03/20/2012   Varicose veins of lower extremities with other complications [id number]   ALLERGIC RHINITIS 12/21/2009   Cardiac murmur 12/21/2009    PCP: Billey Gosling  REFERRING PROVIDER: Vanetta Mulders, MD  REFERRING DIAG: [id number] (ICD-10-CM) - Unilateral primary osteoarthritis, left knee  THERAPY DIAG:  Chronic pain of left knee  Stiffness of left knee, not elsewhere classified  Muscle weakness (generalized)  Other abnormalities of gait and mobility  Rationale for Evaluation and Treatment: Rehabilitation  ONSET DATE: Multiple years  SUBJECTIVE:   SUBJECTIVE STATEMENT: Pt reports she  has added a hamstring stretch at home which [name] showed her last aquatic  session 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  Decreases knee pain if it flares.  PERTINENT HISTORY: From PT order: Possible aquatic candidate  Hip girdle strengthening and LE strengthening for OA From eval: Pt states her knee has been a problem for years. Pt notes that knee was exacerbated in Feb when her foot was swollen (cause is unknown). Pt points to pain primarily along the lateral knee. Pt states it's not excruciating but if she exerts herself she can feel the pain (I.e. going to [place] and going up/down the stairs and hills). Pt with plans to go to [place] in April. It does get stiff over night. Pt states she is doing HEP for her foot/ankle that she was given by PT.   PAIN:  Are you having pain? yes: NPRS scale: 2/10, "just discomfort" Pain location: L anterior knee joint line Pain description: Dull/achy Aggravating factors: Knee bending, descending stairs Relieving factors: Ibuprofen,   PRECAUTIONS: None  WEIGHT BEARING RESTRICTIONS: No  FALLS:  Has patient fallen in last 6 months? No  LIVING ENVIRONMENT: Lives with: lives alone Lives in: House/apartment Stairs: Yes: Internal: 14-15 steps; can reach both and External: 4-5 steps; can reach both Has following equipment at home: None  OCCUPATION: Nutritionist  PATIENT GOALS: Decrease the "waddle", go up/down stairs   NEXT MD VISIT:   OBJECTIVE:   DIAGNOSTIC FINDINGS: x-ray performed 05/09/22 -- results pending.  PATIENT SURVEYS:  [name] 65; predicted 73  MUSCLE LENGTH: Hamstrings: Right 80 deg; Left <60 deg (likely due to reduced knee ROM) Thomas test: WFL  POSTURE:   Increased knee valgus with Q angle >20 deg on L (greater in standing than supine). Patellar malalignment also noted -- appears slightly lateral to center in supine. R iliac crest higher than L in standing  PALPATION: TTP L lateral knee and glute med. Hypomobile patellar mobility  LOWER EXTREMITY ROM:  Active ROM Right eval Left eval Left 1/31  Left 06/12/20  Knee flexion 120 100 N/A 116 Seated  Knee extension 0 -8 -5 -5   (Blank rows = not tested)  LOWER EXTREMITY MMT:  MMT Right eval Left eval Right / Left 06/19/22  Hip flexion 4+ 4- 5- / 4  Hip extension 3+ 3 4- / 3+  Hip abduction 4- 3 4 / 3+  Hip adduction     Hip internal rotation     Hip external rotation     Knee flexion 5 5   Knee extension 5 5 (at 10 deg knee flex)   Ankle dorsiflexion     Ankle plantarflexion     Ankle inversion     Ankle eversion      (Blank rows = not tested)  LOWER EXTREMITY SPECIAL TESTS:  Hip special tests: [name] (FABER) test: negative, Trendelenburg test: positive , Ober's test: positive , and Ely's test: negative  FUNCTIONAL TESTS:  SLS: 5 sec R, 1 sec L Squatting demos increased L knee valgus, R weight shift, decreased knee flexion to ~60 deg -- pt compensates into a lunge if she has to squat lower Stair descent demos poor eccentric quad strength with increased valgus  06/19/22: SLS 8 sec R, 4 sec L  GAIT: Distance walked: 100' Assistive device utilized: None Level of assistance: Complete Independence Comments: L foot externally rotated, knee slightly flexed during stance, antalgic, compensated trendelenburg with trunk lean during stance phase   [facility] Adult PT Treatment:      07/02/22 Pt seen for aquatic therapy today.  Treatment took place in water 3.25-4.5 ft in depth at the [facility]. Temp of water was 91.  Pt entered/exited the pool via stairs independently with bilat rail.  * gait unsupported:  forward/ backward and side stepping * Lt hamstring stretch with foot on 3rd step x 30s x 2, gentle overpressure from pt into knee ext  * SLS with solid noodle leg press,  hip in neutral x 10, then abdct x10 -(1 sets slow, 1 set fast), no UE support * hip hinge with forward arm reach on noodle x 10;  SLS forward leans holding noodle x 10 each * runners step ups x 10 each (forward step up with driving opp knee up)  unsupported SLS Ue support yellow HB holding x 20 s then ue add/abd x10 *hip hinge on bottom step x10 R/L cues for eccentric slow lowering *Gastroc stretch bottom step *heel walking 4 widths forward and back  Pt requires the buoyancy and hydrostatic pressure of water for support, and to offload joints by unweighting joint load by at least 50 % in navel deep water and by at least 75-80% in chest to neck deep water.  Viscosity of the water is needed for resistance of strengthening. Water current perturbations provides challenge to standing balance requiring increased core activation    3/1  Manual: PA and AP glides; patella mobilization.    SLR 2x15 no weight and 1 lb  Step up 6 inch 20  Lateral step up 6 inch x20  Step down 2 inch with control 2x10    Knee to stand 5x with chair and air-ex to take pressure off the knee.    Leg press 2x15 50 lbs    Squat at sink with cuing for technique 2x10       PATIENT EDUCATION:  Education details: aquatic progressions/ modifications  Person educated: Patient Education method: Education officer, environmental,  Education comprehension: verbalized understanding, returned demonstration, and needs further education  HOME EXERCISE PROGRAM: Access Code:   URL: [email or web address] Date: 05/16/2022 Prepared by: Estill Bamberg April Thurnell Garbe  Exercises - Seated Hamstring Stretch  - 2 x daily - 7 x weekly - 2 sets - 30 sec hold - Seated Quad Set  - 2 x daily - 7 x weekly - 2 sets - 10 reps - 3 sec hold - Supine ITB Stretch with Strap  - 1 x daily - 7 x weekly - 2 sets - 30 sec hold - Clamshell with Resistance  - 1 x daily - 7 x weekly - 2 sets - 10 reps - Supine Heel Slide with Strap  - 1 x daily - 7 x weekly - 2 sets - 10 reps - Seated Straight Leg Raise with Support  - 1 x daily - 7 x weekly - 2 sets - 10 reps - Supine March  - 1 x daily - 7 x weekly - 2 sets - 10 reps - Supine Bridge  - 1 x daily - 7 x weekly - 2 sets - 10  reps - 3 sec hold  ASSESSMENT:  CLINICAL IMPRESSION: Progressing well. Tolerates progressing of aquatic intervention adding increasingly more advanced tasks for strengthening and stretching LLE.  She reports decrease in overall pain. She is conscious with  application of positioning posturally when working which has improved her toleration to activity  The patient continues to make progress. We looked at her ability to get up and down off the ground. She needs to do it at church every week. We talked about a set up she can use to drill it at home. She is doing very well. She has been working on a self knee extension stretch that really seems to be working well. Her gait is improving. She is having less lateral movement. We also perr the leg press today. We will continue to progress as tolerated.  OBJECTIVE IMPAIRMENTS: Abnormal gait, decreased balance, decreased endurance, decreased mobility, difficulty walking, decreased ROM, decreased strength, hypomobility, increased fascial restrictions, impaired flexibility, postural dysfunction, and pain.    GOALS: Goals reviewed with patient? Yes  SHORT TERM GOALS: Target date: 06/07/2022  Pt will be ind with initial HEP Baseline: Goal status: Met 06/12/22  2.  Pt will demo L knee ROM >/= 5-105 deg for improved stair ascent/descent Baseline:  Goal status: Met 06/12/22    LONG TERM GOALS: Target date: 07/05/2022   Pt will be ind with progression and advancement of HEP Baseline:  Goal status: INITIAL  2.  Pt will demo L knee ROM of >/= 5 - 115 deg for improved stair/hill ascent/descent Baseline:  Goal status: INITIAL  3.  Pt will be able to demo R & L SLS of >/=10 sec to show improved bilat LE stability Baseline: <5 sec Goal status: INITIAL  4.  Pt will be able to squat at least 20 lower than 70 deg of knee flexion to demo improved functional LE strength for lifting and carrying Baseline: Currently  performs with compensation and pain Goal  status: INITIAL  5.  Pt will demo normal reciprocal gait pattern without compensated trunk lean x1000' for community mobility Baseline:  Goal status: INITIAL  6.  Pt will have improved [name] score to >/=73 Baseline:  Goal status: INITIAL   PLAN:  PT FREQUENCY: 2x/week  PT DURATION: 8 weeks  PLANNED INTERVENTIONS: Therapeutic exercises, Therapeutic activity, Neuromuscular re-education, Balance training, Gait training, Patient/Family education, Self Care, Joint mobilization, Stair training, Orthotic/Fit training, Aquatic Therapy, Dry Needling, Electrical stimulation, Cryotherapy, Moist heat, Taping, Vasopneumatic device, Ultrasound, Ionotophoresis '4mg'$ /ml Dexamethasone, Manual therapy, and Re-evaluation  PLAN FOR NEXT SESSION: Work on knee ROM. Quad and glute stretching and strengthening. Progress to closed chain exercises as tolerated.   Stanton Kidney [place]) Araina Butrick MPT 07/02/22 10:37 AM [facility] [address] [place], [place], [address] Phone: [phone]   Fax:  [phone]   Referring diagnosis? M17.12 (ICD-10-CM) - Unilateral primary osteoarthritis, left knee Treatment diagnosis? (if different than referring diagnosis) M25.562 Chronic pain of Left Knee What was this (referring dx) caused by? '[]'$  Surgery '[]'$  Fall '[x]'$  Ongoing issue '[x]'$  Arthritis '[]'$  Other: ____________   Laterality: '[]'$  Rt '[x]'$  Lt '[]'$  Both   Check all possible CPT codes:                   *CHOOSE 10 OR LESS*                                '[x]'$  97110 (Therapeutic Exercise)                   '[]'$  92507 (SLP Treatment)      '[x]'$  97112 (Neuro Re-ed)                                '[]'$  92526 (Swallowing Treatment)                  '[x]'$  97116 (Gait Training)                                '[]'$  [id number] (Cognitive Training, 1st 15 minutes) '[x]'$  97140 (Manual Therapy)                           '[]'$  97130 (Cognitive Training, each add'l 15 minutes)         '[x]'$  97164 (Re-evaluation)                               '[]'$  Other, List CPT Code ____________  '[x]'$  [id number] (Therapeutic Activities)                                            '[x]'$  [id number] (Self Care)               '[x]'$  All codes above (97110 - 97535)             '[]'$  97012 (Mechanical Traction)             '[x]'$  97014 (E-stim Unattended)             '[]'$  97032 (  E-stim manual)             '[x]'$  97033 (Ionto)             '[]'$  97035 (Ultrasound) '[]'$  97750 (Physical Performance Training) '[x]'$  [id number] (Aquatic Therapy) '[x]'$  97016 (Vasopneumatic Device) '[]'$  [id number] (Paraffin) '[]'$  97034 (Contrast Bath) '[]'$  [phone] (Wound Care 1st 20 sq cm) '[]'$  97598 (Wound Care each add'l 20 sq cm) '[]'$  97760 (Orthotic Fabrication, Fitting, Training Initial) '[]'$  [id number] (Prosthetic Management and Training Initial) '[]'$  96295 (Orthotic or Prosthetic Training/ Modification Subsequent)

## 2022-07-11 ENCOUNTER — Ambulatory Visit (HOSPITAL_BASED_OUTPATIENT_CLINIC_OR_DEPARTMENT_OTHER): Payer: Medicare PPO | Admitting: Physical Therapy

## 2022-07-11 ENCOUNTER — Encounter (HOSPITAL_BASED_OUTPATIENT_CLINIC_OR_DEPARTMENT_OTHER): Payer: Self-pay | Admitting: Physical Therapy

## 2022-07-11 DIAGNOSIS — R2689 Other abnormalities of gait and mobility: Secondary | ICD-10-CM

## 2022-07-11 DIAGNOSIS — M6281 Muscle weakness (generalized): Secondary | ICD-10-CM

## 2022-07-11 DIAGNOSIS — G8929 Other chronic pain: Secondary | ICD-10-CM

## 2022-07-11 DIAGNOSIS — M25562 Pain in left knee: Secondary | ICD-10-CM | POA: Diagnosis not present

## 2022-07-11 DIAGNOSIS — M25662 Stiffness of left knee, not elsewhere classified: Secondary | ICD-10-CM

## 2022-07-11 NOTE — Therapy (Signed)
OUTPATIENT PHYSICAL THERAPY TREATMENT    Patient Name: Jaime Rose MRN: [id number] DOB:11/25/1950, 72 y.o., female Today's Date: 07/11/2022  END OF SESSION:  PT End of Session - 07/11/22 1346     Visit Number 15    Number of Visits 21    Date for PT Re-Evaluation 08/09/22    Authorization Type humana    PT Start Time 1334    PT Stop Time 1415    PT Time Calculation (min) 41 min    Activity Tolerance Patient tolerated treatment well    Behavior During Therapy WFL for tasks assessed/performed               Past Medical History:  Diagnosis Date   ALLERGIC RHINITIS    [place], HX OF 12/21/2009   Qualifier: Diagnosis of  By: Asa Lente MD, Valerie A    Hyperthyroidism    normal TFTs 11/2009 , dx 10/2011   Varicose vein    s/p sclerosis tx summer 2013   Past Surgical History:  Procedure Laterality Date   ENDOVENOUS ABLATION SAPHENOUS VEIN W/ LASER  04-11-2012   endovenous laser ablation right greater saphenous vein and stab phlebectomy  right leg  by Curt Jews MD   Patient Active Problem List   Diagnosis Date Noted   Foot pain, left 06/02/2021   Ptosis of right eyelid 04/12/2020   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 12/29/2018   Osteopenia 01/10/2017   Right carotid bruit 12/21/2016   Prediabetes 12/21/2016   Hypothyroidism 03/20/2012   Varicose veins of lower extremities with other complications [id number]   ALLERGIC RHINITIS 12/21/2009   Cardiac murmur 12/21/2009    PCP: Billey Gosling  REFERRING PROVIDER: Vanetta Mulders, MD  REFERRING DIAG: [id number] (ICD-10-CM) - Unilateral primary osteoarthritis, left knee  THERAPY DIAG:  Chronic pain of left knee  Stiffness of left knee, not elsewhere classified  Muscle weakness (generalized)  Other abnormalities of gait and mobility  Rationale for Evaluation and Treatment: Rehabilitation  ONSET DATE: Multiple years  SUBJECTIVE:   SUBJECTIVE STATEMENT: Pt reports she  has added a hamstring stretch at home  which [name] showed her last aquatic session 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  Decreases knee pain if it flares.  PERTINENT HISTORY: From PT order: Possible aquatic candidate  Hip girdle strengthening and LE strengthening for OA From eval: Pt states her knee has been a problem for years. Pt notes that knee was exacerbated in Feb when her foot was swollen (cause is unknown). Pt points to pain primarily along the lateral knee. Pt states it's not excruciating but if she exerts herself she can feel the pain (I.e. going to [place] and going up/down the stairs and hills). Pt with plans to go to [place] in April. It does get stiff over night. Pt states she is doing HEP for her foot/ankle that she was given by PT.   PAIN:  Are you having pain? yes: NPRS scale: 1-2/10, "just discomfort" Pain location: L anterior knee joint line Pain description: Dull/achy Aggravating factors: Knee bending, descending stairs Relieving factors: Ibuprofen,   PRECAUTIONS: None  WEIGHT BEARING RESTRICTIONS: No  FALLS:  Has patient fallen in last 6 months? No  LIVING ENVIRONMENT: Lives with: lives alone Lives in: House/apartment Stairs: Yes: Internal: 14-15 steps; can reach both and External: 4-5 steps; can reach both Has following equipment at home: None  OCCUPATION: Nutritionist  PATIENT GOALS: Decrease the "waddle", go up/down stairs   NEXT MD VISIT:   OBJECTIVE:   DIAGNOSTIC FINDINGS:  x-ray performed 05/09/22 -- results pending.  PATIENT SURVEYS:  [name] 65; predicted 73  MUSCLE LENGTH: Hamstrings: Right 80 deg; Left <60 deg (likely due to reduced knee ROM) Thomas test: WFL  POSTURE:   Increased knee valgus with Q angle >20 deg on L (greater in standing than supine). Patellar malalignment also noted -- appears slightly lateral to center in supine. R iliac crest higher than L in standing  PALPATION: TTP L lateral knee and glute med. Hypomobile patellar mobility  LOWER EXTREMITY ROM:  Active ROM  Right eval Left eval Left 1/31 Left 06/12/20 Left 07/11/22  Knee flexion 120 100 N/A 116 Seated 115 seated  Knee extension 0 -8 -5 -5 -3   (Blank rows = not tested)  LOWER EXTREMITY MMT:  MMT Right eval Left eval Right / Left 06/19/22 Right / Left 07/11/22  Hip flexion 4+ 4- 5- / 4 5 / 4+  Hip extension 3+ 3 4- / 3+   Hip abduction 4- 3 4 / 3+   Hip adduction      Hip internal rotation      Hip external rotation      Knee flexion 5 5    Knee extension 5 5 (at 10 deg knee flex)    Ankle dorsiflexion      Ankle plantarflexion      Ankle inversion      Ankle eversion       (Blank rows = not tested)  LOWER EXTREMITY SPECIAL TESTS:  Hip special tests: [name] (FABER) test: negative, Trendelenburg test: positive , Ober's test: positive , and Ely's test: negative  FUNCTIONAL TESTS:  SLS: 5 sec R, 1 sec L Squatting demos increased L knee valgus, R weight shift, decreased knee flexion to ~60 deg -- pt compensates into a lunge if she has to squat lower Stair descent demos poor eccentric quad strength with increased valgus  06/19/22: SLS 8 sec R, 4 sec L 07/11/22: SLS  10s R, 10s Left (after several tries) -stair descent continues with fair eccentric quad strength. (Improved)  GAIT: Distance walked: 100' Assistive device utilized: None Level of assistance: Complete Independence Comments: L foot externally rotated, knee slightly flexed during stance, antalgic, compensated trendelenburg with trunk lean during stance phase  07/11/22: Pt able to control compensated trendelenburg decreasing trunk lean with conscious effort although will divert into when tired/fatigued ~566f.  Continued left foot ER with return to old gait pattern   [facility] Adult PT Treatment:      07/11/22 Pt seen for aquatic therapy today.  Treatment took place in water 3.25-4.5 ft in depth at the [facility] Temp of water was 91.  Pt entered/exited the pool via stairs independently with bilat rail.  *  gait unsupported:  forward/ backward and side stepping * Lt hamstring stretch with foot on 3rd step x 30s x 2, gentle overpressure from pt into knee ext  * SLS with solid noodle leg press,  hip in neutral x 10, then abdct x10 -(1 sets slow, 1 set fast), no UE support * hip hinge with forward arm reach on noodle x 10;  SLS forward leans holding noodle x 10 each *SLS R/L completed submerged to bottom 3.6 ft then bottom step R/L able to hold >20s  Pt requires the buoyancy and hydrostatic pressure of water for support, and to offload joints by unweighting joint load by at least 50 % in navel deep water and by at least 75-80% in chest to neck deep water.  Viscosity of the water is needed for resistance of strengthening. Water current perturbations provides challenge to standing balance requiring increased core activation  Objective testing  3/1  Manual: PA and AP glides; patella mobilization.    SLR 2x15 no weight and 1 lb      Step up 6 inch 20  Lateral step up 6 inch x20  Step down 2 inch with control 2x10    Knee to stand 5x with chair and air-ex to take pressure off the knee.    Leg press 2x15 50 lbs    Squat at sink with cuing for technique 2x10       PATIENT EDUCATION:  Education details: aquatic progressions/ modifications  Person educated: Patient Education method: Education officer, environmental,  Education comprehension: verbalized understanding, returned demonstration, and needs further education  HOME EXERCISE PROGRAM: Access Code:   URL: [email or web address] Date: 05/16/2022 Prepared by: Estill Bamberg April Thurnell Garbe  Exercises - Seated Hamstring Stretch  - 2 x daily - 7 x weekly - 2 sets - 30 sec hold - Seated Quad Set  - 2 x daily - 7 x weekly - 2 sets - 10 reps - 3 sec hold - Supine ITB Stretch with Strap  - 1 x daily - 7 x weekly - 2 sets - 30 sec hold - Clamshell with Resistance  - 1 x daily - 7 x weekly - 2 sets - 10 reps - Supine Heel Slide with  Strap  - 1 x daily - 7 x weekly - 2 sets - 10 reps - Seated Straight Leg Raise with Support  - 1 x daily - 7 x weekly - 2 sets - 10 reps - Supine March  - 1 x daily - 7 x weekly - 2 sets - 10 reps - Supine Bridge  - 1 x daily - 7 x weekly - 2 sets - 10 reps - 3 sec hold  ASSESSMENT:  CLINICAL IMPRESSION: PN: Pt reports compliance with HEP daily before leaving home (land based). Reports stretching has improved overall pain symptoms in knee.  With objective testing she has improved eccentric quad strength with descending stair although continues with some strength deficit. She also has improved gait pattern with decreased trendelenburg/(improved) demonstrating improved glut medius and minimus strength although she does continue with strength deficit as well. Left knee extension just shy of full. She is able to complete SLS x 10 s R/L. Reported pain very low described more as a discomfort. [name] score 69 meeting goal.  She has progressed well. Lingering concerns are quad and glut strength and consistent gait without reverting back into trendelenburg.  She has reached her max potential in aquatic but will continue to benefit from land based intervention to address strength for a short time.  Plan to see in aquatics x 1 for final HEP then land based x 2-4 more visits.    OBJECTIVE IMPAIRMENTS: Abnormal gait, decreased balance, decreased endurance, decreased mobility, difficulty walking, decreased ROM, decreased strength, hypomobility, increased fascial restrictions, impaired flexibility, postural dysfunction, and pain.    GOALS: Goals reviewed with patient? Yes  SHORT TERM GOALS: Target date: 06/07/2022  Pt will be ind with initial HEP Baseline: Goal status: Met 06/12/22  2.  Pt will demo L knee ROM >/= 5-105 deg for improved stair ascent/descent Baseline:  Goal status: Met 06/12/22    LONG TERM GOALS: Target date: 07/05/2022   Pt will be ind with progression and advancement of HEP Baseline:   Goal status: initial  2.  Pt will demo L knee ROM of >/= 5 - 115 deg for improved stair/hill ascent/descent Baseline:  Goal status: Met 07/11/22  3.  Pt will be able to demo R & L SLS of >/=10 sec to show improved bilat LE stability Baseline: <5 sec Goal status: Partially met (not consist) 07/11/22  4.  Pt will be able to squat at least 20 lower than 70 deg of knee flexion to demo improved functional LE strength for lifting and carrying Baseline: Currently performs with compensation and pain Goal status: ongoing 07/11/22  5.  Pt will demo normal reciprocal gait pattern without compensated trunk lean x1000' for community mobility Baseline:  Goal status: progressing 07/11/22  6.  Pt will have improved [name] score to >/=73 Baseline:  Goal status: Met 07/11/22   PLAN:  PT FREQUENCY: 1-2xweek  PT DURATION: 4 weeks  PLANNED INTERVENTIONS: Therapeutic exercises, Therapeutic activity, Neuromuscular re-education, Balance training, Gait training, Patient/Family education, Self Care, Joint mobilization, Stair training, Orthotic/Fit training, Aquatic Therapy, Dry Needling, Electrical stimulation, Cryotherapy, Moist heat, Taping, Vasopneumatic device, Ultrasound, Ionotophoresis '4mg'$ /ml Dexamethasone, Manual therapy, and Re-evaluation  PLAN FOR NEXT SESSION: Work on knee ROM. Quad and glute stretching and strengthening. Progress to closed chain exercises as tolerated.   Stanton Kidney [place]) Liviah Cake MPT 07/11/22 2:56 PM [facility] [address] [place], [place], [address] Phone: [phone]   Fax:  [phone]   Referring diagnosis? M17.12 (ICD-10-CM) - Unilateral primary osteoarthritis, left knee Treatment diagnosis? (if different than referring diagnosis) M25.562 Chronic pain of Left Knee What was this (referring dx) caused by? '[]'$  Surgery '[]'$  Fall '[x]'$  Ongoing issue '[x]'$  Arthritis '[]'$  Other: ____________   Laterality: '[]'$  Rt '[x]'$  Lt '[]'$   Both   Check all possible CPT codes:                   *CHOOSE 10 OR LESS*                                '[x]'$  97110 (Therapeutic Exercise)                   '[]'$  92507 (SLP Treatment)      '[x]'$  97112 (Neuro Re-ed)                                '[]'$  92526 (Swallowing Treatment)                  '[x]'$  97116 (Gait Training)                                '[]'$  [id number] (Cognitive Training, 1st 15 minutes) '[x]'$  97140 (Manual Therapy)                           '[]'$  97130 (Cognitive Training, each add'l 15 minutes)         '[x]'$  97164 (Re-evaluation)                              '[]'$  Other, List CPT Code ____________  '[x]'$  [id number] (Therapeutic Activities)                                            [  x] [phone] (Self Care)               '[x]'$  All codes above (97110 - 97535)             '[]'$  97012 (Mechanical Traction)             '[x]'$  97014 (E-stim Unattended)             '[]'$  97032 (E-stim manual)             '[x]'$  97033 (Ionto)             '[]'$  97035 (Ultrasound) '[]'$  97750 (Physical Performance Training) '[x]'$  [id number] (Aquatic Therapy) '[x]'$  97016 (Vasopneumatic Device) '[]'$  [id number] (Paraffin) '[]'$  97034 (Contrast Bath) '[]'$  97597 (Wound Care 1st 20 sq cm) '[]'$  97598 (Wound Care each add'l 20 sq cm) '[]'$  97760 (Orthotic Fabrication, Fitting, Training Initial) '[]'$  [id number] (Prosthetic Management and Training Initial) '[]'$  [id number] (Orthotic or Prosthetic Training/ Modification Subsequent)

## 2022-07-13 ENCOUNTER — Ambulatory Visit (HOSPITAL_BASED_OUTPATIENT_CLINIC_OR_DEPARTMENT_OTHER): Payer: Medicare PPO | Admitting: Physical Therapy

## 2022-07-13 ENCOUNTER — Encounter (HOSPITAL_BASED_OUTPATIENT_CLINIC_OR_DEPARTMENT_OTHER): Payer: Self-pay | Admitting: Physical Therapy

## 2022-07-13 DIAGNOSIS — G8929 Other chronic pain: Secondary | ICD-10-CM

## 2022-07-13 DIAGNOSIS — M6281 Muscle weakness (generalized): Secondary | ICD-10-CM

## 2022-07-13 DIAGNOSIS — M25562 Pain in left knee: Secondary | ICD-10-CM | POA: Diagnosis not present

## 2022-07-13 DIAGNOSIS — R2689 Other abnormalities of gait and mobility: Secondary | ICD-10-CM

## 2022-07-13 DIAGNOSIS — M25662 Stiffness of left knee, not elsewhere classified: Secondary | ICD-10-CM

## 2022-07-13 NOTE — Therapy (Signed)
OUTPATIENT PHYSICAL THERAPY TREATMENT    Patient Name: Jaime Rose MRN: [id number] DOB:[birth year], 72 y.o., female Today's Date: 07/13/2022  END OF SESSION:  PT End of Session - 07/13/22 1603     Visit Number 16    Number of Visits 21    Date for PT Re-Evaluation 08/09/22    Authorization Type humana    PT Start Time 1600    PT Stop Time 1642    PT Time Calculation (min) 42 min    Activity Tolerance Patient tolerated treatment well    Behavior During Therapy WFL for tasks assessed/performed               Past Medical History:  Diagnosis Date   ALLERGIC RHINITIS    [place], HX OF 12/21/2009   Qualifier: Diagnosis of  By: Jaime Lente MD, Jaime Rose    Hyperthyroidism    normal TFTs 11/2009 , dx 10/2011   Varicose vein    s/p sclerosis tx summer 2013   Past Surgical History:  Procedure Laterality Date   ENDOVENOUS ABLATION SAPHENOUS VEIN W/ LASER  04-11-2012   endovenous laser ablation right greater saphenous vein and stab phlebectomy  right leg  by Jaime Jews MD   Patient Active Problem List   Diagnosis Date Noted   Foot pain, left 06/02/2021   Ptosis of right eyelid 04/12/2020   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 12/29/2018   Osteopenia 01/10/2017   Right carotid bruit 12/21/2016   Prediabetes 12/21/2016   Hypothyroidism 03/20/2012   Varicose veins of lower extremities with other complications [id number]   ALLERGIC RHINITIS 12/21/2009   Cardiac murmur 12/21/2009    PCP: Jaime Rose  REFERRING PROVIDER: Vanetta Mulders, MD  REFERRING DIAG: [id number] (ICD-10-CM) - Unilateral primary osteoarthritis, left knee  THERAPY DIAG:  Chronic pain of left knee  Stiffness of left knee, not elsewhere classified  Muscle weakness (generalized)  Other abnormalities of gait and mobility  Rationale for Evaluation and Treatment: Rehabilitation  ONSET DATE: Multiple years  SUBJECTIVE:   SUBJECTIVE STATEMENT: Pt reports she  has added Rose hamstring stretch at home  which Jaime Rose showed her last aquatic session that "has been Rose Higher education careers adviser".  Decreases knee pain if it flares.  PERTINENT HISTORY: From PT order: Possible aquatic candidate  Hip girdle strengthening and LE strengthening for OA From eval: Pt states her knee has been Rose problem for years. Pt notes that knee was exacerbated in Feb when her foot was swollen (cause is unknown). Pt points to pain primarily along the lateral knee. Pt states it's not excruciating but if she exerts herself she can feel the pain (I.e. going to [place] and going up/down the stairs and hills). Pt with plans to go to [place] in April. It does get stiff over night. Pt states she is doing HEP for her foot/ankle that she was given by PT.   PAIN:  Are you having pain? yes: NPRS scale: 1-2/10, "just discomfort" Pain location: L anterior knee joint line Pain description: Dull/achy Aggravating factors: Knee bending, descending stairs Relieving factors: Ibuprofen,   PRECAUTIONS: None  WEIGHT BEARING RESTRICTIONS: No  FALLS:  Has patient fallen in last 6 months? No  LIVING ENVIRONMENT: Lives with: lives alone Lives in: House/apartment Stairs: Yes: Internal: 14-15 steps; can reach both and External: 4-5 steps; can reach both Has following equipment at home: None  OCCUPATION: Nutritionist  PATIENT GOALS: Decrease the "waddle", go up/down stairs   NEXT MD VISIT:   OBJECTIVE:   DIAGNOSTIC FINDINGS:  x-ray performed 05/09/22 -- results pending.  PATIENT SURVEYS:  [name] 65; predicted 73  MUSCLE LENGTH: Hamstrings: Right 80 deg; Left <60 deg (likely due to reduced knee ROM) Thomas test: WFL  POSTURE:   Increased knee valgus with Q angle >20 deg on L (greater in standing than supine). Patellar malalignment also noted -- appears slightly lateral to center in supine. R iliac crest higher than L in standing  PALPATION: TTP L lateral knee and glute med. Hypomobile patellar mobility  LOWER EXTREMITY ROM:  Active ROM  Right eval Left eval Left 1/31 Left 06/12/20 Left 07/11/22  Knee flexion 120 100 N/Rose 116 Seated 115 seated  Knee extension 0 -8 -5 -5 -3   (Blank rows = not tested)  LOWER EXTREMITY MMT:  MMT Right eval Left eval Right / Left 06/19/22 Right / Left 07/11/22  Hip flexion 4+ 4- 5- / 4 5 / 4+  Hip extension 3+ 3 4- / 3+   Hip abduction 4- 3 4 / 3+   Hip adduction      Hip internal rotation      Hip external rotation      Knee flexion 5 5    Knee extension 5 5 (at 10 deg knee flex)    Ankle dorsiflexion      Ankle plantarflexion      Ankle inversion      Ankle eversion       (Blank rows = not tested)  LOWER EXTREMITY SPECIAL TESTS:  Hip special tests: Jaime Rose (FABER) test: negative, Trendelenburg test: positive , Ober's test: positive , and Ely's test: negative  FUNCTIONAL TESTS:  SLS: 5 sec R, 1 sec L Squatting demos increased L knee valgus, R weight shift, decreased knee flexion to ~60 deg -- pt compensates into Rose lunge if she has to squat lower Stair descent demos poor eccentric quad strength with increased valgus  06/19/22: SLS 8 sec R, 4 sec L 07/11/22: SLS  10s R, 10s Left (after several tries) -stair descent continues with fair eccentric quad strength. (Improved)  GAIT: Distance walked: 100' Assistive device utilized: None Level of assistance: Complete Independence Comments: L foot externally rotated, knee slightly flexed during stance, antalgic, compensated trendelenburg with trunk lean during stance phase  07/11/22: Pt able to control compensated trendelenburg decreasing trunk lean with conscious effort although will divert into when tired/fatigued ~530ft.  Continued left foot ER with return to old gait pattern   [facility] Adult PT Treatment:     3/15 Manual: PA and AP glides; patella mobilization.    SLR 2x15 2lbs        Step up 6 inch 20  Lateral step up 6 inch x20  Step down 4 inch inch with control 2x10    Reviewed going down steps on Rose step    Leg press  2x15 50 lbs  07/11/22 Pt seen for aquatic therapy today.  Treatment took place in water 3.25-4.5 ft in depth at the [place]. Temp of water was 91.  Pt entered/exited the pool via stairs independently with bilat rail.  * gait unsupported:  forward/ backward and side stepping * Lt hamstring stretch with foot on 3rd step x 30s x 2, gentle overpressure from pt into knee ext  * SLS with solid noodle leg press,  hip in neutral x 10, then abdct x10 -(1 sets slow, 1 set fast), no UE support * hip hinge with forward arm reach on noodle x 10;  SLS forward leans holding noodle  x 10 each *SLS R/L completed submerged to bottom 3.6 ft then bottom step R/L able to hold >20s  Pt requires the buoyancy and hydrostatic pressure of water for support, and to offload joints by unweighting joint load by at least 50 % in navel deep water and by at least 75-80% in chest to neck deep water.  Viscosity of the water is needed for resistance of strengthening. Water current perturbations provides challenge to standing balance requiring increased core activation  Objective testing  3/1  Manual: PA and AP glides; patella mobilization.    SLR 2x15 no weight and 1 lb      Step up 6 inch 20  Lateral step up 6 inch x20  Step down 2 inch with control 2x10    Knee to stand 5x with chair and air-ex to take pressure off the knee.    Leg press 2x15 50 lbs    Squat at sink with cuing for technique 2x10       PATIENT EDUCATION:  Education details: aquatic progressions/ modifications  Person educated: Patient Education method: Education officer, environmental,  Education comprehension: verbalized understanding, returned demonstration, and needs further education  HOME EXERCISE PROGRAM: Access Code:   URL: [email or web address] Date: 05/16/2022 Prepared by: Estill Bamberg April Thurnell Garbe  Exercises - Seated Hamstring Stretch  - 2 x daily - 7 x weekly - 2 sets - 30 sec hold - Seated Quad Set   - 2 x daily - 7 x weekly - 2 sets - 10 reps - 3 sec hold - Supine ITB Stretch with Strap  - 1 x daily - 7 x weekly - 2 sets - 30 sec hold - Clamshell with Resistance  - 1 x daily - 7 x weekly - 2 sets - 10 reps - Supine Heel Slide with Strap  - 1 x daily - 7 x weekly - 2 sets - 10 reps - Seated Straight Leg Raise with Support  - 1 x daily - 7 x weekly - 2 sets - 10 reps - Supine March  - 1 x daily - 7 x weekly - 2 sets - 10 reps - Supine Bridge  - 1 x daily - 7 x weekly - 2 sets - 10 reps - 3 sec hold  ASSESSMENT:  CLINICAL IMPRESSION: Therapy continues to progress patient's stair training She is tolerating well. Per visual inspection the patient's extension is improving. Therapy will continue to progress patient as tolerated. We also worked on instability today. She had no significant difficulty.    OBJECTIVE IMPAIRMENTS: Abnormal gait, decreased balance, decreased endurance, decreased mobility, difficulty walking, decreased ROM, decreased strength, hypomobility, increased fascial restrictions, impaired flexibility, postural dysfunction, and pain.    GOALS: Goals reviewed with patient? Yes  SHORT TERM GOALS: Target date: 06/07/2022  Pt will be ind with initial HEP Baseline: Goal status: Met 06/12/22  2.  Pt will demo L knee ROM >/= 5-105 deg for improved stair ascent/descent Baseline:  Goal status: Met 06/12/22    LONG TERM GOALS: Target date: 07/05/2022   Pt will be ind with progression and advancement of HEP Baseline:  Goal status: initial  2.  Pt will demo L knee ROM of >/= 5 - 115 deg for improved stair/hill ascent/descent Baseline:  Goal status: Met 07/11/22  3.  Pt will be able to demo R & L SLS of >/=10 sec to show improved bilat LE stability Baseline: <5 sec Goal status: Partially met (not consist) 07/11/22  4.  Pt will be able  to squat at least 20 lower than 70 deg of knee flexion to demo improved functional LE strength for lifting and carrying Baseline: Currently  performs with compensation and pain Goal status: ongoing 07/11/22  5.  Pt will demo normal reciprocal gait pattern without compensated trunk lean x1000' for community mobility Baseline:  Goal status: progressing 07/11/22  6.  Pt will have improved [name] score to >/=73 Baseline:  Goal status: Met 07/11/22   PLAN:  PT FREQUENCY: 1-2xweek  PT DURATION: 4 weeks  PLANNED INTERVENTIONS: Therapeutic exercises, Therapeutic activity, Neuromuscular re-education, Balance training, Gait training, Patient/Family education, Self Care, Joint mobilization, Stair training, Orthotic/Fit training, Aquatic Therapy, Dry Needling, Electrical stimulation, Cryotherapy, Moist heat, Taping, Vasopneumatic device, Ultrasound, Ionotophoresis 4mg /ml Dexamethasone, Manual therapy, and Re-evaluation  PLAN FOR NEXT SESSION: Work on knee ROM. Quad and glute stretching and strengthening. Progress to closed chain exercises as tolerated.    Carolyne Littles PT DPT  07/13/22 4:05 PM [facility] [address] [place], [place], [address] Phone: [phone]   Fax:  [phone]   Referring diagnosis? M17.12 (ICD-10-CM) - Unilateral primary osteoarthritis, left knee Treatment diagnosis? (if different than referring diagnosis) M25.562 Chronic pain of Left Knee What was this (referring dx) caused by? []  Surgery []  Fall [x]  Ongoing issue [x]  Arthritis []  Other: ____________   Laterality: []  Rt [x]  Lt []  Both   Check all possible CPT codes:                   *CHOOSE 10 OR LESS*                                [x]  97110 (Therapeutic Exercise)                   []  92507 (SLP Treatment)      [x]  97112 (Neuro Re-ed)                                []  92526 (Swallowing Treatment)                  [x]  97116 (Gait Training)                                []  [id number] (Cognitive Training, 1st 15 minutes) [x]  97140 (Manual Therapy)                           []  97130 (Cognitive  Training, each add'l 15 minutes)         [x]  97164 (Re-evaluation)                              []  Other, List CPT Code ____________  [x]  [id number] (Therapeutic Activities)                                            [x]  97535 (Self Care)               [x]  All codes above (97110 - 97535)             []  [id number] (Mechanical Traction)             [  x] 97014 (E-stim Unattended)             []  97032 (E-stim manual)             [x]  97033 (Ionto)             []  97035 (Ultrasound) []  97750 (Physical Performance Training) [x]  [id number] (Aquatic Therapy) [x]  97016 (Vasopneumatic Device) []  [id number] (Paraffin) []  97034 (Contrast Bath) []  97597 (Wound Care 1st 20 sq cm) []  97598 (Wound Care each add'l 20 sq cm) []  97760 (Orthotic Fabrication, Fitting, Training Initial) []  [id number] (Prosthetic Management and Training Initial) []  [id number] (Orthotic or Prosthetic Training/ Modification Subsequent)

## 2022-07-17 ENCOUNTER — Ambulatory Visit (HOSPITAL_BASED_OUTPATIENT_CLINIC_OR_DEPARTMENT_OTHER): Payer: Medicare PPO | Admitting: Physical Therapy

## 2022-07-17 ENCOUNTER — Encounter (HOSPITAL_BASED_OUTPATIENT_CLINIC_OR_DEPARTMENT_OTHER): Payer: Self-pay | Admitting: Physical Therapy

## 2022-07-17 DIAGNOSIS — M25662 Stiffness of left knee, not elsewhere classified: Secondary | ICD-10-CM

## 2022-07-17 DIAGNOSIS — G8929 Other chronic pain: Secondary | ICD-10-CM

## 2022-07-17 DIAGNOSIS — M6281 Muscle weakness (generalized): Secondary | ICD-10-CM

## 2022-07-17 DIAGNOSIS — R2689 Other abnormalities of gait and mobility: Secondary | ICD-10-CM

## 2022-07-17 DIAGNOSIS — M25562 Pain in left knee: Secondary | ICD-10-CM | POA: Diagnosis not present

## 2022-07-17 NOTE — Therapy (Signed)
OUTPATIENT PHYSICAL THERAPY TREATMENT    Patient Name: Jaime Rose MRN: [id number] DOB:08/20/1950, 72 y.o., female Today's Date: 07/17/2022  END OF SESSION:  PT End of Session - 07/17/22 1529     Visit Number 17    Number of Visits 21    Date for PT Re-Evaluation 08/09/22    Authorization Type humana    PT Start Time 1529    PT Stop Time 1612    PT Time Calculation (min) 43 min    Activity Tolerance Patient tolerated treatment well               Past Medical History:  Diagnosis Date   ALLERGIC RHINITIS    [place], HX OF 12/21/2009   Qualifier: Diagnosis of  By: Asa Lente MD, Valerie A    Hyperthyroidism    normal TFTs 11/2009 , dx 10/2011   Varicose vein    s/p sclerosis tx summer 2013   Past Surgical History:  Procedure Laterality Date   ENDOVENOUS ABLATION SAPHENOUS VEIN W/ LASER  04-11-2012   endovenous laser ablation right greater saphenous vein and stab phlebectomy  right leg  by Curt Jews MD   Patient Active Problem List   Diagnosis Date Noted   Foot pain, left 06/02/2021   Ptosis of right eyelid 04/12/2020   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 12/29/2018   Osteopenia 01/10/2017   Right carotid bruit 12/21/2016   Prediabetes 12/21/2016   Hypothyroidism 03/20/2012   Varicose veins of lower extremities with other complications 81/19/1478   ALLERGIC RHINITIS 12/21/2009   Cardiac murmur 12/21/2009    PCP: Billey Gosling  REFERRING PROVIDER: Vanetta Mulders, MD  REFERRING DIAG: [phone] (ICD-10-CM) - Unilateral primary osteoarthritis, left knee  THERAPY DIAG:  Chronic pain of left knee  Stiffness of left knee, not elsewhere classified  Muscle weakness (generalized)  Other abnormalities of gait and mobility  Rationale for Evaluation and Treatment: Rehabilitation  ONSET DATE: Multiple years  SUBJECTIVE:   SUBJECTIVE STATEMENT: Pt reports she had a good session on land, "I had muscle fatigue this weekend, but not bad".    PERTINENT  HISTORY: From PT order: Possible aquatic candidate  Hip girdle strengthening and LE strengthening for OA From eval: Pt states her knee has been a problem for years. Pt notes that knee was exacerbated in Feb when her foot was swollen (cause is unknown). Pt points to pain primarily along the lateral knee. Pt states it's not excruciating but if she exerts herself she can feel the pain (I.e. going to [place] and going up/down the stairs and hills). Pt with plans to go to [place] in April. It does get stiff over night. Pt states she is doing HEP for her foot/ankle that she was given by PT.   PAIN:  Are you having pain? yes: NPRS scale: 1.5/10, "just discomfort" Pain location: L anterior lateral knee joint line Pain description: Dull/achy Aggravating factors: Knee bending, descending stairs Relieving factors: Ibuprofen,   PRECAUTIONS: None  WEIGHT BEARING RESTRICTIONS: No  FALLS:  Has patient fallen in last 6 months? No  LIVING ENVIRONMENT: Lives with: lives alone Lives in: House/apartment Stairs: Yes: Internal: 14-15 steps; can reach both and External: 4-5 steps; can reach both Has following equipment at home: None  OCCUPATION: Nutritionist  PATIENT GOALS: Decrease the "waddle", go up/down stairs   NEXT MD VISIT:   OBJECTIVE:   DIAGNOSTIC FINDINGS: x-ray performed 05/09/22 -- results pending.  PATIENT SURVEYS:  [name] 65; predicted 73  MUSCLE LENGTH: Hamstrings: Right 80  deg; Left <60 deg (likely due to reduced knee ROM) Thomas test: WFL  POSTURE:   Increased knee valgus with Q angle >20 deg on L (greater in standing than supine). Patellar malalignment also noted -- appears slightly lateral to center in supine. R iliac crest higher than L in standing  PALPATION: TTP L lateral knee and glute med. Hypomobile patellar mobility  LOWER EXTREMITY ROM:  Active ROM Right eval Left eval Left 1/31 Left 06/12/20 Left 07/11/22  Knee flexion 120 100 N/A 116 Seated 115 seated  Knee  extension 0 -8 -5 -5 -3   (Blank rows = not tested)  LOWER EXTREMITY MMT:  MMT Right eval Left eval Right / Left 06/19/22 Right / Left 07/11/22  Hip flexion 4+ 4- 5- / 4 5 / 4+  Hip extension 3+ 3 4- / 3+   Hip abduction 4- 3 4 / 3+   Hip adduction      Hip internal rotation      Hip external rotation      Knee flexion 5 5    Knee extension 5 5 (at 10 deg knee flex)    Ankle dorsiflexion      Ankle plantarflexion      Ankle inversion      Ankle eversion       (Blank rows = not tested)  LOWER EXTREMITY SPECIAL TESTS:  Hip special tests: [name] (FABER) test: negative, Trendelenburg test: positive , Ober's test: positive , and Ely's test: negative  FUNCTIONAL TESTS:  SLS: 5 sec R, 1 sec L Squatting demos increased L knee valgus, R weight shift, decreased knee flexion to ~60 deg -- pt compensates into a lunge if she has to squat lower Stair descent demos poor eccentric quad strength with increased valgus  06/19/22: SLS 8 sec R, 4 sec L 07/11/22: SLS  10s R, 10s Left (after several tries) -stair descent continues with fair eccentric quad strength. (Improved)  GAIT: Distance walked: 100' Assistive device utilized: None Level of assistance: Complete Independence Comments: L foot externally rotated, knee slightly flexed during stance, antalgic, compensated trendelenburg with trunk lean during stance phase  07/11/22: Pt able to control compensated trendelenburg decreasing trunk lean with conscious effort although will divert into when tired/fatigued ~559ft.  Continued left foot ER with return to old gait pattern   [facility] Adult PT Treatment:     07/17/22 Pt seen for aquatic therapy today.  Treatment took place in water 3.25-4 ft in depth at the [facility] pool. Temp of water was 91.  Pt entered/exited the pool via stairs independently with bilat rail.  * gait unsupported:  forward/ backward and side stepping * Lt hamstring stretch with foot on 3rd step x 30s x 2, gentle  overpressure from pt into knee ext - cues to square off hips * return to walking with cues for vertical trunk * SLS with hip abdct/addct x 30-40 each LE (no UE support), cues for foot alignment * Rt forward step down, Lt retro step up x 15, with UE on rails * SLS with solid noodle leg press,  hip in neutral x 10 LLE slow * back against wall with solid noodle at ankle- straight leg pull downs x 10 to front/ x 10 in adduction each LE * forward walking kick (hip flexion to LAQ) * tandem stance with hands against legs and horiz head turns ("slowly scan room") * tandem gait forward/backward without UE support, slow and controlled * STS at bench in water with feet  on blue step x 5;  STS on 4th step with UE support on return to sitting x 3 (challenge)  Pt requires the buoyancy and hydrostatic pressure of water for support, and to offload joints by unweighting joint load by at least 50 % in navel deep water and by at least 75-80% in chest to neck deep water.  Viscosity of the water is needed for resistance of strengthening. Water current perturbations provides challenge to standing balance requiring increased core activation    3/15 Manual: PA and AP glides; patella mobilization.   SLR 2x15 2lbs   Step up 6 inch 20  Lateral step up 6 inch x20  Step down 4 inch inch with control 2x10  Reviewed going down steps on a step  Leg press 2x15 50 lbs      PATIENT EDUCATION:  Education details: aquatic progressions/ modifications  Person educated: Patient Education method: Education officer, environmental,  Education comprehension: verbalized understanding, returned demonstration, and needs further education  HOME EXERCISE PROGRAM: Access Code:   URL: [email or web address] Date: 05/16/2022 Prepared by: Estill Bamberg April Thurnell Garbe  Exercises - Seated Hamstring Stretch  - 2 x daily - 7 x weekly - 2 sets - 30 sec hold - Seated Quad Set  - 2 x daily - 7 x weekly - 2 sets - 10 reps - 3 sec  hold - Supine ITB Stretch with Strap  - 1 x daily - 7 x weekly - 2 sets - 30 sec hold - Clamshell with Resistance  - 1 x daily - 7 x weekly - 2 sets - 10 reps - Supine Heel Slide with Strap  - 1 x daily - 7 x weekly - 2 sets - 10 reps - Seated Straight Leg Raise with Support  - 1 x daily - 7 x weekly - 2 sets - 10 reps - Supine March  - 1 x daily - 7 x weekly - 2 sets - 10 reps - Supine Bridge  - 1 x daily - 7 x weekly - 2 sets - 10 reps - 3 sec hold  ASSESSMENT:  CLINICAL IMPRESSION: Pt reported muscle fatigue in LE and slight increase in Lt knee discomfort to 2/10 at end of session.   She was able to complete STS on 4th step, working towards LTG #4. May try trial of ktape to lateral portion of Lt knee at point of pain, to decompress tissue and increase proprioception - next visit.  Pt progressing well towards remaining goals.   OBJECTIVE IMPAIRMENTS: Abnormal gait, decreased balance, decreased endurance, decreased mobility, difficulty walking, decreased ROM, decreased strength, hypomobility, increased fascial restrictions, impaired flexibility, postural dysfunction, and pain.    GOALS: Goals reviewed with patient? Yes  SHORT TERM GOALS: Target date: 06/07/2022  Pt will be ind with initial HEP Baseline: Goal status: Met 06/12/22  2.  Pt will demo L knee ROM >/= 5-105 deg for improved stair ascent/descent Baseline:  Goal status: Met 06/12/22    LONG TERM GOALS: Target date: 07/05/2022   Pt will be ind with progression and advancement of HEP Baseline:  Goal status: initial  2.  Pt will demo L knee ROM of >/= 5 - 115 deg for improved stair/hill ascent/descent Baseline:  Goal status: Met 07/11/22  3.  Pt will be able to demo R & L SLS of >/=10 sec to show improved bilat LE stability Baseline: <5 sec Goal status: Partially met (not consist) 07/11/22  4.  Pt will be able to squat at least 20  lower than 70 deg of knee flexion to demo improved functional LE strength for lifting and  carrying Baseline: Currently performs with compensation and pain Goal status: ongoing 07/11/22  5.  Pt will demo normal reciprocal gait pattern without compensated trunk lean x1000' for community mobility Baseline:  Goal status: progressing 07/11/22  6.  Pt will have improved [name] score to >/=73 Baseline:  Goal status: Met 07/11/22   PLAN:  PT FREQUENCY: 1-2xweek  PT DURATION: 4 weeks  PLANNED INTERVENTIONS: Therapeutic exercises, Therapeutic activity, Neuromuscular re-education, Balance training, Gait training, Patient/Family education, Self Care, Joint mobilization, Stair training, Orthotic/Fit training, Aquatic Therapy, Dry Needling, Electrical stimulation, Cryotherapy, Moist heat, Taping, Vasopneumatic device, Ultrasound, Ionotophoresis 4mg /ml Dexamethasone, Manual therapy, and Re-evaluation  PLAN FOR NEXT SESSION: Work on knee ROM. Quad and glute stretching and strengthening. Progress to closed chain exercises as tolerated.   Kerin Perna, PTA 07/17/22 4:15 PM [facility] [address] [place], [place], [address] Phone: [phone]   Fax:  [phone]  Referring diagnosis? M17.12 (ICD-10-CM) - Unilateral primary osteoarthritis, left knee Treatment diagnosis? (if different than referring diagnosis) M25.562 Chronic pain of Left Knee What was this (referring dx) caused by? []  Surgery []  Fall [x]  Ongoing issue [x]  Arthritis []  Other: ____________   Laterality: []  Rt [x]  Lt []  Both   Check all possible CPT codes:                   *CHOOSE 10 OR LESS*                                [x]  97110 (Therapeutic Exercise)                   []  92507 (SLP Treatment)      [x]  97112 (Neuro Re-ed)                                []  92526 (Swallowing Treatment)                  [x]  97116 (Gait Training)                                []  [id number] (Cognitive Training, 1st 15 minutes) [x]  97140 (Manual Therapy)                            []  97130 (Cognitive Training, each add'l 15 minutes)         [x]  97164 (Re-evaluation)                              []  Other, List CPT Code ____________  [x]  [id number] (Therapeutic Activities)                                            [x]  [id number] (Self Care)               [x]  All codes above (97110 - 97535)             []  97012 (Mechanical Traction)             [  x] 97014 (E-stim Unattended)             []  97032 (E-stim manual)             [x]  97033 (Ionto)             []  97035 (Ultrasound) []  97750 (Physical Performance Training) [x]  [id number] (Aquatic Therapy) [x]  97016 (Vasopneumatic Device) []  [id number] (Paraffin) []  97034 (Contrast Bath) []  97597 (Wound Care 1st 20 sq cm) []  97598 (Wound Care each add'l 20 sq cm) []  97760 (Orthotic Fabrication, Fitting, Training Initial) []  [id number] (Prosthetic Management and Training Initial) []  [id number] (Orthotic or Prosthetic Training/ Modification Subsequent)

## 2022-07-26 ENCOUNTER — Ambulatory Visit (HOSPITAL_BASED_OUTPATIENT_CLINIC_OR_DEPARTMENT_OTHER): Payer: Medicare PPO | Admitting: Physical Therapy

## 2022-07-26 DIAGNOSIS — M25662 Stiffness of left knee, not elsewhere classified: Secondary | ICD-10-CM

## 2022-07-26 DIAGNOSIS — M25562 Pain in left knee: Secondary | ICD-10-CM | POA: Diagnosis not present

## 2022-07-26 DIAGNOSIS — G8929 Other chronic pain: Secondary | ICD-10-CM

## 2022-07-26 DIAGNOSIS — M6281 Muscle weakness (generalized): Secondary | ICD-10-CM

## 2022-07-26 DIAGNOSIS — R2689 Other abnormalities of gait and mobility: Secondary | ICD-10-CM

## 2022-07-26 NOTE — Therapy (Signed)
OUTPATIENT PHYSICAL THERAPY TREATMENT    Patient Name: Jaime Rose MRN: [id number] DOB:01/22/1951, 72 y.o., female Today's Date: 07/17/2022  END OF SESSION:  PT End of Session - 07/17/22 1529     Visit Number 17    Number of Visits 21    Date for PT Re-Evaluation 08/09/22    Authorization Type humana    PT Start Time 1529    PT Stop Time 1612    PT Time Calculation (min) 43 min    Activity Tolerance Patient tolerated treatment well               Past Medical History:  Diagnosis Date   ALLERGIC RHINITIS    [place], HX OF 12/21/2009   Qualifier: Diagnosis of  By: Asa Lente MD, Valerie A    Hyperthyroidism    normal TFTs 11/2009 , dx 10/2011   Varicose vein    s/p sclerosis tx summer 2013   Past Surgical History:  Procedure Laterality Date   ENDOVENOUS ABLATION SAPHENOUS VEIN W/ LASER  04-11-2012   endovenous laser ablation right greater saphenous vein and stab phlebectomy  right leg  by Curt Jews MD   Patient Active Problem List   Diagnosis Date Noted   Foot pain, left 06/02/2021   Ptosis of right eyelid 04/12/2020   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 12/29/2018   Osteopenia 01/10/2017   Right carotid bruit 12/21/2016   Prediabetes 12/21/2016   Hypothyroidism 03/20/2012   Varicose veins of lower extremities with other complications [id number]   ALLERGIC RHINITIS 12/21/2009   Cardiac murmur 12/21/2009    PCP: Billey Gosling  REFERRING PROVIDER: Vanetta Mulders, MD  REFERRING DIAG: [phone] (ICD-10-CM) - Unilateral primary osteoarthritis, left knee  THERAPY DIAG:  Chronic pain of left knee  Stiffness of left knee, not elsewhere classified  Muscle weakness (generalized)  Other abnormalities of gait and mobility  Rationale for Evaluation and Treatment: Rehabilitation  ONSET DATE: Multiple years  SUBJECTIVE:   SUBJECTIVE STATEMENT: Patient has been focusing on her gait and has been feeling better. She feels like she needs to work on steps.   PERTINENT HISTORY: From PT order: Possible aquatic candidate  Hip girdle strengthening and LE strengthening for OA From eval: Pt states her knee has been a problem for years. Pt notes that knee was exacerbated in Feb when her foot was swollen (cause is unknown). Pt points to pain primarily along the lateral knee. Pt states it's not excruciating but if she exerts herself she can feel the pain (I.e. going to [place] and going up/down the stairs and hills). Pt with plans to go to [place] in April. It does get stiff over night. Pt states she is doing HEP for her foot/ankle that she was given by PT.   PAIN:  Are you having pain? yes: NPRS scale: 1.5/10, "just discomfort" Pain location: L anterior lateral knee joint line Pain description: Dull/achy Aggravating factors: Knee bending, descending stairs Relieving factors: Ibuprofen,   PRECAUTIONS: None  WEIGHT BEARING RESTRICTIONS: No  FALLS:  Has patient fallen in last 6 months? No  LIVING ENVIRONMENT: Lives with: lives alone Lives in: House/apartment Stairs: Yes: Internal: 14-15 steps; can reach both and External: 4-5 steps; can reach both Has following equipment at home: None  OCCUPATION: Nutritionist  PATIENT GOALS: Decrease the "waddle", go up/down stairs   NEXT MD VISIT:   OBJECTIVE:   DIAGNOSTIC FINDINGS: x-ray performed 05/09/22 -- results pending.  PATIENT SURVEYS:  [name] 65; predicted 15  MUSCLE LENGTH: Hamstrings: Right  80 deg; Left <60 deg (likely due to reduced knee ROM) Thomas test: WFL  POSTURE:   Increased knee valgus with Q angle >20 deg on L (greater in standing than supine). Patellar malalignment also noted -- appears slightly lateral to center in supine. R iliac crest higher than L in standing  PALPATION: TTP L lateral knee and glute med. Hypomobile patellar mobility  LOWER EXTREMITY ROM:  Active ROM Right eval Left eval Left 1/31 Left 06/12/20 Left 07/11/22  Knee flexion 120 100 N/A 116 Seated 115  seated  Knee extension 0 -8 -5 -5 -3   (Blank rows = not tested)  LOWER EXTREMITY MMT:  MMT Right eval Left eval Right / Left 06/19/22 Right / Left 07/11/22  Hip flexion 4+ 4- 5- / 4 5 / 4+  Hip extension 3+ 3 4- / 3+   Hip abduction 4- 3 4 / 3+   Hip adduction      Hip internal rotation      Hip external rotation      Knee flexion 5 5    Knee extension 5 5 (at 10 deg knee flex)    Ankle dorsiflexion      Ankle plantarflexion      Ankle inversion      Ankle eversion       (Blank rows = not tested)  LOWER EXTREMITY SPECIAL TESTS:  Hip special tests: [name] (FABER) test: negative, Trendelenburg test: positive , Ober's test: positive , and Ely's test: negative  FUNCTIONAL TESTS:  SLS: 5 sec R, 1 sec L Squatting demos increased L knee valgus, R weight shift, decreased knee flexion to ~60 deg -- pt compensates into a lunge if she has to squat lower Stair descent demos poor eccentric quad strength with increased valgus  06/19/22: SLS 8 sec R, 4 sec L 07/11/22: SLS  10s R, 10s Left (after several tries) -stair descent continues with fair eccentric quad strength. (Improved)  GAIT: Distance walked: 100' Assistive device utilized: None Level of assistance: Complete Independence Comments: L foot externally rotated, knee slightly flexed during stance, antalgic, compensated trendelenburg with trunk lean during stance phase  07/11/22: Pt able to control compensated trendelenburg decreasing trunk lean with conscious effort although will divert into when tired/fatigued ~57ft.  Continued left foot ER with return to old gait pattern   [facility] Adult PT Treatment:     07/26/22 Nu step x65mins Manual: knee extension stretching; trigger point release to hamstring; grade II and III PA and AP mobilization   SLR 2x15 2lbs   Step up 6 inch 20  Lateral step up 6 inch x20  Step down 4 inch inch with control 2x10  2 inch eccentric step down 2x10   Heel/toe x20   Slow march 2x10       07/17/22 Pt seen for aquatic therapy today.  Treatment took place in water 3.25-4 ft in depth at the [facility] pool. Temp of water was 91.  Pt entered/exited the pool via stairs independently with bilat rail.  * gait unsupported:  forward/ backward and side stepping * Lt hamstring stretch with foot on 3rd step x 30s x 2, gentle overpressure from pt into knee ext - cues to square off hips * return to walking with cues for vertical trunk * SLS with hip abdct/addct x 30-40 each LE (no UE support), cues for foot alignment * Rt forward step down, Lt retro step up x 15, with UE on rails * SLS with solid noodle leg press,  hip in neutral x 10 LLE slow * back against wall with solid noodle at ankle- straight leg pull downs x 10 to front/ x 10 in adduction each LE * forward walking kick (hip flexion to LAQ) * tandem stance with hands against legs and horiz head turns ("slowly scan room") * tandem gait forward/backward without UE support, slow and controlled * STS at bench in water with feet on blue step x 5;  STS on 4th step with UE support on return to sitting x 3 (challenge)  Pt requires the buoyancy and hydrostatic pressure of water for support, and to offload joints by unweighting joint load by at least 50 % in navel deep water and by at least 75-80% in chest to neck deep water.  Viscosity of the water is needed for resistance of strengthening. Water current perturbations provides challenge to standing balance requiring increased core activation    3/15 Manual: PA and AP glides; patella mobilization.   SLR 2x15 2lbs   Step up 6 inch 20  Lateral step up 6 inch x20  Step down 4 inch inch with control 2x10  Reviewed going down steps on a step  Leg press 2x15 50 lbs      PATIENT EDUCATION:  Education details: aquatic progressions/ modifications  Person educated: Patient Education method: Education officer, environmental,  Education comprehension: verbalized understanding,  returned demonstration, and needs further education  HOME EXERCISE PROGRAM: Access Code:   URL: [email or web address] Date: 05/16/2022 Prepared by: Estill Bamberg April Thurnell Garbe  Exercises - Seated Hamstring Stretch  - 2 x daily - 7 x weekly - 2 sets - 30 sec hold - Seated Quad Set  - 2 x daily - 7 x weekly - 2 sets - 10 reps - 3 sec hold - Supine ITB Stretch with Strap  - 1 x daily - 7 x weekly - 2 sets - 30 sec hold - Clamshell with Resistance  - 1 x daily - 7 x weekly - 2 sets - 10 reps - Supine Heel Slide with Strap  - 1 x daily - 7 x weekly - 2 sets - 10 reps - Seated Straight Leg Raise with Support  - 1 x daily - 7 x weekly - 2 sets - 10 reps - Supine March  - 1 x daily - 7 x weekly - 2 sets - 10 reps - Supine Bridge  - 1 x daily - 7 x weekly - 2 sets - 10 reps - 3 sec hold  ASSESSMENT:  CLINICAL IMPRESSION: The patient continues to progress well. She had no pain with stair training. We continue to work on improving extension. We will also focus on going down steps.  OBJECTIVE IMPAIRMENTS: Abnormal gait, decreased balance, decreased endurance, decreased mobility, difficulty walking, decreased ROM, decreased strength, hypomobility, increased fascial restrictions, impaired flexibility, postural dysfunction, and pain.    GOALS: Goals reviewed with patient? Yes  SHORT TERM GOALS: Target date: 06/07/2022  Pt will be ind with initial HEP Baseline: Goal status: Met 06/12/22  2.  Pt will demo L knee ROM >/= 5-105 deg for improved stair ascent/descent Baseline:  Goal status: Met 06/12/22    LONG TERM GOALS: Target date: 07/05/2022   Pt will be ind with progression and advancement of HEP Baseline:  Goal status: initial  2.  Pt will demo L knee ROM of >/= 5 - 115 deg for improved stair/hill ascent/descent Baseline:  Goal status: Met 07/11/22  3.  Pt will be able to demo R & L  SLS of >/=10 sec to show improved bilat LE stability Baseline: <5 sec Goal status:  Partially met (not consist) 07/11/22  4.  Pt will be able to squat at least 20 lower than 70 deg of knee flexion to demo improved functional LE strength for lifting and carrying Baseline: Currently performs with compensation and pain Goal status: ongoing 07/11/22  5.  Pt will demo normal reciprocal gait pattern without compensated trunk lean x1000' for community mobility Baseline:  Goal status: progressing 07/11/22  6.  Pt will have improved [name] score to >/=73 Baseline:  Goal status: Met 07/11/22   PLAN:  PT FREQUENCY: 1-2xweek  PT DURATION: 4 weeks  PLANNED INTERVENTIONS: Therapeutic exercises, Therapeutic activity, Neuromuscular re-education, Balance training, Gait training, Patient/Family education, Self Care, Joint mobilization, Stair training, Orthotic/Fit training, Aquatic Therapy, Dry Needling, Electrical stimulation, Cryotherapy, Moist heat, Taping, Vasopneumatic device, Ultrasound, Ionotophoresis 4mg /ml Dexamethasone, Manual therapy, and Re-evaluation  PLAN FOR NEXT SESSION: Work on knee ROM. Quad and glute stretching and strengthening. Progress to closed chain exercises as tolerated.   Kerin Perna, PTA 07/17/22 4:15 PM [facility] [address] [place], [place], [address] Phone: [phone]   Fax:  [phone]  Referring diagnosis? M17.12 (ICD-10-CM) - Unilateral primary osteoarthritis, left knee Treatment diagnosis? (if different than referring diagnosis) M25.562 Chronic pain of Left Knee What was this (referring dx) caused by? []  Surgery []  Fall [x]  Ongoing issue [x]  Arthritis []  Other: ____________   Laterality: []  Rt [x]  Lt []  Both   Check all possible CPT codes:                   *CHOOSE 10 OR LESS*                                [x]  97110 (Therapeutic Exercise)                   []  92507 (SLP Treatment)      [x]  97112 (Neuro Re-ed)                                []  92526 (Swallowing Treatment)                   [x]  [id number] (Gait Training)                                []  [id number] (Cognitive Training, 1st 15 minutes) [x]  97140 (Manual Therapy)                           []  97130 (Cognitive Training, each add'l 15 minutes)         [x]  97164 (Re-evaluation)                              []  Other, List CPT Code ____________  [x]  [id number] (Therapeutic Activities)                                            [x]  97535 (Self Care)               [x]   All codes above (97110 - 97535)             []  97012 (Mechanical Traction)             [x]  97014 (E-stim Unattended)             []  97032 (E-stim manual)             [x]  97033 (Ionto)             []  97035 (Ultrasound) []  97750 (Physical Performance Training) [x]  [id number] (Aquatic Therapy) [x]  97016 (Vasopneumatic Device) []  [id number] (Paraffin) []  97034 (Contrast Bath) []  97597 (Wound Care 1st 20 sq cm) []  97598 (Wound Care each add'l 20 sq cm) []  97760 (Orthotic Fabrication, Fitting, Training Initial) []  [id number] (Prosthetic Management and Training Initial) []  [id number] (Orthotic or Prosthetic Training/ Modification Subsequent)

## 2022-07-29 NOTE — Therapy (Signed)
OUTPATIENT PHYSICAL THERAPY TREATMENT    Patient Name: Jaime Rose MRN: [id number] DOB:Jul 01, 1950, 72 y.o., female Today's Date: 07/30/2022  END OF SESSION:  PT End of Session - 07/30/22 1654     Visit Number 19    Number of Visits 21    Date for PT Re-Evaluation 08/09/22    Authorization Type humana    PT Start Time 1630    PT Stop Time 1715    PT Time Calculation (min) 45 min    Activity Tolerance Patient tolerated treatment well    Behavior During Therapy WFL for tasks assessed/performed               Past Medical History:  Diagnosis Date   ALLERGIC RHINITIS    [place], HX OF 12/21/2009   Qualifier: Diagnosis of  By: Felicity Coyer MD, Valerie A    Hyperthyroidism    normal TFTs 11/2009 , dx 10/2011   Varicose vein    s/p sclerosis tx summer 2013   Past Surgical History:  Procedure Laterality Date   ENDOVENOUS ABLATION SAPHENOUS VEIN W/ LASER  04-11-2012   endovenous laser ablation right greater saphenous vein and stab phlebectomy  right leg  by Gretta Began MD   Patient Active Problem List   Diagnosis Date Noted   Foot pain, left 06/02/2021   Ptosis of right eyelid 04/12/2020   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 12/29/2018   Osteopenia 01/10/2017   Right carotid bruit 12/21/2016   Prediabetes 12/21/2016   Hypothyroidism 03/20/2012   Varicose veins of lower extremities with other complications 12/21/2011   ALLERGIC RHINITIS 12/21/2009   Cardiac murmur 12/21/2009    PCP: Cheryll Cockayne  REFERRING PROVIDER: Huel Cote, MD  REFERRING DIAG: [phone] (ICD-10-CM) - Unilateral primary osteoarthritis, left knee  THERAPY DIAG:  Chronic pain of left knee  Stiffness of left knee, not elsewhere classified  Muscle weakness (generalized)  Other abnormalities of gait and mobility  Rationale for Evaluation and Treatment: Rehabilitation  ONSET DATE: Multiple years  SUBJECTIVE:   SUBJECTIVE STATEMENT: I have learned that if my knee is hurting to correct my  posture and positioning and it makes it better.  PERTINENT HISTORY: From PT order: Possible aquatic candidate  Hip girdle strengthening and LE strengthening for OA From eval: Pt states her knee has been a problem for years. Pt notes that knee was exacerbated in Feb when her foot was swollen (cause is unknown). Pt points to pain primarily along the lateral knee. Pt states it's not excruciating but if she exerts herself she can feel the pain (I.e. going to [place] and going up/down the stairs and hills). Pt with plans to go to [place] in April. It does get stiff over night. Pt states she is doing HEP for her foot/ankle that she was given by PT.   PAIN:  Are you having pain? yes: NPRS scale: .5/10, "just discomfort" Pain location: L anterior lateral knee joint line Pain description: Dull/achy Aggravating factors: Knee bending, descending stairs Relieving factors: Ibuprofen,   PRECAUTIONS: None  WEIGHT BEARING RESTRICTIONS: No  FALLS:  Has patient fallen in last 6 months? No  LIVING ENVIRONMENT: Lives with: lives alone Lives in: House/apartment Stairs: Yes: Internal: 14-15 steps; can reach both and External: 4-5 steps; can reach both Has following equipment at home: None  OCCUPATION: Nutritionist  PATIENT GOALS: Decrease the "waddle", go up/down stairs   NEXT MD VISIT:   OBJECTIVE:   DIAGNOSTIC FINDINGS: x-ray performed 05/09/22 -- results pending.  PATIENT SURVEYS:  [name] 65; predicted 73  MUSCLE LENGTH: Hamstrings: Right 80 deg; Left <60 deg (likely due to reduced knee ROM) Thomas test: WFL  POSTURE:   Increased knee valgus with Q angle >20 deg on L (greater in standing than supine). Patellar malalignment also noted -- appears slightly lateral to center in supine. R iliac crest higher than L in standing  PALPATION: TTP L lateral knee and glute med. Hypomobile patellar mobility  LOWER EXTREMITY ROM:  Active ROM Right eval Left eval Left 1/31 Left 06/12/20  Left 07/11/22  Knee flexion 120 100 N/A 116 Seated 115 seated  Knee extension 0 -8 -5 -5 -3   (Blank rows = not tested)  LOWER EXTREMITY MMT:  MMT Right eval Left eval Right / Left 06/19/22 Right / Left 07/11/22  Hip flexion 4+ 4- 5- / 4 5 / 4+  Hip extension 3+ 3 4- / 3+   Hip abduction 4- 3 4 / 3+   Hip adduction      Hip internal rotation      Hip external rotation      Knee flexion 5 5    Knee extension 5 5 (at 10 deg knee flex)    Ankle dorsiflexion      Ankle plantarflexion      Ankle inversion      Ankle eversion       (Blank rows = not tested)  LOWER EXTREMITY SPECIAL TESTS:  Hip special tests: Luisa Hart (FABER) test: negative, Trendelenburg test: positive , Ober's test: positive , and Ely's test: negative  FUNCTIONAL TESTS:  SLS: 5 sec R, 1 sec L Squatting demos increased L knee valgus, R weight shift, decreased knee flexion to ~60 deg -- pt compensates into a lunge if she has to squat lower Stair descent demos poor eccentric quad strength with increased valgus  06/19/22: SLS 8 sec R, 4 sec L 07/11/22: SLS  10s R, 10s Left (after several tries) -stair descent continues with fair eccentric quad strength. (Improved)  GAIT: Distance walked: 100' Assistive device utilized: None Level of assistance: Complete Independence Comments: L foot externally rotated, knee slightly flexed during stance, antalgic, compensated trendelenburg with trunk lean during stance phase  07/11/22: Pt able to control compensated trendelenburg decreasing trunk lean with conscious effort although will divert into when tired/fatigued ~579ft.  Continued left foot ER with return to old gait pattern   [facility] Adult PT Treatment:     07/30/22  Pt seen for aquatic therapy today.  Treatment took place in water 3.25-4 ft in depth at the [facility] pool. Temp of water was 91.  Pt entered/exited the pool via stairs independently with bilat rail.  Issued laminated Aquatic HEP * gait unsupported:   forward/ backward and side stepping * Lt hamstring stretch with foot on 3rd step x 30s x 2, gentle overpressure from pt into knee ext - cues to square off hips * Rt forward step down, Lt retro step up x 15, with UE on rails * SLS with solid noodle leg press,  hip in neutral x 10 LLE slow then 10 fast *step ups leading R/L x 10 *Balance: Tandem and SLS  * tandem gait forward/backward without UE support, slow and controlled * STS on 3rd then 4th step with UE support on return to sitting x10 gaining immediate standing balance  Pt requires the buoyancy and hydrostatic pressure of water for support, and to offload joints by unweighting joint load by at least 50 % in navel deep water and  by at least 75-80% in chest to neck deep water.  Viscosity of the water is needed for resistance of strengthening. Water current perturbations provides challenge to standing balance requiring increased core activation      07/26/22 Nu step x41mins Manual: knee extension stretching; trigger point release to hamstring; grade II and III PA and AP mobilization   SLR 2x15 2lbs   Step up 6 inch 20  Lateral step up 6 inch x20  Step down 4 inch inch with control 2x10  2 inch eccentric step down 2x10   Heel/toe x20   Slow march 2x10      07/17/22 Pt seen for aquatic therapy today.  Treatment took place in water 3.25-4 ft in depth at the [facility] pool. Temp of water was 91.  Pt entered/exited the pool via stairs independently with bilat rail.  * gait unsupported:  forward/ backward and side stepping * Lt hamstring stretch with foot on 3rd step x 30s x 2, gentle overpressure from pt into knee ext - cues to square off hips * return to walking with cues for vertical trunk * SLS with hip abdct/addct x 30-40 each LE (no UE support), cues for foot alignment * Rt forward step down, Lt retro step up x 15, with UE on rails * SLS with solid noodle leg press,  hip in neutral x 10 LLE slow * back against wall  with solid noodle at ankle- straight leg pull downs x 10 to front/ x 10 in adduction each LE * forward walking kick (hip flexion to LAQ) * tandem stance with hands against legs and horiz head turns ("slowly scan room") * tandem gait forward/backward without UE support, slow and controlled * STS at bench in water with feet on blue step x 5;  STS on 4th step with UE support on return to sitting x 3 (challenge)  Pt requires the buoyancy and hydrostatic pressure of water for support, and to offload joints by unweighting joint load by at least 50 % in navel deep water and by at least 75-80% in chest to neck deep water.  Viscosity of the water is needed for resistance of strengthening. Water current perturbations provides challenge to standing balance requiring increased core activation    3/15 Manual: PA and AP glides; patella mobilization.   SLR 2x15 2lbs   Step up 6 inch 20  Lateral step up 6 inch x20  Step down 4 inch inch with control 2x10  Reviewed going down steps on a step  Leg press 2x15 50 lbs      PATIENT EDUCATION:  Education details: aquatic progressions/ modifications  Person educated: Patient Education method: Solicitor,  Education comprehension: verbalized understanding, returned demonstration, and needs further education  HOME EXERCISE PROGRAM: Access Code: [email or web address]   URL: [email or web address] Date: 05/16/2022 Prepared by: Vernon Prey April Kirstie Peri  Exercises - Seated Hamstring Stretch  - 2 x daily - 7 x weekly - 2 sets - 30 sec hold - Seated Quad Set  - 2 x daily - 7 x weekly - 2 sets - 10 reps - 3 sec hold - Supine ITB Stretch with Strap  - 1 x daily - 7 x weekly - 2 sets - 30 sec hold - Clamshell with Resistance  - 1 x daily - 7 x weekly - 2 sets - 10 reps - Supine Heel Slide with Strap  - 1 x daily - 7 x weekly - 2 sets - 10 reps - Seated Straight Leg Raise  with Support  - 1 x daily - 7 x weekly - 2 sets - 10 reps - Supine March   - 1 x daily - 7 x weekly - 2 sets - 10 reps - Supine Bridge  - 1 x daily - 7 x weekly - 2 sets - 10 reps - 3 sec hold   Added aquatics FZ 07/30/22. This aquatic home exercise program from [employer] utilizes pictures from land based exercises, but has been adapted prior to lamination and issuance.   ASSESSMENT:  CLINICAL IMPRESSION: Pt seen for her last aquatic visit.  She is issued final aquatic HEP. She completes with vc and occasional demonstration.  Program laminated.  She has reached her max potential in setting.  Pt reports low pain and an awareness of posture and proper gait throughout day. Will continue to be seen land based to progress towards meeting remaining goals.  OBJECTIVE IMPAIRMENTS: Abnormal gait, decreased balance, decreased endurance, decreased mobility, difficulty walking, decreased ROM, decreased strength, hypomobility, increased fascial restrictions, impaired flexibility, postural dysfunction, and pain.    GOALS: Goals reviewed with patient? Yes  SHORT TERM GOALS: Target date: 06/07/2022  Pt will be ind with initial HEP Baseline: Goal status: Met 06/12/22  2.  Pt will demo L knee ROM >/= 5-105 deg for improved stair ascent/descent Baseline:  Goal status: Met 06/12/22    LONG TERM GOALS: Target date: 07/05/2022   Pt will be ind with progression and advancement of HEP Baseline:  Goal status: initial  2.  Pt will demo L knee ROM of >/= 5 - 115 deg for improved stair/hill ascent/descent Baseline:  Goal status: Met 07/11/22  3.  Pt will be able to demo R & L SLS of >/=10 sec to show improved bilat LE stability Baseline: <5 sec Goal status: Partially met (not consist) 07/11/22  4.  Pt will be able to squat at least 20 lower than 70 deg of knee flexion to demo improved functional LE strength for lifting and carrying Baseline: Currently performs with compensation and pain Goal status: ongoing 07/11/22  5.  Pt will demo normal reciprocal gait pattern without  compensated trunk lean x1000' for community mobility Baseline:  Goal status: progressing 07/11/22  6.  Pt will have improved [name] score to >/=73 Baseline:  Goal status: Met 07/11/22   PLAN:  PT FREQUENCY: 1-2xweek  PT DURATION: 4 weeks  PLANNED INTERVENTIONS: Therapeutic exercises, Therapeutic activity, Neuromuscular re-education, Balance training, Gait training, Patient/Family education, Self Care, Joint mobilization, Stair training, Orthotic/Fit training, Aquatic Therapy, Dry Needling, Electrical stimulation, Cryotherapy, Moist heat, Taping, Vasopneumatic device, Ultrasound, Ionotophoresis 4mg /ml Dexamethasone, Manual therapy, and Re-evaluation  PLAN FOR NEXT SESSION: Work on knee ROM. Quad and glute stretching and strengthening. Progress to closed chain exercises as tolerated.   Corrie Dandy [place]) Dane Bloch MPT 07/30/22 4:55 PM [facility] [address] [place], [place], [address] Phone: [phone]   Fax:  [phone]  Referring diagnosis? M17.12 (ICD-10-CM) - Unilateral primary osteoarthritis, left knee Treatment diagnosis? (if different than referring diagnosis) M25.562 Chronic pain of Left Knee What was this (referring dx) caused by? []  Surgery []  Fall [x]  Ongoing issue [x]  Arthritis []  Other: ____________   Laterality: []  Rt [x]  Lt []  Both   Check all possible CPT codes:                   *CHOOSE 10 OR LESS*                                [  x] [id number] (Therapeutic Exercise)                   []  92507 (SLP Treatment)      [x]  97112 (Neuro Re-ed)                                []  92526 (Swallowing Treatment)                  [x]  97116 (Gait Training)                                []  [id number] (Cognitive Training, 1st 15 minutes) [x]  97140 (Manual Therapy)                           []  97130 (Cognitive Training, each add'l 15 minutes)         [x]  97164 (Re-evaluation)                              []  Other, List CPT Code  ____________  [x]  97530 (Therapeutic Activities)                                            [x]  97535 (Self Care)               [x]  All codes above (97110 - 97535)             []  97012 (Mechanical Traction)             [x]  97014 (E-stim Unattended)             []  97032 (E-stim manual)             [x]  97033 (Ionto)             []  97035 (Ultrasound) []  97750 (Physical Performance Training) [x]  [id number] (Aquatic Therapy) [x]  97016 (Vasopneumatic Device) []  [id number] (Paraffin) []  13086 (Contrast Bath) []  97597 (Wound Care 1st 20 sq cm) []  97598 (Wound Care each add'l 20 sq cm) []  97760 (Orthotic Fabrication, Fitting, Training Initial) []  [id number] (Prosthetic Management and Training Initial) []  [id number] (Orthotic or Prosthetic Training/ Modification Subsequent)

## 2022-07-30 ENCOUNTER — Encounter (HOSPITAL_BASED_OUTPATIENT_CLINIC_OR_DEPARTMENT_OTHER): Payer: Self-pay | Admitting: Physical Therapy

## 2022-07-30 ENCOUNTER — Ambulatory Visit (HOSPITAL_BASED_OUTPATIENT_CLINIC_OR_DEPARTMENT_OTHER): Payer: Medicare PPO | Attending: Orthopaedic Surgery | Admitting: Physical Therapy

## 2022-07-30 DIAGNOSIS — M25662 Stiffness of left knee, not elsewhere classified: Secondary | ICD-10-CM | POA: Insufficient documentation

## 2022-07-30 DIAGNOSIS — M25562 Pain in left knee: Secondary | ICD-10-CM | POA: Diagnosis not present

## 2022-07-30 DIAGNOSIS — G8929 Other chronic pain: Secondary | ICD-10-CM | POA: Diagnosis not present

## 2022-07-30 DIAGNOSIS — R2689 Other abnormalities of gait and mobility: Secondary | ICD-10-CM | POA: Diagnosis not present

## 2022-07-30 DIAGNOSIS — M6281 Muscle weakness (generalized): Secondary | ICD-10-CM | POA: Diagnosis not present

## 2022-08-02 ENCOUNTER — Ambulatory Visit (HOSPITAL_BASED_OUTPATIENT_CLINIC_OR_DEPARTMENT_OTHER): Payer: Medicare PPO | Admitting: Physical Therapy

## 2022-08-02 DIAGNOSIS — M25662 Stiffness of left knee, not elsewhere classified: Secondary | ICD-10-CM

## 2022-08-02 DIAGNOSIS — M25562 Pain in left knee: Secondary | ICD-10-CM | POA: Diagnosis not present

## 2022-08-02 DIAGNOSIS — G8929 Other chronic pain: Secondary | ICD-10-CM

## 2022-08-02 DIAGNOSIS — R2689 Other abnormalities of gait and mobility: Secondary | ICD-10-CM | POA: Diagnosis not present

## 2022-08-02 DIAGNOSIS — M6281 Muscle weakness (generalized): Secondary | ICD-10-CM

## 2022-08-02 NOTE — Therapy (Signed)
OUTPATIENT PHYSICAL THERAPY TREATMENT    Patient Name: Jaime Rose MRN: [id number] DOB:10/22/1950, 72 y.o., female Today's Date: 07/30/2022  END OF SESSION:  PT End of Session - 07/30/22 1654     Visit Number 19    Number of Visits 21    Date for PT Re-Evaluation 08/09/22    Authorization Type humana    PT Start Time 1630    PT Stop Time 1715    PT Time Calculation (min) 45 min    Activity Tolerance Patient tolerated treatment well    Behavior During Therapy WFL for tasks assessed/performed               Past Medical History:  Diagnosis Date   ALLERGIC RHINITIS    [place], HX OF 12/21/2009   Qualifier: Diagnosis of  By: Asa Lente MD, Valerie A    Hyperthyroidism    normal TFTs 11/2009 , dx 10/2011   Varicose vein    s/p sclerosis tx summer 2013   Past Surgical History:  Procedure Laterality Date   ENDOVENOUS ABLATION SAPHENOUS VEIN W/ LASER  04-11-2012   endovenous laser ablation right greater saphenous vein and stab phlebectomy  right leg  by Curt Jews MD   Patient Active Problem List   Diagnosis Date Noted   Foot pain, left 06/02/2021   Ptosis of right eyelid 04/12/2020   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 12/29/2018   Osteopenia 01/10/2017   Right carotid bruit 12/21/2016   Prediabetes 12/21/2016   Hypothyroidism 03/20/2012   Varicose veins of lower extremities with other complications [id number]   ALLERGIC RHINITIS 12/21/2009   Cardiac murmur 12/21/2009    PCP: Billey Gosling  REFERRING PROVIDER: Vanetta Mulders, MD  REFERRING DIAG: [phone] (ICD-10-CM) - Unilateral primary osteoarthritis, left knee  THERAPY DIAG:  Chronic pain of left knee  Stiffness of left knee, not elsewhere classified  Muscle weakness (generalized)  Other abnormalities of gait and mobility  Rationale for Evaluation and Treatment: Rehabilitation  ONSET DATE: Multiple years  SUBJECTIVE:   SUBJECTIVE STATEMENT: Patient reports that it is stiff in the morning. However  after a while it kind of goes away.   PERTINENT HISTORY: From PT order: Possible aquatic candidate  Hip girdle strengthening and LE strengthening for OA From eval: Pt states her knee has been a problem for years. Pt notes that knee was exacerbated in Feb when her foot was swollen (cause is unknown). Pt points to pain primarily along the lateral knee. Pt states it's not excruciating but if she exerts herself she can feel the pain (I.e. going to [place] and going up/down the stairs and hills). Pt with plans to go to [place] in April. It does get stiff over night. Pt states she is doing HEP for her foot/ankle that she was given by PT.   PAIN:  Are you having pain? yes: NPRS scale: .5/10, "just discomfort" Pain location: L anterior lateral knee joint line Pain description: Dull/achy Aggravating factors: Knee bending, descending stairs Relieving factors: Ibuprofen,   PRECAUTIONS: None  WEIGHT BEARING RESTRICTIONS: No  FALLS:  Has patient fallen in last 6 months? No  LIVING ENVIRONMENT: Lives with: lives alone Lives in: House/apartment Stairs: Yes: Internal: 14-15 steps; can reach both and External: 4-5 steps; can reach both Has following equipment at home: None  OCCUPATION: Nutritionist  PATIENT GOALS: Decrease the "waddle", go up/down stairs   NEXT MD VISIT:   OBJECTIVE:   DIAGNOSTIC FINDINGS: x-ray performed 05/09/22 -- results pending.  PATIENT SURVEYS:  [name]  65; predicted 73  MUSCLE LENGTH: Hamstrings: Right 80 deg; Left <60 deg (likely due to reduced knee ROM) Thomas test: WFL  POSTURE:   Increased knee valgus with Q angle >20 deg on L (greater in standing than supine). Patellar malalignment also noted -- appears slightly lateral to center in supine. R iliac crest higher than L in standing  PALPATION: TTP L lateral knee and glute med. Hypomobile patellar mobility  LOWER EXTREMITY ROM:  Active ROM Right eval Left eval Left 1/31 Left 06/12/20 Left 07/11/22  Knee  flexion 120 100 N/A 116 Seated 115 seated  Knee extension 0 -8 -5 -5 -3   (Blank rows = not tested)  LOWER EXTREMITY MMT:  MMT Right eval Left eval Right / Left 06/19/22 Right / Left 07/11/22  Hip flexion 4+ 4- 5- / 4 5 / 4+  Hip extension 3+ 3 4- / 3+   Hip abduction 4- 3 4 / 3+   Hip adduction      Hip internal rotation      Hip external rotation      Knee flexion 5 5    Knee extension 5 5 (at 10 deg knee flex)    Ankle dorsiflexion      Ankle plantarflexion      Ankle inversion      Ankle eversion       (Blank rows = not tested)  LOWER EXTREMITY SPECIAL TESTS:  Hip special tests: Luisa Hart (FABER) test: negative, Trendelenburg test: positive , Ober's test: positive , and Ely's test: negative  FUNCTIONAL TESTS:  SLS: 5 sec R, 1 sec L Squatting demos increased L knee valgus, R weight shift, decreased knee flexion to ~60 deg -- pt compensates into a lunge if she has to squat lower Stair descent demos poor eccentric quad strength with increased valgus  06/19/22: SLS 8 sec R, 4 sec L 07/11/22: SLS  10s R, 10s Left (after several tries) -stair descent continues with fair eccentric quad strength. (Improved)  GAIT: Distance walked: 100' Assistive device utilized: None Level of assistance: Complete Independence Comments: L foot externally rotated, knee slightly flexed during stance, antalgic, compensated trendelenburg with trunk lean during stance phase  07/11/22: Pt able to control compensated trendelenburg decreasing trunk lean with conscious effort although will divert into when tired/fatigued ~538ft.  Continued left foot ER with return to old gait pattern   [facility] Adult PT Treatment:     08/02/22  SLR 2x15 2lbs  Quad set 2x10 SAQ 2x10 1 lb LAQ 2x10 1 lb Step up 6 inch 20  Lateral step up 6 inch x20  Step down 4 inch inch with control 2x10  2 inch eccentric step down 2x10    Heel/toe x20   Slow march 2x10   Manual: knee extension stretching; trigger point release to  hamstring; grade II and III PA and AP mobilization 07/30/22  Pt seen for aquatic therapy today.  Treatment took place in water 3.25-4 ft in depth at the [facility] pool. Temp of water was 91.  Pt entered/exited the pool via stairs independently with bilat rail.  Issued laminated Aquatic HEP * gait unsupported:  forward/ backward and side stepping * Lt hamstring stretch with foot on 3rd step x 30s x 2, gentle overpressure from pt into knee ext - cues to square off hips * Rt forward step down, Lt retro step up x 15, with UE on rails * SLS with solid noodle leg press,  hip in neutral x 10 LLE slow  then 10 fast *step ups leading R/L x 10 *Balance: Tandem and SLS  * tandem gait forward/backward without UE support, slow and controlled * STS on 3rd then 4th step with UE support on return to sitting x10 gaining immediate standing balance  Pt requires the buoyancy and hydrostatic pressure of water for support, and to offload joints by unweighting joint load by at least 50 % in navel deep water and by at least 75-80% in chest to neck deep water.  Viscosity of the water is needed for resistance of strengthening. Water current perturbations provides challenge to standing balance requiring increased core activation      07/26/22 Nu step x52mins Manual: knee extension stretching; trigger point release to hamstring; grade II and III PA and AP mobilization   SLR 2x15 2lbs   Step up 6 inch 20  Lateral step up 6 inch x20  Step down 4 inch inch with control 2x10  2 inch eccentric step down 2x10   Heel/toe x20   Slow march 2x10      07/17/22 Pt seen for aquatic therapy today.  Treatment took place in water 3.25-4 ft in depth at the [facility] pool. Temp of water was 91.  Pt entered/exited the pool via stairs independently with bilat rail.  * gait unsupported:  forward/ backward and side stepping * Lt hamstring stretch with foot on 3rd step x 30s x 2, gentle overpressure from pt  into knee ext - cues to square off hips * return to walking with cues for vertical trunk * SLS with hip abdct/addct x 30-40 each LE (no UE support), cues for foot alignment * Rt forward step down, Lt retro step up x 15, with UE on rails * SLS with solid noodle leg press,  hip in neutral x 10 LLE slow * back against wall with solid noodle at ankle- straight leg pull downs x 10 to front/ x 10 in adduction each LE * forward walking kick (hip flexion to LAQ) * tandem stance with hands against legs and horiz head turns ("slowly scan room") * tandem gait forward/backward without UE support, slow and controlled * STS at bench in water with feet on blue step x 5;  STS on 4th step with UE support on return to sitting x 3 (challenge)  Pt requires the buoyancy and hydrostatic pressure of water for support, and to offload joints by unweighting joint load by at least 50 % in navel deep water and by at least 75-80% in chest to neck deep water.  Viscosity of the water is needed for resistance of strengthening. Water current perturbations provides challenge to standing balance requiring increased core activation    3/15 Manual: PA and AP glides; patella mobilization.   SLR 2x15 2lbs   Step up 6 inch 20  Lateral step up 6 inch x20  Step down 4 inch inch with control 2x10  Reviewed going down steps on a step  Leg press 2x15 50 lbs      PATIENT EDUCATION:  Education details: aquatic progressions/ modifications  Person educated: Patient Education method: Solicitor,  Education comprehension: verbalized understanding, returned demonstration, and needs further education  HOME EXERCISE PROGRAM: Access Code: [email or web address]   URL: [email or web address] Date: 05/16/2022 Prepared by: Vernon Prey April Kirstie Peri  Exercises - Seated Hamstring Stretch  - 2 x daily - 7 x weekly - 2 sets - 30 sec hold - Seated Quad Set  - 2 x daily - 7 x weekly - 2 sets -  10 reps - 3 sec hold -  Supine ITB Stretch with Strap  - 1 x daily - 7 x weekly - 2 sets - 30 sec hold - Clamshell with Resistance  - 1 x daily - 7 x weekly - 2 sets - 10 reps - Supine Heel Slide with Strap  - 1 x daily - 7 x weekly - 2 sets - 10 reps - Seated Straight Leg Raise with Support  - 1 x daily - 7 x weekly - 2 sets - 10 reps - Supine March  - 1 x daily - 7 x weekly - 2 sets - 10 reps - Supine Bridge  - 1 x daily - 7 x weekly - 2 sets - 10 reps - 3 sec hold   Added aquatics FZ 07/30/22. This aquatic home exercise program from [employer] utilizes pictures from land based exercises, but has been adapted prior to lamination and issuance.   ASSESSMENT:  CLINICAL IMPRESSION: Therapy continues to focus on stair training. She is getting more confident. We reviewed self extension stretching. Overall she is progressing towards her goals. We will continue with 1 more land visit to review final HEP> She was encouraged to continue with her stretching and strengthening program even when she discharges.    OBJECTIVE IMPAIRMENTS: Abnormal gait, decreased balance, decreased endurance, decreased mobility, difficulty walking, decreased ROM, decreased strength, hypomobility, increased fascial restrictions, impaired flexibility, postural dysfunction, and pain.    GOALS: Goals reviewed with patient? Yes  SHORT TERM GOALS: Target date: 06/07/2022  Pt will be ind with initial HEP Baseline: Goal status: Met 06/12/22  2.  Pt will demo L knee ROM >/= 5-105 deg for improved stair ascent/descent Baseline:  Goal status: Met 06/12/22    LONG TERM GOALS: Target date: 07/05/2022   Pt will be ind with progression and advancement of HEP Baseline:  Goal status: initial  2.  Pt will demo L knee ROM of >/= 5 - 115 deg for improved stair/hill ascent/descent Baseline:  Goal status: Met 07/11/22  3.  Pt will be able to demo R & L SLS of >/=10 sec to show improved bilat LE stability Baseline: <5 sec Goal status: Partially met (not  consist) 07/11/22  4.  Pt will be able to squat at least 20 lower than 70 deg of knee flexion to demo improved functional LE strength for lifting and carrying Baseline: Currently performs with compensation and pain Goal status: ongoing 07/11/22  5.  Pt will demo normal reciprocal gait pattern without compensated trunk lean x1000' for community mobility Baseline:  Goal status: progressing 07/11/22  6.  Pt will have improved [name] score to >/=73 Baseline:  Goal status: Met 07/11/22   PLAN:  PT FREQUENCY: 1-2xweek  PT DURATION: 4 weeks  PLANNED INTERVENTIONS: Therapeutic exercises, Therapeutic activity, Neuromuscular re-education, Balance training, Gait training, Patient/Family education, Self Care, Joint mobilization, Stair training, Orthotic/Fit training, Aquatic Therapy, Dry Needling, Electrical stimulation, Cryotherapy, Moist heat, Taping, Vasopneumatic device, Ultrasound, Ionotophoresis 4mg /ml Dexamethasone, Manual therapy, and Re-evaluation  PLAN FOR NEXT SESSION: Work on knee ROM. Quad and glute stretching and strengthening. Progress to closed chain exercises as tolerated.   Lorayne Bender PT DPT  08/03/22 4:55 PM [facility] [address] [place], [place], [address] Phone: [phone]   Fax:  [phone]  Referring diagnosis? M17.12 (ICD-10-CM) - Unilateral primary osteoarthritis, left knee Treatment diagnosis? (if different than referring diagnosis) M25.562 Chronic pain of Left Knee What was this (referring dx) caused by? []   Surgery []  Fall [x]  Ongoing issue [x]  Arthritis []  Other: ____________   Laterality: []  Rt [x]  Lt []  Both   Check all possible CPT codes:                   *CHOOSE 10 OR LESS*                                [x]  97110 (Therapeutic Exercise)                   []  92507 (SLP Treatment)      [x]  97112 (Neuro Re-ed)                                []  92526 (Swallowing Treatment)                  [x]   97116 (Gait Training)                                []  [id number] (Cognitive Training, 1st 15 minutes) [x]  97140 (Manual Therapy)                           []  97130 (Cognitive Training, each add'l 15 minutes)         [x]  97164 (Re-evaluation)                              []  Other, List CPT Code ____________  [x]  30051 (Therapeutic Activities)                                            [x]  97535 (Self Care)               [x]  All codes above (97110 - 97535)             []  97012 (Mechanical Traction)             [x]  97014 (E-stim Unattended)             []  97032 (E-stim manual)             [x]  97033 (Ionto)             []  97035 (Ultrasound) []  97750 (Physical Performance Training) [x]  [id number] (Aquatic Therapy) [x]  97016 (Vasopneumatic Device) []  [id number] (Paraffin) []  97034 (Contrast Bath) []  97597 (Wound Care 1st 20 sq cm) []  97598 (Wound Care each add'l 20 sq cm) []  97760 (Orthotic Fabrication, Fitting, Training Initial) []  [id number] (Prosthetic Management and Training Initial) []  [id number] (Orthotic or Prosthetic Training/ Modification Subsequent)

## 2022-08-03 ENCOUNTER — Encounter (HOSPITAL_BASED_OUTPATIENT_CLINIC_OR_DEPARTMENT_OTHER): Payer: Self-pay | Admitting: Physical Therapy

## 2022-08-07 ENCOUNTER — Ambulatory Visit (HOSPITAL_BASED_OUTPATIENT_CLINIC_OR_DEPARTMENT_OTHER): Payer: Medicare PPO | Admitting: Physical Therapy

## 2022-08-09 ENCOUNTER — Ambulatory Visit (HOSPITAL_BASED_OUTPATIENT_CLINIC_OR_DEPARTMENT_OTHER): Payer: Medicare PPO | Admitting: Physical Therapy

## 2022-08-09 DIAGNOSIS — R2689 Other abnormalities of gait and mobility: Secondary | ICD-10-CM | POA: Diagnosis not present

## 2022-08-09 DIAGNOSIS — M6281 Muscle weakness (generalized): Secondary | ICD-10-CM | POA: Diagnosis not present

## 2022-08-09 DIAGNOSIS — G8929 Other chronic pain: Secondary | ICD-10-CM | POA: Diagnosis not present

## 2022-08-09 DIAGNOSIS — M25562 Pain in left knee: Secondary | ICD-10-CM | POA: Diagnosis not present

## 2022-08-09 DIAGNOSIS — M25662 Stiffness of left knee, not elsewhere classified: Secondary | ICD-10-CM

## 2022-08-09 NOTE — Therapy (Signed)
OUTPATIENT PHYSICAL THERAPY TREATMENT/discharge     Patient Name: Jaime Rose MRN: [id number] DOB:1950-08-24, 72 y.o., female Today's Date: 08/10/2022  END OF SESSION:  PT End of Session - 08/09/22 1605     Visit Number 21    Number of Visits 21    Date for PT Re-Evaluation 08/09/22    Authorization Type humana    PT Start Time 1601    PT Stop Time 1643    PT Time Calculation (min) 42 min    Activity Tolerance Patient tolerated treatment well    Behavior During Therapy WFL for tasks assessed/performed               Past Medical History:  Diagnosis Date   ALLERGIC RHINITIS    CHICKENPOX, HX OF 12/21/2009   Qualifier: Diagnosis of  By: Felicity Coyer MD, Valerie A    Hyperthyroidism    normal TFTs 11/2009 , dx 10/2011   Varicose vein    s/p sclerosis tx summer 2013   Past Surgical History:  Procedure Laterality Date   ENDOVENOUS ABLATION SAPHENOUS VEIN W/ LASER  04-11-2012   endovenous laser ablation right greater saphenous vein and stab phlebectomy  right leg  by Gretta Began MD   Patient Active Problem List   Diagnosis Date Noted   Foot pain, left 06/02/2021   Ptosis of right eyelid 04/12/2020   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 12/29/2018   Osteopenia 01/10/2017   Right carotid bruit 12/21/2016   Prediabetes 12/21/2016   Hypothyroidism 03/20/2012   Varicose veins of lower extremities with other complications 12/21/2011   ALLERGIC RHINITIS 12/21/2009   Cardiac murmur 12/21/2009    PCP: Cheryll Cockayne  REFERRING PROVIDER: Huel Cote, MD  REFERRING DIAG: [phone] (ICD-10-CM) - Unilateral primary osteoarthritis, left knee  THERAPY DIAG:  Chronic pain of left knee  Stiffness of left knee, not elsewhere classified  Muscle weakness (generalized)  Other abnormalities of gait and mobility  Rationale for Evaluation and Treatment: Rehabilitation  ONSET DATE: Multiple years  SUBJECTIVE:   SUBJECTIVE STATEMENT: Patient reports that it is stiff in the  morning. It goes away after a while. Overall she feels like she is doing better. She is walking better and going up stairs better.    PERTINENT HISTORY: From PT order: Possible aquatic candidate  Hip girdle strengthening and LE strengthening for OA From eval: Pt states her knee has been a problem for years. Pt notes that knee was exacerbated in Feb when her foot was swollen (cause is unknown). Pt points to pain primarily along the lateral knee. Pt states it's not excruciating but if she exerts herself she can feel the pain (I.e. going to [place] and going up/down the stairs and hills). Pt with plans to go to [place] in April. It does get stiff over night. Pt states she is doing HEP for her foot/ankle that she was given by PT.   PAIN:  Are you having pain? yes: NPRS scale: .5/10, "just discomfort" Pain location: L anterior lateral knee joint line Pain description: Dull/achy Aggravating factors: Knee bending, descending stairs Relieving factors: Ibuprofen,   PRECAUTIONS: None  WEIGHT BEARING RESTRICTIONS: No  FALLS:  Has patient fallen in last 6 months? No  LIVING ENVIRONMENT: Lives with: lives alone Lives in: House/apartment Stairs: Yes: Internal: 14-15 steps; can reach both and External: 4-5 steps; can reach both Has following equipment at home: None  OCCUPATION: Nutritionist  PATIENT GOALS: Decrease the "waddle", go up/down stairs   NEXT MD VISIT:  OBJECTIVE:   DIAGNOSTIC FINDINGS: x-ray performed 05/09/22 -- results pending.  PATIENT SURVEYS:  [name] 65; predicted 73  MUSCLE LENGTH: Hamstrings: Right 80 deg; Left <60 deg (likely due to reduced knee ROM) Thomas test: WFL  POSTURE:   Increased knee valgus with Q angle >20 deg on L (greater in standing than supine). Patellar malalignment also noted -- appears slightly lateral to center in supine. R iliac crest higher than L in standing  PALPATION: TTP L lateral knee and glute med. Hypomobile patellar mobility  LOWER  EXTREMITY ROM:  Active ROM Right eval Left eval Left 1/31 Left 06/12/20 Left 07/11/22   Knee flexion 120 100 N/A 116 Seated 115 seated 120  Knee extension 0 -8 -5 -5 -3 -3   (Blank rows = not tested)  LOWER EXTREMITY MMT:  MMT Right eval Left eval Right / Left 06/19/22 Right / Left 07/11/22  Hip flexion 4+ 4- 5- / 4 5 / 4+  Hip extension 3+ 3 4- / 3+   Hip abduction 4- 3 4 / 3+   Hip adduction      Hip internal rotation      Hip external rotation      Knee flexion 5 5    Knee extension 5 5 (at 10 deg knee flex)    Ankle dorsiflexion      Ankle plantarflexion      Ankle inversion      Ankle eversion       (Blank rows = not tested)  LOWER EXTREMITY SPECIAL TESTS:  Hip special tests: Luisa Hart (FABER) test: negative, Trendelenburg test: positive , Ober's test: positive , and Ely's test: negative  FUNCTIONAL TESTS:  SLS: 5 sec R, 1 sec L Squatting demos increased L knee valgus, R weight shift, decreased knee flexion to ~60 deg -- pt compensates into a lunge if she has to squat lower Stair descent demos poor eccentric quad strength with increased valgus  06/19/22: SLS 8 sec R, 4 sec L 07/11/22: SLS  10s R, 10s Left (after several tries) -stair descent continues with fair eccentric quad strength. (Improved)  GAIT: Distance walked: 100' Assistive device utilized: None Level of assistance: Complete Independence Comments: L foot externally rotated, knee slightly flexed during stance, antalgic, compensated trendelenburg with trunk lean during stance phase  07/11/22: Pt able to control compensated trendelenburg decreasing trunk lean with conscious effort although will divert into when tired/fatigued ~558ft.  Continued left foot ER with return to old gait pattern   [facility] Adult PT Treatment:     4/12 Manual: knee extension stretching; trigger point release to hamstring; grade II and III PA and AP mobilization  Step down 4 inch inch with control 2x10  Nu-step x15 min  SLR x20    Reviewed complete HEP and updated. Reviewed goals and her overall progress.     08/02/22  SLR 2x15 2lbs  Quad set 2x10 SAQ 2x10 1 lb LAQ 2x10 1 lb Step up 6 inch 20  Lateral step up 6 inch x20  Step down 4 inch inch with control 2x10  2 inch eccentric step down 2x10    Heel/toe x20   Slow march 2x10   Manual: knee extension stretching; trigger point release to hamstring; grade II and III PA and AP mobilization 07/30/22  Pt seen for aquatic therapy today.  Treatment took place in water 3.25-4 ft in depth at the [facility] pool. Temp of water was 91.  Pt entered/exited the pool via stairs independently with bilat rail.  Issued laminated Aquatic HEP * gait unsupported:  forward/ backward and side stepping * Lt hamstring stretch with foot on 3rd step x 30s x 2, gentle overpressure from pt into knee ext - cues to square off hips * Rt forward step down, Lt retro step up x 15, with UE on rails * SLS with solid noodle leg press,  hip in neutral x 10 LLE slow then 10 fast *step ups leading R/L x 10 *Balance: Tandem and SLS  * tandem gait forward/backward without UE support, slow and controlled * STS on 3rd then 4th step with UE support on return to sitting x10 gaining immediate standing balance  Pt requires the buoyancy and hydrostatic pressure of water for support, and to offload joints by unweighting joint load by at least 50 % in navel deep water and by at least 75-80% in chest to neck deep water.  Viscosity of the water is needed for resistance of strengthening. Water current perturbations provides challenge to standing balance requiring increased core activation      07/26/22 Nu step x826mins Manual: knee extension stretching; trigger point release to hamstring; grade II and III PA and AP mobilization   SLR 2x15 2lbs   Step up 6 inch 20  Lateral step up 6 inch x20  Step down 4 inch inch with control 2x10  2 inch eccentric step down 2x10   Heel/toe x20   Slow  march 2x10      07/17/22 Pt seen for aquatic therapy today.  Treatment took place in water 3.25-4 ft in depth at [facility]. Temp of water was 91.  Pt entered/exited the pool via stairs independently with bilat rail.  * gait unsupported:  forward/ backward and side stepping * Lt hamstring stretch with foot on 3rd step x 30s x 2, gentle overpressure from pt into knee ext - cues to square off hips * return to walking with cues for vertical trunk * SLS with hip abdct/addct x 30-40 each LE (no UE support), cues for foot alignment * Rt forward step down, Lt retro step up x 15, with UE on rails * SLS with solid noodle leg press,  hip in neutral x 10 LLE slow * back against wall with solid noodle at ankle- straight leg pull downs x 10 to front/ x 10 in adduction each LE * forward walking kick (hip flexion to LAQ) * tandem stance with hands against legs and horiz head turns ("slowly scan room") * tandem gait forward/backward without UE support, slow and controlled * STS at bench in water with feet on blue step x 5;  STS on 4th step with UE support on return to sitting x 3 (challenge)  Pt requires the buoyancy and hydrostatic pressure of water for support, and to offload joints by unweighting joint load by at least 50 % in navel deep water and by at least 75-80% in chest to neck deep water.  Viscosity of the water is needed for resistance of strengthening. Water current perturbations provides challenge to standing balance requiring increased core activation    3/15 Manual: PA and AP glides; patella mobilization.   SLR 2x15 2lbs   Step up 6 inch 20  Lateral step up 6 inch x20  Step down 4 inch inch with control 2x10  Reviewed going down steps on a step  Leg press 2x15 50 lbs      PATIENT EDUCATION:  Education details: aquatic progressions/ modifications  Person educated: Patient Education method: Programmer, multimediaxplanation, Demonstration,  Education comprehension: verbalized  understanding, returned demonstration, and needs further education  HOME EXERCISE PROGRAM: Access Code: [email or web address]   URL: [email or web address] Date: 05/16/2022 Prepared by: Vernon Prey April Kirstie Peri  Exercises - Seated Hamstring Stretch  - 2 x daily - 7 x weekly - 2 sets - 30 sec hold - Seated Quad Set  - 2 x daily - 7 x weekly - 2 sets - 10 reps - 3 sec hold - Supine ITB Stretch with Strap  - 1 x daily - 7 x weekly - 2 sets - 30 sec hold - Clamshell with Resistance  - 1 x daily - 7 x weekly - 2 sets - 10 reps - Supine Heel Slide with Strap  - 1 x daily - 7 x weekly - 2 sets - 10 reps - Seated Straight Leg Raise with Support  - 1 x daily - 7 x weekly - 2 sets - 10 reps - Supine March  - 1 x daily - 7 x weekly - 2 sets - 10 reps - Supine Bridge  - 1 x daily - 7 x weekly - 2 sets - 10 reps - 3 sec hold   Added aquatics FZ 07/30/22. This aquatic home exercise program from [employer] utilizes pictures from land based exercises, but has been adapted prior to lamination and issuance.   ASSESSMENT:  CLINICAL IMPRESSION: The patient has progressed well. She still has mild motion deficits but overall she is doing well. She feels more confident with stairs but is still mildly limited with steps. She has a full program to work on. She is working on her extension at home. She feels ready to work on her home program and her aquatics program on her own. See below for goal specific progress.   OBJECTIVE IMPAIRMENTS: Abnormal gait, decreased balance, decreased endurance, decreased mobility, difficulty walking, decreased ROM, decreased strength, hypomobility, increased fascial restrictions, impaired flexibility, postural dysfunction, and pain.    GOALS: Goals reviewed with patient? Yes  SHORT TERM GOALS: Target date: 06/07/2022  Pt will be ind with initial HEP Baseline: Goal status: Met 4/12  2.  Pt will demo L knee ROM >/= 5-105 deg for improved stair ascent/descent Baseline:  Goal  status: Met achieved 4/12     LONG TERM GOALS: Target date: 07/05/2022   Pt will be ind with progression and advancement of HEP Baseline:  Goal status: initial  2.  Pt will demo L knee ROM of >/= 5 - 115 deg for improved stair/hill ascent/descent Baseline:  Goal status: Met achieved 4/12  3.  Pt will be able to demo R & L SLS of >/=10 sec to show improved bilat LE stability Baseline: <5 sec Goal status: Partially met (not consist) 07/11/22  4.  Pt will be able to squat at least 20 lower than 70 deg of knee flexion to demo improved functional LE strength for lifting and carrying Baseline: Currently performs with compensation and pain Goal status: ongoing achieved   5.  Pt will demo normal reciprocal gait pattern without compensated trunk lean x1000' for community mobility Baseline:  Goal status: progressing 07/11/22  6.  Pt will have improved [name] score to >/=73 Baseline:  Goal status: Met 4/12   PLAN:  PT FREQUENCY: 1-2xweek  PT DURATION: 4 weeks  PLANNED INTERVENTIONS: Therapeutic exercises, Therapeutic activity, Neuromuscular re-education, Balance training, Gait training, Patient/Family education, Self Care, Joint mobilization, Stair training, Orthotic/Fit training, Aquatic Therapy, Dry Needling, Electrical stimulation, Cryotherapy, Moist heat, Taping, Vasopneumatic device, Ultrasound, Ionotophoresis 4mg /ml Dexamethasone,  Manual therapy, and Re-evaluation  PLAN FOR NEXT SESSION: Patient has no more appointments, patient was discharge. Patient was informed that she can call and schedule more visits as needed.   Lorayne Bender PT DPT  08/03/22 7:59 AM [facility] [address] [place], [place], [address] Phone: [phone]   Fax:  [phone]  Tressa Busman SPT   I have reviewed and concur with this student's documentation.   During this treatment session, the therapist was present, participating in and directing the  treatment.  Referring diagnosis? M17.12 (ICD-10-CM) - Unilateral primary osteoarthritis, left knee Treatment diagnosis? (if different than referring diagnosis) M25.562 Chronic pain of Left Knee What was this (referring dx) caused by? []  Surgery []  Fall [x]  Ongoing issue [x]  Arthritis []  Other: ____________   Laterality: []  Rt [x]  Lt []  Both   Check all possible CPT codes:                   *CHOOSE 10 OR LESS*                                [x]  97110 (Therapeutic Exercise)                   []  92507 (SLP Treatment)      [x]  03524 (Neuro Re-ed)                                []  92526 (Swallowing Treatment)                  [x]  97116 (Gait Training)                                []  [id number] (Cognitive Training, 1st 15 minutes) [x]  97140 (Manual Therapy)                           []  97130 (Cognitive Training, each add'l 15 minutes)         [x]  97164 (Re-evaluation)                              []  Other, List CPT Code ____________  [x]  97530 (Therapeutic Activities)                                            [x]  97535 (Self Care)               [x]  All codes above (97110 - 97535)             []  97012 (Mechanical Traction)             [x]  97014 (E-stim Unattended)             []  97032 (E-stim manual)             [x]  81859 (Ionto)             []  97035 (Ultrasound) []  97750 (Physical Performance Training) [x]  [id number] (Aquatic Therapy) [x]  97016 (Vasopneumatic Device) []  [id number] (Paraffin) []  97034 (Contrast Bath) []  97597 (Wound Care 1st 20 sq cm) []  97598 (Wound Care  each add'l 20 sq cm) _0  97760 (Orthotic Fabrication, Fitting, Training Initial) _1  [id number] (Prosthetic Management and Training Initial) _2  [id number] (Orthotic or Prosthetic Training/ Modification Subsequent)

## 2022-08-10 ENCOUNTER — Encounter (HOSPITAL_BASED_OUTPATIENT_CLINIC_OR_DEPARTMENT_OTHER): Payer: Self-pay | Admitting: Physical Therapy

## 2022-08-14 ENCOUNTER — Ambulatory Visit (HOSPITAL_BASED_OUTPATIENT_CLINIC_OR_DEPARTMENT_OTHER): Payer: Medicare PPO | Admitting: Physical Therapy

## 2022-08-17 ENCOUNTER — Encounter (HOSPITAL_BASED_OUTPATIENT_CLINIC_OR_DEPARTMENT_OTHER): Payer: Medicare PPO | Admitting: Physical Therapy

## 2022-10-15 ENCOUNTER — Other Ambulatory Visit: Payer: Self-pay | Admitting: Internal Medicine

## 2022-10-15 DIAGNOSIS — Z0000 Encounter for general adult medical examination without abnormal findings: Secondary | ICD-10-CM

## 2022-10-17 ENCOUNTER — Ambulatory Visit: Payer: Medicare PPO

## 2022-10-19 ENCOUNTER — Ambulatory Visit
Admission: RE | Admit: 2022-10-19 | Discharge: 2022-10-19 | Disposition: A | Discharge status: Home / Self Care | Payer: Medicare PPO | Source: Ambulatory Visit | Attending: Internal Medicine | Admitting: Internal Medicine

## 2022-10-19 DIAGNOSIS — Z0000 Encounter for general adult medical examination without abnormal findings: Secondary | ICD-10-CM

## 2022-10-19 DIAGNOSIS — Z1231 Encounter for screening mammogram for malignant neoplasm of breast: Secondary | ICD-10-CM | POA: Diagnosis not present

## 2022-10-26 ENCOUNTER — Ambulatory Visit (HOSPITAL_BASED_OUTPATIENT_CLINIC_OR_DEPARTMENT_OTHER): Payer: Medicare PPO | Admitting: Orthopaedic Surgery

## 2022-10-26 ENCOUNTER — Telehealth (HOSPITAL_BASED_OUTPATIENT_CLINIC_OR_DEPARTMENT_OTHER): Payer: Self-pay | Admitting: Orthopaedic Surgery

## 2022-10-26 DIAGNOSIS — M1712 Unilateral primary osteoarthritis, left knee: Secondary | ICD-10-CM | POA: Diagnosis not present

## 2022-10-26 NOTE — Telephone Encounter (Signed)
Submitted for VOB for Monovisc-left knee  

## 2022-10-26 NOTE — Telephone Encounter (Signed)
Approved for Monovisc-Left knee Buy and Bill $40 copay Once OOP is met, pt is covered at 100% No Prior auth required

## 2022-10-26 NOTE — Telephone Encounter (Signed)
Called and scheduled

## 2022-10-26 NOTE — Telephone Encounter (Signed)
Gel injection L knee

## 2022-10-26 NOTE — Progress Notes (Signed)
Chief Complaint: Left knee pain     History of Present Illness:   10/26/2022: Today for follow-up of the left knee.  Overall this does not feel normal although she is continuing to improve with focusing on her gait with physical therapy.  This has been helping her significantly.  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  This has been chronic in nature.  This has been worsened after her trip to [place].  She is having pain going up and down stairs.  She states that she did have an injection in 2023 which caused significant side effects after steroid use.  She has been doing physical therapy in terms of a home exercise program but not a formalized program.  The pain is predominantly in the involving the lateral joint line.  She has not tried any bracing before.  She does state that she walks with a bit of a limp    Surgical History:   none  PMH/PSH/Family History/Social History/Meds/Allergies:    Past Medical History:  Diagnosis Date   ALLERGIC RHINITIS    CHICKENPOX, HX OF 12/21/2009   Qualifier: Diagnosis of  By: Felicity Coyer MD, Valerie A    Hyperthyroidism    normal TFTs 11/2009 , dx 10/2011   Varicose vein    s/p sclerosis tx summer 2013   Past Surgical History:  Procedure Laterality Date   ENDOVENOUS ABLATION SAPHENOUS VEIN W/ LASER  04-11-2012   endovenous laser ablation right greater saphenous vein and stab phlebectomy  right leg  by Gretta Began MD   Social History   Socioeconomic History   Marital status: Single    Spouse name: Not on file   Number of children: Not on file   Years of education: Not on file   Highest education level: Not on file  Occupational History   Not on file  Tobacco Use   Smoking status: Former    Types: Cigarettes    Quit date: 04/30/1988    Years since quitting: 34.5    Smokeless tobacco: Never  Substance and Sexual Activity   Alcohol use: Yes    Alcohol/week: 0.0 - 1.0 standard drinks of alcohol    Comment: 1-2 glasses a week   Drug use: No   Sexual activity: Not on file  Other Topics Concern   Not on file  Social History Narrative   Single, lives with 2 dtrs- Victory Dakin & Tresa Endo      Exercising regularly - walking dogs   Social Determinants of Health   Financial Resource Strain: Not on file  Food Insecurity: Not on file  Transportation Needs: Not on file  Physical Activity: Not on file  Stress: Not on file  Social Connections: Not on file   Family History  Problem Relation Age of Onset   Heart disease Father    Autoimmune disease Father    Hypertension Father    Other Father        varicose veins, AAA   Stroke Father    Hyperlipidemia Mother    Other Mother        varicose veins, AAA   Stroke Mother    Heart attack Mother 51   Stroke  Brother    Hyperlipidemia Brother    Hyperlipidemia Brother    Hyperlipidemia Sister    Diabetes Maternal Grandfather    Diabetes Paternal Grandfather    Breast cancer Maternal Aunt    Allergies  Allergen Reactions   Prednisone Other (See Comments)    Very agitated and near psychotic per patient-this was several years ago-not exactly sure if was prednisone or different steroid   Clobetasol Propionate Other (See Comments)    anxious   Current Outpatient Medications  Medication Sig Dispense Refill   diphenhydrAMINE HCl (BENADRYL ALLERGY PO) Take by mouth. (Patient not taking: Reported on 06/08/2022)     levothyroxine (SYNTHROID) 125 MCG tablet Take 1 tablet (125 mcg total) by mouth daily. 90 tablet 3   loratadine (CLARITIN) 10 MG tablet Take 10 mg by mouth as needed.      RESTASIS 0.05 % ophthalmic emulsion      No current facility-administered medications for this visit.   No results found.  Review of Systems:   A ROS was performed including pertinent positives and negatives as documented in the  HPI.  Physical Exam :   Constitutional: NAD and appears stated age Neurological: Alert and oriented Psych: Appropriate affect and cooperative There were no vitals taken for this visit.   Comprehensive Musculoskeletal Exam:      Musculoskeletal Exam  Gait Normal  Alignment Valgus alignment of the left   Right Left  Inspection Normal Normal  Palpation    Tenderness None Lateral joint  Crepitus None None  Effusion None None  Range of Motion    Extension 0 0  Flexion 0135 135  Strength    Extension 5/5 5/5  Flexion 5/5 5/5  Ligament Exam     Generalized Laxity No No  Lachman Negative Negative   Pivot Shift Negative Negative  Anterior Drawer Negative Negative  Valgus at 0 Negative Negative  Valgus at 20 Negative Negative  Varus at 0 0 0  Varus at 20   0 0  Posterior Drawer at 90 0 0  Vascular/Lymphatic Exam    Edema None None  Venous Stasis Changes No No  Distal Circulation Normal Normal  Neurologic    Light Touch Sensation Intact Intact  Special Tests:      Imaging:   Xray (4 views left knee): Predominantly lateral Tibiofemoral osteoarthritis with marginal osteophytes.  This is moderate in nature   I personally reviewed and interpreted the radiographs.   Assessment:   72 y.o. female with a left knee lateral compartment osteoarthritis.  I described that this is likely contributing to her valgus gait.  I did describe the treatment options including physical therapy for hip girdle and leg strengthening program.  She has continued physical therapy which is benefiting her.  At this time she would like to consider hyaluronic acid injection.  Will plan to run this through insurance authorization I will see her back following to provide this  Plan :    -Plan for the knee hyaluronic acid injection pending insurance authorization     I personally saw and evaluated the patient, and participated in the management and treatment plan.  Huel Cote, MD Attending  Physician, Orthopedic Surgery  This document was dictated using Dragon voice recognition software. A reasonable attempt at proof reading has been made to minimize errors.

## 2022-11-02 ENCOUNTER — Ambulatory Visit (HOSPITAL_BASED_OUTPATIENT_CLINIC_OR_DEPARTMENT_OTHER): Payer: Medicare PPO | Admitting: Orthopaedic Surgery

## 2022-11-02 DIAGNOSIS — M25562 Pain in left knee: Secondary | ICD-10-CM | POA: Diagnosis not present

## 2022-11-02 DIAGNOSIS — M1712 Unilateral primary osteoarthritis, left knee: Secondary | ICD-10-CM

## 2022-11-02 MED ORDER — TRIAMCINOLONE ACETONIDE 40 MG/ML IJ SUSP
80.00 mg | INTRAMUSCULAR | Status: AC | PRN
Start: 2022-11-02 — End: 2022-11-02
  Administered 2022-11-02: 80 mg via INTRA_ARTICULAR

## 2022-11-02 MED ORDER — LIDOCAINE HCL 1 % IJ SOLN
4.00 mL | INTRAMUSCULAR | Status: AC | PRN
Start: 2022-11-02 — End: 2022-11-02
  Administered 2022-11-02: 4 mL

## 2022-11-02 NOTE — Progress Notes (Signed)
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  This has been chronic in nature.  This has been worsened after her trip to [place].  She is having pain going up and down stairs.  She states that she did have an injection in 2023 which caused significant side effects after steroid use.  She has been doing physical therapy in terms of a home exercise program but not a formalized program.  The pain is predominantly in the involving the lateral joint line.  She has not tried any bracing before.  She does state that she walks with a bit of a limp    Surgical History:   none  PMH/PSH/Family History/Social History/Meds/Allergies:    Past Medical History:  Diagnosis Date  . ALLERGIC RHINITIS   . CHICKENPOX, HX OF 12/21/2009   Qualifier: Diagnosis of  By: Felicity Coyer MD, Raenette Rover Hyperthyroidism    normal TFTs 11/2009 , dx 10/2011  . Varicose vein    s/p sclerosis tx summer 2013   Past Surgical History:  Procedure Laterality Date  . ENDOVENOUS ABLATION SAPHENOUS VEIN W/ LASER  04-11-2012   endovenous laser ablation right greater saphenous vein and stab phlebectomy  right leg  by Gretta Began MD   Social History   Socioeconomic History  . Marital status: Single    Spouse name: Not on file  . Number of children: Not on file  . Years of education: Not on file  . Highest education level: Not on file  Occupational History  . Not on file  Tobacco Use  . Smoking status: Former    Types: Cigarettes    Quit date: 04/30/1988    Years since quitting: 34.5  . Smokeless tobacco: Never  Substance and Sexual Activity  . Alcohol use: Yes    Alcohol/week: 0.0 - 1.0 standard drinks of alcohol     Comment: 1-2 glasses a week  . Drug use: No  . Sexual activity: Not on file  Other Topics Concern  . Not on file  Social History Narrative   Single, lives with 2 dtrs- Victory Dakin & Tresa Endo      Exercising regularly - walking dogs   Social Determinants of Health   Financial Resource Strain: Not on [employer] Insecurity: Not on file  Transportation Needs: Not on file  Physical Activity: Not on file  Stress: Not on file  Social Connections: Not on file   Family History  Problem Relation Age of Onset  . Heart disease Father   . Autoimmune disease Father   . Hypertension Father   . Other Father        varicose veins, AAA  . Stroke Father   . Hyperlipidemia Mother   . Other Mother        varicose veins, AAA  . Stroke Mother   . Heart attack Mother 27  . Stroke Brother   . Hyperlipidemia Brother   . Hyperlipidemia Brother   . Hyperlipidemia Sister   . Diabetes Maternal Grandfather   .  Diabetes Paternal Grandfather   . Breast cancer Maternal Aunt    Allergies  Allergen Reactions  . Prednisone Other (See Comments)    Very agitated and near psychotic per patient-this was several years ago-not exactly sure if was prednisone or different steroid  . Clobetasol Propionate Other (See Comments)    anxious   Current Outpatient Medications  Medication Sig Dispense Refill  . diphenhydrAMINE HCl (BENADRYL ALLERGY PO) Take by mouth. (Patient not taking: Reported on 06/08/2022)    . levothyroxine (SYNTHROID) 125 MCG tablet Take 1 tablet (125 mcg total) by mouth daily. 90 tablet 3  . loratadine (CLARITIN) 10 MG tablet Take 10 mg by mouth as needed.     . RESTASIS 0.05 % ophthalmic emulsion      No current facility-administered medications for this visit.   No results found.  Review of Systems:   A ROS was performed including pertinent positives and negatives as documented in the HPI.  Physical Exam :   Constitutional: NAD and appears stated age Neurological: Alert and  oriented Psych: Appropriate affect and cooperative There were no vitals taken for this visit.   Comprehensive Musculoskeletal Exam:      Musculoskeletal Exam  Gait Normal  Alignment Valgus alignment of the left   Right Left  Inspection Normal Normal  Palpation    Tenderness None Lateral joint  Crepitus None None  Effusion None None  Range of Motion    Extension 0 0  Flexion 0135 135  Strength    Extension 5/5 5/5  Flexion 5/5 5/5  Ligament Exam     Generalized Laxity No No  Lachman Negative Negative   Pivot Shift Negative Negative  Anterior Drawer Negative Negative  Valgus at 0 Negative Negative  Valgus at 20 Negative Negative  Varus at 0 0 0  Varus at 20   0 0  Posterior Drawer at 90 0 0  Vascular/Lymphatic Exam    Edema None None  Venous Stasis Changes No No  Distal Circulation Normal Normal  Neurologic    Light Touch Sensation Intact Intact  Special Tests:      Imaging:   Xray (4 views left knee): Predominantly lateral Tibiofemoral osteoarthritis with marginal osteophytes.  This is moderate in nature   I personally reviewed and interpreted the radiographs.   Assessment:   72 y.o. female with a left knee lateral compartment osteoarthritis.  I described that this is likely contributing to her valgus gait.  I did describe the treatment options including physical therapy for hip girdle and leg strengthening program.  She has continued physical therapy which is benefiting her.  At this time she is also electing for hyaluronic acid injection.  This was performed and verbal consent was obtained  Plan :    -Left knee hyaluronic acid injection performed and verbal consent obtained    Procedure Note  Patient: Jaime Rose             Date of Birth: February 26, 1951           MRN: [id number]             Visit Date: 11/02/2022  Procedures: Visit Diagnoses:  1. Unilateral primary osteoarthritis, left knee     Large Joint Inj: L knee on 11/02/2022 2:19  PM Indications: pain Details: 22 G 1.5 in needle, ultrasound-guided anterior approach  Arthrogram: No  Medications: 4 mL lidocaine 1 %; 80 mg triamcinolone acetonide 40 MG/ML Outcome: tolerated well, no immediate complications Procedure, treatment alternatives, risks  and benefits explained, specific risks discussed. Consent was given by the patient. Immediately prior to procedure a time out was called to verify the correct patient, procedure, equipment, support staff and site/side marked as required. Patient was prepped and draped in the usual sterile fashion.        I personally saw and evaluated the patient, and participated in the management and treatment plan.  Huel Cote, MD Attending Physician, Orthopedic Surgery  This document was dictated using Dragon voice recognition software. A reasonable attempt at proof reading has been made to minimize errors.

## 2022-11-14 ENCOUNTER — Other Ambulatory Visit: Payer: Medicare PPO

## 2022-11-14 DIAGNOSIS — E890 Postprocedural hypothyroidism: Secondary | ICD-10-CM | POA: Diagnosis not present

## 2022-11-14 LAB — TSH: TSH: 0.1 u[IU]/mL — ABNORMAL LOW (ref 0.35–5.50)

## 2022-11-16 ENCOUNTER — Encounter: Payer: Self-pay | Admitting: Internal Medicine

## 2022-11-17 MED ORDER — LEVOTHYROXINE SODIUM 112 MCG PO TABS: 112.0000 ug | ORAL_TABLET | Freq: Every day | ORAL | 0 refills | Status: DC

## 2022-12-14 ENCOUNTER — Ambulatory Visit: Payer: Medicare PPO

## 2022-12-14 VITALS — BP 140/80 | Ht 64.0 in | Wt 166.4 lb

## 2022-12-14 DIAGNOSIS — Z0000 Encounter for general adult medical examination without abnormal findings: Secondary | ICD-10-CM

## 2022-12-14 NOTE — Progress Notes (Signed)
Subjective:   Jaime Rose is a 72 y.o. female who presents for an Initial Medicare Annual Wellness Visit.  Visit Complete: In person   Review of Systems   Cardiac Risk Factors include: advanced age (>73men, >4 women);Other (see comment), Risk factor comments: Hypothyroidism, Otseopenia,     Objective:    Today's Vitals   12/14/22 1529  Weight: 166 lb 6.4 oz (75.5 kg)  Height: 5\' 4"  (1.626 m)   Body mass index is 28.56 kg/m.     12/14/2022    3:38 PM 05/09/2022    3:17 PM 04/06/2022    6:52 PM  Advanced Directives  Does Patient Have a Medical Advance Directive? No No No  Does patient want to make changes to medical advance directive?  No - Patient declined   Would patient like information on creating a medical advance directive? Yes (MAU/Ambulatory/Procedural Areas - Information given)  No - Patient declined    Current Medications (verified) Outpatient Encounter Medications as of 12/14/2022  Medication Sig   ibuprofen (ADVIL) 200 MG tablet Take 200 mg by mouth every 6 (six) hours as needed.   levothyroxine (SYNTHROID) 112 MCG tablet Take 1 tablet (112 mcg total) by mouth daily before breakfast.   loratadine (CLARITIN) 10 MG tablet Take 10 mg by mouth as needed.    RESTASIS 0.05 % ophthalmic emulsion    diphenhydrAMINE HCl (BENADRYL ALLERGY PO) Take by mouth. (Patient not taking: Reported on 06/08/2022)   No facility-administered encounter medications on file as of 12/14/2022.    Allergies (verified) Prednisone and Clobetasol propionate   History: Past Medical History:  Diagnosis Date   ALLERGIC RHINITIS    CHICKENPOX, HX OF 12/21/2009   Qualifier: Diagnosis of  By: Felicity Coyer MD, Valerie A    Hyperthyroidism    normal TFTs 11/2009 , dx 10/2011   Varicose vein    s/p sclerosis tx summer 2013   Past Surgical History:  Procedure Laterality Date   ENDOVENOUS ABLATION SAPHENOUS VEIN W/ LASER  04/11/2012   endovenous laser ablation right greater saphenous vein and  stab phlebectomy  right leg  by Gretta Began MD   Family History  Problem Relation Age of Onset   Heart disease Father    Autoimmune disease Father    Hypertension Father    Other Father        varicose veins, AAA   Stroke Father    Hyperlipidemia Mother    Other Mother        varicose veins, AAA   Stroke Mother    Heart attack Mother 81   Stroke Brother    Hyperlipidemia Brother    Hyperlipidemia Brother    Hyperlipidemia Sister    Diabetes Maternal Grandfather    Diabetes Paternal Grandfather    Breast cancer Maternal Aunt    Social History   Socioeconomic History   Marital status: Single    Spouse name: Not on file   Number of children: Not on file   Years of education: Not on file   Highest education level: Not on file  Occupational History   Not on file  Tobacco Use   Smoking status: Former    Current packs/day: 0.00    Types: Cigarettes    Quit date: 04/30/1988    Years since quitting: 34.6   Smokeless tobacco: Never  Vaping Use   Vaping status: Never Used  Substance and Sexual Activity   Alcohol use: Yes    Alcohol/week: 0.0 - 1.0 standard drinks of  alcohol    Comment: 1-2 glasses a week   Drug use: No   Sexual activity: Not on file  Other Topics Concern   Not on file  Social History Narrative   Single, lives with 2 dtrs- Victory Dakin & Tresa Endo      Exercising regularly - walking dogs   Social Determinants of Health   Financial Resource Strain: Not on file  Food Insecurity: Not on file  Transportation Needs: Not on file  Physical Activity: Not on file  Stress: Not on file  Social Connections: Not on file    Tobacco Counseling Counseling given: Not Answered   Clinical Intake:  Pre-visit preparation completed: Yes  Pain : No/denies pain     BMI - recorded: 28.56 Nutritional Risks: None Diabetes: No  How often do you need to have someone help you when you read instructions, pamphlets, or other written materials from your doctor or pharmacy?: 1 -  Never  Interpreter Needed?: No  Information entered by :: Darroll Bredeson,RMA   Activities of Daily Living    12/14/2022    3:22 PM  In your present state of health, do you have any difficulty performing the following activities:  Hearing? 0  Vision? 0  Difficulty concentrating or making decisions? 0  Walking or climbing stairs? 0  Dressing or bathing? 0  Doing errands, shopping? 0  Preparing Food and eating ? N  Using the Toilet? N  In the past six months, have you accidently leaked urine? N  Do you have problems with loss of bowel control? N  Managing your Medications? N  Managing your Finances? N  Housekeeping or managing your Housekeeping? N    Patient Care Team: Pincus Sanes, MD as PCP - General (Internal Medicine) Jerene Bears, MD (Obstetrics and Gynecology) Romero Belling, MD (Inactive) as Attending Physician (Internal Medicine) Marcene Corning, MD (Orthopedic Surgery)  Indicate any recent Medical Services you may have received from other than [facility] providers in the past year (date may be approximate).     Assessment:   This is a routine wellness examination for Jaime Rose.  Hearing/Vision screen Hearing Screening - Comments:: Denies hearing difficulties   Vision Screening - Comments:: Denies vision issues.  Dietary issues and exercise activities discussed:     Goals Addressed               This Visit's Progress     Patient Stated (pt-stated)        Would like to drop 15 lbs, and get in better shape.      Depression Screen    05/07/2022    9:06 AM 05/07/2022    9:02 AM 04/13/2021    8:39 AM 04/12/2020    3:54 PM 12/29/2018    3:09 PM 12/21/2016    8:30 AM  PHQ 2/9 Scores  PHQ - 2 Score 0 0 0 0 0 0  PHQ- 9 Score 0 0 0       Fall Risk    12/14/2022    3:40 PM 05/07/2022    9:02 AM 04/13/2021    8:03 AM 04/12/2020    3:48 PM 12/29/2018    3:09 PM  Fall Risk   Falls in the past year? 0 0 1 0 0  Number falls in past yr: 0 0 0 0 0  Injury with  Fall? 0 0 1 0   Risk for fall due to : No Fall Risks No Fall Risks No Fall Risks No Fall Risks  Follow up Falls prevention discussed;Falls evaluation completed Falls evaluation completed Falls evaluation completed Falls evaluation completed     MEDICARE RISK AT HOME:  Medicare Risk at Home - 12/14/22 1540     Any stairs in or around the home? Yes    If so, are there any without handrails? Yes    Home free of loose throw rugs in walkways, pet beds, electrical cords, etc? No   has rugs   Adequate lighting in your home to reduce risk of falls? Yes    Life alert? No    Use of a cane, walker or w/c? No    Grab bars in the bathroom? No    Shower chair or bench in shower? No    Elevated toilet seat or a handicapped toilet? Yes             TIMED UP AND GO:  Was the test performed? Yes  Length of time to ambulate 10 feet: 10 sec Gait steady and fast without use of assistive device    Cognitive Function:        12/14/2022    3:41 PM  6CIT Screen  What time? 0 points    Immunizations Immunization History  Administered Date(s) Administered   Fluad Quad(high Dose 65+) 02/26/2022   Influenza Inj Mdck Quad Pf 02/07/2019   Influenza,inj,Quad PF,6+ Mos 05/07/2014   Influenza-Unspecified 01/27/2014, 03/30/2021   PFIZER(Purple Top)SARS-COV-2 Vaccination 06/04/2019, 06/25/2019, 03/07/2020   Pneumococcal Conjugate-13 12/25/2017   Pneumococcal Polysaccharide-23 12/29/2018   Tdap 06/07/2015   Zoster Recombinant(Shingrix) 01/28/2017, 05/21/2017    TDAP status: Up to date  Flu Vaccine status: Due, Education has been provided regarding the importance of this vaccine. Advised may receive this vaccine at local pharmacy or Health Dept. Aware to provide a copy of the vaccination record if obtained from local pharmacy or Health Dept. Verbalized acceptance and understanding.  Pneumococcal vaccine status: Up to date  Covid-19 vaccine status: Declined, Education has been provided regarding  the importance of this vaccine but patient still declined. Advised may receive this vaccine at local pharmacy or Health Dept.or vaccine clinic. Aware to provide a copy of the vaccination record if obtained from local pharmacy or Health Dept. Verbalized acceptance and understanding.  Qualifies for Shingles Vaccine? Yes   Zostavax completed Yes   Shingrix Completed?: Yes  Screening Tests Health Maintenance  Topic Date Due   COVID-19 Vaccine (4 - 2023-24 season) 12/29/2021   INFLUENZA VACCINE  11/29/2022   Fecal DNA (Cologuard)  06/28/2023   DEXA SCAN  10/11/2023   Medicare Annual Wellness (AWV)  12/14/2023   MAMMOGRAM  10/18/2024   DTaP/Tdap/Td (2 - Td or Tdap) 06/06/2025   Pneumonia Vaccine 65+ Years old  Completed   Hepatitis C Screening  Completed   Zoster Vaccines- Shingrix  Completed   HPV VACCINES  Aged Out    Health Maintenance  Health Maintenance Due  Topic Date Due   COVID-19 Vaccine (4 - 2023-24 season) 12/29/2021   INFLUENZA VACCINE  11/29/2022    Colorectal cancer screening: Type of screening: Cologuard. Completed 06/27/2020. Repeat every 3 years  Mammogram status: Completed 10/19/2022. Repeat every year  Bone Density status: Completed 10/10/2021. Results reflect: Bone density results: OSTEOPENIA. Repeat every 2 years.  Lung Cancer Screening: (Low Dose CT Chest recommended if Age 48-80 years, 20 pack-year currently smoking OR have quit w/in 15years.) does not qualify.   Lung Cancer Screening Referral: N/A  Additional Screening:  Hepatitis C Screening: does qualify; Completed 06/07/2015  Vision Screening: Recommended annual ophthalmology exams for early detection of glaucoma and other disorders of the eye. Is the patient up to date with their annual eye exam?  No  Who is the provider or what is the name of the office in which the patient attends annual eye exams? Dr. Sharlot Gowda If pt is not established with a provider, would they like to be referred to a provider to  establish care? No .   Dental Screening: Recommended annual dental exams for proper oral hygiene  Community Resource Referral / Chronic Care Management: CRR required this visit?  No   CCM required this visit?  No     Plan:     I have personally reviewed and noted the following in the patient's chart:   Medical and social history Use of alcohol, tobacco or illicit drugs  Current medications and supplements including opioid prescriptions. Patient is not currently taking opioid prescriptions. Functional ability and status Nutritional status Physical activity Advanced directives List of other physicians Hospitalizations, surgeries, and ER visits in previous 12 months Vitals Screenings to include cognitive, depression, and falls Referrals and appointments  In addition, I have reviewed and discussed with patient certain preventive protocols, quality metrics, and best practice recommendations. A written personalized care plan for preventive services as well as general preventive health recommendations were provided to patient.     Aylen Stradford L Mekisha Bittel, CMA   12/14/2022   After Visit Summary: (MyChart) Due to this being a telephonic visit, the after visit summary with patients personalized plan was offered to patient via MyChart   Nurse Notes: Patient is soon due for Flu vaccine.  She declines the Covid vaccine.  Patient is also due for an annual eye exam.  She will call to schedule her next year's AWVs .

## 2022-12-14 NOTE — Patient Instructions (Signed)
Jaime Rose , Thank you for taking time to come for your Medicare Wellness Visit. I appreciate your ongoing commitment to your health goals. Please review the following plan we discussed and let me know if I can assist you in the future.   Referrals/Orders/Follow-Ups/Clinician Recommendations: Remember that you are soon due for your flu vaccine.  It was a pleasure to meet you today and keep up the good work.  This is a list of the screening recommended for you and due dates:  Health Maintenance  Topic Date Due   COVID-19 Vaccine (4 - 2023-24 season) 12/29/2021   Flu Shot  11/29/2022   Cologuard (Stool DNA test)  06/28/2023   DEXA scan (bone density measurement)  10/11/2023   Medicare Annual Wellness Visit  12/14/2023   Mammogram  10/18/2024   DTaP/Tdap/Td vaccine (2 - Td or Tdap) 06/06/2025   Pneumonia Vaccine  Completed   Hepatitis C Screening  Completed   Zoster (Shingles) Vaccine  Completed   HPV Vaccine  Aged Out    Advanced directives: (Copy Requested) Please bring a copy of your health care power of attorney and living will to the office to be added to your chart at your convenience.  Next Medicare Annual Wellness Visit scheduled for next year: No  Preventive Care 70 Years and Older, Female Preventive care refers to lifestyle choices and visits with your health care provider that can promote health and wellness. What does preventive care include? A yearly physical exam. This is also called an annual well check. Dental exams once or twice a year. Routine eye exams. Ask your health care provider how often you should have your eyes checked. Personal lifestyle choices, including: Daily care of your teeth and gums. Regular physical activity. Eating a healthy diet. Avoiding tobacco and drug use. Limiting alcohol use. Practicing safe sex. Taking low-dose aspirin every day. Taking vitamin and mineral supplements as recommended by your health care provider. What happens during  an annual well check? The services and screenings done by your health care provider during your annual well check will depend on your age, overall health, lifestyle risk factors, and family history of disease. Counseling  Your health care provider may ask you questions about your: Alcohol use. Tobacco use. Drug use. Emotional well-being. Home and relationship well-being. Sexual activity. Eating habits. History of falls. Memory and ability to understand (cognition). Work and work Astronomer. Reproductive health. Screening  You may have the following tests or measurements: Height, weight, and BMI. Blood pressure. Lipid and cholesterol levels. These may be checked every 5 years, or more frequently if you are over 12 years old. Skin check. Lung cancer screening. You may have this screening every year starting at age 40 if you have a 30-pack-year history of smoking and currently smoke or have quit within the past 15 years. Fecal occult blood test (FOBT) of the stool. You may have this test every year starting at age 70. Flexible sigmoidoscopy or colonoscopy. You may have a sigmoidoscopy every 5 years or a colonoscopy every 10 years starting at age 21. Hepatitis C blood test. Hepatitis B blood test. Sexually transmitted disease (STD) testing. Diabetes screening. This is done by checking your blood sugar (glucose) after you have not eaten for a while (fasting). You may have this done every 1-3 years. Bone density scan. This is done to screen for osteoporosis. You may have this done starting at age 19. Mammogram. This may be done every 1-2 years. Talk to your health care provider  about how often you should have regular mammograms. Talk with your health care provider about your test results, treatment options, and if necessary, the need for more tests. Vaccines  Your health care provider may recommend certain vaccines, such as: Influenza vaccine. This is recommended every year. Tetanus,  diphtheria, and acellular pertussis (Tdap, Td) vaccine. You may need a Td booster every 10 years. Zoster vaccine. You may need this after age 75. Pneumococcal 13-valent conjugate (PCV13) vaccine. One dose is recommended after age 39. Pneumococcal polysaccharide (PPSV23) vaccine. One dose is recommended after age 82. Talk to your health care provider about which screenings and vaccines you need and how often you need them. This information is not intended to replace advice given to you by your health care provider. Make sure you discuss any questions you have with your health care provider. Document Released: 05/13/2015 Document Revised: 01/04/2016 Document Reviewed: 02/15/2015 Elsevier Interactive Patient Education  2017 [employer].  Fall Prevention in the Home Falls can cause injuries. They can happen to people of all ages. There are many things you can do to make your home safe and to help prevent falls. What can I do on the outside of my home? Regularly fix the edges of walkways and driveways and fix any cracks. Remove anything that might make you trip as you walk through a Rose, such as a raised step or threshold. Trim any bushes or trees on the path to your home. Use bright outdoor lighting. Clear any walking paths of anything that might make someone trip, such as rocks or tools. Regularly check to see if handrails are loose or broken. Make sure that both sides of any steps have handrails. Any raised decks and porches should have guardrails on the edges. Have any leaves, snow, or ice cleared regularly. Use sand or salt on walking paths during winter. Clean up any spills in your garage right away. This includes oil or grease spills. What can I do in the bathroom? Use night lights. Install grab bars by the toilet and in the tub and shower. Do not use towel bars as grab bars. Use non-skid mats or decals in the tub or shower. If you need to sit down in the shower, use a plastic,  non-slip stool. Keep the floor dry. Clean up any water that spills on the floor as soon as it happens. Remove soap buildup in the tub or shower regularly. Attach bath mats securely with double-sided non-slip rug tape. Do not have throw rugs and other things on the floor that can make you trip. What can I do in the bedroom? Use night lights. Make sure that you have a light by your bed that is easy to reach. Do not use any sheets or blankets that are too big for your bed. They should not hang down onto the floor. Have a firm chair that has side arms. You can use this for support while you get dressed. Do not have throw rugs and other things on the floor that can make you trip. What can I do in the kitchen? Clean up any spills right away. Avoid walking on wet floors. Keep items that you use a lot in easy-to-reach places. If you need to reach something above you, use a strong step stool that has a grab bar. Keep electrical cords out of the way. Do not use floor polish or wax that makes floors slippery. If you must use wax, use non-skid floor wax. Do not have throw rugs and  other things on the floor that can make you trip. What can I do with my stairs? Do not leave any items on the stairs. Make sure that there are handrails on both sides of the stairs and use them. Fix handrails that are broken or loose. Make sure that handrails are as long as the stairways. Check any carpeting to make sure that it is firmly attached to the stairs. Fix any carpet that is loose or worn. Avoid having throw rugs at the top or bottom of the stairs. If you do have throw rugs, attach them to the floor with carpet tape. Make sure that you have a light switch at the top of the stairs and the bottom of the stairs. If you do not have them, ask someone to add them for you. What else can I do to help prevent falls? Wear shoes that: Do not have high heels. Have rubber bottoms. Are comfortable and fit you well. Are closed  at the toe. Do not wear sandals. If you use a stepladder: Make sure that it is fully opened. Do not climb a closed stepladder. Make sure that both sides of the stepladder are locked into place. Ask someone to hold it for you, if possible. Clearly mark and make sure that you can see: Any grab bars or handrails. First and last steps. Where the edge of each step is. Use tools that help you move around (mobility aids) if they are needed. These include: Canes. Walkers. Scooters. Crutches. Turn on the lights when you go into a dark area. Replace any light bulbs as soon as they burn out. Set up your furniture so you have a clear path. Avoid moving your furniture around. If any of your floors are uneven, fix them. If there are any pets around you, be aware of where they are. Review your medicines with your doctor. Some medicines can make you feel dizzy. This can increase your chance of falling. Ask your doctor what other things that you can do to help prevent falls. This information is not intended to replace advice given to you by your health care provider. Make sure you discuss any questions you have with your health care provider. Document Released: 02/10/2009 Document Revised: 09/22/2015 Document Reviewed: 05/21/2014 Elsevier Interactive Patient Education  2017 [employer].

## 2023-01-22 ENCOUNTER — Ambulatory Visit (INDEPENDENT_AMBULATORY_CARE_PROVIDER_SITE_OTHER): Payer: Medicare PPO

## 2023-01-22 ENCOUNTER — Encounter: Payer: Self-pay | Admitting: Podiatry

## 2023-01-22 ENCOUNTER — Ambulatory Visit: Payer: Medicare PPO | Admitting: Podiatry

## 2023-01-22 VITALS — Ht 64.0 in | Wt 166.0 lb

## 2023-01-22 DIAGNOSIS — M2142 Flat foot [pes planus] (acquired), left foot: Secondary | ICD-10-CM

## 2023-01-22 DIAGNOSIS — M778 Other enthesopathies, not elsewhere classified: Secondary | ICD-10-CM

## 2023-01-22 DIAGNOSIS — M2141 Flat foot [pes planus] (acquired), right foot: Secondary | ICD-10-CM

## 2023-01-23 NOTE — Progress Notes (Signed)
Subjective:  Patient ID: Jaime Rose, female    DOB: 1950/10/01,  MRN: [id number] HPI Chief Complaint  Patient presents with   Foot Pain    Pronation causing knee issues. Would orthotics be helpful?     72 y.o. female presents with the above complaint.   ROS: Denies fever chills nausea vomit muscle aches pains calf pain back pain chest pain shortness of breath.  Past Medical History:  Diagnosis Date   ALLERGIC RHINITIS    CHICKENPOX, HX OF 12/21/2009   Qualifier: Diagnosis of  By: Felicity Coyer MD, Valerie A    Hyperthyroidism    normal TFTs 11/2009 , dx 10/2011   Varicose vein    s/p sclerosis tx summer 2013   Past Surgical History:  Procedure Laterality Date   ENDOVENOUS ABLATION SAPHENOUS VEIN W/ LASER  04/11/2012   endovenous laser ablation right greater saphenous vein and stab phlebectomy  right leg  by Gretta Began MD    Current Outpatient Medications:    diphenhydrAMINE HCl (BENADRYL ALLERGY PO), Take by mouth. (Patient not taking: Reported on 06/08/2022), Disp: , Rfl:    ibuprofen (ADVIL) 200 MG tablet, Take 200 mg by mouth every 6 (six) hours as needed., Disp: , Rfl:    levothyroxine (SYNTHROID) 112 MCG tablet, Take 1 tablet (112 mcg total) by mouth daily before breakfast., Disp: 90 tablet, Rfl: 0   loratadine (CLARITIN) 10 MG tablet, Take 10 mg by mouth as needed. , Disp: , Rfl:    RESTASIS 0.05 % ophthalmic emulsion, , Disp: , Rfl:   Allergies  Allergen Reactions   Prednisone Other (See Comments)    Very agitated and near psychotic per patient-this was several years ago-not exactly sure if was prednisone or different steroid   Clobetasol Propionate Other (See Comments)    anxious   Review of Systems Objective:  There were no vitals filed for this visit.  General: Well developed, nourished, in no acute distress, alert and oriented x3   Dermatological: Skin is warm, dry and supple bilateral. Nails x 10 are well maintained; remaining integument appears  unremarkable at this time. There are no open sores, no preulcerative lesions, no rash or signs of infection present.  Vascular: Dorsalis Pedis artery and Posterior Tibial artery pedal pulses are 2/4 bilateral with immedate capillary fill time. Pedal hair growth present. No varicosities and no lower extremity edema present bilateral.   Neruologic: Grossly intact via light touch bilateral. Vibratory intact via tuning fork bilateral. Protective threshold with Semmes Wienstein monofilament intact to all pedal sites bilateral. Patellar and Achilles deep tendon reflexes 2+ bilateral. No Babinski or clonus noted bilateral.   Musculoskeletal: No gross boney pedal deformities other than flexible pes planus bilateral. No pain, crepitus, or limitation noted with foot and ankle range of motion bilateral. Muscular strength 5/5 in all groups tested bilateral.  Mild genu valgum.  Gait: Unassisted, Nonantalgic.    Radiographs:  Radiographs demonstrate an osseously mature individual with decent bone mineralization.  No significant arthritides identified.  No coalitions.  She does have mild pes planus particularly on the right side versus the left.  No acute findings noted.  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  Plan: We are going to ask Jaime Rose to make orthotics for Jaime Rose with a deep heel just to neutral position.     Jaime Rose, [place]

## 2023-01-24 ENCOUNTER — Telehealth: Payer: Self-pay | Admitting: Podiatry

## 2023-01-24 DIAGNOSIS — M2141 Flat foot [pes planus] (acquired), right foot: Secondary | ICD-10-CM

## 2023-01-24 NOTE — Telephone Encounter (Signed)
PT order sent to [facility] as requested

## 2023-01-24 NOTE — Telephone Encounter (Addendum)
Pt would like the order for physical therapy to be sent. Would like it to be sent to the [facility] on [address].

## 2023-01-30 ENCOUNTER — Encounter (HOSPITAL_BASED_OUTPATIENT_CLINIC_OR_DEPARTMENT_OTHER): Payer: Medicare PPO | Admitting: Physical Therapy

## 2023-01-31 ENCOUNTER — Other Ambulatory Visit: Payer: Self-pay

## 2023-01-31 ENCOUNTER — Encounter (HOSPITAL_BASED_OUTPATIENT_CLINIC_OR_DEPARTMENT_OTHER): Payer: Self-pay | Admitting: Physical Therapy

## 2023-01-31 ENCOUNTER — Ambulatory Visit (HOSPITAL_BASED_OUTPATIENT_CLINIC_OR_DEPARTMENT_OTHER): Payer: Medicare PPO | Attending: Orthopaedic Surgery | Admitting: Physical Therapy

## 2023-01-31 DIAGNOSIS — M25662 Stiffness of left knee, not elsewhere classified: Secondary | ICD-10-CM | POA: Diagnosis not present

## 2023-01-31 DIAGNOSIS — G8929 Other chronic pain: Secondary | ICD-10-CM | POA: Diagnosis not present

## 2023-01-31 DIAGNOSIS — M2142 Flat foot [pes planus] (acquired), left foot: Secondary | ICD-10-CM | POA: Diagnosis not present

## 2023-01-31 DIAGNOSIS — M2141 Flat foot [pes planus] (acquired), right foot: Secondary | ICD-10-CM | POA: Insufficient documentation

## 2023-01-31 DIAGNOSIS — R2689 Other abnormalities of gait and mobility: Secondary | ICD-10-CM | POA: Diagnosis not present

## 2023-01-31 DIAGNOSIS — M6281 Muscle weakness (generalized): Secondary | ICD-10-CM | POA: Insufficient documentation

## 2023-01-31 NOTE — Therapy (Signed)
OUTPATIENT PHYSICAL THERAPY LOWER EXTREMITY EVALUATION   Patient Name: Jaime Rose MRN: [id number] DOB:1950-11-06, 72 y.o., female Today's Date: 01/31/2023  END OF SESSION:  PT End of Session - 01/31/23 1537     Visit Number 1    Number of Visits 8    Date for PT Re-Evaluation 03/28/23    PT Start Time 1430    PT Stop Time 1512    PT Time Calculation (min) 42 min    Activity Tolerance Patient tolerated treatment well    Behavior During Therapy [facility] for tasks assessed/performed             Past Medical History:  Diagnosis Date   ALLERGIC RHINITIS    CHICKENPOX, HX OF 12/21/2009   Qualifier: Diagnosis of  By: Felicity Coyer MD, Valerie A    Hyperthyroidism    normal TFTs 11/2009 , dx 10/2011   Varicose vein    s/p sclerosis tx summer 2013   Past Surgical History:  Procedure Laterality Date   ENDOVENOUS ABLATION SAPHENOUS VEIN W/ LASER  04/11/2012   endovenous laser ablation right greater saphenous vein and stab phlebectomy  right leg  by Gretta Began MD   Patient Active Problem List   Diagnosis Date Noted   Foot pain, left 06/02/2021   Ptosis of right eyelid 04/12/2020   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 12/29/2018   Osteopenia 01/10/2017   Right carotid bruit 12/21/2016   Prediabetes 12/21/2016   Hypothyroidism 03/20/2012   Varicose veins of bilateral lower extremities with other complications 12/21/2011   Allergic rhinitis 12/21/2009   Cardiac murmur 12/21/2009    PCP: Cheryll Cockayne, MD  REFERRING PROVIDER: Ernestene Kiel MD REFERRING DIAG:  Diagnosis  M21.41,M21.42 (ICD-10-CM) - Pes planus of both feet    THERAPY DIAG:  Chronic pain of left knee  Stiffness of left knee, not elsewhere classified  Muscle weakness (generalized)  Other abnormalities of gait and mobility  Rationale for Evaluation and Treatment: Rehabilitation  ONSET DATE: increased right IT band pain over the past 2 months  SUBJECTIVE:   SUBJECTIVE STATEMENT: The patient reports a month-long  history of IT band pain on the right side.  She feels like IT band pain may be coming from flatfoot.  She is having custom orthotics made by [facility].  She also has significant left knee OA.  She reports feeling like at times she has to shift weight off her right leg onto her left knee.  She has had PT in the past for her left knee.  She did not report any foot pain today.  She reports over the past week or so her right IT band pain is improved but is still there.  The patient also reports he feels like she is having more of an antalgic gait and a flexed trunk with ambulation.     PERTINENT HISTORY: Osteopenia, right carotid bruit, left foot pain; left knee OA PAIN:  Are you having pain? Yes: NPRS scale: 3-4/10 Pain location: Right IT band Pain description: Aching Aggravating factors: Standing and walking Relieving factors: Rest  PRECAUTIONS: None  RED FLAGS: None   WEIGHT BEARING RESTRICTIONS: No  FALLS:  Has patient fallen in last 6 months? 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 Hobbies: Travel  PLOF: Independent  PATIENT GOALS: To have less pain and IT band  NEXT MD VISIT: Nothing scheduled  OBJECTIVE:  Note:  Objective measures were completed at Evaluation unless otherwise noted.  DIAGNOSTIC FINDINGS:  Left knee x-ray:IMPRESSION: 1. Tricompartmental degenerative osteoarthritis most significant in the lateral compartment. 2. Trace suprapatellar knee joint effusion.   COGNITION: Overall cognitive status: Within functional limits for tasks assessed     SENSATION: WFL  EDEMA:  No edema noted in the left knee today   POSTURE: rounded shoulders and forward head  PALPATION: Tender to palpation along the IT band large trigger point in the right gluteal  LOWER EXTREMITY ROM:  Passive ROM Right eval Left eval  Hip flexion    Hip extension    Hip abduction     Hip adduction    Hip internal rotation    Hip external rotation    Knee flexion    Knee extension  -5   Ankle dorsiflexion    Ankle plantarflexion    Ankle inversion    Ankle eversion     (Blank rows = not tested)  LOWER EXTREMITY MMT:  MMT Right eval Left eval  Hip flexion 24 22  Hip extension    Hip abduction 32.1 22.8  Hip adduction    Hip internal rotation    Hip external rotation    Knee flexion    Knee extension 37.2 27.1  Ankle dorsiflexion    Ankle plantarflexion    Ankle inversion    Ankle eversion     (Blank rows = not tested)   GAIT: Lateral shift from left to right.  Does not walk with terminal knee extension on the left.  Flexed trunk with ambulation  TODAY'S TREATMENT:                                                                                                                              DATE:   IT band removal Manual: Trigger point release to IT band and gluteal  PATIENT EDUCATION:  Education details: HEP, symptom management, reviewed strength measurements, POC Person educated: Patient Education method: Explanation, Demonstration, Tactile cues, Verbal cues, and Handouts Education comprehension: verbalized understanding, returned demonstration, verbal cues required, tactile cues required, and needs further education  HOME EXERCISE PROGRAM: [id number]   ASSESSMENT:  CLINICAL IMPRESSION: Patient is a 72 year old female who presents with right IT band pain, bilateral flatfoot, history of left knee pain range of motion.  Her bilateral flatfoot and left knee are likely contributory to right IT band pain at this time.  She has a strength deficit between her left and right side.  She has a significant lateral shift and forward trunk bending and standing.  She will be getting orthotics soon.  She was shown self IT band and gluteal soft tissue mobilization.  She would benefit from skilled therapy to improve strength on the left side to reduce right-sided  symptoms.  She is active at baseline.  Therapy also reviewed previous HEP including ankle strengthening exercises.  OBJECTIVE IMPAIRMENTS: Abnormal gait, decreased balance, decreased endurance, decreased mobility, difficulty walking, decreased  ROM, decreased strength, hypomobility, increased fascial restrictions, impaired flexibility, postural dysfunction, and pain.    ACTIVITY LIMITATIONS: standing, squatting, stairs, and locomotion level   PARTICIPATION LIMITATIONS: community activity, yard work, and travel   PERSONAL FACTORS: Fitness, Past/current experiences, and Time since onset of injury/illness/exacerbation are also affecting patient's functional outcome.    REHAB POTENTIAL: Excellent   CLINICAL DECISION MAKING: Stable/uncomplicated   EVALUATION COMPLEXITY: Low  GOALS: Goals reviewed with patient? Yes  SHORT TERM GOALS: Target date: 03/01/2023   Patient will increase left lower extremity strength by 5 pounds Baseline: Goal status: INITIAL  2.  Patient will report a 50% reduction in IT band pain Baseline:  Goal status: INITIAL  3.  Patient will be independent with basic HEP Baseline:  Goal status: INITIAL   LONG TERM GOALS: Target date: 03/29/2023 ]\   Patient will ambulate 1 mile with new orthotics without increased pain in order to improve community ambulation Baseline:  Goal status: INITIAL  2.  Patient will go up and down 8 steps without increased pain in order to improve community ambulation Baseline:  Goal status: INITIAL  3.  Patient will have complete HEP to address left lower extremity strength Baseline:  Goal status: INITIAL     PLAN:  PT FREQUENCY: 2x/week  PT DURATION: 8 weeks  PLANNED INTERVENTIONS: Therapeutic exercises, Therapeutic activity, Neuromuscular re-education, Balance training, Gait training, Patient/Family education, Self Care, Joint mobilization, Stair training, DME instructions, Aquatic Therapy, Dry Needling, Electrical  stimulation, Cryotherapy, Moist heat, Taping, Manual therapy, and Re-evaluation.   PLAN FOR NEXT SESSION: Continue with gross bilateral lower extremity strengthening.  Review ankle exercises including inversion and plantarflexion.  Continue manual therapy of IT band and gluteal.  Consider single-leg stance exercises on the left side and right side.  When patient gets her orthotics perform gait training.  Patient feels like she is bending over more consider posterior chain exercises.  Referring diagnosis?  Diagnosis  M21.41,M21.42 (ICD-10-CM) - Pes planus of both feet   Treatment diagnosis? (if different than referring diagnosis)  Diagnosis  M21.41,M21.42 (ICD-10-CM) - Pes planus of both feet  Right leg pain   What was this (referring dx) caused by? []  Surgery []  Fall [x]  Ongoing issue []  Arthritis []  Other: ____________  Laterality: []  Rt []  Lt []  Both  Check all possible CPT codes:  *CHOOSE 10 OR LESS*    []  97110 (Therapeutic Exercise)  []  92507 (SLP Treatment)  []  97112 (Neuro Re-ed)   []  92526 (Swallowing Treatment)   []  97116 (Gait Training)   []  [id number] (Cognitive Training, 1st 15 minutes) []  97140 (Manual Therapy)   []  97130 (Cognitive Training, each add'l 15 minutes)  []  36644 (Re-evaluation)                              []  Other, List CPT Code ____________  []  97530 (Therapeutic Activities)     []  97535 (Self Care)   [x]  All codes above (97110 - 97535)  []  97012 (Mechanical Traction)  []  97014 (E-stim Unattended)  []  97032 (E-stim manual)  []  97033 (Ionto)  []  97035 (Ultrasound) []  97750 (Physical Performance Training) [x]  [id number] (Aquatic Therapy) []  97016 (Vasopneumatic Device) []  [id number] (Paraffin) []  97034 (Contrast Bath) []  97597 (Wound Care 1st 20 sq cm) []  97598 (Wound Care each add'l 20 sq cm) []  97760 (Orthotic Fabrication, Fitting, Training Initial) []  [id number] (Prosthetic Management and Training Initial) []  [id number] (Orthotic or Prosthetic Training/  Modification Subsequent)  Dessie Coma, PT 01/31/2023, 3:44 PM

## 2023-02-06 ENCOUNTER — Ambulatory Visit: Payer: Medicare PPO

## 2023-02-06 NOTE — Progress Notes (Signed)
Orthotic eval   Patient was seen, measured / scanned for custom molded foot orthotics.  Patient will benefit from CFO's as they will help provide total contact to MLA's helping to better distribute body weight across BIL feet greater reducing plantar pressure and pain and to also encourage FF and RF alignment.  Patient was scanned items to be ordered and fit when in  [employer], CFo, Cfm

## 2023-02-08 ENCOUNTER — Encounter (HOSPITAL_BASED_OUTPATIENT_CLINIC_OR_DEPARTMENT_OTHER): Payer: Self-pay | Admitting: Physical Therapy

## 2023-02-08 ENCOUNTER — Ambulatory Visit (HOSPITAL_BASED_OUTPATIENT_CLINIC_OR_DEPARTMENT_OTHER): Payer: Medicare PPO | Admitting: Physical Therapy

## 2023-02-08 DIAGNOSIS — R2689 Other abnormalities of gait and mobility: Secondary | ICD-10-CM

## 2023-02-08 DIAGNOSIS — G8929 Other chronic pain: Secondary | ICD-10-CM

## 2023-02-08 DIAGNOSIS — M2142 Flat foot [pes planus] (acquired), left foot: Secondary | ICD-10-CM | POA: Diagnosis not present

## 2023-02-08 DIAGNOSIS — M6281 Muscle weakness (generalized): Secondary | ICD-10-CM

## 2023-02-08 DIAGNOSIS — M25662 Stiffness of left knee, not elsewhere classified: Secondary | ICD-10-CM | POA: Diagnosis not present

## 2023-02-08 DIAGNOSIS — M2141 Flat foot [pes planus] (acquired), right foot: Secondary | ICD-10-CM | POA: Diagnosis not present

## 2023-02-08 NOTE — Therapy (Signed)
OUTPATIENT PHYSICAL THERAPY LOWER EXTREMITY EVALUATION   Patient Name: Jaime Rose MRN: [id number] DOB:Jan 31, 1951, 72 y.o., female Today's Date: 02/08/2023  END OF SESSION:  PT End of Session - 02/08/23 1133     Visit Number 2    Number of Visits 8    Date for PT Re-Evaluation 03/28/23    PT Start Time 1100    PT Stop Time 1143    PT Time Calculation (min) 43 min    Activity Tolerance Patient tolerated treatment well    Behavior During Therapy [facility] for tasks assessed/performed             Past Medical History:  Diagnosis Date   ALLERGIC RHINITIS    CHICKENPOX, HX OF 12/21/2009   Qualifier: Diagnosis of  By: Felicity Coyer MD, Valerie A    Hyperthyroidism    normal TFTs 11/2009 , dx 10/2011   Varicose vein    s/p sclerosis tx summer 2013   Past Surgical History:  Procedure Laterality Date   ENDOVENOUS ABLATION SAPHENOUS VEIN W/ LASER  04/11/2012   endovenous laser ablation right greater saphenous vein and stab phlebectomy  right leg  by Gretta Began MD   Patient Active Problem List   Diagnosis Date Noted   Foot pain, left 06/02/2021   Ptosis of right eyelid 04/12/2020   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 12/29/2018   Osteopenia 01/10/2017   Right carotid bruit 12/21/2016   Prediabetes 12/21/2016   Hypothyroidism 03/20/2012   Varicose veins of bilateral lower extremities with other complications 12/21/2011   Allergic rhinitis 12/21/2009   Cardiac murmur 12/21/2009    PCP: Cheryll Cockayne, MD  REFERRING PROVIDER: Ernestene Kiel MD REFERRING DIAG:  Diagnosis  M21.41,M21.42 (ICD-10-CM) - Pes planus of both feet    THERAPY DIAG:  Chronic pain of left knee  Stiffness of left knee, not elsewhere classified  Muscle weakness (generalized)  Other abnormalities of gait and mobility  Rationale for Evaluation and Treatment: Rehabilitation  ONSET DATE: increased right IT band pain over the past 2 months  SUBJECTIVE:   SUBJECTIVE STATEMENT: The patient felt good for about 4  days. She reports she did some yard work and the pain came back. She is having increased pain standing and walking on the right side. She is not having pain on the left knee. She has been to an orthotist who noticed a leg length discrepancy.    Eval:  The patient reports a month-long history of IT band pain on the right side.  She feels like IT band pain may be coming from flatfoot.  She is having custom orthotics made by [employer].  She also has significant left knee OA.  She reports feeling like at times she has to shift weight off her right leg onto her left knee.  She has had PT in the past for her left knee.  She did not report any foot pain today.  She reports over the past week or so her right IT band pain is improved but is still there.  The patient also reports he feels like she is having more of an antalgic gait and a flexed trunk with ambulation.     PERTINENT HISTORY: Osteopenia, right carotid bruit, left foot pain; left knee OA PAIN:  Are you having pain? Yes: NPRS scale: 3-4/10 Pain location: Right IT band Pain description: Aching Aggravating factors: Standing and walking Relieving factors: Rest  PRECAUTIONS: None  RED FLAGS: None   WEIGHT BEARING RESTRICTIONS: No  FALLS:  Has patient fallen in last 6 months? 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 Hobbies: Travel  PLOF: Independent  PATIENT GOALS: To have less pain and IT band  NEXT MD VISIT: Nothing scheduled  OBJECTIVE:  Note: Objective measures were completed at Evaluation unless otherwise noted.  DIAGNOSTIC FINDINGS:  Left knee x-ray:IMPRESSION: 1. Tricompartmental degenerative osteoarthritis most significant in the lateral compartment. 2. Trace suprapatellar knee joint effusion.   COGNITION: Overall cognitive status: Within functional limits for tasks assessed     SENSATION: WFL  EDEMA:  No edema  noted in the left knee today   POSTURE: rounded shoulders and forward head  PALPATION: Tender to palpation along the IT band large trigger point in the right gluteal  LOWER EXTREMITY ROM:  Passive ROM Right eval Left eval  Hip flexion    Hip extension    Hip abduction    Hip adduction    Hip internal rotation    Hip external rotation    Knee flexion    Knee extension  -5   Ankle dorsiflexion    Ankle plantarflexion    Ankle inversion    Ankle eversion     (Blank rows = not tested)  LOWER EXTREMITY MMT:  MMT Right eval Left eval  Hip flexion 24 22  Hip extension    Hip abduction 32.1 22.8  Hip adduction    Hip internal rotation    Hip external rotation    Knee flexion    Knee extension 37.2 27.1  Ankle dorsiflexion    Ankle plantarflexion    Ankle inversion    Ankle eversion     (Blank rows = not tested)   GAIT: Lateral shift from left to right.  Does not walk with terminal knee extension on the left.  Flexed trunk with ambulation  TODAY'S TREATMENT:                                                                                                                              DATE:   10/11 Manual: trigger point release to  IT band and right gluteal; review of self sift tissue mobilization.   Gait: put heel lift in shoe and assessed tolerance   Bridge with green band 3x12  Hip abduction green 3x15  SLR 2x20 left     Eval:  IT band removal Manual: Trigger point release to IT band and gluteal  PATIENT EDUCATION:  Education details: HEP, symptom management, reviewed strength measurements, POC Person educated: Patient Education method: Explanation, Demonstration, Tactile cues, Verbal cues, and Handouts Education comprehension: verbalized understanding, returned demonstration, verbal cues required, tactile cues required, and needs further education  HOME EXERCISE PROGRAM: [id number]   ASSESSMENT:  CLINICAL IMPRESSION: Therapy gave patient info on  TPDN. She will look over the info. She may benefit from it. She continues to have trigger points.  They became more tolerant to pressure with manual therapy.  We also put a heel wedge in. 2 level seemed to be an overcorrection. 1 level seemed to improve her lateral movement. We reviewed basic exercises to work on at home. We will continue to progress stability exercises as tolerated.   Eval: Patient is a 72 year old female who presents with right IT band pain, bilateral flatfoot, history of left knee pain range of motion.  Her bilateral flatfoot and left knee are likely contributory to right IT band pain at this time.  She has a strength deficit between her left and right side.  She has a significant lateral shift and forward trunk bending and standing.  She will be getting orthotics soon.  She was shown self IT band and gluteal soft tissue mobilization.  She would benefit from skilled therapy to improve strength on the left side to reduce right-sided symptoms.  She is active at baseline.  Therapy also reviewed previous HEP including ankle strengthening exercises.  OBJECTIVE IMPAIRMENTS: Abnormal gait, decreased balance, decreased endurance, decreased mobility, difficulty walking, decreased ROM, decreased strength, hypomobility, increased fascial restrictions, impaired flexibility, postural dysfunction, and pain.    ACTIVITY LIMITATIONS: standing, squatting, stairs, and locomotion level   PARTICIPATION LIMITATIONS: community activity, yard work, and travel   PERSONAL FACTORS: Fitness, Past/current experiences, and Time since onset of injury/illness/exacerbation are also affecting patient's functional outcome.    REHAB POTENTIAL: Excellent   CLINICAL DECISION MAKING: Stable/uncomplicated   EVALUATION COMPLEXITY: Low  GOALS: Goals reviewed with patient? Yes  SHORT TERM GOALS: Target date: 03/01/2023   Patient will increase left lower extremity strength by 5 pounds Baseline: Goal status:  INITIAL  2.  Patient will report a 50% reduction in IT band pain Baseline:  Goal status: INITIAL  3.  Patient will be independent with basic HEP Baseline:  Goal status: INITIAL   LONG TERM GOALS: Target date: 03/29/2023 ]\   Patient will ambulate 1 mile with new orthotics without increased pain in order to improve community ambulation Baseline:  Goal status: INITIAL  2.  Patient will go up and down 8 steps without increased pain in order to improve community ambulation Baseline:  Goal status: INITIAL  3.  Patient will have complete HEP to address left lower extremity strength Baseline:  Goal status: INITIAL     PLAN:  PT FREQUENCY: 2x/week  PT DURATION: 8 weeks  PLANNED INTERVENTIONS: Therapeutic exercises, Therapeutic activity, Neuromuscular re-education, Balance training, Gait training, Patient/Family education, Self Care, Joint mobilization, Stair training, DME instructions, Aquatic Therapy, Dry Needling, Electrical stimulation, Cryotherapy, Moist heat, Taping, Manual therapy, and Re-evaluation.   PLAN FOR NEXT SESSION: Continue with gross bilateral lower extremity strengthening.  Review ankle exercises including inversion and plantarflexion.  Continue manual therapy of IT band and gluteal.  Consider single-leg stance exercises on the left side and right side.  When patient gets her orthotics perform gait training.  Patient feels like she is bending over more consider posterior chain exercises.  Referring diagnosis?  Diagnosis  M21.41,M21.42 (ICD-10-CM) - Pes planus of both feet   Treatment diagnosis? (if different than referring diagnosis)  Diagnosis  M21.41,M21.42 (ICD-10-CM) - Pes planus of both feet  Right leg pain   What was this (referring dx) caused by? []  Surgery []  Fall [x]  Ongoing issue []  Arthritis []  Other: ____________  Laterality: []  Rt []  Lt []  Both  Check all possible CPT codes:  *CHOOSE 10 OR LESS*    []  97110 (Therapeutic  Exercise)  []  92507 (  SLP Treatment)  []  [phone] (Neuro Re-ed)   []  92526 (Swallowing Treatment)   []  [phone] (Gait Training)   []  [phone] (Cognitive Training, 1st 15 minutes) []  97140 (Manual Therapy)   []  97130 (Cognitive Training, each add'l 15 minutes)  []  97164 (Re-evaluation)                              []  Other, List CPT Code ____________  []  97530 (Therapeutic Activities)     []  97535 (Self Care)   [x]  All codes above (97110 - 97535)  []  32440 (Mechanical Traction)  []  97014 (E-stim Unattended)  []  97032 (E-stim manual)  []  97033 (Ionto)  []  97035 (Ultrasound) []  97750 (Physical Performance Training) [x]  [id number] (Aquatic Therapy) []  97016 (Vasopneumatic Device) []  [id number] (Paraffin) []  97034 (Contrast Bath) []  97597 (Wound Care 1st 20 sq cm) []  97598 (Wound Care each add'l 20 sq cm) []  97760 (Orthotic Fabrication, Fitting, Training Initial) []  [id number] (Prosthetic Management and Training Initial) []  [id number] (Orthotic or Prosthetic Training/ Modification Subsequent)   Dessie Coma, PT 02/08/2023, 11:38 AM

## 2023-02-15 ENCOUNTER — Ambulatory Visit (HOSPITAL_BASED_OUTPATIENT_CLINIC_OR_DEPARTMENT_OTHER): Payer: Medicare PPO | Admitting: Physical Therapy

## 2023-02-15 DIAGNOSIS — R2689 Other abnormalities of gait and mobility: Secondary | ICD-10-CM | POA: Diagnosis not present

## 2023-02-15 DIAGNOSIS — G8929 Other chronic pain: Secondary | ICD-10-CM

## 2023-02-15 DIAGNOSIS — M2142 Flat foot [pes planus] (acquired), left foot: Secondary | ICD-10-CM | POA: Diagnosis not present

## 2023-02-15 DIAGNOSIS — M25662 Stiffness of left knee, not elsewhere classified: Secondary | ICD-10-CM

## 2023-02-15 DIAGNOSIS — M6281 Muscle weakness (generalized): Secondary | ICD-10-CM | POA: Diagnosis not present

## 2023-02-15 DIAGNOSIS — M2141 Flat foot [pes planus] (acquired), right foot: Secondary | ICD-10-CM | POA: Diagnosis not present

## 2023-02-17 ENCOUNTER — Encounter (HOSPITAL_BASED_OUTPATIENT_CLINIC_OR_DEPARTMENT_OTHER): Payer: Self-pay | Admitting: Physical Therapy

## 2023-02-17 NOTE — Therapy (Signed)
OUTPATIENT PHYSICAL THERAPY LOWER EXTREMITY EVALUATION   Patient Name: Jaime Rose MRN: [id number] DOB:[birth year], 72 y.o., female Today's Date: 02/17/2023  END OF SESSION:  PT End of Session - 02/17/23 2046     Visit Number 3    Number of Visits 8    Date for PT Re-Evaluation 03/28/23    PT Start Time 1515    PT Stop Time 1557    PT Time Calculation (min) 42 min    Activity Tolerance Patient tolerated treatment well    Behavior During Therapy [facility] for tasks assessed/performed             Past Medical History:  Diagnosis Date   ALLERGIC RHINITIS    CHICKENPOX, HX OF 12/21/2009   Qualifier: Diagnosis of  By: Felicity Coyer MD, Valerie A    Hyperthyroidism    normal TFTs 11/2009 , dx 10/2011   Varicose vein    s/p sclerosis tx summer 2013   Past Surgical History:  Procedure Laterality Date   ENDOVENOUS ABLATION SAPHENOUS VEIN W/ LASER  04/11/2012   endovenous laser ablation right greater saphenous vein and stab phlebectomy  right leg  by Gretta Began MD   Patient Active Problem List   Diagnosis Date Noted   Foot pain, left 06/02/2021   Ptosis of right eyelid 04/12/2020   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 12/29/2018   Osteopenia 01/10/2017   Right carotid bruit 12/21/2016   Prediabetes 12/21/2016   Hypothyroidism 03/20/2012   Varicose veins of bilateral lower extremities with other complications 12/21/2011   Allergic rhinitis 12/21/2009   Cardiac murmur 12/21/2009    PCP: Cheryll Cockayne, MD  REFERRING PROVIDER: Ernestene Kiel MD REFERRING DIAG:  Diagnosis  M21.41,M21.42 (ICD-10-CM) - Pes planus of both feet    THERAPY DIAG:  Chronic pain of left knee  Stiffness of left knee, not elsewhere classified  Muscle weakness (generalized)  Other abnormalities of gait and mobility  Rationale for Evaluation and Treatment: Rehabilitation  ONSET DATE: increased right IT band pain over the past 2 months  SUBJECTIVE:   SUBJECTIVE STATEMENT: Patient continues to have some  improvement in pain following physical therapy session but does not have continuous carryover.  She has significant pain over the past few days.  She is having difficulty lying on that side at night.  The pain is no more focused in her glutes recently.   Eval:  The patient reports a month-long history of IT band pain on the right side.  She feels like IT band pain may be coming from flatfoot.  She is having custom orthotics made by [employer].  She also has significant left knee OA.  She reports feeling like at times she has to shift weight off her right leg onto her left knee.  She has had PT in the past for her left knee.  She did not report any foot pain today.  She reports over the past week or so her right IT band pain is improved but is still there.  The patient also reports he feels like she is having more of an antalgic gait and a flexed trunk with ambulation.     PERTINENT HISTORY: Osteopenia, right carotid bruit, left foot pain; left knee OA PAIN:  Are you having pain? Yes: NPRS scale: 6/10 Pain location: Right IT band Pain description: Aching Aggravating factors: Standing and walking Relieving factors: Rest  PRECAUTIONS: None  RED FLAGS: None   WEIGHT BEARING RESTRICTIONS: No  FALLS:  Has patient fallen in  last 6 months? 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 Hobbies: Travel  PLOF: Independent  PATIENT GOALS: To have less pain and IT band  NEXT MD VISIT: Nothing scheduled  OBJECTIVE:  Note: Objective measures were completed at Evaluation unless otherwise noted.  DIAGNOSTIC FINDINGS:  Left knee x-ray:IMPRESSION: 1. Tricompartmental degenerative osteoarthritis most significant in the lateral compartment. 2. Trace suprapatellar knee joint effusion.   COGNITION: Overall cognitive status: Within functional limits for tasks assessed     SENSATION: WFL  EDEMA:   No edema noted in the left knee today   POSTURE: rounded shoulders and forward head  PALPATION: Tender to palpation along the IT band large trigger point in the right gluteal  LOWER EXTREMITY ROM:  Passive ROM Right eval Left eval 10/20  Hip flexion     Hip extension     Hip abduction     Hip adduction     Hip internal rotation     Hip external rotation     Knee flexion     Knee extension  -5  -5  Ankle dorsiflexion     Ankle plantarflexion     Ankle inversion     Ankle eversion      (Blank rows = not tested)  LOWER EXTREMITY MMT:  MMT Right eval Left eval  Hip flexion 24 22  Hip extension    Hip abduction 32.1 22.8  Hip adduction    Hip internal rotation    Hip external rotation    Knee flexion    Knee extension 37.2 27.1  Ankle dorsiflexion    Ankle plantarflexion    Ankle inversion    Ankle eversion     (Blank rows = not tested)   GAIT: Lateral shift from left to right.  Does not walk with terminal knee extension on the left.  Flexed trunk with ambulation  TODAY'S TREATMENT:                                                                                                                              DATE:   10/20  Trigger Point Dry-Needling  Treatment instructions: Expect mild to moderate muscle soreness. S/S of pneumothorax if dry needled over a lung field, and to seek immediate medical attention should they occur. Patient verbalized understanding of these instructions and education.  Patient Consent Given: Yes Education handout provided: Yes Muscles treated: Right gluteal using a 0.30 X50 needle 2 times right IT band 2 times using a 0.30 X50 needle Electrical stimulation performed: No Parameters: N/A Treatment response/outcome: Great twitch  SLR 2 x 10 each leg Bridge with band 2 x 20 Bilateral hip abduction green 2 x 20  10/11 Manual: trigger point release to  IT band and right gluteal; review of self sift tissue mobilization.   Gait: put  heel lift in shoe and assessed  tolerance   Bridge with green band 3x12  Hip abduction green 3x15  SLR 2x20 left     Eval:  IT band removal Manual: Trigger point release to IT band and gluteal  PATIENT EDUCATION:  Education details: HEP, symptom management, reviewed strength measurements, POC Person educated: Patient Education method: Explanation, Demonstration, Tactile cues, Verbal cues, and Handouts Education comprehension: verbalized understanding, returned demonstration, verbal cues required, tactile cues required, and needs further education  HOME EXERCISE PROGRAM: [id number]   ASSESSMENT:  CLINICAL IMPRESSION: Therapy performed trial of trigger point dry needling the patient's gluteal and IT band.  She had a great twitch response.  She felt like she walks better following the treatment.  We reviewed exercises to follow at home to reduce post needle soreness.  Will continue to work on opposite knee extension.  We also continue to work on left single-leg stability as tolerated. Eval: Patient is a 72 year old female who presents with right IT band pain, bilateral flatfoot, history of left knee pain range of motion.  Her bilateral flatfoot and left knee are likely contributory to right IT band pain at this time.  She has a strength deficit between her left and right side.  She has a significant lateral shift and forward trunk bending and standing.  She will be getting orthotics soon.  She was shown self IT band and gluteal soft tissue mobilization.  She would benefit from skilled therapy to improve strength on the left side to reduce right-sided symptoms.  She is active at baseline.  Therapy also reviewed previous HEP including ankle strengthening exercises.  OBJECTIVE IMPAIRMENTS: Abnormal gait, decreased balance, decreased endurance, decreased mobility, difficulty walking, decreased ROM, decreased strength, hypomobility, increased fascial restrictions, impaired flexibility, postural  dysfunction, and pain.    ACTIVITY LIMITATIONS: standing, squatting, stairs, and locomotion level   PARTICIPATION LIMITATIONS: community activity, yard work, and travel   PERSONAL FACTORS: Fitness, Past/current experiences, and Time since onset of injury/illness/exacerbation are also affecting patient's functional outcome.    REHAB POTENTIAL: Excellent   CLINICAL DECISION MAKING: Stable/uncomplicated   EVALUATION COMPLEXITY: Low  GOALS: Goals reviewed with patient? Yes  SHORT TERM GOALS: Target date: 03/01/2023   Patient will increase left lower extremity strength by 5 pounds Baseline: Goal status: INITIAL  2.  Patient will report a 50% reduction in IT band pain Baseline:  Goal status: INITIAL  3.  Patient will be independent with basic HEP Baseline:  Goal status: INITIAL   LONG TERM GOALS: Target date: 03/29/2023 ]\   Patient will ambulate 1 mile with new orthotics without increased pain in order to improve community ambulation Baseline:  Goal status: INITIAL  2.  Patient will go up and down 8 steps without increased pain in order to improve community ambulation Baseline:  Goal status: INITIAL  3.  Patient will have complete HEP to address left lower extremity strength Baseline:  Goal status: INITIAL     PLAN:  PT FREQUENCY: 2x/week  PT DURATION: 8 weeks  PLANNED INTERVENTIONS: Therapeutic exercises, Therapeutic activity, Neuromuscular re-education, Balance training, Gait training, Patient/Family education, Self Care, Joint mobilization, Stair training, DME instructions, Aquatic Therapy, Dry Needling, Electrical stimulation, Cryotherapy, Moist heat, Taping, Manual therapy, and Re-evaluation.   PLAN FOR NEXT SESSION: Continue with gross bilateral lower extremity strengthening.  Review ankle exercises including inversion and plantarflexion.  Continue manual therapy of IT band and gluteal.  Consider single-leg stance exercises on the left side and right side.   When patient gets her orthotics perform gait training.  Patient feels like she is bending over more consider posterior chain exercises.  Referring diagnosis?  Diagnosis  M21.41,M21.42 (ICD-10-CM) - Pes planus of both feet   Treatment diagnosis? (if different than referring diagnosis)  Diagnosis  M21.41,M21.42 (ICD-10-CM) - Pes planus of both feet  Right leg pain   What was this (referring dx) caused by? []  Surgery []  Fall [x]  Ongoing issue []  Arthritis []  Other: ____________  Laterality: []  Rt []  Lt []  Both  Check all possible CPT codes:  *CHOOSE 10 OR LESS*    []  97110 (Therapeutic Exercise)  []  92507 (SLP Treatment)  []  97112 (Neuro Re-ed)   []  92526 (Swallowing Treatment)   []  97116 (Gait Training)   []  [id number] (Cognitive Training, 1st 15 minutes) []  97140 (Manual Therapy)   []  97130 (Cognitive Training, each add'l 15 minutes)  []  97164 (Re-evaluation)                              []  Other, List CPT Code ____________  []  97530 (Therapeutic Activities)     []  97535 (Self Care)   [x]  All codes above (97110 - 97535)  []  97012 (Mechanical Traction)  []  97014 (E-stim Unattended)  []  97032 (E-stim manual)  []  97033 (Ionto)  []  97035 (Ultrasound) []  97750 (Physical Performance Training) [x]  16109 (Aquatic Therapy) []  97016 (Vasopneumatic Device) []  [id number] (Paraffin) []  97034 (Contrast Bath) []  97597 (Wound Care 1st 20 sq cm) []  97598 (Wound Care each add'l 20 sq cm) []  97760 (Orthotic Fabrication, Fitting, Training Initial) []  [id number] (Prosthetic Management and Training Initial) []  [id number] (Orthotic or Prosthetic Training/ Modification Subsequent)   Dessie Coma, PT 02/17/2023, 10:37 PM

## 2023-02-18 ENCOUNTER — Encounter (HOSPITAL_BASED_OUTPATIENT_CLINIC_OR_DEPARTMENT_OTHER): Payer: Self-pay | Admitting: Physical Therapy

## 2023-02-18 ENCOUNTER — Ambulatory Visit (HOSPITAL_BASED_OUTPATIENT_CLINIC_OR_DEPARTMENT_OTHER): Payer: Medicare PPO | Admitting: Physical Therapy

## 2023-02-18 DIAGNOSIS — M25662 Stiffness of left knee, not elsewhere classified: Secondary | ICD-10-CM | POA: Diagnosis not present

## 2023-02-18 DIAGNOSIS — G8929 Other chronic pain: Secondary | ICD-10-CM | POA: Diagnosis not present

## 2023-02-18 DIAGNOSIS — M6281 Muscle weakness (generalized): Secondary | ICD-10-CM | POA: Diagnosis not present

## 2023-02-18 DIAGNOSIS — R2689 Other abnormalities of gait and mobility: Secondary | ICD-10-CM | POA: Diagnosis not present

## 2023-02-18 DIAGNOSIS — M2142 Flat foot [pes planus] (acquired), left foot: Secondary | ICD-10-CM | POA: Diagnosis not present

## 2023-02-18 DIAGNOSIS — M2141 Flat foot [pes planus] (acquired), right foot: Secondary | ICD-10-CM | POA: Diagnosis not present

## 2023-02-18 NOTE — Therapy (Signed)
OUTPATIENT PHYSICAL THERAPY LOWER EXTREMITY EVALUATION   Patient Name: Jaime Rose MRN: [id number] DOB:Dec 01, 1950, 72 y.o., female Today's Date: 02/18/2023  END OF SESSION:  PT End of Session - 02/18/23 1016     Visit Number 4    Number of Visits 8    Date for PT Re-Evaluation 03/28/23    PT Start Time 1015    PT Stop Time 1058    PT Time Calculation (min) 43 min    Activity Tolerance Patient tolerated treatment well    Behavior During Therapy WFL for tasks assessed/performed             Past Medical History:  Diagnosis Date   ALLERGIC RHINITIS    CHICKENPOX, HX OF 12/21/2009   Qualifier: Diagnosis of  By: Felicity Coyer MD, Valerie A    Hyperthyroidism    normal TFTs 11/2009 , dx 10/2011   Varicose vein    s/p sclerosis tx summer 2013   Past Surgical History:  Procedure Laterality Date   ENDOVENOUS ABLATION SAPHENOUS VEIN W/ LASER  04/11/2012   endovenous laser ablation right greater saphenous vein and stab phlebectomy  right leg  by Gretta Began MD   Patient Active Problem List   Diagnosis Date Noted   Foot pain, left 06/02/2021   Ptosis of right eyelid 04/12/2020   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 12/29/2018   Osteopenia 01/10/2017   Right carotid bruit 12/21/2016   Prediabetes 12/21/2016   Hypothyroidism 03/20/2012   Varicose veins of bilateral lower extremities with other complications 12/21/2011   Allergic rhinitis 12/21/2009   Cardiac murmur 12/21/2009    PCP: Cheryll Cockayne, MD  REFERRING PROVIDER: Ernestene Kiel MD REFERRING DIAG:  Diagnosis  M21.41,M21.42 (ICD-10-CM) - Pes planus of both feet    THERAPY DIAG:  Chronic pain of left knee  Stiffness of left knee, not elsewhere classified  Muscle weakness (generalized)  Other abnormalities of gait and mobility  Rationale for Evaluation and Treatment: Rehabilitation  ONSET DATE: increased right IT band pain over the past 2 months  SUBJECTIVE:   SUBJECTIVE STATEMENT: The patient had a good response  to trigger point dry needling. She is having very little pain today and over the past few days.   Eval:  The patient reports a month-long history of IT band pain on the right side.  She feels like IT band pain may be coming from flatfoot.  She is having custom orthotics made by [facility].  She also has significant left knee OA.  She reports feeling like at times she has to shift weight off her right leg onto her left knee.  She has had PT in the past for her left knee.  She did not report any foot pain today.  She reports over the past week or so her right IT band pain is improved but is still there.  The patient also reports he feels like she is having more of an antalgic gait and a flexed trunk with ambulation.     PERTINENT HISTORY: Osteopenia, right carotid bruit, left foot pain; left knee OA PAIN:  Are you having pain? Yes: NPRS scale: 6/10 Pain location: Right IT band Pain description: Aching Aggravating factors: Standing and walking Relieving factors: Rest  PRECAUTIONS: None  RED FLAGS: None   WEIGHT BEARING RESTRICTIONS: No  FALLS:  Has patient fallen in last 6 months? 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 Hobbies: Travel  PLOF: Independent  PATIENT GOALS: To have less pain and IT band  NEXT MD VISIT: Nothing scheduled  OBJECTIVE:  Note: Objective measures were completed at Evaluation unless otherwise noted.  DIAGNOSTIC FINDINGS:  Left knee x-ray:IMPRESSION: 1. Tricompartmental degenerative osteoarthritis most significant in the lateral compartment. 2. Trace suprapatellar knee joint effusion.   COGNITION: Overall cognitive status: Within functional limits for tasks assessed     SENSATION: WFL  EDEMA:  No edema noted in the left knee today   POSTURE: rounded shoulders and forward head  PALPATION: Tender to palpation along the IT band large trigger  point in the right gluteal  LOWER EXTREMITY ROM:  Passive ROM Right eval Left eval 10/20  Hip flexion     Hip extension     Hip abduction     Hip adduction     Hip internal rotation     Hip external rotation     Knee flexion     Knee extension  -5  -5  Ankle dorsiflexion     Ankle plantarflexion     Ankle inversion     Ankle eversion      (Blank rows = not tested)  LOWER EXTREMITY MMT:  MMT Right eval Left eval  Hip flexion 24 22  Hip extension    Hip abduction 32.1 22.8  Hip adduction    Hip internal rotation    Hip external rotation    Knee flexion    Knee extension 37.2 27.1  Ankle dorsiflexion    Ankle plantarflexion    Ankle inversion    Ankle eversion     (Blank rows = not tested)   GAIT: Lateral shift from left to right.  Does not walk with terminal knee extension on the left.  Flexed trunk with ambulation  TODAY'S TREATMENT:                                                                                                                              DATE:   10/22 Manual: trigger point release to  IT band and right gluteal; review of self sift tissue mobilization.   SLR 2 x 10 each leg Bridge with band 2 x 20 Bilateral hip abduction green 2 x 20  Leg press 3x10 15 lbs        10/20  Trigger Point Dry-Needling  Treatment instructions: Expect mild to moderate muscle soreness. S/S of pneumothorax if dry needled over a lung field, and to seek immediate medical attention should they occur. Patient verbalized understanding of these instructions and education.  Patient Consent Given: Yes Education handout provided: Yes Muscles treated: Right gluteal using a 0.30 X50 needle 2 times right IT band 2 times using a 0.30 X50 needle Electrical stimulation performed: No Parameters: N/A Treatment response/outcome: Great twitch  SLR 2 x 10 each leg Bridge with band 2 x 20 Bilateral hip abduction green 2 x 20  10/11 Manual: trigger  point release to  IT  band and right gluteal; review of self sift tissue mobilization.   Gait: put heel lift in shoe and assessed tolerance   Bridge with green band 3x12  Hip abduction green 3x15  SLR 2x20 left     Eval:  IT band removal Manual: Trigger point release to IT band and gluteal  PATIENT EDUCATION:  Education details: HEP, symptom management, reviewed strength measurements, POC Person educated: Patient Education method: Explanation, Demonstration, Tactile cues, Verbal cues, and Handouts Education comprehension: verbalized understanding, returned demonstration, verbal cues required, tactile cues required, and needs further education  HOME EXERCISE PROGRAM: [id number]   ASSESSMENT:  CLINICAL IMPRESSION: The patient had an improvement in trigger points in her gluteal and IT band today. She did not require needling. We will perform again PRN. We also focused on left knee extension today as this may be contributory to her pain on the right side. We reviewed her home exercises and added in a standing march. We also put her on the leg press. She tolerated the leg pres well.    Eval: Patient is a 73 year old female who presents with right IT band pain, bilateral flatfoot, history of left knee pain range of motion.  Her bilateral flatfoot and left knee are likely contributory to right IT band pain at this time.  She has a strength deficit between her left and right side.  She has a significant lateral shift and forward trunk bending and standing.  She will be getting orthotics soon.  She was shown self IT band and gluteal soft tissue mobilization.  She would benefit from skilled therapy to improve strength on the left side to reduce right-sided symptoms.  She is active at baseline.  Therapy also reviewed previous HEP including ankle strengthening exercises.  OBJECTIVE IMPAIRMENTS: Abnormal gait, decreased balance, decreased endurance, decreased mobility, difficulty walking, decreased ROM, decreased  strength, hypomobility, increased fascial restrictions, impaired flexibility, postural dysfunction, and pain.    ACTIVITY LIMITATIONS: standing, squatting, stairs, and locomotion level   PARTICIPATION LIMITATIONS: community activity, yard work, and travel   PERSONAL FACTORS: Fitness, Past/current experiences, and Time since onset of injury/illness/exacerbation are also affecting patient's functional outcome.    REHAB POTENTIAL: Excellent   CLINICAL DECISION MAKING: Stable/uncomplicated   EVALUATION COMPLEXITY: Low  GOALS: Goals reviewed with patient? Yes  SHORT TERM GOALS: Target date: 03/01/2023   Patient will increase left lower extremity strength by 5 pounds Baseline: Goal status: INITIAL  2.  Patient will report a 50% reduction in IT band pain Baseline:  Goal status: INITIAL  3.  Patient will be independent with basic HEP Baseline:  Goal status: INITIAL   LONG TERM GOALS: Target date: 03/29/2023 ]\   Patient will ambulate 1 mile with new orthotics without increased pain in order to improve community ambulation Baseline:  Goal status: INITIAL  2.  Patient will go up and down 8 steps without increased pain in order to improve community ambulation Baseline:  Goal status: INITIAL  3.  Patient will have complete HEP to address left lower extremity strength Baseline:  Goal status: INITIAL     PLAN:  PT FREQUENCY: 2x/week  PT DURATION: 8 weeks  PLANNED INTERVENTIONS: Therapeutic exercises, Therapeutic activity, Neuromuscular re-education, Balance training, Gait training, Patient/Family education, Self Care, Joint mobilization, Stair training, DME instructions, Aquatic Therapy, Dry Needling, Electrical stimulation, Cryotherapy, Moist heat, Taping, Manual therapy, and Re-evaluation.   PLAN FOR NEXT SESSION: Continue with gross bilateral lower extremity strengthening.  Review ankle exercises including  inversion and plantarflexion.  Continue manual therapy of IT  band and gluteal.  Consider single-leg stance exercises on the left side and right side.  When patient gets her orthotics perform gait training.  Patient feels like she is bending over more consider posterior chain exercises.  Referring diagnosis?  Diagnosis  M21.41,M21.42 (ICD-10-CM) - Pes planus of both feet   Treatment diagnosis? (if different than referring diagnosis)  Diagnosis  M21.41,M21.42 (ICD-10-CM) - Pes planus of both feet  Right leg pain   What was this (referring dx) caused by? []  Surgery []  Fall [x]  Ongoing issue []  Arthritis []  Other: ____________  Laterality: []  Rt []  Lt []  Both  Check all possible CPT codes:  *CHOOSE 10 OR LESS*    []  97110 (Therapeutic Exercise)  []  92507 (SLP Treatment)  []  16109 (Neuro Re-ed)   []  92526 (Swallowing Treatment)   []  97116 (Gait Training)   []  [id number] (Cognitive Training, 1st 15 minutes) []  97140 (Manual Therapy)   []  97130 (Cognitive Training, each add'l 15 minutes)  []  97164 (Re-evaluation)                              []  Other, List CPT Code ____________  []  97530 (Therapeutic Activities)     []  97535 (Self Care)   [x]  All codes above (97110 - 97535)  []  97012 (Mechanical Traction)  []  97014 (E-stim Unattended)  []  97032 (E-stim manual)  []  97033 (Ionto)  []  97035 (Ultrasound) []  97750 (Physical Performance Training) [x]  [id number] (Aquatic Therapy) []  97016 (Vasopneumatic Device) []  [id number] (Paraffin) []  97034 (Contrast Bath) []  97597 (Wound Care 1st 20 sq cm) []  97598 (Wound Care each add'l 20 sq cm) []  97760 (Orthotic Fabrication, Fitting, Training Initial) []  [id number] (Prosthetic Management and Training Initial) []  [id number] (Orthotic or Prosthetic Training/ Modification Subsequent)   Dessie Coma, PT 02/18/2023, 10:56 AM

## 2023-02-20 ENCOUNTER — Other Ambulatory Visit (HOSPITAL_BASED_OUTPATIENT_CLINIC_OR_DEPARTMENT_OTHER): Payer: Self-pay

## 2023-02-21 ENCOUNTER — Encounter (HOSPITAL_BASED_OUTPATIENT_CLINIC_OR_DEPARTMENT_OTHER): Payer: Medicare PPO | Admitting: Physical Therapy

## 2023-02-21 DIAGNOSIS — H02421 Myogenic ptosis of right eyelid: Secondary | ICD-10-CM | POA: Diagnosis not present

## 2023-02-21 DIAGNOSIS — H16223 Keratoconjunctivitis sicca, not specified as Sjogren's, bilateral: Secondary | ICD-10-CM | POA: Diagnosis not present

## 2023-02-21 DIAGNOSIS — H52223 Regular astigmatism, bilateral: Secondary | ICD-10-CM | POA: Diagnosis not present

## 2023-02-21 DIAGNOSIS — H40022 Open angle with borderline findings, high risk, left eye: Secondary | ICD-10-CM | POA: Diagnosis not present

## 2023-02-21 DIAGNOSIS — H524 Presbyopia: Secondary | ICD-10-CM | POA: Diagnosis not present

## 2023-02-25 ENCOUNTER — Other Ambulatory Visit (HOSPITAL_BASED_OUTPATIENT_CLINIC_OR_DEPARTMENT_OTHER): Payer: Self-pay

## 2023-02-25 MED ORDER — INFLUENZA VAC A&B SURF ANT ADJ 0.5 ML IM SUSY
0.5000 mL | PREFILLED_SYRINGE | Freq: Once | INTRAMUSCULAR | 0 refills | Status: AC
  Filled 2023-02-25: qty 0.5, 1d supply, fill #0

## 2023-03-08 ENCOUNTER — Encounter (HOSPITAL_BASED_OUTPATIENT_CLINIC_OR_DEPARTMENT_OTHER): Payer: Self-pay | Admitting: Physical Therapy

## 2023-03-08 ENCOUNTER — Ambulatory Visit (HOSPITAL_BASED_OUTPATIENT_CLINIC_OR_DEPARTMENT_OTHER): Payer: Medicare PPO | Attending: Orthopaedic Surgery | Admitting: Physical Therapy

## 2023-03-08 DIAGNOSIS — R2689 Other abnormalities of gait and mobility: Secondary | ICD-10-CM | POA: Insufficient documentation

## 2023-03-08 DIAGNOSIS — M25562 Pain in left knee: Secondary | ICD-10-CM | POA: Diagnosis not present

## 2023-03-08 DIAGNOSIS — G8929 Other chronic pain: Secondary | ICD-10-CM | POA: Insufficient documentation

## 2023-03-08 DIAGNOSIS — M6281 Muscle weakness (generalized): Secondary | ICD-10-CM | POA: Insufficient documentation

## 2023-03-08 DIAGNOSIS — M25662 Stiffness of left knee, not elsewhere classified: Secondary | ICD-10-CM | POA: Insufficient documentation

## 2023-03-08 NOTE — Therapy (Unsigned)
OUTPATIENT PHYSICAL THERAPY LOWER EXTREMITY EVALUATION   Patient Name: Jaime Rose MRN: [id number] DOB:1950-05-27, 72 y.o., female Today's Date: 03/08/2023  END OF SESSION:  PT End of Session - 03/08/23 1537     Visit Number 5    Number of Visits 8    Date for PT Re-Evaluation 03/28/23    PT Start Time 1145    PT Stop Time 1227    PT Time Calculation (min) 42 min    Activity Tolerance Patient tolerated treatment well    Behavior During Therapy [facility] for tasks assessed/performed              Past Medical History:  Diagnosis Date   ALLERGIC RHINITIS    CHICKENPOX, HX OF 12/21/2009   Qualifier: Diagnosis of  By: Felicity Coyer MD, Valerie A    Hyperthyroidism    normal TFTs 11/2009 , dx 10/2011   Varicose vein    s/p sclerosis tx summer 2013   Past Surgical History:  Procedure Laterality Date   ENDOVENOUS ABLATION SAPHENOUS VEIN W/ LASER  04/11/2012   endovenous laser ablation right greater saphenous vein and stab phlebectomy  right leg  by Gretta Began MD   Patient Active Problem List   Diagnosis Date Noted   Foot pain, left 06/02/2021   Ptosis of right eyelid 04/12/2020   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 12/29/2018   Osteopenia 01/10/2017   Right carotid bruit 12/21/2016   Prediabetes 12/21/2016   Hypothyroidism 03/20/2012   Varicose veins of bilateral lower extremities with other complications 12/21/2011   Allergic rhinitis 12/21/2009   Cardiac murmur 12/21/2009    PCP: Cheryll Cockayne, MD  REFERRING PROVIDER: Ernestene Kiel MD REFERRING DIAG:  Diagnosis  M21.41,M21.42 (ICD-10-CM) - Pes planus of both feet    THERAPY DIAG:  Chronic pain of left knee  Stiffness of left knee, not elsewhere classified  Muscle weakness (generalized)  Other abnormalities of gait and mobility  Rationale for Evaluation and Treatment: Rehabilitation  ONSET DATE: increased right IT band pain over the past 2 months  SUBJECTIVE:   SUBJECTIVE STATEMENT: The patient reports she has done  pretty well over the past week.  She has switched to Aleve which has decreased her pain significantly. She feels like most of her problem is back in her left knee.    Eval:  The patient reports a month-long history of IT band pain on the right side.  She feels like IT band pain may be coming from flatfoot.  She is having custom orthotics made by [employer].  She also has significant left knee OA.  She reports feeling like at times she has to shift weight off her right leg onto her left knee.  She has had PT in the past for her left knee.  She did not report any foot pain today.  She reports over the past week or so her right IT band pain is improved but is still there.  The patient also reports he feels like she is having more of an antalgic gait and a flexed trunk with ambulation.     PERTINENT HISTORY: Osteopenia, right carotid bruit, left foot pain; left knee OA PAIN:  Are you having pain? Yes: NPRS scale: 1/10 Pain location: left knee  Pain description: Aching Aggravating factors: Standing and walking Relieving factors: Rest  PRECAUTIONS: None  RED FLAGS: None   WEIGHT BEARING RESTRICTIONS: No  FALLS:  Has patient fallen in last 6 months? 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 Hobbies: Travel  PLOF: Independent  PATIENT GOALS: To have less pain and IT band  NEXT MD VISIT: Nothing scheduled  OBJECTIVE:  Note: Objective measures were completed at Evaluation unless otherwise noted.  DIAGNOSTIC FINDINGS:  Left knee x-ray:IMPRESSION: 1. Tricompartmental degenerative osteoarthritis most significant in the lateral compartment. 2. Trace suprapatellar knee joint effusion.   COGNITION: Overall cognitive status: Within functional limits for tasks assessed     SENSATION: WFL  EDEMA:  No edema noted in the left knee today   POSTURE: rounded shoulders and forward  head  PALPATION: Tender to palpation along the IT band large trigger point in the right gluteal  LOWER EXTREMITY ROM:  Passive ROM Right eval Left eval 10/20  Hip flexion     Hip extension     Hip abduction     Hip adduction     Hip internal rotation     Hip external rotation     Knee flexion     Knee extension  -5  -5  Ankle dorsiflexion     Ankle plantarflexion     Ankle inversion     Ankle eversion      (Blank rows = not tested)  LOWER EXTREMITY MMT:  MMT Right eval Left eval Right  11/8 Left 11/8   Hip flexion 24 22 29.8 29.5  Hip extension      Hip abduction 32.1 22.8 30.5 27.0  Hip adduction      Hip internal rotation      Hip external rotation      Knee flexion      Knee extension 37.2 27.1 32.4 32.5  Ankle dorsiflexion      Ankle plantarflexion      Ankle inversion      Ankle eversion       (Blank rows = not tested)   GAIT: Lateral shift from left to right.  Does not walk with terminal knee extension on the left.  Flexed trunk with ambulation  TODAY'S TREATMENT:                                                                                                                              DATE:   11/8 Manual: trigger point release to  IT band and right gluteal; review of self sift tissue mobilization.   SLR 3 x 12 each leg reviewed RPE and added 1.5 lbs weight for last 2 sets RPE of 6  Bridge with band 3x15 green  Bilateral hip abduction green 3x15 green RPE 71f 3   Heel raise 2x15  Standing slow march 2x15     10/22 Manual: trigger point release to  IT band and right gluteal; review of self sift tissue mobilization.   SLR 2 x 10 each leg Bridge with band 2 x 20 Bilateral hip abduction green 2 x 20  Leg press  3x10 15 lbs         10/20  Trigger Point Dry-Needling  Treatment instructions: Expect mild to moderate muscle soreness. S/S of pneumothorax if dry needled over a lung field, and to seek immediate medical attention should they  occur. Patient verbalized understanding of these instructions and education.  Patient Consent Given: Yes Education handout provided: Yes Muscles treated: Right gluteal using a 0.30 X50 needle 2 times right IT band 2 times using a 0.30 X50 needle Electrical stimulation performed: No Parameters: N/A Treatment response/outcome: Great twitch  SLR 2 x 10 each leg Bridge with band 2 x 20 Bilateral hip abduction green 2 x 20  10/11 Manual: trigger point release to  IT band and right gluteal; review of self sift tissue mobilization.   Gait: put heel lift in shoe and assessed tolerance   Bridge with green band 3x12  Hip abduction green 3x15  SLR 2x20 left     Eval:  IT band removal Manual: Trigger point release to IT band and gluteal  PATIENT EDUCATION:  Education details: HEP, symptom management, reviewed strength measurements, POC Person educated: Patient Education method: Explanation, Demonstration, Tactile cues, Verbal cues, and Handouts Education comprehension: verbalized understanding, returned demonstration, verbal cues required, tactile cues required, and needs further education  HOME EXERCISE PROGRAM: [id number]   ASSESSMENT:  CLINICAL IMPRESSION: The patient had an improvement in trigger points in her gluteal and IT band today. She did not require needling. We will perform again PRN. We also focused on left knee extension today as this may be contributory to her pain on the right side. We reviewed her home exercises and added in a standing march. We also put her on the leg press. She tolerated the leg pres well.    Eval: Patient is a 72 year old female who presents with right IT band pain, bilateral flatfoot, history of left knee pain range of motion.  Her bilateral flatfoot and left knee are likely contributory to right IT band pain at this time.  She has a strength deficit between her left and right side.  She has a significant lateral shift and forward trunk bending and  standing.  She will be getting orthotics soon.  She was shown self IT band and gluteal soft tissue mobilization.  She would benefit from skilled therapy to improve strength on the left side to reduce right-sided symptoms.  She is active at baseline.  Therapy also reviewed previous HEP including ankle strengthening exercises.  OBJECTIVE IMPAIRMENTS: Abnormal gait, decreased balance, decreased endurance, decreased mobility, difficulty walking, decreased ROM, decreased strength, hypomobility, increased fascial restrictions, impaired flexibility, postural dysfunction, and pain.    ACTIVITY LIMITATIONS: standing, squatting, stairs, and locomotion level   PARTICIPATION LIMITATIONS: community activity, yard work, and travel   PERSONAL FACTORS: Fitness, Past/current experiences, and Time since onset of injury/illness/exacerbation are also affecting patient's functional outcome.    REHAB POTENTIAL: Excellent   CLINICAL DECISION MAKING: Stable/uncomplicated   EVALUATION COMPLEXITY: Low  GOALS: Goals reviewed with patient? Yes  SHORT TERM GOALS: Target date: 03/01/2023   Patient will increase left lower extremity strength by 5 pounds Baseline: Goal status: INITIAL  2.  Patient will report a 50% reduction in IT band pain Baseline:  Goal status: INITIAL  3.  Patient will be independent with basic HEP Baseline:  Goal status: INITIAL   LONG TERM GOALS: Target date: 03/29/2023 ]\   Patient will ambulate 1 mile with new orthotics without increased pain in order to improve community ambulation Baseline:  Goal status: INITIAL  2.  Patient will go up and down 8 steps without increased pain in order to improve community ambulation Baseline:  Goal status: INITIAL  3.  Patient will have complete HEP to address left lower extremity strength Baseline:  Goal status: INITIAL     PLAN:  PT FREQUENCY: 2x/week  PT DURATION: 8 weeks  PLANNED INTERVENTIONS: Therapeutic exercises,  Therapeutic activity, Neuromuscular re-education, Balance training, Gait training, Patient/Family education, Self Care, Joint mobilization, Stair training, DME instructions, Aquatic Therapy, Dry Needling, Electrical stimulation, Cryotherapy, Moist heat, Taping, Manual therapy, and Re-evaluation.   PLAN FOR NEXT SESSION: Continue with gross bilateral lower extremity strengthening.  Review ankle exercises including inversion and plantarflexion.  Continue manual therapy of IT band and gluteal.  Consider single-leg stance exercises on the left side and right side.  When patient gets her orthotics perform gait training.  Patient feels like she is bending over more consider posterior chain exercises.  Referring diagnosis?  Diagnosis  M21.41,M21.42 (ICD-10-CM) - Pes planus of both feet   Treatment diagnosis? (if different than referring diagnosis)  Diagnosis  M21.41,M21.42 (ICD-10-CM) - Pes planus of both feet  Right leg pain   What was this (referring dx) caused by? []  Surgery []  Fall [x]  Ongoing issue []  Arthritis []  Other: ____________  Laterality: []  Rt []  Lt []  Both  Check all possible CPT codes:  *CHOOSE 10 OR LESS*    []  97110 (Therapeutic Exercise)  []  92507 (SLP Treatment)  []  97112 (Neuro Re-ed)   []  92526 (Swallowing Treatment)   []  97116 (Gait Training)   []  [id number] (Cognitive Training, 1st 15 minutes) []  97140 (Manual Therapy)   []  97130 (Cognitive Training, each add'l 15 minutes)  []  97164 (Re-evaluation)                              []  Other, List CPT Code ____________  []  97530 (Therapeutic Activities)     []  97535 (Self Care)   [x]  All codes above (97110 - 97535)  []  97012 (Mechanical Traction)  []  97014 (E-stim Unattended)  []  97032 (E-stim manual)  []  97033 (Ionto)  []  97035 (Ultrasound) []  97750 (Physical Performance Training) [x]  [id number] (Aquatic Therapy) []  97016 (Vasopneumatic Device) []  [id number] (Paraffin) []  97034 (Contrast Bath) []  97597 (Wound Care 1st 20 sq  cm) []  34742 (Wound Care each add'l 20 sq cm) []  97760 (Orthotic Fabrication, Fitting, Training Initial) []  [id number] (Prosthetic Management and Training Initial) []  [id number] (Orthotic or Prosthetic Training/ Modification Subsequent)   Dessie Coma, PT 03/08/2023, 3:39 PM

## 2023-04-03 ENCOUNTER — Telehealth: Payer: Self-pay | Admitting: Orthopaedic Surgery

## 2023-04-03 NOTE — Telephone Encounter (Signed)
Pt requesting Gel injection  monovisc FOR LEFT KNEE PLEASE ADVISE LAST INJECTION WAS 11/02/22

## 2023-04-03 NOTE — Telephone Encounter (Signed)
Next available gel injection would need to be after 05/05/2023. Submission for gel injections for 2025 will start on 05/06/2023 due to insurance changes.

## 2023-04-04 ENCOUNTER — Encounter (HOSPITAL_BASED_OUTPATIENT_CLINIC_OR_DEPARTMENT_OTHER): Payer: Self-pay

## 2023-04-04 NOTE — Telephone Encounter (Signed)
Yes, I will. Thank you

## 2023-04-08 ENCOUNTER — Other Ambulatory Visit: Payer: Medicare PPO

## 2023-04-09 ENCOUNTER — Ambulatory Visit (INDEPENDENT_AMBULATORY_CARE_PROVIDER_SITE_OTHER): Payer: Medicare PPO

## 2023-04-09 DIAGNOSIS — M2141 Flat foot [pes planus] (acquired), right foot: Secondary | ICD-10-CM | POA: Diagnosis not present

## 2023-04-09 DIAGNOSIS — M2142 Flat foot [pes planus] (acquired), left foot: Secondary | ICD-10-CM

## 2023-04-09 DIAGNOSIS — M778 Other enthesopathies, not elsewhere classified: Secondary | ICD-10-CM

## 2023-04-09 NOTE — Progress Notes (Signed)
Patient presents today to pick up custom molded foot orthotics, diagnosed with Capsulitis by Dr. Al Corpus.   Orthotics were dispensed and fit was satisfactory. Reviewed instructions for break-in and wear. Written instructions given to patient.  Patient will follow up as needed.   Addison Bailey Cped, CFO, CFm

## 2023-04-15 ENCOUNTER — Other Ambulatory Visit: Payer: Self-pay | Admitting: Urgent Care

## 2023-04-16 MED ORDER — LEVOTHYROXINE SODIUM 112 MCG PO TABS: 112.0000 ug | ORAL_TABLET | Freq: Every day | ORAL | 0 refills | Status: DC

## 2023-04-26 ENCOUNTER — Other Ambulatory Visit: Payer: Self-pay

## 2023-04-26 DIAGNOSIS — M7750 Other enthesopathy of unspecified foot: Secondary | ICD-10-CM

## 2023-04-26 DIAGNOSIS — M2142 Flat foot [pes planus] (acquired), left foot: Secondary | ICD-10-CM

## 2023-04-26 DIAGNOSIS — M778 Other enthesopathies, not elsewhere classified: Secondary | ICD-10-CM

## 2023-04-30 ENCOUNTER — Ambulatory Visit (HOSPITAL_BASED_OUTPATIENT_CLINIC_OR_DEPARTMENT_OTHER): Payer: Medicare PPO | Attending: Podiatry | Admitting: Physical Therapy

## 2023-04-30 ENCOUNTER — Encounter (HOSPITAL_BASED_OUTPATIENT_CLINIC_OR_DEPARTMENT_OTHER): Payer: Self-pay | Admitting: Physical Therapy

## 2023-04-30 DIAGNOSIS — G8929 Other chronic pain: Secondary | ICD-10-CM | POA: Diagnosis not present

## 2023-04-30 DIAGNOSIS — M778 Other enthesopathies, not elsewhere classified: Secondary | ICD-10-CM | POA: Insufficient documentation

## 2023-04-30 DIAGNOSIS — M2142 Flat foot [pes planus] (acquired), left foot: Secondary | ICD-10-CM | POA: Insufficient documentation

## 2023-04-30 DIAGNOSIS — M25662 Stiffness of left knee, not elsewhere classified: Secondary | ICD-10-CM | POA: Diagnosis not present

## 2023-04-30 DIAGNOSIS — M2141 Flat foot [pes planus] (acquired), right foot: Secondary | ICD-10-CM | POA: Insufficient documentation

## 2023-04-30 DIAGNOSIS — M25551 Pain in right hip: Secondary | ICD-10-CM

## 2023-04-30 DIAGNOSIS — R2689 Other abnormalities of gait and mobility: Secondary | ICD-10-CM | POA: Diagnosis not present

## 2023-04-30 DIAGNOSIS — M25562 Pain in left knee: Secondary | ICD-10-CM | POA: Diagnosis not present

## 2023-04-30 DIAGNOSIS — M7750 Other enthesopathy of unspecified foot: Secondary | ICD-10-CM | POA: Diagnosis present

## 2023-04-30 DIAGNOSIS — M6281 Muscle weakness (generalized): Secondary | ICD-10-CM

## 2023-04-30 NOTE — Therapy (Signed)
 OUTPATIENT PHYSICAL THERAPY LOWER EXTREMITY EVALUATION   Patient Name: Jaime Rose MRN: [id number] DOB:1950-08-29, 72 y.o., female Today's Date: 04/30/2023  END OF SESSION:  PT End of Session - 04/30/23 1322     Visit Number 6    Number of Visits 14    Date for PT Re-Evaluation 06/25/23    PT Start Time 1300    PT Stop Time 1343    PT Time Calculation (min) 43 min    Activity Tolerance Patient tolerated treatment well    Behavior During Therapy [facility] for tasks assessed/performed               Past Medical History:  Diagnosis Date   ALLERGIC RHINITIS    CHICKENPOX, HX OF 12/21/2009   Qualifier: Diagnosis of  By: Inocencio MD, Valerie A    Hyperthyroidism    normal TFTs 11/2009 , dx 10/2011   Varicose vein    s/p sclerosis tx summer 2013   Past Surgical History:  Procedure Laterality Date   ENDOVENOUS ABLATION SAPHENOUS VEIN W/ LASER  04/11/2012   endovenous laser ablation right greater saphenous vein and stab phlebectomy  right leg  by Krystal Doing MD   Patient Active Problem List   Diagnosis Date Noted   Foot pain, left 06/02/2021   Ptosis of right eyelid 04/12/2020   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 12/29/2018   Osteopenia 01/10/2017   Right carotid bruit 12/21/2016   Prediabetes 12/21/2016   Hypothyroidism 03/20/2012   Varicose veins of bilateral lower extremities with other complications 12/21/2011   Allergic rhinitis 12/21/2009   Cardiac murmur 12/21/2009    PCP: Glade Hope, MD  REFERRING PROVIDER: Royden Cedar MD REFERRING DIAG:  Diagnosis  M21.41,M21.42 (ICD-10-CM) - Pes planus of both feet    THERAPY DIAG:  Chronic pain of left knee  Stiffness of left knee, not elsewhere classified  Muscle weakness (generalized)  Other abnormalities of gait and mobility  Pain in right hip  Rationale for Evaluation and Treatment: Rehabilitation  ONSET DATE: increased right IT band pain over the past 2 months  SUBJECTIVE:   SUBJECTIVE STATEMENT: The  patient reports she has done pretty well over the past week.  She has switched to Aleve  which has decreased her pain significantly. She feels like most of her problem is back in her left knee.    Eval:  The patient reports a month-long history of IT band pain on the right side.  She feels like IT band pain may be coming from flatfoot.  She is having custom orthotics made by [employer] [facility].  She also has significant left knee OA.  She reports feeling like at times she has to shift weight off her right leg onto her left knee.  She has had PT in the past for her left knee.  She did not report any foot pain today.  She reports over the past week or so her right IT band pain is improved but is still there.  The patient also reports he feels like she is having more of an antalgic gait and a flexed trunk with ambulation.     PERTINENT HISTORY: Osteopenia, right carotid bruit, left foot pain; left knee OA PAIN:  Are you having pain? Yes: NPRS scale: 1/10 Pain location: left knee  Pain description: Aching Aggravating factors: Standing and walking Relieving factors: Rest  PRECAUTIONS: None  RED FLAGS: None   WEIGHT BEARING RESTRICTIONS: No  FALLS:  Has patient fallen in last 6 months? 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 Hobbies: Travel  PLOF: Independent  PATIENT GOALS: To have less pain and IT band  NEXT MD VISIT: Nothing scheduled  OBJECTIVE:  Note: Objective measures were completed at Evaluation unless otherwise noted.  DIAGNOSTIC FINDINGS:  Left knee x-ray:IMPRESSION: 1. Tricompartmental degenerative osteoarthritis most significant in the lateral compartment. 2. Trace suprapatellar knee joint effusion.   COGNITION: Overall cognitive status: Within functional limits for tasks assessed     SENSATION: WFL  EDEMA:  No edema noted in the left knee today   POSTURE: rounded  shoulders and forward head  PALPATION: Tender to palpation along the IT band large trigger point in the right gluteal  LOWER EXTREMITY ROM:  Passive ROM Right eval Left eval 10/20  Hip flexion     Hip extension     Hip abduction     Hip adduction     Hip internal rotation     Hip external rotation     Knee flexion     Knee extension  -5  -5  Ankle dorsiflexion     Ankle plantarflexion     Ankle inversion     Ankle eversion      (Blank rows = not tested)  LOWER EXTREMITY MMT:  MMT Right eval Left eval Right  11/8 Left 11/8     Hip flexion 24 22 29.8 29.5 23.0 25.9  Hip extension        Hip abduction 32.1 22.8 30.5 27.0 25.5 24.2  Hip adduction        Hip internal rotation        Hip external rotation        Knee flexion        Knee extension 37.2 27.1 32.4 32.5 28.8 29.0  Ankle dorsiflexion        Ankle plantarflexion        Ankle inversion        Ankle eversion         (Blank rows = not tested)   GAIT: Lateral shift from left to right.  Does not walk with terminal knee extension on the left.  Flexed trunk with ambulation  TODAY'S TREATMENT:                                                                                                                              DATE:   12/31  Re-assessed patients hip and knee.   Scour test on the right hip (+)   Manual: LAD to right LE to improve ROM. Mild pain noted in the groin   Debby trest 2x20 sec  LTR x15   Bridge with band 3x12  Hip abduction with cuing for technique green 3x10   Reviewed standing march   11/8 Manual: trigger point release to  IT band and right gluteal; review of self sift tissue  mobilization.  Grade 1 and 2 PA and AP mobilization of the left knee to improve extension.  SLR 3 x 12 each leg reviewed RPE and added 1.5 lbs weight for last 2 sets RPE of 6  Bridge with band 3x15 green  Bilateral hip abduction green 3x15 green RPE 56f 3   Heel raise 2x15  Standing slow march 2x15      10/22 Manual: trigger point release to  IT band and right gluteal; review of self sift tissue mobilization.   SLR 2 x 10 each leg Bridge with band 2 x 20 Bilateral hip abduction green 2 x 20  Leg press 3x10 15 lbs         10/20  Trigger Point Dry-Needling  Treatment instructions: Expect mild to moderate muscle soreness. S/S of pneumothorax if dry needled over a lung field, and to seek immediate medical attention should they occur. Patient verbalized understanding of these instructions and education.  Patient Consent Given: Yes Education handout provided: Yes Muscles treated: Right gluteal using a 0.30 X50 needle 2 times right IT band 2 times using a 0.30 X50 needle Electrical stimulation performed: No Parameters: N/A Treatment response/outcome: Great twitch  SLR 2 x 10 each leg Bridge with band 2 x 20 Bilateral hip abduction green 2 x 20  10/11 Manual: trigger point release to  IT band and right gluteal; review of self sift tissue mobilization.   Gait: put heel lift in shoe and assessed tolerance   Bridge with green band 3x12  Hip abduction green 3x15  SLR 2x20 left     Eval:  IT band removal Manual: Trigger point release to IT band and gluteal  PATIENT EDUCATION:  Education details: HEP, symptom management, reviewed strength measurements, POC Person educated: Patient Education method: Explanation, Demonstration, Tactile cues, Verbal cues, and Handouts Education comprehension: verbalized understanding, returned demonstration, verbal cues required, tactile cues required, and needs further education  HOME EXERCISE PROGRAM: [id number]   ASSESSMENT:  CLINICAL IMPRESSION: The pain in the patient's IT band has resolved. She has been working on her stretches and exercises. She is having more groin and hip pain. She had a positive scour test today. She has singiifcant limitations in IR and mild limitations in flexion. She has had a slight decrease in strength  since her last assessment. She plans on having her hip examined in January. She has a new orthotic. It appears to be ocntroling her pronation well. Her left knee is giving her no difficulty. We reviewed hip specific exercises today. She would benefit from further skilled therapy 1W8.  Eval: Patient is a 72 year old female who presents with right IT band pain, bilateral flatfoot, history of left knee pain range of motion.  Her bilateral flatfoot and left knee are likely contributory to right IT band pain at this time.  She has a strength deficit between her left and right side.  She has a significant lateral shift and forward trunk bending and standing.  She will be getting orthotics soon.  She was shown self IT band and gluteal soft tissue mobilization.  She would benefit from skilled therapy to improve strength on the left side to reduce right-sided symptoms.  She is active at baseline.  Therapy also reviewed previous HEP including ankle strengthening exercises.  OBJECTIVE IMPAIRMENTS: Abnormal gait, decreased balance, decreased endurance, decreased mobility, difficulty walking, decreased ROM, decreased strength, hypomobility, increased fascial restrictions, impaired flexibility, postural dysfunction, and pain.    ACTIVITY LIMITATIONS: standing, squatting, stairs, and locomotion level  PARTICIPATION LIMITATIONS: community activity, yard work, and travel   PERSONAL FACTORS: Fitness, Past/current experiences, and Time since onset of injury/illness/exacerbation are also affecting patient's functional outcome.    REHAB POTENTIAL: Excellent   CLINICAL DECISION MAKING: Stable/uncomplicated   EVALUATION COMPLEXITY: Low  GOALS: Goals reviewed with patient? Yes  SHORT TERM GOALS: Target date: 03/01/2023   Patient will increase left lower extremity strength by 5 pounds Baseline: Goal status:slight decrease 12/31  2.  Patient will report a 50% reduction in IT band pain Baseline:  Goal status:  achieved 12/31  3.  Patient will be independent with basic HEP Baseline:  Goal status:achieved 12/31  4. Patient will demonstrate full hip ROM Goal status Initial   LONG TERM GOALS: Target date: 03/29/2023 ]\   Patient will ambulate 1 mile with new orthotics without increased pain in order to improve community ambulation Baseline:  Goal status New othreotics working well 12/31 2.  Patient will go up and down 8 steps without increased pain in order to improve community ambulation Baseline:  Goal status: INITIAL  3.  Patient will have complete HEP to address left lower extremity strength Baseline:  Goal status: INITIAL     PLAN:  PT FREQUENCY: 1x/week  PT DURATION: 8 weeks  PLANNED INTERVENTIONS: Therapeutic exercises, Therapeutic activity, Neuromuscular re-education, Balance training, Gait training, Patient/Family education, Self Care, Joint mobilization, Stair training, DME instructions, Aquatic Therapy, Dry Needling, Electrical stimulation, Cryotherapy, Moist heat, Taping, Manual therapy, and Re-evaluation.   PLAN FOR NEXT SESSION: Continue with gross bilateral lower extremity strengthening.  Review ankle exercises including inversion and plantarflexion.  Continue manual therapy of IT band and gluteal.  Consider single-leg stance exercises on the left side and right side.  When patient gets her orthotics perform gait training.  Patient feels like she is bending over more consider posterior chain exercises.  Referring diagnosis?  Diagnosis  M21.41,M21.42 (ICD-10-CM) - Pes planus of both feet   Treatment diagnosis? (if different than referring diagnosis)  Diagnosis  M21.41,M21.42 (ICD-10-CM) - Pes planus of both feet  Right leg pain   What was this (referring dx) caused by? []  Surgery []  Fall [x]  Ongoing issue []  Arthritis []  Other: ____________  Laterality: []  Rt []  Lt []  Both  Check all possible CPT codes:  *CHOOSE 10 OR LESS*    []  02889 (Therapeutic  Exercise)  []  92507 (SLP Treatment)  []  97112 (Neuro Re-ed)   []  92526 (Swallowing Treatment)   []  97116 (Gait Training)   []  [id number] (Cognitive Training, 1st 15 minutes) []  97140 (Manual Therapy)   []  97130 (Cognitive Training, each add'l 15 minutes)  []  97164 (Re-evaluation)                              []  Other, List CPT Code ____________  []  97530 (Therapeutic Activities)     []  97535 (Self Care)   [x]  All codes above (97110 - 97535)  []  97012 (Mechanical Traction)  []  97014 (E-stim Unattended)  []  97032 (E-stim manual)  []  97033 (Ionto)  []  97035 (Ultrasound) []  97750 (Physical Performance Training) [x]  [id number] (Aquatic Therapy) []  97016 (Vasopneumatic Device) []  [id number] (Paraffin) []  97034 (Contrast Bath) []  97597 (Wound Care 1st 20 sq cm) []  97598 (Wound Care each add'l 20 sq cm) []  97760 (Orthotic Fabrication, Fitting, Training Initial) []  [id number] (Prosthetic Management and Training Initial) []  [id number] (Orthotic or Prosthetic Training/ Modification Subsequent)   Alm JINNY Don, PT 04/30/2023, 3:55 PM

## 2023-05-09 ENCOUNTER — Encounter: Payer: Self-pay | Admitting: Internal Medicine

## 2023-05-09 NOTE — Patient Instructions (Addendum)

## 2023-05-09 NOTE — Progress Notes (Signed)
 Subjective:    Patient ID: Roel DELENA Guys, female    DOB: 03-Aug-1950, 73 y.o.   MRN: [id number]      HPI Kristiane is here for a Physical exam and her chronic medical problems.      Medications and allergies reviewed with patient and updated if appropriate.  Current Outpatient Medications on File Prior to Visit  Medication Sig Dispense Refill  . loratadine  (CLARITIN ) 10 MG tablet Take 10 mg by mouth daily.    . RESTASIS  0.05 % ophthalmic emulsion Place 1 drop into both eyes 2 (two) times daily.     No current facility-administered medications on file prior to visit.    Review of Systems     Objective:  There were no vitals filed for this visit. There were no vitals filed for this visit. There is no height or weight on file to calculate BMI.  BP Readings from Last 3 Encounters:  10/12/23 (!) 167/70  07/28/23 131/76  07/22/23 (!) 175/73    Wt Readings from Last 3 Encounters:  07/26/23 158 lb 15.9 oz (72.1 kg)  07/22/23 159 lb (72.1 kg)  07/17/23 163 lb (73.9 kg)       Physical Exam Constitutional: She appears well-developed and well-nourished. No distress.  HENT:  Head: Normocephalic and atraumatic.  Right Ear: External ear normal. Normal ear canal and TM Left Ear: External ear normal.  Normal ear canal and TM Mouth/Throat: Oropharynx is clear and moist.  Eyes: Conjunctivae normal.  Neck: Neck supple. No tracheal deviation present. No thyromegaly present.  No carotid bruit  Cardiovascular: Normal rate, regular rhythm and normal heart sounds.   No murmur heard.  No edema. Pulmonary/Chest: Effort normal and breath sounds normal. No respiratory distress. She has no wheezes. She has no rales.  Breast: deferred   Abdominal: Soft. She exhibits no distension. There is no tenderness.  Lymphadenopathy: She has no cervical adenopathy.  Skin: Skin is warm and dry. She is not diaphoretic.  Psychiatric: She has a normal mood and affect. Her behavior is normal.      Lab Results  Component Value Date   WBC 7.0 10/12/2023   HGB 10.9 (L) 10/12/2023   HCT 33.3 (L) 10/12/2023   PLT 305 10/12/2023   GLUCOSE 148 (H) 10/12/2023   CHOL 226 (H) 05/24/2023   TRIG 51.0 05/24/2023   HDL 90.30 05/24/2023   LDLDIRECT 104.9 04/23/2007   LDLCALC 125 (H) 05/24/2023   ALT 12 10/12/2023   AST 16 10/12/2023   NA 141 10/12/2023   K 3.8 10/12/2023   CL 104 10/12/2023   CREATININE 0.61 10/12/2023   BUN 22 10/12/2023   CO2 24 10/12/2023   TSH 0.97 09/27/2023   HGBA1C 5.7 05/24/2023         Assessment & Plan:   Physical exam: Screening blood work  ordered Exercise   Weight   Substance abuse  none   Reviewed recommended immunizations.   Health Maintenance  Topic Date Due  . DEXA SCAN  10/11/2023  . Influenza Vaccine  11/29/2023  . Medicare Annual Wellness (AWV)  12/14/2023  . COVID-19 Vaccine (4 - 2025-26 season) 12/30/2023  . DTaP/Tdap/Td (2 - Td or Tdap) 06/06/2025  . Mammogram  12/17/2025  . Fecal DNA (Cologuard)  12/17/2026  . Pneumococcal Vaccine: 50+ Years  Completed  . Hepatitis C Screening  Completed  . Zoster Vaccines- Shingrix   Completed  . HPV VACCINES  Aged Out  . Meningococcal B Vaccine  Aged Out  See Problem List for Assessment and Plan of chronic medical problems.     This encounter was created in error - please disregard.

## 2023-05-10 ENCOUNTER — Encounter: Payer: Medicare PPO | Admitting: Internal Medicine

## 2023-05-23 ENCOUNTER — Telehealth (HOSPITAL_BASED_OUTPATIENT_CLINIC_OR_DEPARTMENT_OTHER): Payer: Self-pay | Admitting: Orthopaedic Surgery

## 2023-05-23 ENCOUNTER — Ambulatory Visit (HOSPITAL_BASED_OUTPATIENT_CLINIC_OR_DEPARTMENT_OTHER): Payer: Medicare PPO

## 2023-05-23 ENCOUNTER — Ambulatory Visit (HOSPITAL_BASED_OUTPATIENT_CLINIC_OR_DEPARTMENT_OTHER): Payer: Medicare PPO | Admitting: Orthopaedic Surgery

## 2023-05-23 DIAGNOSIS — G8929 Other chronic pain: Secondary | ICD-10-CM

## 2023-05-23 DIAGNOSIS — M47816 Spondylosis without myelopathy or radiculopathy, lumbar region: Secondary | ICD-10-CM | POA: Diagnosis not present

## 2023-05-23 DIAGNOSIS — E559 Vitamin D deficiency, unspecified: Secondary | ICD-10-CM | POA: Insufficient documentation

## 2023-05-23 DIAGNOSIS — M25512 Pain in left shoulder: Secondary | ICD-10-CM

## 2023-05-23 DIAGNOSIS — M1611 Unilateral primary osteoarthritis, right hip: Secondary | ICD-10-CM | POA: Diagnosis not present

## 2023-05-23 DIAGNOSIS — M8580 Other specified disorders of bone density and structure, unspecified site: Secondary | ICD-10-CM | POA: Diagnosis not present

## 2023-05-23 DIAGNOSIS — M25551 Pain in right hip: Secondary | ICD-10-CM

## 2023-05-23 DIAGNOSIS — M19012 Primary osteoarthritis, left shoulder: Secondary | ICD-10-CM | POA: Diagnosis not present

## 2023-05-23 NOTE — Progress Notes (Signed)
Chief Complaint: Left shoulder pain, right hip pain     History of Present Illness:   05/23/2023: Presents today with ongoing right hip pain in the setting of very limited motion despite stretching and strengthening with physical therapy.  She has not had any previous injections in this hip.  She experiences her pain in a C-shaped fashion along the groin area as well as into the lateral and posterior hip.  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  This has been chronic in nature.  This has been worsened after her trip to [place].  She is having pain going up and down stairs.  She states that she did have an injection in 2023 which caused significant side effects after steroid use.  She has been doing physical therapy in terms of a home exercise program but not a formalized program.  The pain is predominantly in the involving the lateral joint line.  She has not tried any bracing before.  She does state that she walks with a bit of a limp    Surgical History:   none  PMH/PSH/Family History/Social History/Meds/Allergies:    Past Medical History:  Diagnosis Date   ALLERGIC RHINITIS    CHICKENPOX, HX OF 12/21/2009   Qualifier: Diagnosis of  By: Felicity Coyer MD, Valerie A    Hyperthyroidism    normal TFTs 11/2009 , dx 10/2011   Varicose vein    s/p sclerosis tx summer 2013   Past Surgical History:  Procedure Laterality Date   ENDOVENOUS ABLATION SAPHENOUS VEIN W/ LASER  04/11/2012   endovenous laser ablation right greater saphenous vein and stab phlebectomy  right leg  by Gretta Began MD   Social History   Socioeconomic History   Marital status: Single     Spouse name: Not on file   Number of children: Not on file   Years of education: Not on file   Highest education level: Not on file  Occupational History   Not on file  Tobacco Use   Smoking status: Former    Current packs/day: 0.00    Types: Cigarettes    Quit date: 04/30/1988    Years since quitting: 35.0   Smokeless tobacco: Never  Vaping Use   Vaping status: Never Used  Substance and Sexual Activity   Alcohol use: Yes    Alcohol/week: 0.0 - 1.0 standard drinks of alcohol    Comment: 1-2 glasses a week   Drug use: No   Sexual activity: Not on file  Other Topics Concern   Not on file  Social History Narrative   Single, lives with 2 dtrs- Victory Dakin & Tresa Endo      Exercising regularly - walking dogs   Social Drivers of Health   Financial Resource Strain: Low Risk  (12/14/2022)   Overall Financial Resource Strain (CARDIA)    Difficulty of Paying Living Expenses: Not hard at all  Food Insecurity: No Food Insecurity (12/14/2022)   Hunger Vital Sign    Worried About Running Out of  Food in the Last Year: Never true    Ran Out of Food in the Last Year: Never true  Transportation Needs: No Transportation Needs (12/14/2022)   PRAPARE - Administrator, Civil Service (Medical): No    Lack of Transportation (Non-Medical): No  Physical Activity: Sufficiently Active (12/14/2022)   Exercise Vital Sign    Days of Exercise per Week: 7 days    Minutes of Exercise per Session: 30 min  Stress: No Stress Concern Present (12/14/2022)   [employer] of Occupational Health - Occupational Stress Questionnaire    Feeling of Stress : Not at all  Social Connections: Socially Integrated (12/14/2022)   Social Connection and Isolation Panel [NHANES]    Frequency of Communication with Friends and Family: More than three times a week    Frequency of Social Gatherings with Friends and Family: Once a week    Attends Religious Services: More than 4 times per year    Active Member of [employer] or  Organizations: Yes    Attends Engineer, structural: 1 to 4 times per year    Marital Status: Married   Family History  Problem Relation Age of Onset   Heart disease Father    Autoimmune disease Father    Hypertension Father    Other Father        varicose veins, AAA   Stroke Father    Hyperlipidemia Mother    Other Mother        varicose veins, AAA   Stroke Mother    Heart attack Mother 68   Stroke Brother    Hyperlipidemia Brother    Hyperlipidemia Brother    Hyperlipidemia Sister    Diabetes Maternal Grandfather    Diabetes Paternal Grandfather    Breast cancer Maternal Aunt    Allergies  Allergen Reactions   Prednisone Other (See Comments)    Very agitated and near psychotic per patient-this was several years ago-not exactly sure if was prednisone or different steroid   Clobetasol Propionate Other (See Comments)    anxious   Current Outpatient Medications  Medication Sig Dispense Refill   ibuprofen (ADVIL) 200 MG tablet Take 200 mg by mouth every 6 (six) hours as needed.     levothyroxine (SYNTHROID) 112 MCG tablet Take 1 tablet (112 mcg total) by mouth daily before breakfast. 90 tablet 0   loratadine (CLARITIN) 10 MG tablet Take 10 mg by mouth as needed.      RESTASIS 0.05 % ophthalmic emulsion      No current facility-administered medications for this visit.   No results found.  Review of Systems:   A ROS was performed including pertinent positives and negatives as documented in the HPI.  Physical Exam :   Constitutional: NAD and appears stated age Neurological: Alert and oriented Psych: Appropriate affect and cooperative There were no vitals taken for this visit.   Comprehensive Musculoskeletal Exam:      Musculoskeletal Exam  Gait Normal  Alignment Valgus alignment of the left   Right Left  Inspection Normal Normal  Palpation    Tenderness None Lateral joint  Crepitus None None  Effusion None None  Range of Motion    Extension 0 0   Flexion 0135 135  Strength    Extension 5/5 5/5  Flexion 5/5 5/5  Ligament Exam     Generalized Laxity No No  Lachman Negative Negative   Pivot Shift Negative Negative  Anterior Drawer Negative Negative  Valgus at 0 Negative  Negative  Valgus at 20 Negative Negative  Varus at 0 0 0  Varus at 20   0 0  Posterior Drawer at 90 0 0  Vascular/Lymphatic Exam    Edema None None  Venous Stasis Changes No No  Distal Circulation Normal Normal  Neurologic    Light Touch Sensation Intact Intact  Special Tests:      Right hip with internal/external rotation of only approximately 10 degrees with pain.  Positive crepitus femoral acetabular joint.  Positive FADIR.  Distal neurosensory exam is intact, left shoulder with full painless range of motion.  There is some tightness and fullness there and pectoralis minor.  No significant scapular winging  Imaging:   Xray (4 views left knee, 4 views right hip, 3 views left shoulder): Predominantly lateral Tibiofemoral osteoarthritis with marginal osteophytes.  This is moderate in nature\  Advanced osteoarthritis of the right hip.  Normal left shoulder   I personally reviewed and interpreted the radiographs.   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  Overall I do believe that this is likely result of her advanced right hip osteoarthritis.  I did discuss that given that she is essentially bone-on-bone in the right hip I would ultimately recommend a referral to my partner Dr. Magnus Ivan for discussion of hip arthroplasty.  I do believe that this is also likely flaring up the left knee.  She would like to trial the gel again to the left knee as this did give her significant relief.  Will plan for prior authorization for this and I will see her back for left knee hyaluronic acid injection  Plan :    -Plan for physical therapy left shoulder for anterior chain mobilization and posterior chain strengthening, referral  to Dr. Magnus Ivan for discussion of right total hip arthroplasty, return to clinic for left knee hyaluronic acid injection  I personally saw and evaluated the patient, and participated in the management and treatment plan.  Huel Cote, MD Attending Physician, Orthopedic Surgery  This document was dictated using Dragon voice recognition software. A reasonable attempt at proof reading has been made to minimize errors.

## 2023-05-23 NOTE — Patient Instructions (Addendum)
Monitor your BP.     Blood work was ordered.       Medications changes include :   None    An ultrasound of your carotid arteries, echocardiogram, Ct of your heart arteries was ordered and someone will call you to schedule an appointment.     Return in about 1 year (around 05/23/2024) for Physical Exam.   Health Maintenance, Female Adopting a healthy lifestyle and getting preventive care are important in promoting health and wellness. Ask your health care provider about: The right schedule for you to have regular tests and exams. Things you can do on your own to prevent diseases and keep yourself healthy. What should I know about diet, weight, and exercise? Eat a healthy diet  Eat a diet that includes plenty of vegetables, fruits, low-fat dairy products, and lean protein. Do not eat a lot of foods that are high in solid fats, added sugars, or sodium. Maintain a healthy weight Body mass index (BMI) is used to identify weight problems. It estimates body fat based on height and weight. Your health care provider can help determine your BMI and help you achieve or maintain a healthy weight. Get regular exercise Get regular exercise. This is one of the most important things you can do for your health. Most adults should: Exercise for at least 150 minutes each week. The exercise should increase your heart rate and make you sweat (moderate-intensity exercise). Do strengthening exercises at least twice a week. This is in addition to the moderate-intensity exercise. Spend less time sitting. Even light physical activity can be beneficial. Watch cholesterol and blood lipids Have your blood tested for lipids and cholesterol at 71 years of age, then have this test every 5 years. Have your cholesterol levels checked more often if: Your lipid or cholesterol levels are high. You are older than 71 years of age. You are at high risk for heart disease. What should I know about cancer  screening? Depending on your health history and family history, you may need to have cancer screening at various ages. This may include screening for: Breast cancer. Cervical cancer. Colorectal cancer. Skin cancer. Lung cancer. What should I know about heart disease, diabetes, and high blood pressure? Blood pressure and heart disease High blood pressure causes heart disease and increases the risk of stroke. This is more likely to develop in people who have high blood pressure readings or are overweight. Have your blood pressure checked: Every 3-5 years if you are 73-66 years of age. Every year if you are 36 years old or older. Diabetes Have regular diabetes screenings. This checks your fasting blood sugar level. Have the screening done: Once every three years after age 46 if you are at a normal weight and have a low risk for diabetes. More often and at a younger age if you are overweight or have a high risk for diabetes. What should I know about preventing infection? Hepatitis B If you have a higher risk for hepatitis B, you should be screened for this virus. Talk with your health care provider to find out if you are at risk for hepatitis B infection. Hepatitis C Testing is recommended for: Everyone born from [birth year] through 1963. Anyone with known risk factors for hepatitis C. Sexually transmitted infections (STIs) Get screened for STIs, including gonorrhea and chlamydia, if: You are sexually active and are younger than 71 years of age. You are older than 71 years of age and your health care provider tells you  that you are at risk for this type of infection. Your sexual activity has changed since you were last screened, and you are at increased risk for chlamydia or gonorrhea. Ask your health care provider if you are at risk. Ask your health care provider about whether you are at high risk for HIV. Your health care provider may recommend a prescription medicine to help prevent HIV  infection. If you choose to take medicine to prevent HIV, you should first get tested for HIV. You should then be tested every 3 months for as long as you are taking the medicine. Pregnancy If you are about to stop having your period (premenopausal) and you may become pregnant, seek counseling before you get pregnant. Take 400 to 800 micrograms (mcg) of folic acid every day if you become pregnant. Ask for birth control (contraception) if you want to prevent pregnancy. Osteoporosis and menopause Osteoporosis is a disease in which the bones lose minerals and strength with aging. This can result in bone fractures. If you are 69 years old or older, or if you are at risk for osteoporosis and fractures, ask your health care provider if you should: Be screened for bone loss. Take a calcium or vitamin D supplement to lower your risk of fractures. Be given hormone replacement therapy (HRT) to treat symptoms of menopause. Follow these instructions at home: Alcohol use Do not drink alcohol if: Your health care provider tells you not to drink. You are pregnant, may be pregnant, or are planning to become pregnant. If you drink alcohol: Limit how much you have to: 0-1 drink a day. Know how much alcohol is in your drink. In the [place] one drink equals one 12 oz bottle of beer (355 mL), one 5 oz glass of wine (148 mL), or one 1 oz glass of hard liquor (44 mL). Lifestyle Do not use any products that contain nicotine or tobacco. These products include cigarettes, chewing tobacco, and vaping devices, such as e-cigarettes. If you need help quitting, ask your health care provider. Do not use street drugs. Do not share needles. Ask your health care provider for help if you need support or information about quitting drugs. General instructions Schedule regular health, dental, and eye exams. Stay current with your vaccines. Tell your health care provider if: You often feel depressed. You have ever been abused  or do not feel safe at home. Summary Adopting a healthy lifestyle and getting preventive care are important in promoting health and wellness. Follow your health care provider's instructions about healthy diet, exercising, and getting tested or screened for diseases. Follow your health care provider's instructions on monitoring your cholesterol and blood pressure. This information is not intended to replace advice given to you by your health care provider. Make sure you discuss any questions you have with your health care provider. Document Revised: 09/05/2020 Document Reviewed: 09/05/2020 Elsevier Patient Education  2024 [employer].

## 2023-05-23 NOTE — Telephone Encounter (Signed)
GEl injection PA

## 2023-05-23 NOTE — Progress Notes (Signed)
Subjective:    Patient ID: Jaime Rose, female    DOB: [birth year], 73 y.o.   MRN: [id number]      HPI Jaime Rose is here for a Physical exam and her chronic medical problems.     Needs R hip replacement.  Her pain really started in the last few months but has gotten worse.  She is still doing water exercises.    Medications and allergies reviewed with patient and updated if appropriate.  Current Outpatient Medications on File Prior to Visit  Medication Sig Dispense Refill   levothyroxine (SYNTHROID) 112 MCG tablet Take 1 tablet (112 mcg total) by mouth daily before breakfast. 90 tablet 0   loratadine (CLARITIN) 10 MG tablet Take 10 mg by mouth as needed.      naproxen sodium (ALEVE) 220 MG tablet Take 220 mg by mouth.     RESTASIS 0.05 % ophthalmic emulsion      No current facility-administered medications on file prior to visit.    Review of Systems  Constitutional:  Negative for fever.  Eyes:  Negative for visual disturbance.  Respiratory:  Negative for cough, shortness of breath and wheezing.   Cardiovascular:  Negative for chest pain, palpitations and leg swelling.  Gastrointestinal:  Negative for abdominal pain, blood in stool, constipation and diarrhea.       No gerd  Genitourinary:  Negative for dysuria.  Musculoskeletal:  Positive for arthralgias (right hip, left knee, shoulder). Negative for back pain.  Skin:  Negative for rash.  Neurological:  Negative for light-headedness and headaches.  Psychiatric/Behavioral:  Negative for dysphoric mood. The patient is not nervous/anxious.        Objective:   Vitals:   05/24/23 0909  BP: (!) 140/80  Pulse: 61  Temp: 97.8 F (36.6 C)  SpO2: 98%   Filed Weights   05/24/23 0909  Weight: 162 lb 6.4 oz (73.7 kg)   Body mass index is 27.88 kg/m.  BP Readings from Last 3 Encounters:  05/24/23 (!) 140/80  12/14/22 (!) 140/80  05/07/22 138/76    Wt Readings from Last 3 Encounters:  05/24/23 162 lb 6.4  oz (73.7 kg)  01/22/23 166 lb (75.3 kg)  12/14/22 166 lb 6.4 oz (75.5 kg)       Physical Exam Constitutional: She appears well-developed and well-nourished. No distress.  HENT:  Head: Normocephalic and atraumatic.  Right Ear: External ear normal. Normal ear canal and TM Left Ear: External ear normal.  Normal ear canal and TM Mouth/Throat: Oropharynx is clear and moist.  Eyes: Conjunctivae normal.  Neck: Neck supple. No tracheal deviation present. No thyromegaly present.  No carotid bruit  Cardiovascular: Normal rate, regular rhythm and normal heart sounds.   2/6 sys murmur heard.  No edema. Pulmonary/Chest: Effort normal and breath sounds normal. No respiratory distress. She has no wheezes. She has no rales.  Breast: deferred   Abdominal: Soft. She exhibits no distension. There is no tenderness.  Lymphadenopathy: She has no cervical adenopathy.  Skin: Skin is warm and dry. She is not diaphoretic.  Psychiatric: She has a normal mood and affect. Her behavior is normal.     Lab Results  Component Value Date   WBC 4.9 05/24/2023   HGB 12.7 05/24/2023   HCT 38.5 05/24/2023   PLT 313.0 05/24/2023   GLUCOSE 92 05/24/2023   CHOL 226 (H) 05/24/2023   TRIG 51.0 05/24/2023   HDL 90.30 05/24/2023   LDLDIRECT 104.9 04/23/2007   LDLCALC 125 (H)  05/24/2023   ALT 12 05/24/2023   AST 15 05/24/2023   NA 141 05/24/2023   K 4.0 05/24/2023   CL 104 05/24/2023   CREATININE 0.66 05/24/2023   BUN 15 05/24/2023   CO2 31 05/24/2023   TSH 10.87 (H) 05/24/2023   HGBA1C 5.7 05/24/2023         Assessment & Plan:   Physical exam: Screening blood work  ordered Exercise  water exercises Weight  overweight Substance abuse  none   Reviewed recommended immunizations.   Health Maintenance  Topic Date Due   COVID-19 Vaccine (4 - 2024-25 season) 06/09/2023 (Originally 12/30/2022)   Fecal DNA (Cologuard)  06/28/2023   DEXA SCAN  10/11/2023   Medicare Annual Wellness (AWV)  12/14/2023    MAMMOGRAM  10/18/2024   DTaP/Tdap/Td (2 - Td or Tdap) 06/06/2025   Pneumonia Vaccine 95+ Years old  Completed   INFLUENZA VACCINE  Completed   Hepatitis C Screening  Completed   Zoster Vaccines- Shingrix  Completed   HPV VACCINES  Aged Out          See Problem List for Assessment and Plan of chronic medical problems.

## 2023-05-24 ENCOUNTER — Ambulatory Visit (INDEPENDENT_AMBULATORY_CARE_PROVIDER_SITE_OTHER): Payer: Medicare PPO | Admitting: Internal Medicine

## 2023-05-24 ENCOUNTER — Encounter: Payer: Self-pay | Admitting: Internal Medicine

## 2023-05-24 VITALS — BP 140/80 | HR 61 | Temp 97.8°F | Ht 64.0 in | Wt 162.4 lb

## 2023-05-24 DIAGNOSIS — R011 Cardiac murmur, unspecified: Secondary | ICD-10-CM

## 2023-05-24 DIAGNOSIS — Z136 Encounter for screening for cardiovascular disorders: Secondary | ICD-10-CM | POA: Diagnosis not present

## 2023-05-24 DIAGNOSIS — M85859 Other specified disorders of bone density and structure, unspecified thigh: Secondary | ICD-10-CM

## 2023-05-24 DIAGNOSIS — E559 Vitamin D deficiency, unspecified: Secondary | ICD-10-CM

## 2023-05-24 DIAGNOSIS — E039 Hypothyroidism, unspecified: Secondary | ICD-10-CM | POA: Diagnosis not present

## 2023-05-24 DIAGNOSIS — I6523 Occlusion and stenosis of bilateral carotid arteries: Secondary | ICD-10-CM | POA: Diagnosis not present

## 2023-05-24 DIAGNOSIS — Z0000 Encounter for general adult medical examination without abnormal findings: Secondary | ICD-10-CM | POA: Diagnosis not present

## 2023-05-24 DIAGNOSIS — R7303 Prediabetes: Secondary | ICD-10-CM

## 2023-05-24 LAB — CBC WITH DIFFERENTIAL/PLATELET
Basophils Absolute: 0.1 10*3/uL (ref 0.0–0.1)
Basophils Relative: 1.3 % (ref 0.0–3.0)
Eosinophils Absolute: 0.1 10*3/uL (ref 0.0–0.7)
Eosinophils Relative: 3 % (ref 0.0–5.0)
HCT: 38.5 % (ref 36.0–46.0)
Hemoglobin: 12.7 g/dL (ref 12.0–15.0)
Lymphocytes Relative: 26.8 % (ref 12.0–46.0)
Lymphs Abs: 1.3 10*3/uL (ref 0.7–4.0)
MCHC: 32.9 g/dL (ref 30.0–36.0)
MCV: 90.9 fL (ref 78.0–100.0)
Monocytes Absolute: 0.5 10*3/uL (ref 0.1–1.0)
Monocytes Relative: 9.3 % (ref 3.0–12.0)
Neutro Abs: 2.9 10*3/uL (ref 1.4–7.7)
Neutrophils Relative %: 59.6 % (ref 43.0–77.0)
Platelets: 313 10*3/uL (ref 150.0–400.0)
RBC: 4.23 Mil/uL (ref 3.87–5.11)
RDW: 13.9 % (ref 11.5–15.5)
WBC: 4.9 10*3/uL (ref 4.0–10.5)

## 2023-05-24 LAB — LIPID PANEL
Cholesterol: 226 mg/dL — ABNORMAL HIGH (ref 0–200)
HDL: 90.3 mg/dL (ref >39.00)
LDL Cholesterol: 125 mg/dL — ABNORMAL HIGH (ref 0–99)
NonHDL: 135.61
Total CHOL/HDL Ratio: 3
Triglycerides: 51 mg/dL (ref 0.0–149.0)
VLDL: 10.2 mg/dL (ref 0.0–40.0)

## 2023-05-24 LAB — COMPREHENSIVE METABOLIC PANEL
ALT: 12 U/L (ref 0–35)
AST: 15 U/L (ref 0–37)
Albumin: 4.2 g/dL (ref 3.5–5.2)
Alkaline Phosphatase: 77 U/L (ref 39–117)
BUN: 15 mg/dL (ref 6–23)
CO2: 31 meq/L (ref 19–32)
Calcium: 9.8 mg/dL (ref 8.4–10.5)
Chloride: 104 meq/L (ref 96–112)
Creatinine, Ser: 0.66 mg/dL (ref 0.40–1.20)
GFR: 88.86 mL/min (ref >60.00)
Glucose, Bld: 92 mg/dL (ref 70–99)
Potassium: 4 meq/L (ref 3.5–5.1)
Sodium: 141 meq/L (ref 135–145)
Total Bilirubin: 0.5 mg/dL (ref 0.2–1.2)
Total Protein: 7.6 g/dL (ref 6.0–8.3)

## 2023-05-24 LAB — HEMOGLOBIN A1C: Hgb A1c MFr Bld: 5.7 % (ref 4.6–6.5)

## 2023-05-24 LAB — TSH: TSH: 10.87 u[IU]/mL — ABNORMAL HIGH (ref 0.35–5.50)

## 2023-05-24 LAB — VITAMIN D 25 HYDROXY (VIT D DEFICIENCY, FRACTURES): VITD: 26.32 ng/mL — ABNORMAL LOW (ref 30.00–100.00)

## 2023-05-24 NOTE — Assessment & Plan Note (Signed)
Chronic Taking vitamin d daily Check vitamin d level

## 2023-05-24 NOTE — Assessment & Plan Note (Signed)
Chronic Will order echocardiogram to evaluate

## 2023-05-24 NOTE — Assessment & Plan Note (Addendum)
Chronic Mild disease in the past Recheck ultrasound to evaluate for any progression Likely needs to start a statin Will be getting CT CAC to evaluate cardiac atherosclerosis and then will consider starting statin

## 2023-05-24 NOTE — Assessment & Plan Note (Addendum)
Chronic DEXA up-to-date-due later this year and we will order this next year Encouraged regular exercise Advised taking calcium 500-600 mg daily and vitamin D 1000 units daily

## 2023-05-24 NOTE — Assessment & Plan Note (Signed)
Chronic  Clinically euthyroid Check tsh and will titrate med dose if needed Currently taking levothyroxine 112 mcg daily

## 2023-05-24 NOTE — Assessment & Plan Note (Signed)
Chronic Lab Results  Component Value Date   HGBA1C 5.8 05/07/2022   Check a1c Low sugar / carb diet Stressed regular exercise

## 2023-05-25 ENCOUNTER — Encounter: Payer: Self-pay | Admitting: Internal Medicine

## 2023-05-25 MED ORDER — LEVOTHYROXINE SODIUM 112 MCG PO TABS: ORAL_TABLET | ORAL | 0 refills | Status: DC

## 2023-05-25 NOTE — Addendum Note (Signed)
Addended by: Pincus Sanes on: 05/25/2023 04:41 PM   Modules accepted: Orders

## 2023-05-29 NOTE — Telephone Encounter (Signed)
VOB submitted for Monovisc, left knee

## 2023-06-10 ENCOUNTER — Encounter: Payer: Self-pay | Admitting: Orthopaedic Surgery

## 2023-06-10 ENCOUNTER — Ambulatory Visit: Payer: Medicare PPO | Admitting: Orthopaedic Surgery

## 2023-06-10 VITALS — Ht 64.0 in | Wt 165.0 lb

## 2023-06-10 DIAGNOSIS — M1611 Unilateral primary osteoarthritis, right hip: Secondary | ICD-10-CM | POA: Diagnosis not present

## 2023-06-10 DIAGNOSIS — M25551 Pain in right hip: Secondary | ICD-10-CM | POA: Diagnosis not present

## 2023-06-10 NOTE — Progress Notes (Signed)
 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  She actually works at [employer].  She was referred to by my partner Dr. Hermina Loosen.  She has been dealing with right hip pain for many years now but it has been getting worse and it is detrimentally affecting her mobility, her quality of life and her actives daily living.  She is a patient of Dr. Oma Bias and the patient did let me know that she is having a cardiac score coming up as well as an echo and other studies and exams for health maintenance overall.  I did review her past medical history and medications within epic.  She is interested in discussing hip replacement surgery today and her daughter is with her.  She does work in Biomedical scientist.  She is an active individual.  On exam she does walk with a limp.  She has significant stiffness with rotation of her right hip with significant pain in the groin as well.  Her left hip moves smoothly and fluidly.  She has slight valgus malalignment of her knees when she stands.  X-rays on the canopy system that accompany her show bone-on-bone wear of the right hip.  There is complete loss of joint space with flattening of the femoral head as well.  The left hip joint space is well-maintained.  We had a long and thorough discussion about hip replacement surgery.  I went over her x-rays and a hip replacement model and even gave her handout about hip replacement surgery.  We discussed the risks and benefits of the surgery and what to expect from an intraoperative and postoperative standpoint.  All questions and concerns were answered and addressed.  Will work on getting her scheduled for right total hip arthroplasty.

## 2023-06-14 ENCOUNTER — Ambulatory Visit (HOSPITAL_BASED_OUTPATIENT_CLINIC_OR_DEPARTMENT_OTHER)
Admission: RE | Admit: 2023-06-14 | Discharge: 2023-06-14 | Disposition: A | Discharge status: Home / Self Care | Payer: Self-pay | Source: Ambulatory Visit | Attending: Internal Medicine | Admitting: Internal Medicine

## 2023-06-14 DIAGNOSIS — Z136 Encounter for screening for cardiovascular disorders: Secondary | ICD-10-CM | POA: Insufficient documentation

## 2023-06-15 ENCOUNTER — Encounter: Payer: Self-pay | Admitting: Internal Medicine

## 2023-06-18 ENCOUNTER — Other Ambulatory Visit: Payer: Self-pay

## 2023-06-18 ENCOUNTER — Encounter (HOSPITAL_BASED_OUTPATIENT_CLINIC_OR_DEPARTMENT_OTHER): Payer: Self-pay | Admitting: Orthopaedic Surgery

## 2023-06-18 DIAGNOSIS — M1712 Unilateral primary osteoarthritis, left knee: Secondary | ICD-10-CM

## 2023-06-20 ENCOUNTER — Encounter (HOSPITAL_BASED_OUTPATIENT_CLINIC_OR_DEPARTMENT_OTHER): Payer: Medicare PPO

## 2023-06-20 ENCOUNTER — Other Ambulatory Visit (HOSPITAL_BASED_OUTPATIENT_CLINIC_OR_DEPARTMENT_OTHER): Payer: Medicare PPO

## 2023-06-28 ENCOUNTER — Ambulatory Visit (HOSPITAL_BASED_OUTPATIENT_CLINIC_OR_DEPARTMENT_OTHER): Payer: Medicare PPO | Admitting: Orthopaedic Surgery

## 2023-06-28 DIAGNOSIS — M1712 Unilateral primary osteoarthritis, left knee: Secondary | ICD-10-CM | POA: Diagnosis not present

## 2023-06-28 MED ORDER — HYALURONAN 88 MG/4ML IX SOSY
88.0000 mg | PREFILLED_SYRINGE | INTRA_ARTICULAR | Status: AC | PRN
  Administered 2023-06-28: 88 mg via INTRA_ARTICULAR

## 2023-06-28 NOTE — Progress Notes (Signed)
 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  This has been chronic in nature.  This has been worsened after her trip to [place].  She is having pain going up and down stairs.  She states that she did have an injection in 2023 which caused significant side effects after steroid use.  She has been doing physical therapy in terms of a home exercise program but not a formalized program.  The pain is predominantly in the involving the lateral joint line.  She has not tried any bracing before.  She does state that she walks with a bit of a limp    Surgical History:   none  PMH/PSH/Family History/Social History/Meds/Allergies:    Past Medical History:  Diagnosis Date  . ALLERGIC RHINITIS   . Allergy 1985   seasonal allergies  . CHICKENPOX, HX OF 12/21/2009   Qualifier: Diagnosis of  By: Felicity Coyer MD, Raenette Rover Hyperthyroidism    normal TFTs 11/2009 , dx 10/2011  . Varicose vein    s/p sclerosis tx summer 2013   Past Surgical History:  Procedure Laterality Date  . ENDOVENOUS ABLATION SAPHENOUS VEIN W/ LASER  04/11/2012   endovenous laser ablation right greater saphenous vein and stab phlebectomy  right leg  by Gretta Began MD   Social History   Socioeconomic History  . Marital status: Single    Spouse name: Not on file  . Number of children: Not on file  . Years of education: Not on file  . Highest education level: Master's degree (e.g., MA, MS, MEng, MEd, MSW, MBA)  Occupational History  . Not on file  Tobacco Use  . Smoking status: Former    Current packs/day: 0.00    Types: Cigarettes    Quit date: 04/30/1988    Years since quitting: 35.1  . Smokeless tobacco: Never  Vaping  Use  . Vaping status: Never Used  Substance and Sexual Activity  . Alcohol use: Yes    Alcohol/week: 1.0 - 2.0 standard drink of alcohol    Types: 1 Glasses of wine per week    Comment: 1-2 glasses a week  . Drug use: No  . Sexual activity: Not Currently    Birth control/protection: Abstinence  Other Topics Concern  . Not on file  Social History Narrative   Single, lives with 2 dtrs- Victory Dakin & Tresa Endo      Exercising regularly - walking dogs   Social Drivers of Health   Financial Resource Strain: Low Risk  (05/23/2023)   Overall Financial Resource Strain (CARDIA)   . Difficulty of Paying Living Expenses: Not hard at all  Food Insecurity: No Food Insecurity (05/23/2023)   Hunger Vital Sign   . Worried About Programme researcher, broadcasting/film/video in the Last Year: Never true   . Ran Out of Food in the Last Year: Never true  Transportation Needs: No Transportation Needs (05/23/2023)   PRAPARE - Transportation   . Lack of Transportation (Medical): No   . Lack of Transportation (Non-Medical): No  Physical Activity:  Sufficiently Active (05/23/2023)   Exercise Vital Sign   . Days of Exercise per Week: 7 days   . Minutes of Exercise per Session: 40 min  Stress: No Stress Concern Present (05/23/2023)   [employer] of Occupational Health - Occupational Stress Questionnaire   . Feeling of Stress : Not at all  Social Connections: Moderately Integrated (05/23/2023)   Social Connection and Isolation Panel [NHANES]   . Frequency of Communication with Friends and Family: More than three times a week   . Frequency of Social Gatherings with Friends and Family: Once a week   . Attends Religious Services: More than 4 times per year   . Active Member of Clubs or Organizations: Yes   . Attends Banker Meetings: More than 4 times per year   . Marital Status: Never married   Family History  Problem Relation Age of Onset  . Heart disease Father   . Autoimmune disease Father   . Hypertension Father    . Other Father        varicose veins, AAA  . Stroke Father   . Depression Father   . Varicose Veins Father   . Hyperlipidemia Mother   . Other Mother        varicose veins, AAA  . Stroke Mother   . Heart attack Mother 44  . Heart disease Mother   . Miscarriages / [place] Mother   . Varicose Veins Mother   . Stroke Brother   . Hyperlipidemia Brother   . Hyperlipidemia Brother   . Stroke Brother   . Hyperlipidemia Sister   . Diabetes Maternal Grandfather   . Cancer Maternal Grandfather   . Diabetes Paternal Grandfather   . Breast cancer Maternal Aunt   . Cancer Maternal Aunt    Allergies  Allergen Reactions  . Prednisone Other (See Comments)    Very agitated and near psychotic per patient-this was several years ago-not exactly sure if was prednisone or different steroid  . Clobetasol Propionate Other (See Comments)    anxious   Current Outpatient Medications  Medication Sig Dispense Refill  . levothyroxine (SYNTHROID) 112 MCG tablet Take 1 tablet (112 mcg) by mouth daily before breakfast 6 days a week and 2 tablets (224 mcg ) by mouth one day a week. 104 tablet 0  . loratadine (CLARITIN) 10 MG tablet Take 10 mg by mouth as needed.     . naproxen sodium (ALEVE) 220 MG tablet Take 220 mg by mouth.    . RESTASIS 0.05 % ophthalmic emulsion      No current facility-administered medications for this visit.   No results found.  Review of Systems:   A ROS was performed including pertinent positives and negatives as documented in the HPI.  Physical Exam :   Constitutional: NAD and appears stated age Neurological: Alert and oriented Psych: Appropriate affect and cooperative There were no vitals taken for this visit.   Comprehensive Musculoskeletal Exam:      Musculoskeletal Exam  Gait Normal  Alignment Valgus alignment of the left   Right Left  Inspection Normal Normal  Palpation    Tenderness None Lateral joint  Crepitus None None  Effusion None None  Range  of Motion    Extension 0 0  Flexion 0135 135  Strength    Extension 5/5 5/5  Flexion 5/5 5/5  Ligament Exam     Generalized Laxity No No  Lachman Negative Negative   Pivot Shift Negative Negative  Anterior Drawer  Negative Negative  Valgus at 0 Negative Negative  Valgus at 20 Negative Negative  Varus at 0 0 0  Varus at 20   0 0  Posterior Drawer at 90 0 0  Vascular/Lymphatic Exam    Edema None None  Venous Stasis Changes No No  Distal Circulation Normal Normal  Neurologic    Light Touch Sensation Intact Intact  Special Tests:      Right hip with internal/external rotation of only approximately 10 degrees with pain.  Positive crepitus femoral acetabular joint.  Positive FADIR.  Distal neurosensory exam is intact, left shoulder with full painless range of motion.  There is some tightness and fullness there and pectoralis minor.  No significant scapular winging  Imaging:   Xray (4 views left knee, 4 views right hip, 3 views left shoulder): Predominantly lateral Tibiofemoral osteoarthritis with marginal osteophytes.  This is moderate in nature\  Advanced osteoarthritis of the right hip.  Normal left shoulder   I personally reviewed and interpreted the radiographs.   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  Overall I do believe that this is likely result of her advanced right hip osteoarthritis.  I did discuss that given that she is essentially bone-on-bone in the right hip I would ultimately recommend a referral to my partner Dr. Magnus Ivan for discussion of hip arthroplasty.  I do believe that this is also likely flaring up the left knee.  She presents today for hyaluronic acid and gel injection.  This was provided after verbal consent was obtained  Plan :    -Left knee hyaluronic acid injection performed today after verbal consent obtained    Procedure Note  Patient: ZEPPELIN BECKSTRAND             Date of Birth: May 12, 1950            MRN: [id number]             Visit Date: 06/28/2023  Procedures: Visit Diagnoses: No diagnosis found.  Large Joint Inj: L knee on 06/28/2023 3:03 PM Indications: pain Details: 22 G 1.5 in needle, ultrasound-guided anterior approach  Arthrogram: No  Medications: 88 mg Hyaluronan 88 MG/4ML Outcome: tolerated well, no immediate complications Procedure, treatment alternatives, risks and benefits explained, specific risks discussed. Consent was given by the patient. Immediately prior to procedure a time out was called to verify the correct patient, procedure, equipment, support staff and site/side marked as required. Patient was prepped and draped in the usual sterile fashion.       I personally saw and evaluated the patient, and participated in the management and treatment plan.  Huel Cote, MD Attending Physician, Orthopedic Surgery  This document was dictated using Dragon voice recognition software. A reasonable attempt at proof reading has been made to minimize errors.

## 2023-07-09 ENCOUNTER — Other Ambulatory Visit: Payer: Self-pay

## 2023-07-10 ENCOUNTER — Ambulatory Visit (INDEPENDENT_AMBULATORY_CARE_PROVIDER_SITE_OTHER): Payer: Medicare PPO

## 2023-07-10 DIAGNOSIS — I340 Nonrheumatic mitral (valve) insufficiency: Secondary | ICD-10-CM

## 2023-07-10 DIAGNOSIS — I6523 Occlusion and stenosis of bilateral carotid arteries: Secondary | ICD-10-CM | POA: Diagnosis not present

## 2023-07-10 DIAGNOSIS — R011 Cardiac murmur, unspecified: Secondary | ICD-10-CM | POA: Diagnosis not present

## 2023-07-10 LAB — ECHOCARDIOGRAM COMPLETE
Area-P 1/2: 4.8 cm2
MV M vel: 5.81 m/s
MV Peak grad: 135 mmHg
Radius: 0.6 cm
S' Lateral: 3.18 cm

## 2023-07-11 ENCOUNTER — Encounter: Payer: Self-pay | Admitting: Internal Medicine

## 2023-07-11 DIAGNOSIS — I340 Nonrheumatic mitral (valve) insufficiency: Secondary | ICD-10-CM | POA: Insufficient documentation

## 2023-07-17 ENCOUNTER — Encounter: Payer: Self-pay | Admitting: Cardiovascular Disease

## 2023-07-17 ENCOUNTER — Ambulatory Visit: Attending: Cardiovascular Disease | Admitting: Cardiovascular Disease

## 2023-07-17 VITALS — BP 166/70 | HR 67 | Ht 65.0 in | Wt 163.0 lb

## 2023-07-17 DIAGNOSIS — R931 Abnormal findings on diagnostic imaging of heart and coronary circulation: Secondary | ICD-10-CM | POA: Insufficient documentation

## 2023-07-17 DIAGNOSIS — Z01818 Encounter for other preprocedural examination: Secondary | ICD-10-CM | POA: Diagnosis not present

## 2023-07-17 DIAGNOSIS — E7800 Pure hypercholesterolemia, unspecified: Secondary | ICD-10-CM | POA: Insufficient documentation

## 2023-07-17 DIAGNOSIS — I6523 Occlusion and stenosis of bilateral carotid arteries: Secondary | ICD-10-CM

## 2023-07-17 DIAGNOSIS — I340 Nonrheumatic mitral (valve) insufficiency: Secondary | ICD-10-CM

## 2023-07-17 DIAGNOSIS — E785 Hyperlipidemia, unspecified: Secondary | ICD-10-CM | POA: Insufficient documentation

## 2023-07-17 NOTE — Assessment & Plan Note (Signed)
 Recent 2D echo performed 07/10/2023 showed moderate to severe MR with normal LV systolic function and severe left atrial enlargement.  Patient is entirely asymptomatic.  Will continue to follow her on an annual basis.  Her LV diastolic dimensions are 5.1 cm.

## 2023-07-17 NOTE — Patient Instructions (Signed)
 Medication Instructions:  Your physician recommends that you continue on your current medications as directed. Please refer to the Current Medication list given to you today.  *If you need a refill on your cardiac medications before your next appointment, please call your pharmacy*   Testing/Procedures: Your physician has requested that you have an echocardiogram. Echocardiography is a painless test that uses sound waves to create images of your heart. It provides your doctor with information about the size and shape of your heart and how well your heart's chambers and valves are working. This procedure takes approximately one hour. There are no restrictions for this procedure. Please do NOT wear cologne, perfume, aftershave, or lotions (deodorant is allowed). Please arrive 15 minutes prior to your appointment time. **To be done in March 2026**  Please note: We ask at that you not bring children with you during ultrasound (echo/ vascular) testing. Due to room size and safety concerns, children are not allowed in the ultrasound rooms during exams. Our front office staff cannot provide observation of children in our lobby area while testing is being conducted. An adult accompanying a patient to their appointment will only be allowed in the ultrasound room at the discretion of the ultrasound technician under special circumstances. We apologize for any inconvenience.    Follow-Up: At [facility], you and your health needs are our priority.  As part of our continuing mission to provide you with exceptional heart care, we have created designated Provider Care Teams.  These Care Teams include your primary Cardiologist (physician) and Advanced Practice Providers (APPs -  Physician Assistants and Nurse Practitioners) who all work together to provide you with the care you need, when you need it.  We recommend signing up for the patient portal called "MyChart".  Sign up information is provided on  this After Visit Summary.  MyChart is used to connect with patients for Virtual Visits (Telemedicine).  Patients are able to view lab/test results, encounter notes, upcoming appointments, etc.  Non-urgent messages can be sent to your provider as well.   To learn more about what you can do with MyChart, go to [email or web address].    Your next appointment:   12 month(s)  Provider:   Nanetta Batty, MD    Other Instructions   1st Floor: - Lobby - Registration  - Pharmacy  - Lab - Cafe  2nd Floor: - PV Lab - Diagnostic Testing (echo, CT, nuclear med)  3rd Floor: - Vacant  4th Floor: - TCTS (cardiothoracic surgery) - AFib Clinic - Structural Heart Clinic - Vascular Surgery  - Vascular Ultrasound  5th Floor: - [facility] (general and EP) - Clinical Pharmacy for coumadin, hypertension, lipid, weight-loss medications, and med management appointments    Valet parking services will be available as well.

## 2023-07-17 NOTE — Assessment & Plan Note (Signed)
 Mild hyperlipidemia with a lipid profile performed 05/20/2023 revealing total cholesterol 226, LDL 125 and HDL of 90 not on statin therapy.  She does admit to being somewhat inactive and does not wish to start a statin until her hip is been fixed and she is back exercising and having her lipid profile rechecked.

## 2023-07-17 NOTE — Assessment & Plan Note (Signed)
 Patient needs a elective right total hip replacement by Dr. Magnus Ivan.  Her coronary calcium score was 4 and her echo revealed normal LV systolic function with moderate to severe MR although she is asymptomatic.  I believe she is at low risk for her surgical procedure from a cardiac point of view.

## 2023-07-17 NOTE — Progress Notes (Signed)
 07/17/2023 Jaime Rose   20-Feb-1951  [id number]  Primary Physician Pincus Sanes, MD Primary Cardiologist: Runell Gess MD Nicholes Calamity, [place]  HPI:  Jaime Rose is a 73 y.o. mildly overweight single Caucasian female mother of 2 children who works at [employer].  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  She was referred for preoperative clearance before elective right total hip replacement by Dr. Magnus Ivan.  Her risk factors include mild hyperlipidemia untreated, family history of heart disease with both parents who have had ischemic heart disease.  She is never had a heart attack or stroke.  She denies chest pain or shortness of breath.  She was fairly active before her hip became problematic walking walked her dog several times a day without limitation.  A coronary calcium score was 4 and a 2D echo revealed normal LV systolic function with moderate to severe MR and severe left atrial lodgment.   Current Meds  Medication Sig   levothyroxine (SYNTHROID) 112 MCG tablet Take 1 tablet (112 mcg) by mouth daily before breakfast 6 days a week and 2 tablets (224 mcg ) by mouth one day a week.   loratadine (CLARITIN) 10 MG tablet Take 10 mg by mouth as needed.    naproxen sodium (ALEVE) 220 MG tablet Take 220 mg by mouth.   RESTASIS 0.05 % ophthalmic emulsion      Allergies  Allergen Reactions   Prednisone Other (See Comments)    Very agitated and near psychotic per patient-this was several years ago-not exactly sure if was prednisone or different steroid   Clobetasol Propionate Other (See Comments)    anxious    Social History   Socioeconomic History   Marital status: Single    Spouse name: Not on file   Number of children: Not on file   Years of education: Not on file   Highest education level: Master's degree (e.g., MA, MS, MEng, MEd, MSW, MBA)  Occupational History   Not on file  Tobacco Use   Smoking status:  Former    Current packs/day: 0.00    Types: Cigarettes    Quit date: 04/30/1988    Years since quitting: 35.2   Smokeless tobacco: Never  Vaping Use   Vaping status: Never Used  Substance and Sexual Activity   Alcohol use: Yes    Alcohol/week: 1.0 - 2.0 standard drink of alcohol    Types: 1 Glasses of wine per week    Comment: 1-2 glasses a week   Drug use: No   Sexual activity: Not Currently    Birth control/protection: Abstinence  Other Topics Concern   Not on file  Social History Narrative   Single, lives with 2 dtrs- Victory Dakin & Tresa Endo      Exercising regularly - walking dogs   Social Drivers of Health   Financial Resource Strain: Low Risk  (05/23/2023)   Overall Financial Resource Strain (CARDIA)    Difficulty of Paying Living Expenses: Not hard at all  Food Insecurity: No Food Insecurity (05/23/2023)   Hunger Vital Sign    Worried About Running Out of Food in the Last Year: Never true    Ran Out of Food in the Last Year: Never true  Transportation Needs: No Transportation Needs (05/23/2023)   PRAPARE - Administrator, Civil Service (Medical): No    Lack of Transportation (Non-Medical): No  Physical Activity: Sufficiently Active (05/23/2023)   Exercise Vital Sign  Days of Exercise per Week: 7 days    Minutes of Exercise per Session: 40 min  Stress: No Stress Concern Present (05/23/2023)   [employer] of Occupational Health - Occupational Stress Questionnaire    Feeling of Stress : Not at all  Social Connections: Moderately Integrated (05/23/2023)   Social Connection and Isolation Panel [NHANES]    Frequency of Communication with Friends and Family: More than three times a week    Frequency of Social Gatherings with Friends and Family: Once a week    Attends Religious Services: More than 4 times per year    Active Member of [employer] or Organizations: Yes    Attends Banker Meetings: More than 4 times per year    Marital Status: Never married   Intimate Partner Violence: Patient Unable To Answer (12/14/2022)   Humiliation, Afraid, Rape, and Kick questionnaire    Fear of Current or Ex-Partner: Patient unable to answer    Emotionally Abused: Patient unable to answer    Physically Abused: Patient unable to answer    Sexually Abused: Patient unable to answer     Review of Systems: General: negative for chills, fever, night sweats or weight changes.  Cardiovascular: negative for chest pain, dyspnea on exertion, edema, orthopnea, palpitations, paroxysmal nocturnal dyspnea or shortness of breath Dermatological: negative for rash Respiratory: negative for cough or wheezing Urologic: negative for hematuria Abdominal: negative for nausea, vomiting, diarrhea, bright red blood per rectum, melena, or hematemesis Neurologic: negative for visual changes, syncope, or dizziness All other systems reviewed and are otherwise negative except as noted above.    Blood pressure (!) 166/70, pulse 67, height 5\' 5"  (1.651 m), weight 163 lb (73.9 kg), SpO2 95%.  General appearance: alert and no distress Neck: no adenopathy, no carotid bruit, no JVD, supple, symmetrical, trachea midline, and thyroid not enlarged, symmetric, no tenderness/mass/nodules Lungs: clear to auscultation bilaterally Heart: Soft outflow tract murmur Extremities: extremities normal, atraumatic, no cyanosis or edema Pulses: 2+ and symmetric Skin: Skin color, texture, turgor normal. No rashes or lesions Neurologic: Grossly normal  EKG EKG Interpretation Date/Time:  Wednesday July 17 2023 14:34:24 EDT Ventricular Rate:  67 PR Interval:  216 QRS Duration:  84 QT Interval:  408 QTC Calculation: 431 R Axis:   33  Text Interpretation: Sinus rhythm with 1st degree A-V block Possible Left atrial enlargement No previous ECGs available Confirmed by Nanetta Batty [phone]) on 07/17/2023 2:38:47 PM    ASSESSMENT AND PLAN:   Mitral regurgitation, moderate-possibly severe Recent 2D  echo performed 07/10/2023 showed moderate to severe MR with normal LV systolic function and severe left atrial enlargement.  Patient is entirely asymptomatic.  Will continue to follow her on an annual basis.  Her LV diastolic dimensions are 5.1 cm.  Hyperlipidemia Mild hyperlipidemia with a lipid profile performed 05/20/2023 revealing total cholesterol 226, LDL 125 and HDL of 90 not on statin therapy.  She does admit to being somewhat inactive and does not wish to start a statin until her hip is been fixed and she is back exercising and having her lipid profile rechecked.  Elevated coronary artery calcium score Coronary calcium score performed 06/14/2023 was 4.1 with a small med of calcium in the LAD and RCA.  She is completely asymptomatic.  Preoperative clearance Patient needs a elective right total hip replacement by Dr. Magnus Ivan.  Her coronary calcium score was 4 and her echo revealed normal LV systolic function with moderate to severe MR although she is asymptomatic.  I  believe she is at low risk for her surgical procedure from a cardiac point of view.     Runell Gess MD FACP,FACC,FAHA, [facility] 07/17/2023 2:56 PM

## 2023-07-17 NOTE — Assessment & Plan Note (Signed)
 Coronary calcium score performed 06/14/2023 was 4.1 with a small med of calcium in the LAD and RCA.  She is completely asymptomatic.

## 2023-07-18 NOTE — Progress Notes (Addendum)
 PCP - Cheryll Cockayne, MD LOV 05-24-23 epic Cardiologist - Nanetta Batty, MD  clearance 07-17-23 epic  PPM/ICD -  Device Orders -  Rep Notified -   Chest x-ray -  EKG - 07-17-23  epic Stress Test -  ECHO - 07-10-23 epic Cardiac Cath -  CT cardiac- 06-29-23 epic HGbA1c- 05-24-23 epic  Sleep Study -  CPAP -   Fasting Blood Sugar -  Checks Blood Sugar _____ times a day  Blood Thinner Instructions: Aspirin Instructions:  ERAS Protcol - PRE-SURGERY  G2-    COVID vaccine -yes  Activity--Able to complete ADL's without CP or SOB  Anesthesia review: moderate  to severe MR per echo Asymptomatic, pre DM   Patient denies shortness of breath, fever, cough and chest pain at PAT appointment   All instructions explained to the patient, with a verbal understanding of the material. Patient agrees to go over the instructions while at home for a better understanding. Patient also instructed to self quarantine after being tested for COVID-19. The opportunity to ask questions was provided.

## 2023-07-18 NOTE — Patient Instructions (Signed)
 SURGICAL WAITING ROOM VISITATION  Patients having surgery or a procedure may have no more than 2 support people in the waiting area - these visitors may rotate.    Children under the age of 31 must have an adult with them who is not the patient.  Due to an increase in RSV and influenza rates and associated hospitalizations, children ages 63 and under may not visit patients in [facility] hospitals.  Visitors with respiratory illnesses are discouraged from visiting and should remain at home.  If the patient needs to stay at the hospital during part of their recovery, the visitor guidelines for inpatient rooms apply. Pre-op nurse will coordinate an appropriate time for 1 support person to accompany patient in pre-op.  This support person may not rotate.    Please refer to the [facility] website for the visitor guidelines for Inpatients (after your surgery is over and you are in a regular room).       Your procedure is scheduled on: 07-26-23   Report to [facility] Main Entrance    Report to admitting at      10:15 AM   Call this number if you have problems the morning of surgery [phone]   Do not eat food :After Midnight.   After Midnight you may have the following liquids until _0945_____ AM/ DAY OF SURGERY   then nothing by mouth  Water Non-Citrus Juices (without pulp, NO RED-Apple, White grape, White cranberry) Black Coffee (NO MILK/CREAM OR CREAMERS, sugar ok)  Clear Tea (NO MILK/CREAM OR CREAMERS, sugar ok) regular and decaf                             Plain Jell-O (NO RED)                                           Fruit ices (not with fruit pulp, NO RED)                                     Popsicles (NO RED)                                                               Sports drinks like Gatorade (NO RED)              .        The day of surgery:  Drink ONE (1) Pre-Surgery G2 BY 0945  AM the morning of surgery. Drink in one sitting. Do not sip.  This drink  was given to you during your hospital  pre-op appointment visit. Nothing else to drink after completing the  Pre-Surgery G2.          If you have questions, please contact your surgeon's office.   FOLLOW ANY ADDITIONAL PRE OP INSTRUCTIONS YOU RECEIVED FROM YOUR SURGEON'S OFFICE!!!     Oral Hygiene is also important to reduce your risk of infection.  Remember - BRUSH YOUR TEETH THE MORNING OF SURGERY WITH YOUR REGULAR TOOTHPASTE  DENTURES WILL BE REMOVED PRIOR TO SURGERY PLEASE DO NOT APPLY "Poly grip" OR ADHESIVES!!!   Do NOT smoke after Midnight   Stop all vitamins and herbal supplements 7 days before surgery.   Take these medicines the morning of surgery with A SIP OF WATER: levothyroxine, loratadine, eye drops as usual                                  You may not have any metal on your body including hair pins, jewelry, and body piercing             Do not wear make-up, lotions, powders, perfumes/cologne, or deodorant  Do not wear nail polish including gel and S&S, artificial/acrylic nails, or any other type of covering on natural nails including finger and toenails. If you have artificial nails, gel coating, etc. that needs to be removed by a nail salon please have this removed prior to surgery or surgery may need to be canceled/ delayed if the surgeon/ anesthesia feels like they are unable to be safely monitored.   Do not shave  5 days prior to surgery.           Do not bring valuables to the hospital. [facility] IS NOT             RESPONSIBLE   FOR VALUABLES.   Contacts, glasses, dentures or bridgework may not be worn into surgery.   Bring small overnight bag day of surgery.   DO NOT BRING YOUR HOME MEDICATIONS TO THE HOSPITAL. PHARMACY WILL DISPENSE MEDICATIONS LISTED ON YOUR MEDICATION LIST TO YOU DURING YOUR ADMISSION IN THE HOSPITAL!    Patients discharged on the day of surgery will not be allowed to drive home.  Someone NEEDS  to stay with you for the first 24 hours after anesthesia.   Special Instructions: Bring a copy of your healthcare power of attorney and living will documents the day of surgery if you haven't scanned them before.              Please read over the following fact sheets you were given: IF YOU HAVE QUESTIONS ABOUT YOUR PRE-OP INSTRUCTIONS PLEASE CALL [phone]    If you test positive for Covid or have been in contact with anyone that has tested positive in the last 10 days please notify you surgeon.      Pre-operative 5 CHG Bath Instructions   You can play a key role in reducing the risk of infection after surgery. Your skin needs to be as free of germs as possible. You can reduce the number of germs on your skin by washing with CHG (chlorhexidine gluconate) soap before surgery. CHG is an antiseptic soap that kills germs and continues to kill germs even after washing.   DO NOT use if you have an allergy to chlorhexidine/CHG or antibacterial soaps. If your skin becomes reddened or irritated, stop using the CHG and notify one of our RNs at [phone].   Please shower with the CHG soap starting 4 days before surgery using the following schedule:     Please keep in mind the following:  DO NOT shave, including legs and underarms, starting the day of your first shower.   You may shave your face at any point before/day of surgery.  Place clean sheets on your bed the day you  start using CHG soap. Use a clean washcloth (not used since being washed) for each shower. DO NOT sleep with pets once you start using the CHG.   CHG Shower Instructions:  If you choose to wash your hair and private area, wash first with your normal shampoo/soap.  After you use shampoo/soap, rinse your hair and body thoroughly to remove shampoo/soap residue.  Turn the water OFF and apply about 3 tablespoons (45 ml) of CHG soap to a CLEAN washcloth.  Apply CHG soap ONLY FROM YOUR NECK DOWN TO YOUR TOES (washing for 3-5  minutes)  DO NOT use CHG soap on face, private areas, open wounds, or sores.  Pay special attention to the area where your surgery is being performed.  If you are having back surgery, having someone wash your back for you may be helpful. Wait 2 minutes after CHG soap is applied, then you may rinse off the CHG soap.  Pat dry with a clean towel  Put on clean clothes/pajamas   If you choose to wear lotion, please use ONLY the CHG-compatible lotions on the back of this paper.     Additional instructions for the day of surgery: DO NOT APPLY any lotions, deodorants, cologne, or perfumes.   Put on clean/comfortable clothes.  Brush your teeth.  Ask your nurse before applying any prescription medications to the skin.      CHG Compatible Lotions   Aveeno Moisturizing lotion  Cetaphil Moisturizing Cream  Cetaphil Moisturizing Lotion  Clairol Herbal Essence Moisturizing Lotion, Dry Skin  Clairol Herbal Essence Moisturizing Lotion, Extra Dry Skin  Clairol Herbal Essence Moisturizing Lotion, Normal Skin  Curel Age Defying Therapeutic Moisturizing Lotion with Alpha Hydroxy  Curel Extreme Care Body Lotion  Curel Soothing Hands Moisturizing Hand Lotion  Curel Therapeutic Moisturizing Cream, Fragrance-Free  Curel Therapeutic Moisturizing Lotion, Fragrance-Free  Curel Therapeutic Moisturizing Lotion, Original Formula  Eucerin Daily Replenishing Lotion  Eucerin Dry Skin Therapy Plus Alpha Hydroxy Crme  Eucerin Dry Skin Therapy Plus Alpha Hydroxy Lotion  Eucerin Original Crme  Eucerin Original Lotion  Eucerin Plus Crme Eucerin Plus Lotion  Eucerin TriLipid Replenishing Lotion  Keri Anti-Bacterial Hand Lotion  Keri Deep Conditioning Original Lotion Dry Skin Formula Softly Scented  Keri Deep Conditioning Original Lotion, Fragrance Free Sensitive Skin Formula  Keri Lotion Fast Absorbing Fragrance Free Sensitive Skin Formula  Keri Lotion Fast Absorbing Softly Scented Dry Skin Formula  Keri  Original Lotion  Keri Skin Renewal Lotion Keri Silky Smooth Lotion  Keri Silky Smooth Sensitive Skin Lotion  Nivea Body Creamy Conditioning Oil  Nivea Body Extra Enriched Teacher, adult education Moisturizing Lotion Nivea Crme  Nivea Skin Firming Lotion  NutraDerm 30 Skin Lotion  NutraDerm Skin Lotion  NutraDerm Therapeutic Skin Cream  NutraDerm Therapeutic Skin Lotion  ProShield Protective Hand Cream WHAT IS A BLOOD TRANSFUSION? Blood Transfusion Information  A transfusion is the replacement of blood or some of its parts. Blood is made up of multiple cells which provide different functions. Red blood cells carry oxygen and are used for blood loss replacement. White blood cells fight against infection. Platelets control bleeding. Plasma helps clot blood. Other blood products are available for specialized needs, such as hemophilia or other clotting disorders. BEFORE THE TRANSFUSION  Who gives blood for transfusions?  Healthy volunteers who are fully evaluated to make sure their blood is safe. This is blood bank blood. Transfusion therapy is the safest it has ever been in the practice of  medicine. Before blood is taken from a donor, a complete history is taken to make sure that person has no history of diseases nor engages in risky social behavior (examples are intravenous drug use or sexual activity with multiple partners). The donor's travel history is screened to minimize risk of transmitting infections, such as malaria. The donated blood is tested for signs of infectious diseases, such as HIV and hepatitis. The blood is then tested to be sure it is compatible with you in order to minimize the chance of a transfusion reaction. If you or a relative donates blood, this is often done in anticipation of surgery and is not appropriate for emergency situations. It takes many days to process the donated blood. RISKS AND COMPLICATIONS Although transfusion therapy is  very safe and saves many lives, the main dangers of transfusion include:  Getting an infectious disease. Developing a transfusion reaction. This is an allergic reaction to something in the blood you were given. Every precaution is taken to prevent this. The decision to have a blood transfusion has been considered carefully by your caregiver before blood is given. Blood is not given unless the benefits outweigh the risks. AFTER THE TRANSFUSION Right after receiving a blood transfusion, you will usually feel much better and more energetic. This is especially true if your red blood cells have gotten low (anemic). The transfusion raises the level of the red blood cells which carry oxygen, and this usually causes an energy increase. The nurse administering the transfusion will monitor you carefully for complications. HOME CARE INSTRUCTIONS  No special instructions are needed after a transfusion. You may find your energy is better. Speak with your caregiver about any limitations on activity for underlying diseases you may have. SEEK MEDICAL CARE IF:  Your condition is not improving after your transfusion. You develop redness or irritation at the intravenous (IV) site. SEEK IMMEDIATE MEDICAL CARE IF:  Any of the following symptoms occur over the next 12 hours: Shaking chills. You have a temperature by mouth above 102 F (38.9 C), not controlled by medicine. Chest, back, or muscle pain. People around you feel you are not acting correctly or are confused. Shortness of breath or difficulty breathing. Dizziness and fainting. You get a rash or develop hives. You have a decrease in urine output. Your urine turns a dark color or changes to pink, red, or brown. Any of the following symptoms occur over the next 10 days: You have a temperature by mouth above 102 F (38.9 C), not controlled by medicine. Shortness of breath. Weakness after normal activity. The white part of the eye turns yellow  (jaundice). You have a decrease in the amount of urine or are urinating less often. Your urine turns a dark color or changes to pink, red, or brown. Document Released: 04/13/2000 Document Revised: 07/09/2011 Document Reviewed: 12/01/2007 [employer] Patient Information 2014 [place], [place].  _______________________________________________________________________  Incentive Spirometer  An incentive spirometer is a tool that can help keep your lungs clear and active. This tool measures how well you are filling your lungs with each breath. Taking long deep breaths may help reverse or decrease the chance of developing breathing (pulmonary) problems (especially infection) following: A long period of time when you are unable to move or be active. BEFORE THE PROCEDURE  If the spirometer includes an indicator to show your best effort, your nurse or respiratory therapist will set it to a desired goal. If possible, sit up straight or lean slightly forward. Try not to slouch. Hold  the incentive spirometer in an upright position. INSTRUCTIONS FOR USE  Sit on the edge of your bed if possible, or sit up as far as you can in bed or on a chair. Hold the incentive spirometer in an upright position. Breathe out normally. Place the mouthpiece in your mouth and seal your lips tightly around it. Breathe in slowly and as deeply as possible, raising the piston or the ball toward the top of the column. Hold your breath for 3-5 seconds or for as long as possible. Allow the piston or ball to fall to the bottom of the column. Remove the mouthpiece from your mouth and breathe out normally. Rest for a few seconds and repeat Steps 1 through 7 at least 10 times every 1-2 hours when you are awake. Take your time and take a few normal breaths between deep breaths. The spirometer may include an indicator to show your best effort. Use the indicator as a goal to work toward during each repetition. After each set of 10 deep  breaths, practice coughing to be sure your lungs are clear. If you have an incision (the cut made at the time of surgery), support your incision when coughing by placing a pillow or rolled up towels firmly against it. Once you are able to get out of bed, walk around indoors and cough well. You may stop using the incentive spirometer when instructed by your caregiver.  RISKS AND COMPLICATIONS Take your time so you do not get dizzy or light-headed. If you are in pain, you may need to take or ask for pain medication before doing incentive spirometry. It is harder to take a deep breath if you are having pain. AFTER USE Rest and breathe slowly and easily. It can be helpful to keep track of a log of your progress. Your caregiver can provide you with a simple table to help with this. If you are using the spirometer at home, follow these instructions: SEEK MEDICAL CARE IF:  You are having difficultly using the spirometer. You have trouble using the spirometer as often as instructed. Your pain medication is not giving enough relief while using the spirometer. You develop fever of 100.5 F (38.1 C) or higher. SEEK IMMEDIATE MEDICAL CARE IF:  You cough up bloody sputum that had not been present before. You develop fever of 102 F (38.9 C) or greater. You develop worsening pain at or near the incision site. MAKE SURE YOU:  Understand these instructions. Will watch your condition. Will get help right away if you are not doing well or get worse. Document Released: 08/27/2006 Document Revised: 07/09/2011 Document Reviewed: 10/28/2006 [facility] Patient Information 2014 [place], [place].   ________________________________________________________________________

## 2023-07-22 ENCOUNTER — Other Ambulatory Visit: Payer: Self-pay

## 2023-07-22 ENCOUNTER — Encounter (HOSPITAL_COMMUNITY)
Admission: RE | Admit: 2023-07-22 | Discharge: 2023-07-22 | Disposition: A | Discharge status: Home / Self Care | Source: Ambulatory Visit | Attending: Orthopaedic Surgery | Admitting: Orthopaedic Surgery

## 2023-07-22 ENCOUNTER — Encounter (HOSPITAL_COMMUNITY): Payer: Self-pay

## 2023-07-22 VITALS — BP 175/73 | HR 67 | Temp 98.3°F | Resp 16 | Ht 65.0 in | Wt 159.0 lb

## 2023-07-22 DIAGNOSIS — E039 Hypothyroidism, unspecified: Secondary | ICD-10-CM | POA: Insufficient documentation

## 2023-07-22 DIAGNOSIS — I340 Nonrheumatic mitral (valve) insufficiency: Secondary | ICD-10-CM | POA: Insufficient documentation

## 2023-07-22 DIAGNOSIS — R7303 Prediabetes: Secondary | ICD-10-CM | POA: Diagnosis not present

## 2023-07-22 DIAGNOSIS — Z01818 Encounter for other preprocedural examination: Secondary | ICD-10-CM

## 2023-07-22 DIAGNOSIS — Z87891 Personal history of nicotine dependence: Secondary | ICD-10-CM | POA: Diagnosis not present

## 2023-07-22 DIAGNOSIS — Z01812 Encounter for preprocedural laboratory examination: Secondary | ICD-10-CM | POA: Diagnosis not present

## 2023-07-22 DIAGNOSIS — M1611 Unilateral primary osteoarthritis, right hip: Secondary | ICD-10-CM | POA: Diagnosis not present

## 2023-07-22 HISTORY — DX: Cardiac murmur, unspecified: R01.1

## 2023-07-22 HISTORY — DX: Unspecified osteoarthritis, unspecified site: M19.90

## 2023-07-22 HISTORY — DX: Prediabetes: R73.03

## 2023-07-22 LAB — CBC
HCT: 38.9 % (ref 36.0–46.0)
Hemoglobin: 12.4 g/dL (ref 12.0–15.0)
MCH: 29.2 pg (ref 26.0–34.0)
MCHC: 31.9 g/dL (ref 30.0–36.0)
MCV: 91.7 fL (ref 80.0–100.0)
Platelets: 332 10*3/uL (ref 150–400)
RBC: 4.24 MIL/uL (ref 3.87–5.11)
RDW: 13.3 % (ref 11.5–15.5)
WBC: 8 10*3/uL (ref 4.0–10.5)
nRBC: 0 % (ref 0.0–0.2)

## 2023-07-22 LAB — BASIC METABOLIC PANEL
Anion gap: 10 (ref 5–15)
BUN: 22 mg/dL (ref 8–23)
CO2: 25 mmol/L (ref 22–32)
Calcium: 9.7 mg/dL (ref 8.9–10.3)
Chloride: 106 mmol/L (ref 98–111)
Creatinine, Ser: 0.68 mg/dL (ref 0.44–1.00)
GFR, Estimated: >60 mL/min (ref >60)
Glucose, Bld: 78 mg/dL (ref 70–99)
Potassium: 4.2 mmol/L (ref 3.5–5.1)
Sodium: 141 mmol/L (ref 135–145)

## 2023-07-22 LAB — SURGICAL PCR SCREEN
MRSA, PCR: NEGATIVE
Staphylococcus aureus: NEGATIVE

## 2023-07-23 ENCOUNTER — Telehealth: Payer: Self-pay | Admitting: *Deleted

## 2023-07-23 ENCOUNTER — Encounter (HOSPITAL_COMMUNITY): Payer: Self-pay

## 2023-07-23 NOTE — Anesthesia Preprocedure Evaluation (Addendum)
 Anesthesia Evaluation  Patient identified by MRN, date of birth, ID band Patient awake    Reviewed: Allergy & Precautions, NPO status , Patient's Chart, lab work & pertinent test results  Airway Mallampati: III  TM Distance: >3 FB Neck ROM: Full    Dental  (+) Teeth Intact, Dental Advisory Given   Pulmonary former smoker   Pulmonary exam normal breath sounds clear to auscultation       Cardiovascular Normal cardiovascular exam+ Valvular Problems/Murmurs (mod-severe MR) MR  Rhythm:Regular Rate:Normal  Echo 06/2023  1. At least moderate MR, possibly severe. The LA is severely dilated.  Mechanism is likely related to restricted PMVL. Consider TEE for  definitive evaluation of mechanism and severity of MR. The mitral valve is  grossly normal. Moderate to severe mitral  valve regurgitation. No evidence of mitral stenosis.   2. Left ventricular ejection fraction, by estimation, is 55 to 60%. Left  ventricular ejection fraction by 3D volume is 58 %. The left ventricle has  normal function. The left ventricle has no regional wall motion  abnormalities. Left ventricular diastolic   parameters were normal.   3. Right ventricular systolic function is normal. The right ventricular  size is normal. Tricuspid regurgitation signal is inadequate for assessing  PA pressure.   4. Left atrial size was severely dilated.   5. The aortic valve is tricuspid. Aortic valve regurgitation is not  visualized. Aortic valve sclerosis is present, with no evidence of aortic  valve stenosis.   6. The inferior vena cava is normal in size with greater than 50%  respiratory variability, suggesting right atrial pressure of 3 mmHg.      Neuro/Psych negative neurological ROS  negative psych ROS   GI/Hepatic negative GI ROS, Neg liver ROS,,,  Endo/Other  Hypothyroidism    Renal/GU negative Renal ROS  negative genitourinary   Musculoskeletal  (+)  Arthritis , Osteoarthritis,    Abdominal   Peds  Hematology negative hematology ROS (+) Hb 12.4, plt 332   Anesthesia Other Findings   Reproductive/Obstetrics negative OB ROS                             Anesthesia Physical Anesthesia Plan  ASA: 4  Anesthesia Plan: Spinal and MAC   Post-op Pain Management: Tylenol PO (pre-op)*   Induction:   PONV Risk Score and Plan: 2 and Propofol infusion and TIVA  Airway Management Planned: Natural Airway and Nasal Cannula  Additional Equipment: ClearSight  Intra-op Plan:   Post-operative Plan:   Informed Consent: I have reviewed the patients History and Physical, chart, labs and discussed the procedure including the risks, benefits and alternatives for the proposed anesthesia with the patient or authorized representative who has indicated his/her understanding and acceptance.       Plan Discussed with: CRNA  Anesthesia Plan Comments: (Clearsight vs A line depending on correlation to NIBP)        Anesthesia Quick Evaluation

## 2023-07-23 NOTE — Care Plan (Signed)
 [facility] RNCM call to patient to discuss her upcoming Right total hip arthroplasty with Dr. Magnus Ivan on 07/26/23 at [facility]. She lives alone, but has 2 daughters that will be assisting after surgery. Plan is to return home. She has a shower seat, 4 pronged cane and an ice wrap system sent to her by a vendor Dr. Eliberto Ivory office is now using. She will need a RW. Referral to [facility] done. Anticipate HHPT will be needed after a short hospital stay. Referral made to [facility] after choice provided. Reviewed all post op care instructions. Will continue to follow for needs.

## 2023-07-23 NOTE — Progress Notes (Signed)
 Case: [id number] Date/Time: 07/26/23 1230   Procedure: ARTHROPLASTY, HIP, TOTAL, ANTERIOR APPROACH (Right: Hip)   Anesthesia type: Spinal   Diagnosis: Primary osteoarthritis of right hip [M16.11]   Pre-op diagnosis: Osteoarthritis right hip   Location: [facility] ROOM 09 / [facility]   Surgeons: Kathryne Hitch, MD       DISCUSSION: [facility] is a 73 year old female who presents to PAT prior to surgery above.  Past medical history significant for former smoking, mitral regurgitation, hypothyroidism, prediabetes.  Patient was referred to cardiology for preop clearance.  Seen on 07/17/2023.  She recently had a CT calcium score which came back low and an 2D echo which revealed normal LV systolic function with moderate to severe MR and severe left atrial enlargement.  Patient cleared for surgery due to pain asymptomatic:  "Preoperative clearance Patient needs a elective right total hip replacement by Dr. Magnus Ivan.  Her coronary calcium score was 4 and her echo revealed normal LV systolic function with moderate to severe MR although she is asymptomatic.  I believe she is at low risk for her surgical procedure from a cardiac point of view."   VS: BP (!) 175/73   Pulse 67   Temp 36.8 C (Oral)   Resp 16   Ht 5\' 5"  (1.651 m)   Wt 72.1 kg   SpO2 100%   BMI 26.46 kg/m   PROVIDERS: Pincus Sanes, MD   LABS: Labs reviewed: Acceptable for surgery. (all labs ordered are listed, but only abnormal results are displayed)  Labs Reviewed  SURGICAL PCR SCREEN  CBC  BASIC METABOLIC PANEL  TYPE AND SCREEN     IMAGES:   EKG 07/17/2023: Sinus rhythm with 1st degree A-V block, rate 67 Possible Left atrial enlargement  CV:  Echo 07/10/2023: IMPRESSIONS    1. At least moderate MR, possibly severe. The LA is severely dilated. Mechanism is likely related to restricted PMVL. Consider TEE for definitive evaluation of mechanism and severity of MR. The mitral valve is grossly normal.  Moderate to severe mitral valve regurgitation. No evidence of mitral stenosis.  2. Left ventricular ejection fraction, by estimation, is 55 to 60%. Left ventricular ejection fraction by 3D volume is 58 %. The left ventricle has normal function. The left ventricle has no regional wall motion abnormalities. Left ventricular diastolic  parameters were normal.  3. Right ventricular systolic function is normal. The right ventricular size is normal. Tricuspid regurgitation signal is inadequate for assessing PA pressure.  4. Left atrial size was severely dilated.  5. The aortic valve is tricuspid. Aortic valve regurgitation is not visualized. Aortic valve sclerosis is present, with no evidence of aortic valve stenosis.  6. The inferior vena cava is normal in size with greater than 50% respiratory variability, suggesting right atrial pressure of 3 mmHg.   CT calcium score 06/14/2023:  IMPRESSION: Coronary calcium score of 4.12 Agatston units. This was 25th percentile for age-, race-, and sex-matched controls. Past Medical History:  Diagnosis Date   ALLERGIC RHINITIS    Allergy 1985   seasonal allergies   Arthritis    CHICKENPOX, HX OF 12/21/2009   Qualifier: Diagnosis of  By: Felicity Coyer MD, Raenette Rover    Heart murmur    MR   Hyperthyroidism    normal TFTs 11/2009 , dx 10/2011   Pre-diabetes    Varicose vein    s/p sclerosis tx summer 2013    Past Surgical History:  Procedure Laterality Date   ENDOVENOUS ABLATION SAPHENOUS VEIN W/  LASER  04/11/2012   endovenous laser ablation right greater saphenous vein and stab phlebectomy  right leg  by Gretta Began MD   NO PAST SURGERIES      MEDICATIONS:  Calcium-Vitamin D-Vitamin K (VIACTIV CALCIUM PLUS D) 650-12.5-40 MG-MCG-MCG CHEW   cholecalciferol (VITAMIN D3) 25 MCG (1000 UNIT) tablet   levothyroxine (SYNTHROID) 112 MCG tablet   loratadine (CLARITIN) 10 MG tablet   RESTASIS 0.05 % ophthalmic emulsion   No current facility-administered  medications for this encounter.   Marcille Blanco [facility] Surgical Short Stay/Anesthesiology [facility] Phone [phone] 07/23/2023 10:32 AM

## 2023-07-23 NOTE — Telephone Encounter (Signed)
 [facility] pre-op call completed.

## 2023-07-23 NOTE — Progress Notes (Signed)
 Pt. Called regarding concerns with G2 drink having sucralose in it because artificial sweeteners give pt diarrhea. Pt. Is pre Diabetic and stated her A1C has stayed steady 5.7/ 5.8 for years. I advised her to drink any of the other clear liquids listed on her instructions including regular Gatorade NO RED. And to make sure she completed clear liquids by instructed time of 0945 am.Pt. VU

## 2023-07-25 NOTE — H&P (Signed)
 TOTAL HIP ADMISSION H&P  Patient is admitted for right total hip arthroplasty.  Subjective:  Chief Complaint: right hip pain  HPI: Jaime Rose, 73 y.o. female, has a history of pain and functional disability in the right hip(s) due to arthritis and patient has failed non-surgical conservative treatments for greater than 12 weeks to include NSAID's and/or analgesics, corticosteriod injections, and activity modification.  Onset of symptoms was gradual starting several years ago with gradually worsening course since that time.The patient noted no past surgery on the right hip(s).  Patient currently rates pain in the right hip at 10 out of 10 with activity. Patient has night pain, worsening of pain with activity and weight bearing, trendelenberg gait, pain that interfers with activities of daily living, and pain with passive range of motion. Patient has evidence of subchondral cysts, subchondral sclerosis, periarticular osteophytes, and joint space narrowing by imaging studies. This condition presents safety issues increasing the risk of falls.  There is no current active infection.  Patient Active Problem List   Diagnosis Date Noted   Hyperlipidemia 07/17/2023   Elevated coronary artery calcium score 07/17/2023   Preoperative clearance 07/17/2023   Mitral regurgitation, moderate-possibly severe 07/11/2023   Unilateral primary osteoarthritis, right hip 06/10/2023   Vitamin D deficiency 05/23/2023   Foot pain, left 06/02/2021   Ptosis of right eyelid 04/12/2020   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 12/29/2018   Osteopenia 01/10/2017   Carotid artery stenosis, asymptomatic, bilateral 12/21/2016   Prediabetes 12/21/2016   Hypothyroidism 03/20/2012   Varicose veins of bilateral lower extremities with other complications 12/21/2011   Allergic rhinitis 12/21/2009   Cardiac murmur 12/21/2009   Past Medical History:  Diagnosis Date   ALLERGIC RHINITIS    Allergy 1985   seasonal allergies    Arthritis    CHICKENPOX, HX OF 12/21/2009   Qualifier: Diagnosis of  By: Felicity Coyer MD, Raenette Rover    Heart murmur    MR   Hyperthyroidism    normal TFTs 11/2009 , dx 10/2011   Pre-diabetes    Varicose vein    s/p sclerosis tx summer 2013    Past Surgical History:  Procedure Laterality Date   ENDOVENOUS ABLATION SAPHENOUS VEIN W/ LASER  04/11/2012   endovenous laser ablation right greater saphenous vein and stab phlebectomy  right leg  by Gretta Began MD   NO PAST SURGERIES      No current facility-administered medications for this encounter.   Current Outpatient Medications  Medication Sig Dispense Refill Last Dose/Taking   Calcium-Vitamin D-Vitamin K (VIACTIV CALCIUM PLUS D) 650-12.5-40 MG-MCG-MCG CHEW Chew 1 Piece by mouth daily.   Taking   cholecalciferol (VITAMIN D3) 25 MCG (1000 UNIT) tablet Take 1,000 Units by mouth daily.   Taking   levothyroxine (SYNTHROID) 112 MCG tablet Take 1 tablet (112 mcg) by mouth daily before breakfast 6 days a week and 2 tablets (224 mcg ) by mouth one day a week. 104 tablet 0 Taking   loratadine (CLARITIN) 10 MG tablet Take 10 mg by mouth daily.   Taking   RESTASIS 0.05 % ophthalmic emulsion Place 1 drop into both eyes 2 (two) times daily.   Taking   Allergies  Allergen Reactions   Prednisone Other (See Comments)    Very agitated and near psychotic per patient-this was several years ago-not exactly sure if was prednisone or different steroid ALL STEROIDS   Orange Fruit [Citrus] Hives    Juice       Clobetasol Propionate Other (See  Comments)    anxious    Social History   Tobacco Use   Smoking status: Former    Current packs/day: 0.00    Types: Cigarettes    Quit date: 04/30/1988    Years since quitting: 35.2   Smokeless tobacco: Never  Substance Use Topics   Alcohol use: Yes    Alcohol/week: 1.0 - 2.0 standard drink of alcohol    Types: 1 Glasses of wine per week    Comment: 1-2 glasses a month    Family History  Problem Relation Age of  Onset   Heart disease Father    Autoimmune disease Father    Hypertension Father    Other Father        varicose veins, AAA   Stroke Father    Depression Father    Varicose Veins Father    Hyperlipidemia Mother    Other Mother        varicose veins, AAA   Stroke Mother    Heart attack Mother 60   Heart disease Mother    Miscarriages / [place] Mother    Varicose Veins Mother    Stroke Brother    Hyperlipidemia Brother    Hyperlipidemia Brother    Stroke Brother    Hyperlipidemia Sister    Diabetes Maternal Grandfather    Cancer Maternal Grandfather    Diabetes Paternal Grandfather    Breast cancer Maternal Aunt    Cancer Maternal Aunt      Review of Systems  Objective:  Physical Exam Vitals reviewed.  Constitutional:      Appearance: Normal appearance. She is normal weight.  HENT:     Head: Normocephalic and atraumatic.  Eyes:     Extraocular Movements: Extraocular movements intact.     Pupils: Pupils are equal, round, and reactive to light.  Cardiovascular:     Rate and Rhythm: Normal rate and regular rhythm.     Pulses: Normal pulses.  Pulmonary:     Effort: Pulmonary effort is normal.     Breath sounds: Normal breath sounds.  Abdominal:     Palpations: Abdomen is soft.  Musculoskeletal:     Cervical back: Normal range of motion and neck supple.     Right hip: Tenderness and bony tenderness present. Decreased range of motion. Decreased strength.  Neurological:     Mental Status: She is alert and oriented to person, place, and time.  Psychiatric:        Behavior: Behavior normal.     Vital signs in last 24 hours:    Labs:   Estimated body mass index is 26.46 kg/m as calculated from the following:   Height as of 07/22/23: 5\' 5"  (1.651 m).   Weight as of 07/22/23: 72.1 kg.   Imaging Review Plain radiographs demonstrate severe degenerative joint disease of the right hip(s). The bone quality appears to be good for age and reported activity  level.      Assessment/Plan:  End stage arthritis, right hip(s)  The patient history, physical examination, clinical judgement of the provider and imaging studies are consistent with end stage degenerative joint disease of the right hip(s) and total hip arthroplasty is deemed medically necessary. The treatment options including medical management, injection therapy, arthroscopy and arthroplasty were discussed at length. The risks and benefits of total hip arthroplasty were presented and reviewed. The risks due to aseptic loosening, infection, stiffness, dislocation/subluxation,  thromboembolic complications and other imponderables were discussed.  The patient acknowledged the explanation, agreed to proceed with  the plan and consent was signed. Patient is being admitted for inpatient treatment for surgery, pain control, PT, OT, prophylactic antibiotics, VTE prophylaxis, progressive ambulation and ADL's and discharge planning.The patient is planning to be discharged home with home health services

## 2023-07-26 ENCOUNTER — Ambulatory Visit (HOSPITAL_COMMUNITY): Payer: Self-pay | Admitting: Medical

## 2023-07-26 ENCOUNTER — Other Ambulatory Visit: Payer: Self-pay

## 2023-07-26 ENCOUNTER — Ambulatory Visit (HOSPITAL_BASED_OUTPATIENT_CLINIC_OR_DEPARTMENT_OTHER): Admitting: Certified Registered Nurse Anesthetist

## 2023-07-26 ENCOUNTER — Encounter (HOSPITAL_COMMUNITY): Payer: Self-pay | Admitting: Orthopaedic Surgery

## 2023-07-26 ENCOUNTER — Observation Stay (HOSPITAL_COMMUNITY)
Admission: RE | Admit: 2023-07-26 | Discharge: 2023-07-28 | Disposition: A | Discharge status: Home Health | Attending: Orthopaedic Surgery | Admitting: Orthopaedic Surgery

## 2023-07-26 ENCOUNTER — Encounter (HOSPITAL_COMMUNITY)
Admission: RE | Disposition: A | Discharge status: Home Health | Payer: Self-pay | Source: Home / Self Care | Attending: Orthopaedic Surgery

## 2023-07-26 ENCOUNTER — Ambulatory Visit (HOSPITAL_COMMUNITY)

## 2023-07-26 ENCOUNTER — Observation Stay (HOSPITAL_COMMUNITY)

## 2023-07-26 DIAGNOSIS — E039 Hypothyroidism, unspecified: Secondary | ICD-10-CM | POA: Diagnosis not present

## 2023-07-26 DIAGNOSIS — M1611 Unilateral primary osteoarthritis, right hip: Principal | ICD-10-CM | POA: Insufficient documentation

## 2023-07-26 DIAGNOSIS — Z87891 Personal history of nicotine dependence: Secondary | ICD-10-CM | POA: Insufficient documentation

## 2023-07-26 DIAGNOSIS — Z96641 Presence of right artificial hip joint: Secondary | ICD-10-CM

## 2023-07-26 DIAGNOSIS — Z79899 Other long term (current) drug therapy: Secondary | ICD-10-CM | POA: Insufficient documentation

## 2023-07-26 DIAGNOSIS — Z471 Aftercare following joint replacement surgery: Secondary | ICD-10-CM | POA: Diagnosis not present

## 2023-07-26 HISTORY — PX: TOTAL HIP ARTHROPLASTY: SHX124

## 2023-07-26 LAB — TYPE AND SCREEN
ABO/RH(D): O POS
Antibody Screen: NEGATIVE

## 2023-07-26 LAB — ABO/RH: ABO/RH(D): O POS

## 2023-07-26 SURGERY — ARTHROPLASTY, HIP, TOTAL, ANTERIOR APPROACH
Anesthesia: Monitor Anesthesia Care | Site: Hip | Laterality: Right

## 2023-07-26 MED ORDER — MENTHOL 3 MG MT LOZG: 1.0000 | LOZENGE | OROMUCOSAL | Status: DC | PRN

## 2023-07-26 MED ORDER — LORATADINE 10 MG PO TABS
10.0000 mg | ORAL_TABLET | Freq: Every day | ORAL | Status: DC
Start: 2023-07-27 — End: 2023-07-28
  Administered 2023-07-27 – 2023-07-28 (×2): 10 mg via ORAL
  Filled 2023-07-26 (×2): qty 1

## 2023-07-26 MED ORDER — LEVOTHYROXINE SODIUM 112 MCG PO TABS
112.0000 ug | ORAL_TABLET | Freq: Every day | ORAL | Status: DC
  Administered 2023-07-27 – 2023-07-28 (×2): 112 ug via ORAL
  Filled 2023-07-26 (×2): qty 1

## 2023-07-26 MED ORDER — ONDANSETRON HCL 4 MG/2ML IJ SOLN
INTRAMUSCULAR | Status: AC
  Filled 2023-07-26: qty 2

## 2023-07-26 MED ORDER — OXYCODONE HCL 5 MG PO TABS
5.0000 mg | ORAL_TABLET | ORAL | Status: DC | PRN
  Administered 2023-07-26 (×2): 5 mg via ORAL
  Administered 2023-07-27 – 2023-07-28 (×4): 10 mg via ORAL
  Filled 2023-07-26 (×5): qty 2
  Filled 2023-07-26: qty 1
  Filled 2023-07-26 (×2): qty 2
  Filled 2023-07-26: qty 1

## 2023-07-26 MED ORDER — ORAL CARE MOUTH RINSE: 15.0000 mL | Freq: Once | OROMUCOSAL | Status: AC

## 2023-07-26 MED ORDER — CEFAZOLIN SODIUM-DEXTROSE 2-4 GM/100ML-% IV SOLN
2.0000 g | Freq: Four times a day (QID) | INTRAVENOUS | Status: AC
  Administered 2023-07-26 – 2023-07-27 (×2): 2 g via INTRAVENOUS
  Filled 2023-07-26 (×2): qty 100

## 2023-07-26 MED ORDER — METHOCARBAMOL 500 MG PO TABS
500.0000 mg | ORAL_TABLET | Freq: Four times a day (QID) | ORAL | Status: DC | PRN
  Administered 2023-07-26 – 2023-07-27 (×3): 500 mg via ORAL
  Filled 2023-07-26 (×4): qty 1

## 2023-07-26 MED ORDER — OXYCODONE HCL 5 MG PO TABS: 5.0000 mg | ORAL_TABLET | Freq: Once | ORAL | Status: DC | PRN

## 2023-07-26 MED ORDER — LACTATED RINGERS IV SOLN: INTRAVENOUS | Status: DC

## 2023-07-26 MED ORDER — ACETAMINOPHEN 500 MG PO TABS
1000.0000 mg | ORAL_TABLET | Freq: Once | ORAL | Status: AC
  Administered 2023-07-26: 1000 mg via ORAL
  Filled 2023-07-26: qty 2

## 2023-07-26 MED ORDER — ASPIRIN 81 MG PO CHEW
81.0000 mg | CHEWABLE_TABLET | Freq: Two times a day (BID) | ORAL | Status: DC
  Administered 2023-07-26 – 2023-07-28 (×4): 81 mg via ORAL
  Filled 2023-07-26 (×4): qty 1

## 2023-07-26 MED ORDER — METOCLOPRAMIDE HCL 5 MG PO TABS: 5.0000 mg | ORAL_TABLET | Freq: Three times a day (TID) | ORAL | Status: DC | PRN

## 2023-07-26 MED ORDER — PHENYLEPHRINE HCL-NACL 20-0.9 MG/250ML-% IV SOLN
INTRAVENOUS | Status: DC | PRN
  Administered 2023-07-26: 35 ug/min via INTRAVENOUS

## 2023-07-26 MED ORDER — SODIUM CHLORIDE 0.9 % IV SOLN: INTRAVENOUS | Status: DC

## 2023-07-26 MED ORDER — MIDAZOLAM HCL 5 MG/5ML IJ SOLN
INTRAMUSCULAR | Status: DC | PRN
  Administered 2023-07-26 (×2): 1 mg via INTRAVENOUS

## 2023-07-26 MED ORDER — OYSTER SHELL CALCIUM/D3 500-5 MG-MCG PO TABS
1.0000 | ORAL_TABLET | Freq: Every day | ORAL | Status: DC
  Administered 2023-07-27 – 2023-07-28 (×2): 1 via ORAL
  Filled 2023-07-26 (×2): qty 1

## 2023-07-26 MED ORDER — LIDOCAINE HCL (PF) 2 % IJ SOLN
INTRAMUSCULAR | Status: DC | PRN
  Administered 2023-07-26: 60 mg via INTRADERMAL

## 2023-07-26 MED ORDER — TRANEXAMIC ACID-NACL 1000-0.7 MG/100ML-% IV SOLN
1000.0000 mg | INTRAVENOUS | Status: AC
  Administered 2023-07-26: 1000 mg via INTRAVENOUS
  Filled 2023-07-26: qty 100

## 2023-07-26 MED ORDER — OXYCODONE HCL 5 MG/5ML PO SOLN: 5.0000 mg | Freq: Once | ORAL | Status: DC | PRN

## 2023-07-26 MED ORDER — MIDAZOLAM HCL 2 MG/2ML IJ SOLN
INTRAMUSCULAR | Status: AC
  Filled 2023-07-26: qty 2

## 2023-07-26 MED ORDER — SODIUM CHLORIDE 0.9 % IR SOLN
Status: DC | PRN
  Administered 2023-07-26: 1000 mL

## 2023-07-26 MED ORDER — ONDANSETRON HCL 4 MG PO TABS: 4.0000 mg | ORAL_TABLET | Freq: Four times a day (QID) | ORAL | Status: DC | PRN

## 2023-07-26 MED ORDER — PHENOL 1.4 % MT LIQD: 1.0000 | OROMUCOSAL | Status: DC | PRN

## 2023-07-26 MED ORDER — CEFAZOLIN SODIUM-DEXTROSE 2-4 GM/100ML-% IV SOLN
2.0000 g | INTRAVENOUS | Status: AC
  Administered 2023-07-26: 2 g via INTRAVENOUS
  Filled 2023-07-26: qty 100

## 2023-07-26 MED ORDER — METOCLOPRAMIDE HCL 5 MG/ML IJ SOLN: 5.0000 mg | Freq: Three times a day (TID) | INTRAMUSCULAR | Status: DC | PRN

## 2023-07-26 MED ORDER — ONDANSETRON HCL 4 MG/2ML IJ SOLN: 4.0000 mg | Freq: Once | INTRAMUSCULAR | Status: DC | PRN

## 2023-07-26 MED ORDER — DIPHENHYDRAMINE HCL 12.5 MG/5ML PO ELIX: 12.5000 mg | ORAL_SOLUTION | ORAL | Status: DC | PRN

## 2023-07-26 MED ORDER — VITAMIN D 25 MCG (1000 UNIT) PO TABS
1000.0000 [IU] | ORAL_TABLET | Freq: Every day | ORAL | Status: DC
  Administered 2023-07-27 – 2023-07-28 (×2): 1000 [IU] via ORAL
  Filled 2023-07-26 (×2): qty 1

## 2023-07-26 MED ORDER — CHLORHEXIDINE GLUCONATE 0.12 % MT SOLN
15.0000 mL | Freq: Once | OROMUCOSAL | Status: AC
  Administered 2023-07-26: 15 mL via OROMUCOSAL

## 2023-07-26 MED ORDER — BUPIVACAINE IN DEXTROSE 0.75-8.25 % IT SOLN
INTRATHECAL | Status: DC | PRN
  Administered 2023-07-26: 1.5 mL via INTRATHECAL

## 2023-07-26 MED ORDER — ACETAMINOPHEN 325 MG PO TABS: 325.0000 mg | ORAL_TABLET | Freq: Four times a day (QID) | ORAL | Status: DC | PRN

## 2023-07-26 MED ORDER — HYDROMORPHONE HCL 1 MG/ML IJ SOLN: 0.5000 mg | INTRAMUSCULAR | Status: DC | PRN

## 2023-07-26 MED ORDER — 0.9 % SODIUM CHLORIDE (POUR BTL) OPTIME
TOPICAL | Status: DC | PRN
  Administered 2023-07-26: 1000 mL

## 2023-07-26 MED ORDER — PROPOFOL 500 MG/50ML IV EMUL
INTRAVENOUS | Status: DC | PRN
Start: 2023-07-26 — End: 2023-07-26
  Administered 2023-07-26: 50 ug/kg/min via INTRAVENOUS

## 2023-07-26 MED ORDER — PROPOFOL 10 MG/ML IV BOLUS
INTRAVENOUS | Status: DC | PRN
  Administered 2023-07-26: 20 mg via INTRAVENOUS

## 2023-07-26 MED ORDER — ALUM & MAG HYDROXIDE-SIMETH 200-200-20 MG/5ML PO SUSP: 30.0000 mL | ORAL | Status: DC | PRN

## 2023-07-26 MED ORDER — PROPOFOL 1000 MG/100ML IV EMUL
INTRAVENOUS | Status: AC
  Filled 2023-07-26: qty 100

## 2023-07-26 MED ORDER — OXYCODONE HCL 5 MG PO TABS
10.0000 mg | ORAL_TABLET | ORAL | Status: DC | PRN
  Administered 2023-07-27 (×3): 10 mg via ORAL

## 2023-07-26 MED ORDER — POVIDONE-IODINE 10 % EX SWAB: 2.0000 | Freq: Once | CUTANEOUS | Status: DC

## 2023-07-26 MED ORDER — CYCLOSPORINE 0.05 % OP EMUL
1.0000 [drp] | Freq: Two times a day (BID) | OPHTHALMIC | Status: DC
  Administered 2023-07-27: 1 [drp] via OPHTHALMIC
  Filled 2023-07-26 (×4): qty 30

## 2023-07-26 MED ORDER — METHOCARBAMOL 1000 MG/10ML IJ SOLN: 500.0000 mg | Freq: Four times a day (QID) | INTRAMUSCULAR | Status: DC | PRN

## 2023-07-26 MED ORDER — PANTOPRAZOLE SODIUM 40 MG PO TBEC
40.0000 mg | DELAYED_RELEASE_TABLET | Freq: Every day | ORAL | Status: DC
  Administered 2023-07-27 – 2023-07-28 (×2): 40 mg via ORAL
  Filled 2023-07-26 (×2): qty 1

## 2023-07-26 MED ORDER — ONDANSETRON HCL 4 MG/2ML IJ SOLN
INTRAMUSCULAR | Status: DC | PRN
  Administered 2023-07-26: 4 mg via INTRAVENOUS

## 2023-07-26 MED ORDER — DOCUSATE SODIUM 100 MG PO CAPS
100.0000 mg | ORAL_CAPSULE | Freq: Two times a day (BID) | ORAL | Status: DC
  Administered 2023-07-26 – 2023-07-28 (×4): 100 mg via ORAL
  Filled 2023-07-26 (×4): qty 1

## 2023-07-26 MED ORDER — FENTANYL CITRATE (PF) 100 MCG/2ML IJ SOLN
INTRAMUSCULAR | Status: DC | PRN
  Administered 2023-07-26 (×2): 50 ug via INTRAVENOUS

## 2023-07-26 MED ORDER — AMISULPRIDE (ANTIEMETIC) 5 MG/2ML IV SOLN: 10.0000 mg | Freq: Once | INTRAVENOUS | Status: DC | PRN

## 2023-07-26 MED ORDER — HYDROMORPHONE HCL 1 MG/ML IJ SOLN
INTRAMUSCULAR | Status: AC
  Filled 2023-07-26: qty 1

## 2023-07-26 MED ORDER — FENTANYL CITRATE (PF) 100 MCG/2ML IJ SOLN
INTRAMUSCULAR | Status: AC
  Filled 2023-07-26: qty 2

## 2023-07-26 MED ORDER — HYDROMORPHONE HCL 1 MG/ML IJ SOLN
0.2500 mg | INTRAMUSCULAR | Status: DC | PRN
  Administered 2023-07-26: 0.5 mg via INTRAVENOUS

## 2023-07-26 MED ORDER — ONDANSETRON HCL 4 MG/2ML IJ SOLN: 4.0000 mg | Freq: Four times a day (QID) | INTRAMUSCULAR | Status: DC | PRN

## 2023-07-26 SURGICAL SUPPLY — 36 items
BAG COUNTER SPONGE SURGICOUNT (BAG) ×1 IMPLANT
BAG ZIPLOCK 12X15 (MISCELLANEOUS) IMPLANT
BENZOIN TINCTURE PRP APPL 2/3 (GAUZE/BANDAGES/DRESSINGS) IMPLANT
BLADE SAW SGTL 18X1.27X75 (BLADE) ×1 IMPLANT
COVER PERINEAL POST (MISCELLANEOUS) ×1 IMPLANT
COVER SURGICAL LIGHT HANDLE (MISCELLANEOUS) ×1 IMPLANT
DRAPE FOOT SWITCH (DRAPES) ×1 IMPLANT
DRAPE STERI IOBAN 125X83 (DRAPES) ×1 IMPLANT
DRAPE U-SHAPE 47X51 STRL (DRAPES) ×2 IMPLANT
DRSG AQUACEL AG ADV 3.5X10 (GAUZE/BANDAGES/DRESSINGS) ×1 IMPLANT
DURAPREP 26ML APPLICATOR (WOUND CARE) ×1 IMPLANT
ELECT REM PT RETURN 15FT ADLT (MISCELLANEOUS) ×1 IMPLANT
GAUZE XEROFORM 1X8 LF (GAUZE/BANDAGES/DRESSINGS) IMPLANT
GLOVE BIO SURGEON STRL SZ7.5 (GLOVE) ×1 IMPLANT
GLOVE BIOGEL PI IND STRL 8 (GLOVE) ×2 IMPLANT
GLOVE ECLIPSE 8.0 STRL XLNG CF (GLOVE) ×1 IMPLANT
GOWN STRL REUS W/ TWL XL LVL3 (GOWN DISPOSABLE) ×2 IMPLANT
HEAD CERAMIC DELTA 36 PLUS 1.5 (Hips) IMPLANT
HOLDER FOLEY CATH W/STRAP (MISCELLANEOUS) ×1 IMPLANT
KIT TURNOVER KIT A (KITS) IMPLANT
LINER NEUTRAL 52X36MM PLUS 4 (Liner) IMPLANT
PACK ANTERIOR HIP CUSTOM (KITS) ×1 IMPLANT
PIN SECTOR W/GRIP ACE CUP 52MM (Hips) IMPLANT
SET HNDPC FAN SPRY TIP SCT (DISPOSABLE) ×1 IMPLANT
STAPLER SKIN PROX WIDE 3.9 (STAPLE) IMPLANT
STEM FEMORAL SZ 6MM STD ACTIS (Stem) IMPLANT
STRIP CLOSURE SKIN 1/2X4 (GAUZE/BANDAGES/DRESSINGS) IMPLANT
SUT ETHIBOND NAB CT1 #1 30IN (SUTURE) ×1 IMPLANT
SUT ETHILON 2 0 PS N (SUTURE) IMPLANT
SUT MNCRL AB 4-0 PS2 18 (SUTURE) IMPLANT
SUT VIC AB 0 CT1 36 (SUTURE) ×1 IMPLANT
SUT VIC AB 1 CT1 36 (SUTURE) ×1 IMPLANT
SUT VIC AB 2-0 CT1 TAPERPNT 27 (SUTURE) ×2 IMPLANT
TRAY FOLEY MTR SLVR 14FR STAT (SET/KITS/TRAYS/PACK) IMPLANT
TRAY FOLEY MTR SLVR 16FR STAT (SET/KITS/TRAYS/PACK) IMPLANT
YANKAUER SUCT BULB TIP NO VENT (SUCTIONS) ×1 IMPLANT

## 2023-07-26 NOTE — Op Note (Signed)
 Operative Note  Date of operation: 07/26/2023 Preoperative diagnosis: Right hip primary osteoarthritis Postoperative diagnosis: Same  Procedure: Right direct anterior total hip arthroplasty  Implants: Implant Name Type Inv. Item Serial No. Manufacturer Lot No. LRB No. Used Action  PIN SECTOR W/GRIP ACE CUP - [id number] Hips PIN SECTOR W/GRIP ACE CUP  [employer]PU[employer] [id number] Right 1 Implanted  LINER NEUTRAL 52X36MM PLUS 4 - [id number] Liner LINER NEUTRAL [id number] PLUS 4  DEPUY ORTH[employer] [id number] Right 1 Implanted  STEM FEMORAL SZ STD ACTIS - [id number] Stem STEM FEMORAL SZ STD ACTIS  DEPU[employer]TH[employer] [id number] Right 1 Implanted  HEAD CERAMIC DELTA 36 PLUS 1.5 - [id number] Hips HEAD CERAMIC DELTA 36 PLUS 1.5  DEPU[employer]TH[employer] [id number] Right 1 Implanted   Surgeon: Vanita Panda. Magnus Ivan, MD Assistant: Rexene Edison, PA-C  Anesthesia: Spinal Antibiotics: IV Ancef EBL: 200 cc Complications: None  Indications: The patient is a 73 year old female with debilitating arthritis involving her right hip this been well-documented clinical exam and x-ray findings.  She has daily right hip pain that is detrimentally affecting her mobility, her quality of life and actives daily living.  She has failed conservative treatment for over a year now.  At this point she does wish to proceed with a total knee replacement for her right hip and we agree with this as well.  We did discuss the risks of acute blood loss anemia, nerve and vessel injury, DVT, infection, dislocation, implant failure, leg length differences and wound healing issues.  She understands that our goals are hopefully decreased pain, improved mobility, and improved quality of life.  Procedure description: After informed consent was obtained and the appropriate right hip was marked, the patient was brought to the operating room and set up on the stretcher where spinal anesthesia was obtained.  She was laid in the supine  position on stretcher and a Foley catheter is placed.  Traction boots were placed on both her feet and next she was placed supine on the Hana fracture table with a perineal post and placed in both legs in inline skeletal traction devices but no traction applied.  Her left operative hip and pelvis were assessed radiographically.  The left hip was prepped and draped with DuraPrep and sterile drapes.  A timeout was called and she was identified as the correct patient the correct right hip.  An incision was then made just inferior and posterior to the ASIS and carried slightly obliquely down the leg.  Dissection was carried down to the tensor fascia lata muscle and the tensor fascia was then divided longitudinally to proceed with a direct anterior broach the hip.  Circumflex vessels were identified and cauterized.  The hip capsule was identified and opened up in L-type format finding a moderate joint effusion.  Cobra retractors were placed around the medial and lateral femoral neck and a femoral neck cut was made with an oscillating saw just proximal to the lesser trochanter.  This cut was completed with an osteotome.  A corkscrew guide was placed in the femoral head and the femoral head was removed in its entirety finding a wide area devoid of cartilage.  A bent Hohmann was then placed over the medial acetabular rim and remnants of the acetabular labrum and other debris removed.  Reaming was then initiated from a size 43 reamer and stepwise increments going up to a size 51 reamer with all reamers placed under direct visualization and the last reamer also placed under direct fluoroscopy in order  to obtain the depth and reaming, the inclination and anteversion.  We then placed the real DePuy sector GRIPTION acetabular component size 52 without difficulty followed by a 36+4 liner.  Attention was then turned to the femur.  With the right leg externally rotated to 120 degrees, extended and adducted, a Mueller retractors  placed medially and Hohmann tractor by the greater trochanter.  The lateral joint capsule was released and a box cutting osteotome was used in her femoral canal.  Broaching was then initiated using the Actis broaching system from a size 0 going all the way to the size 6.  With a size 6 in place we trialed a standard offset femoral neck and a 36+1.5 trial hip ball.  The right leg was brought over and up and with traction and internal rotation reduced in the pelvis.  We are pleased with leg length, offset, range of motion and stability.  We assessed this radiographically and clinically.  We then dislocated the hip remove the trial components.  We placed the real Actis femoral component with standard offset size 6 and the real 36+1.5 ceramic head ball.  Again this was reduced in pelvis and we are pleased with leg length, offset, range of motion and stability.  The soft tissue was then irrigated with normal saline solution.  Remnants of the joint capsule were closed with interrupted #1 Ethibond suture followed by normal Vicryl to close the tensor fascia.  0 Vicryl was used to close the deep tissue and 2-0 Vicryl was used to close subcutaneous tissue.  The skin was closed with staples.  An Aquacel dressing was applied.  The patient was taken off of the Hana table and taken the recovery room.  Rexene Edison, PA-C did assist during the entire case and beginning to end and his assistance was crucial and medically necessary for soft tissue management and retraction, helping guide implant placement and a layered closure of the wound.

## 2023-07-26 NOTE — Anesthesia Procedure Notes (Signed)
 Spinal  Patient location during procedure: OR End time: 07/26/2023 12:54 PM Reason for block: surgical anesthesia Staffing Performed: resident/CRNA  Anesthesiologist: Lannie Fields, DO Resident/CRNA: Orest Dikes, CRNA Performed by: Orest Dikes, CRNA Authorized by: Lannie Fields, DO   Preanesthetic Checklist Completed: patient identified, IV checked, site marked, risks and benefits discussed, surgical consent, monitors and equipment checked, pre-op evaluation and timeout performed Spinal Block Patient position: sitting Prep: DuraPrep Patient monitoring: heart rate, cardiac monitor, continuous pulse ox and blood pressure Approach: midline Location: L3-4 Injection technique: single-shot Needle Needle type: Pencan  Needle gauge: 24 G Needle length: 10 cm Assessment Sensory level: T4 Events: CSF return Additional Notes IV functioning, monitors applied to pt. Expiration date of kit checked and confirmed to be in date. Sterile prep and drape, hand hygiene and sterile gloves used. Pt was positioned and spine was prepped in sterile fashion. Skin was anesthetized with lidocaine. Free flow of clear CSF obtained prior to injecting local anesthetic into CSF x 1 attempt. Spinal needle aspirated freely following injection. Needle was carefully withdrawn, and pt tolerated procedure well. Loss of motor and sensory on exam post injection. Dr Salvadore Farber at bedside for entire placement.

## 2023-07-26 NOTE — Transfer of Care (Signed)
 Immediate Anesthesia Transfer of Care Note  Patient: Jaime Rose  Procedure(s) Performed: ARTHROPLASTY, HIP, TOTAL, ANTERIOR APPROACH (Right: Hip)  Patient Location: PACU  Anesthesia Type:MAC and Spinal  Level of Consciousness: drowsy  Airway & Oxygen Therapy: Patient Spontanous Breathing and Patient connected to face mask oxygen  Post-op Assessment: Report given to RN and Post -op Vital signs reviewed and stable  Post vital signs: Reviewed and stable  Last Vitals:  Vitals Value Taken Time  BP 134/59 07/26/23 1435  Temp    Pulse 62 07/26/23 1436  Resp 11 07/26/23 1436  SpO2 100 % 07/26/23 1436  Vitals shown include unfiled device data.  Last Pain:  Vitals:   07/26/23 1034  TempSrc: Oral  PainSc: 4       Patients Stated Pain Goal: 4 (07/26/23 1034)  Complications: No notable events documented.

## 2023-07-26 NOTE — Interval H&P Note (Signed)
 History and Physical Interval Note: Patient understands that she is here today for right total hip replacement to treat her significant right hip pain and arthritis.  There has been no acute or interval change in her medical status.  The risks and benefits of surgery have been discussed in detail and informed consent has been obtained.  The right operative hip has been marked.  07/26/2023 11:27 AM  Jaime Rose  has presented today for surgery, with the diagnosis of Osteoarthritis right hip.  The various methods of treatment have been discussed with the patient and family. After consideration of risks, benefits and other options for treatment, the patient has consented to  Procedure(s): ARTHROPLASTY, HIP, TOTAL, ANTERIOR APPROACH (Right) as a surgical intervention.  The patient's history has been reviewed, patient examined, no change in status, stable for surgery.  I have reviewed the patient's chart and labs.  Questions were answered to the patient's satisfaction.     Kathryne Hitch

## 2023-07-26 NOTE — Anesthesia Procedure Notes (Signed)
 Procedure Name: MAC Date/Time: 07/26/2023 12:43 PM  Performed by: Orest Dikes, CRNAPre-anesthesia Checklist: Patient identified, Emergency Drugs available, Suction available and Patient being monitored Oxygen Delivery Method: Simple face mask

## 2023-07-26 NOTE — Anesthesia Postprocedure Evaluation (Signed)
 Anesthesia Post Note  Patient: Jaime Rose  Procedure(s) Performed: ARTHROPLASTY, HIP, TOTAL, ANTERIOR APPROACH (Right: Hip)     Patient location during evaluation: PACU Anesthesia Type: MAC and Spinal Level of consciousness: awake and alert and oriented Pain management: pain level controlled Vital Signs Assessment: post-procedure vital signs reviewed and stable Respiratory status: spontaneous breathing, nonlabored ventilation and respiratory function stable Cardiovascular status: blood pressure returned to baseline and stable Postop Assessment: no headache, no backache, spinal receding and patient able to bend at knees Anesthetic complications: no   No notable events documented.  Last Vitals:  Vitals:   07/26/23 1545 07/26/23 1606  BP:  (!) 151/67  Pulse: (!) 54 (!) 54  Resp: 11 14  Temp:  36.4 C  SpO2: 99% 100%    Last Pain:  Vitals:   07/26/23 1606  TempSrc: Oral  PainSc: 0-No pain                 Lannie Fields

## 2023-07-27 DIAGNOSIS — Z79899 Other long term (current) drug therapy: Secondary | ICD-10-CM | POA: Diagnosis not present

## 2023-07-27 DIAGNOSIS — M1611 Unilateral primary osteoarthritis, right hip: Secondary | ICD-10-CM | POA: Diagnosis not present

## 2023-07-27 DIAGNOSIS — Z87891 Personal history of nicotine dependence: Secondary | ICD-10-CM | POA: Diagnosis not present

## 2023-07-27 DIAGNOSIS — E039 Hypothyroidism, unspecified: Secondary | ICD-10-CM | POA: Diagnosis not present

## 2023-07-27 LAB — CBC
HCT: 32.7 % — ABNORMAL LOW (ref 36.0–46.0)
Hemoglobin: 10.2 g/dL — ABNORMAL LOW (ref 12.0–15.0)
MCH: 28.9 pg (ref 26.0–34.0)
MCHC: 31.2 g/dL (ref 30.0–36.0)
MCV: 92.6 fL (ref 80.0–100.0)
Platelets: 276 10*3/uL (ref 150–400)
RBC: 3.53 MIL/uL — ABNORMAL LOW (ref 3.87–5.11)
RDW: 13.2 % (ref 11.5–15.5)
WBC: 8.9 10*3/uL (ref 4.0–10.5)
nRBC: 0 % (ref 0.0–0.2)

## 2023-07-27 LAB — BASIC METABOLIC PANEL WITH GFR
Anion gap: 9 (ref 5–15)
BUN: 11 mg/dL (ref 8–23)
CO2: 25 mmol/L (ref 22–32)
Calcium: 8.7 mg/dL — ABNORMAL LOW (ref 8.9–10.3)
Chloride: 100 mmol/L (ref 98–111)
Creatinine, Ser: 0.66 mg/dL (ref 0.44–1.00)
GFR, Estimated: >60 mL/min (ref >60)
Glucose, Bld: 133 mg/dL — ABNORMAL HIGH (ref 70–99)
Potassium: 3.6 mmol/L (ref 3.5–5.1)
Sodium: 134 mmol/L — ABNORMAL LOW (ref 135–145)

## 2023-07-27 MED ORDER — OXYCODONE HCL 5 MG PO TABS: 5.0000 mg | ORAL_TABLET | Freq: Four times a day (QID) | ORAL | 0 refills | Status: DC | PRN

## 2023-07-27 MED ORDER — METHOCARBAMOL 500 MG PO TABS: 500.0000 mg | ORAL_TABLET | Freq: Four times a day (QID) | ORAL | 1 refills | Status: DC | PRN

## 2023-07-27 MED ORDER — ASPIRIN 81 MG PO CHEW: 81.0000 mg | CHEWABLE_TABLET | Freq: Two times a day (BID) | ORAL | 0 refills | Status: DC

## 2023-07-27 NOTE — Progress Notes (Signed)
 Subjective: 1 Day Post-Op Procedure(s) (LRB): ARTHROPLASTY, HIP, TOTAL, ANTERIOR APPROACH (Right) Patient reports pain as moderate.  However, she reports not having a good night and is struggling with PT this morning.  Objective: Vital signs in last 24 hours: Temp:  [97.4 F (36.3 C)-98.9 F (37.2 C)] 98.9 F (37.2 C) (03/29 0457) Pulse Rate:  [54-97] 83 (03/29 0720) Resp:  [11-19] 16 (03/29 0720) BP: (112-168)/(51-83) 120/61 (03/29 0720) SpO2:  [92 %-100 %] 95 % (03/29 0720) Weight:  [72.1 kg] 72.1 kg (03/28 1639)  Intake/Output from previous day: 03/28 0701 - 03/29 0700 In: 2573 [P.O.:360; I.V.:2013; IV Piggyback:200] Out: 2220 [Urine:2020; Blood:200] Intake/Output this shift: Total I/O In: -  Out: 200 [Urine:200]  Recent Labs    07/27/23 0312  HGB 10.2*   Recent Labs    07/27/23 0312  WBC 8.9  RBC 3.53*  HCT 32.7*  PLT 276   Recent Labs    07/27/23 0312  NA 134*  K 3.6  CL 100  CO2 25  BUN 11  CREATININE 0.66  GLUCOSE 133*  CALCIUM 8.7*   No results for input(s): "LABPT", "INR" in the last 72 hours.  Sensation intact distally Intact pulses distally Dorsiflexion/Plantar flexion intact Incision: dressing C/D/I   Assessment/Plan: 1 Day Post-Op Procedure(s) (LRB): ARTHROPLASTY, HIP, TOTAL, ANTERIOR APPROACH (Right) Up with therapy Plan for discharge tomorrow Discharge home with home health      Kathryne Hitch 07/27/2023, 10:16 AM

## 2023-07-27 NOTE — Progress Notes (Signed)
 Physical Therapy Treatment Patient Details Name: Jaime Rose MRN: [id number] DOB: [birth year] Today's Date: 07/27/2023   History of Present Illness 73 yo female s/p R THA on 07/27/22. PMH: pre-DM    PT Comments  Pt progressing well, incr amb distance this pm, fatigued afterwards but activity tol improving overall. HEP initiated. Continue PT POC   If plan is discharge home, recommend the following: A little help with walking and/or transfers;A little help with bathing/dressing/bathroom;Assist for transportation;Help with stairs or ramp for entrance   Can travel by private vehicle        Equipment Recommendations  Rolling walker (2 wheels)    Recommendations for Other Services       Precautions / Restrictions Precautions Precautions: Fall Recall of Precautions/Restrictions: Intact Restrictions Weight Bearing Restrictions Per Provider Order: No RLE Weight Bearing Per Provider Order: Weight bearing as tolerated     Mobility  Bed Mobility Overal bed mobility: Needs Assistance Bed Mobility: Supine to Sit     Supine to sit: Min assist, Mod assist     General bed mobility comments: in recliner    Transfers Overall transfer level: Needs assistance Equipment used: Rolling walker (2 wheels) Transfers: Sit to/from Stand Sit to Stand: Min assist           General transfer comment: cues for hand placement and RLE position    Ambulation/Gait Ambulation/Gait assistance: Min assist Gait Distance (Feet): 30 Feet Assistive device: Rolling walker (2 wheels) Gait Pattern/deviations: Step-to pattern, Antalgic, Trunk flexed       General Gait Details: cues for sequence and RW position. incr time   Stairs             Wheelchair Mobility     Tilt Bed    Modified Rankin (Stroke Patients Only)       Balance Overall balance assessment: Needs assistance Sitting-balance support: Feet supported, Bilateral upper extremity supported, Single extremity  supported Sitting balance-Leahy Scale: Fair     Standing balance support: Reliant on assistive device for balance, During functional activity Standing balance-Leahy Scale: Poor                              Communication Communication Communication: No apparent difficulties  Cognition Arousal: Alert Behavior During Therapy: WFL for tasks assessed/performed   PT - Cognitive impairments: No apparent impairments                         Following commands: Intact      Cueing Cueing Techniques: Verbal cues  Exercises Total Joint Exercises Ankle Circles/Pumps: AROM, Both, 5 reps Quad Sets: AROM, Both, 5 reps Heel Slides: AAROM, Right, 10 reps Hip ABduction/ADduction: AAROM, Right, 10 reps    General Comments        Pertinent Vitals/Pain Pain Assessment Pain Assessment: 0-10 Pain Score: 5  Pain Location: R hip Pain Descriptors / Indicators: Aching, Discomfort, Sore Pain Intervention(s): Limited activity within patient's tolerance, Monitored during session, Repositioned, Premedicated before session    Home Living                          Prior Function            PT Goals (current goals can now be found in the care plan section) Acute Rehab PT Goals PT Goal Formulation: With patient Time For Goal Achievement: 08/02/23 Progress towards PT goals: Progressing toward goals  Frequency    7X/week      PT Plan      Co-evaluation              AM-PAC PT "6 Clicks" Mobility   Outcome Measure  Help needed turning from your back to your side while in a flat bed without using bedrails?: A Little Help needed moving from lying on your back to sitting on the side of a flat bed without using bedrails?: A Little Help needed moving to and from a bed to a chair (including a wheelchair)?: A Little Help needed standing up from a chair using your arms (e.g., wheelchair or bedside chair)?: A Little Help needed to walk in hospital room?: A  Little Help needed climbing 3-5 steps with a railing? : A Lot 6 Click Score: 17    End of Session Equipment Utilized During Treatment: Gait belt Activity Tolerance: Patient tolerated treatment well Patient left: in chair;with call bell/phone within reach;with chair alarm set Nurse Communication: Mobility status PT Visit Diagnosis: History of falling (Z91.81);Other abnormalities of gait and mobility (R26.89)     Time: 1610-9604 PT Time Calculation (min) (ACUTE ONLY): 22 min  Charges:    $Gait Training: 8-22 mins PT General Charges $$ ACUTE PT VISIT: 1 Visit                     Martiza Speth, PT  Acute Rehab Dept ([facility]) [phone]  07/27/2023    [facility] 07/27/2023, 2:17 PM

## 2023-07-27 NOTE — Progress Notes (Signed)
 Subjective: 1 Day Post-Op Procedure(s) (LRB): ARTHROPLASTY, HIP, TOTAL, ANTERIOR APPROACH (Right) Patient reports pain as moderate.    Objective: Vital signs in last 24 hours: Temp:  [97.4 F (36.3 C)-99.1 F (37.3 C)] 99.1 F (37.3 C) (03/29 1037) Pulse Rate:  [54-97] 76 (03/29 1037) Resp:  [11-19] 16 (03/29 1037) BP: (112-161)/(51-83) 127/64 (03/29 1037) SpO2:  [92 %-100 %] 94 % (03/29 1037) Weight:  [72.1 kg] 72.1 kg (03/28 1639)  Intake/Output from previous day: 03/28 0701 - 03/29 0700 In: 2573 [P.O.:360; I.V.:2013; IV Piggyback:200] Out: 2220 [Urine:2020; Blood:200] Intake/Output this shift: Total I/O In: 240 [P.O.:240] Out: 200 [Urine:200]  Recent Labs    07/27/23 0312  HGB 10.2*   Recent Labs    07/27/23 0312  WBC 8.9  RBC 3.53*  HCT 32.7*  PLT 276   Recent Labs    07/27/23 0312  NA 134*  K 3.6  CL 100  CO2 25  BUN 11  CREATININE 0.66  GLUCOSE 133*  CALCIUM 8.7*   No results for input(s): "LABPT", "INR" in the last 72 hours.  Sensation intact distally Intact pulses distally Dorsiflexion/Plantar flexion intact Incision: dressing C/D/I   Assessment/Plan: 1 Day Post-Op Procedure(s) (LRB): ARTHROPLASTY, HIP, TOTAL, ANTERIOR APPROACH (Right) Up with therapy Plan for discharge tomorrow Discharge home with home health      Kathryne Hitch 07/27/2023, 10:56 AM

## 2023-07-27 NOTE — Discharge Instructions (Signed)
 Per [facility] clinic policy, our goal is ensure optimal postoperative pain control with a multimodal pain management strategy. For all [facility] patients, our goal is to wean post-operative narcotic medications by 6 weeks post-operatively. If this is not possible due to utilization of pain medication prior to surgery, your [facility] doctor will support your acute post-operative pain control for the first 6 weeks postoperatively, with a plan to transition you back to your primary pain team following that. Jaime Rose will work to ensure a Therapist, occupational.  INSTRUCTIONS AFTER JOINT REPLACEMENT   Remove items at home which could result in a fall. This includes throw rugs or furniture in walking pathways ICE to the affected joint every three hours while awake for 30 minutes at a time, for at least the first 3-5 days, and then as needed for pain and swelling.  Continue to use ice for pain and swelling. You may notice swelling that will progress down to the foot and ankle.  This is normal after surgery.  Elevate your leg when you are not up walking on it.   Continue to use the breathing machine you got in the hospital (incentive spirometer) which will help keep your temperature down.  It is common for your temperature to cycle up and down following surgery, especially at night when you are not up moving around and exerting yourself.  The breathing machine keeps your lungs expanded and your temperature down.   DIET:  As you were doing prior to hospitalization, we recommend a well-balanced diet.  DRESSING / WOUND CARE / SHOWERING  Keep the surgical dressing until follow up.  The dressing is water proof, so you can shower without any extra covering.  IF THE DRESSING FALLS OFF or the wound gets wet inside, change the dressing with sterile gauze.  Please use good hand washing techniques before changing the dressing.  Do not use any lotions or creams on the incision until instructed by your surgeon.     ACTIVITY  Increase activity slowly as tolerated, but follow the weight bearing instructions below.   No driving for 6 weeks or until further direction given by your physician.  You cannot drive while taking narcotics.  No lifting or carrying greater than 10 lbs. until further directed by your surgeon. Avoid periods of inactivity such as sitting longer than an hour when not asleep. This helps prevent blood clots.  You may return to work once you are authorized by your doctor.     WEIGHT BEARING   Weight bearing as tolerated with assist device (walker, cane, etc) as directed, use it as long as suggested by your surgeon or therapist, typically at least 4-6 weeks.   EXERCISES  Results after joint replacement surgery are often greatly improved when you follow the exercise, range of motion and muscle strengthening exercises prescribed by your doctor. Safety measures are also important to protect the joint from further injury. Any time any of these exercises cause you to have increased pain or swelling, decrease what you are doing until you are comfortable again and then slowly increase them. If you have problems or questions, call your caregiver or physical therapist for advice.   Rehabilitation is important following a joint replacement. After just a few days of immobilization, the muscles of the leg can become weakened and shrink (atrophy).  These exercises are designed to build up the tone and strength of the thigh and leg muscles and to improve motion. Often times heat used for twenty to thirty minutes before  working out will loosen up your tissues and help with improving the range of motion but do not use heat for the first two weeks following surgery (sometimes heat can increase post-operative swelling).   These exercises can be done on a training (exercise) mat, on the floor, on a table or on a bed. Use whatever works the best and is most comfortable for you.    Use music or television  while you are exercising so that the exercises are a pleasant break in your day. This will make your life better with the exercises acting as a break in your routine that you can look forward to.   Perform all exercises about fifteen times, three times per day or as directed.  You should exercise both the operative leg and the other leg as well.  Exercises include:   Quad Sets - Tighten up the muscle on the front of the thigh (Quad) and hold for 5-10 seconds.   Straight Leg Raises - With your knee straight (if you were given a brace, keep it on), lift the leg to 60 degrees, hold for 3 seconds, and slowly lower the leg.  Perform this exercise against resistance later as your leg gets stronger.  Leg Slides: Lying on your back, slowly slide your foot toward your buttocks, bending your knee up off the floor (only go as far as is comfortable). Then slowly slide your foot back down until your leg is flat on the floor again.  Angel Wings: Lying on your back spread your legs to the side as far apart as you can without causing discomfort.  Hamstring Strength:  Lying on your back, push your heel against the floor with your leg straight by tightening up the muscles of your buttocks.  Repeat, but this time bend your knee to a comfortable angle, and push your heel against the floor.  You may put a pillow under the heel to make it more comfortable if necessary.   A rehabilitation program following joint replacement surgery can speed recovery and prevent re-injury in the future due to weakened muscles. Contact your doctor or a physical therapist for more information on knee rehabilitation.    CONSTIPATION  Constipation is defined medically as fewer than three stools per week and severe constipation as less than one stool per week.  Even if you have a regular bowel pattern at home, your normal regimen is likely to be disrupted due to multiple reasons following surgery.  Combination of anesthesia, postoperative  narcotics, change in appetite and fluid intake all can affect your bowels.   YOU MUST use at least one of the following options; they are listed in order of increasing strength to get the job done.  They are all available over the counter, and you may need to use some, POSSIBLY even all of these options:    Drink plenty of fluids (prune juice may be helpful) and high fiber foods Colace 100 mg by mouth twice a day  Senokot for constipation as directed and as needed Dulcolax (bisacodyl), take with full glass of water  Miralax (polyethylene glycol) once or twice a day as needed.  If you have tried all these things and are unable to have a bowel movement in the first 3-4 days after surgery call either your surgeon or your primary doctor.    If you experience loose stools or diarrhea, hold the medications until you stool forms back up.  If your symptoms do not get better within 1 week  or if they get worse, check with your doctor.  If you experience "the worst abdominal pain ever" or develop nausea or vomiting, please contact the office immediately for further recommendations for treatment.   ITCHING:  If you experience itching with your medications, try taking only a single pain pill, or even half a pain pill at a time.  You can also use Benadryl over the counter for itching or also to help with sleep.   TED HOSE STOCKINGS:  Use stockings on both legs until for at least 2 weeks or as directed by physician office. They may be removed at night for sleeping.  MEDICATIONS:  See your medication summary on the "After Visit Summary" that nursing will review with you.  You may have some home medications which will be placed on hold until you complete the course of blood thinner medication.  It is important for you to complete the blood thinner medication as prescribed.  PRECAUTIONS:  If you experience chest pain or shortness of breath - call 911 immediately for transfer to the hospital emergency department.    If you develop a fever greater that 101 F, purulent drainage from wound, increased redness or drainage from wound, foul odor from the wound/dressing, or calf pain - CONTACT YOUR SURGEON.                                                   FOLLOW-UP APPOINTMENTS:  If you do not already have a post-op appointment, please call the office for an appointment to be seen by your surgeon.  Guidelines for how soon to be seen are listed in your "After Visit Summary", but are typically between 1-4 weeks after surgery.  OTHER INSTRUCTIONS:   Knee Replacement:  Do not place pillow under knee, focus on keeping the knee straight while resting. CPM instructions: 0-90 degrees, 2 hours in the morning, 2 hours in the afternoon, and 2 hours in the evening. Place foam block, curve side up under heel at all times except when in CPM or when walking.  DO NOT modify, tear, cut, or change the foam block in any way.  POST-OPERATIVE OPIOID TAPER INSTRUCTIONS: It is important to wean off of your opioid medication as soon as possible. If you do not need pain medication after your surgery it is ok to stop day one. Opioids include: Codeine, Hydrocodone(Norco, Vicodin), Oxycodone(Percocet, oxycontin) and hydromorphone amongst others.  Long term and even short term use of opiods can cause: Increased pain response Dependence Constipation Depression Respiratory depression And more.  Withdrawal symptoms can include Flu like symptoms Nausea, vomiting And more Techniques to manage these symptoms Hydrate well Eat regular healthy meals Stay active Use relaxation techniques(deep breathing, meditating, yoga) Do Not substitute Alcohol to help with tapering If you have been on opioids for less than two weeks and do not have pain than it is ok to stop all together.  Plan to wean off of opioids This plan should start within one week post op of your joint replacement. Maintain the same interval or time between taking each dose  and first decrease the dose.  Cut the total daily intake of opioids by one tablet each day Next start to increase the time between doses. The last dose that should be eliminated is the evening dose.   MAKE SURE YOU:  Understand these instructions.  Get help right away if you are not doing well or get worse.    Thank you for letting us be a part of your medical care team.  It is a privilege we respect greatly.  We hope these instructions will help you stay on track for a fast and full recovery!      Dental Antibiotics:  In most cases prophylactic antibiotics for Dental procdeures after total joint surgery are not necessary.  Exceptions are as follows:  1. History of prior total joint infection  2. Severely immunocompromised (Organ Transplant, cancer chemotherapy, Rheumatoid biologic meds such as Humera)  3. Poorly controlled diabetes (A1C &gt; 8.0, blood glucose over 200)  If you have one of these conditions, contact your surgeon for an antibiotic prescription, prior to your dental procedure.

## 2023-07-27 NOTE — Evaluation (Signed)
 Physical Therapy Evaluation Patient Details Name: Jaime Rose MRN: [id number] DOB: 10/14/1950 Today's Date: 07/27/2023  History of Present Illness  73 yo female s/p R THA on 07/27/22. PMH: pre-DM  Clinical Impression  Pt is s/p THA resulting in the deficits listed below (see PT Problem List).  Pt to EOB with min-mod assist and able to amb ~8' with RW and min assist. Limited by pain and fatigue; mild dizziness end of distance and provided seated rest. Anticipate pt will need another day to allow safe d/c home    Pt will benefit from acute skilled PT to increase their independence and safety with mobility to facilitate discharge.          If plan is discharge home, recommend the following: A little help with walking and/or transfers;A little help with bathing/dressing/bathroom;Assist for transportation;Help with stairs or ramp for entrance   Can travel by private vehicle        Equipment Recommendations Rolling walker (2 wheels)  Recommendations for Other Services       Functional Status Assessment Patient has had a recent decline in their functional status and demonstrates the ability to make significant improvements in function in a reasonable and predictable amount of time.     Precautions / Restrictions Precautions Precautions: Fall Restrictions Weight Bearing Restrictions Per Provider Order: No RLE Weight Bearing Per Provider Order: Weight bearing as tolerated      Mobility  Bed Mobility Overal bed mobility: Needs Assistance Bed Mobility: Supine to Sit     Supine to sit: Min assist, Mod assist     General bed mobility comments: cues for sequence and self assist; incr time and effort. assist for lateral scooting, RLE, trunk    Transfers Overall transfer level: Needs assistance Equipment used: Rolling walker (2 wheels) Transfers: Sit to/from Stand Sit to Stand: Min assist, From elevated surface           General transfer comment: cues for hand  placement and RLE position    Ambulation/Gait Ambulation/Gait assistance: Min assist Gait Distance (Feet): 8 Feet Assistive device: Rolling walker (2 wheels) Gait Pattern/deviations: Step-to pattern, Antalgic       General Gait Details: cues for sequence and RW position. incr time  Stairs            Wheelchair Mobility     Tilt Bed    Modified Rankin (Stroke Patients Only)       Balance Overall balance assessment: Needs assistance Sitting-balance support: Feet supported, Bilateral upper extremity supported, Single extremity supported Sitting balance-Leahy Scale: Poor     Standing balance support: Reliant on assistive device for balance, During functional activity Standing balance-Leahy Scale: Poor                               Pertinent Vitals/Pain Pain Assessment Pain Assessment: 0-10 Pain Score: 4  Pain Location: R hip Pain Descriptors / Indicators: Aching, Discomfort, Sore Pain Intervention(s): Premedicated before session, Monitored during session, Limited activity within patient's tolerance, Ice applied    Home Living Family/patient expects to be discharged to:: Private residence Living Arrangements: Alone Available Help at Discharge: Family Type of Home: House Home Access: Stairs to enter;Ramped entrance Entrance Stairs-Rails: Right;Left;Can reach both Entrance Stairs-Number of Steps: 3   Home Layout: Two level;Able to live on main level with bedroom/bathroom Home Equipment: Toilet riser;Cane - quad;Shower seat      Prior Function Prior Level of Function : Independent/Modified Independent  Extremity/Trunk Assessment   Upper Extremity Assessment Upper Extremity Assessment: Overall WFL for tasks assessed    Lower Extremity Assessment Lower Extremity Assessment: RLE deficits/detail RLE Deficits / Details: ankle WFL, knee and hip grossly 2/5 limited by post op pain RLE: Unable to fully assess due to  pain       Communication   Communication Communication: No apparent difficulties    Cognition Arousal: Alert Behavior During Therapy: WFL for tasks assessed/performed   PT - Cognitive impairments: No apparent impairments                         Following commands: Intact       Cueing Cueing Techniques: Verbal cues     General Comments      Exercises Total Joint Exercises Ankle Circles/Pumps: AROM, Both, 5 reps Quad Sets: AROM, Both, 5 reps   Assessment/Plan    PT Assessment Patient needs continued PT services  PT Problem List Decreased strength;Decreased activity tolerance;Decreased balance;Decreased range of motion;Decreased mobility;Pain;Decreased knowledge of use of DME       PT Treatment Interventions DME instruction;Gait training;Functional mobility training;Patient/family education;Therapeutic activities;Stair training;Therapeutic exercise    PT Goals (Current goals can be found in the Care Plan section)  Acute Rehab PT Goals PT Goal Formulation: With patient Time For Goal Achievement: 08/02/23    Frequency 7X/week     Co-evaluation               AM-PAC PT "6 Clicks" Mobility  Outcome Measure Help needed turning from your back to your side while in a flat bed without using bedrails?: A Little Help needed moving from lying on your back to sitting on the side of a flat bed without using bedrails?: A Little Help needed moving to and from a bed to a chair (including a wheelchair)?: A Little Help needed standing up from a chair using your arms (e.g., wheelchair or bedside chair)?: A Little Help needed to walk in hospital room?: A Little Help needed climbing 3-5 steps with a railing? : A Lot 6 Click Score: 17    End of Session Equipment Utilized During Treatment: Gait belt Activity Tolerance: Patient limited by fatigue;Patient limited by pain Patient left: in chair;with call bell/phone within reach;with chair alarm set Nurse  Communication: Mobility status PT Visit Diagnosis: History of falling (Z91.81);Other abnormalities of gait and mobility (R26.89)    Time: 1610-9604 PT Time Calculation (min) (ACUTE ONLY): 23 min   Charges:   PT Evaluation $PT Eval Low Complexity: 1 Low PT Treatments $Gait Training: 8-22 mins PT General Charges $$ ACUTE PT VISIT: 1 Visit         Toyia Jelinek, PT  Acute Rehab Dept [facility]) [phone]  07/27/2023   [facility] 07/27/2023, 11:32 AM

## 2023-07-27 NOTE — Plan of Care (Signed)
   Problem: Education: Goal: Knowledge of General Education information will improve Description Including pain rating scale, medication(s)/side effects and non-pharmacologic comfort measures Outcome: Progressing

## 2023-07-27 NOTE — Care Management Obs Status (Signed)
 MEDICARE OBSERVATION STATUS NOTIFICATION   Patient Details  Name: Jaime Rose MRN: [id number] Date of Birth: [birth year]   Medicare Observation Status Notification Given:  Yes    Howell Rucks, RN 07/27/2023, 10:16 AM

## 2023-07-27 NOTE — TOC Transition Note (Signed)
 Transition of Care [facility]) - Discharge Note   Patient Details  Name: Jaime Rose MRN: [id number] Date of Birth: [birth year]  Transition of Care [facility]) CM/SW Contact:  Howell Rucks, RN Phone Number: 07/27/2023, 12:31 PM   Clinical Narrative:   Met with pt at bedside to review dc therapy and home equipment needs, pt confirmed HH PT with [facility], needs RW. NCM called to [facility] after hours, sw [facility], to deliver RW to bedside. No further TOC needs.     Final next level of care: Home w Home Health Services Barriers to Discharge: No Barriers Identified   Patient Goals and CMS Choice Patient states their goals for this hospitalization and ongoing recovery are:: return home          Discharge Placement                       Discharge Plan and Services Additional resources added to the After Visit Summary for                                       Social Drivers of Health (SDOH) Interventions SDOH Screenings   Food Insecurity: No Food Insecurity (07/26/2023)  Housing: Low Risk  (07/26/2023)  Transportation Needs: No Transportation Needs (07/26/2023)  Utilities: Not At Risk (07/26/2023)  Alcohol Screen: Low Risk  (05/23/2023)  Depression (PHQ2-9): Low Risk  (12/14/2022)  Financial Resource Strain: Low Risk  (05/23/2023)  Physical Activity: Sufficiently Active (05/23/2023)  Social Connections: Moderately Integrated (07/26/2023)  Stress: No Stress Concern Present (05/23/2023)  Tobacco Use: Medium Risk (07/26/2023)  Health Literacy: Adequate Health Literacy (12/14/2022)     Readmission Risk Interventions     No data to display

## 2023-07-28 ENCOUNTER — Other Ambulatory Visit: Payer: Self-pay | Admitting: Internal Medicine

## 2023-07-28 DIAGNOSIS — Z87891 Personal history of nicotine dependence: Secondary | ICD-10-CM | POA: Diagnosis not present

## 2023-07-28 DIAGNOSIS — Z79899 Other long term (current) drug therapy: Secondary | ICD-10-CM | POA: Diagnosis not present

## 2023-07-28 DIAGNOSIS — M1611 Unilateral primary osteoarthritis, right hip: Secondary | ICD-10-CM | POA: Diagnosis not present

## 2023-07-28 DIAGNOSIS — Z1211 Encounter for screening for malignant neoplasm of colon: Secondary | ICD-10-CM

## 2023-07-28 DIAGNOSIS — E039 Hypothyroidism, unspecified: Secondary | ICD-10-CM | POA: Diagnosis not present

## 2023-07-28 NOTE — Progress Notes (Signed)
 PT TX NOTE  07/28/23 1300  PT Visit Information  Last PT Received On 07/28/23  Assistance Needed Pt continues to make steady gains, meeting goals; supportive dtr present for session. Pt is ready to d/c with family support  from PT standpoint  History of Present Illness 73 yo female s/p R THA on 07/27/22. PMH: pre-DM  Precautions  Precautions Fall  Recall of Precautions/Restrictions Intact  Restrictions  RLE Weight Bearing Per Provider Order WBAT  Pain Assessment  Pain Assessment 0-10  Pain Score 4  Pain Location R hip  Pain Descriptors / Indicators Discomfort;Sore  Pain Intervention(s) Limited activity within patient's tolerance;Monitored during session;Premedicated before session;Repositioned  Cognition  Arousal Alert  Behavior During Therapy WFL for tasks assessed/performed  PT - Cognitive impairments No apparent impairments  Following Commands  Following commands Intact  Cueing  Cueing Techniques Verbal cues  Communication  Communication No apparent difficulties  Bed Mobility  General bed mobility comments in recliner  Transfers  Overall transfer level Needs assistance  Equipment used Rolling walker (2 wheels)  Transfers Sit to/from Stand  Sit to Stand Contact guard assist;Supervision  General transfer comment pt self cues for hand placement and RLE position; STS from [facility] and recliner  Ambulation/Gait  Ambulation/Gait assistance Contact guard assist;Supervision  Gait Distance (Feet) 70 Feet (+10')  Assistive device Rolling walker (2 wheels)  Gait Pattern/deviations Step-to pattern;Antalgic;Trunk flexed;Decreased step length - right;Decreased step length - left;Step-through pattern  General Gait Details cues for sequence, trunk extension, upward gaze and RW position. incr time; beginning step through pattern with improvign wt shift to RLE  Stairs Yes  Stairs assistance Contact guard assist  Stair Management Two rails;Step to pattern;Forwards  Number of Stairs  (up 2  down 3)  General stair comments cues for sequence, goo stability; dtr present and able to cue and assist as needed  PT - End of Session  Equipment Utilized During Treatment Gait belt  Activity Tolerance Patient tolerated treatment well  Patient left in chair;with call bell/phone within reach;with family/visitor present  Nurse Communication Mobility status   PT - Assessment/Plan  PT Visit Diagnosis History of falling (Z91.81);Other abnormalities of gait and mobility (R26.89)  PT Frequency (ACUTE ONLY) 7X/week  Follow Up Recommendations Follow physician's recommendations for discharge plan and follow up therapies  Patient can return home with the following A little help with walking and/or transfers;A little help with bathing/dressing/bathroom;Assist for transportation;Help with stairs or ramp for entrance  PT equipment Rolling walker (2 wheels)  AM-PAC PT "6 Clicks" Mobility Outcome Measure (Version 2)  Help needed turning from your back to your side while in a flat bed without using bedrails? 3  Help needed moving from lying on your back to sitting on the side of a flat bed without using bedrails? 3  Help needed moving to and from a bed to a chair (including a wheelchair)? 3  Help needed standing up from a chair using your arms (e.g., wheelchair or bedside chair)? 3  Help needed to walk in hospital room? 3  Help needed climbing 3-5 steps with a railing?  3  6 Click Score 18  Consider Recommendation of Discharge To: [facility]  PT Goal Progression  Progress towards PT goals Progressing toward goals  Acute Rehab PT Goals  PT Goal Formulation With patient  Time For Goal Achievement 08/02/23  PT Time Calculation  PT Start Time (ACUTE ONLY) 1325  PT Stop Time (ACUTE ONLY) 1357  PT Time Calculation (min) (ACUTE ONLY) 32  min  PT General Charges  $$ ACUTE PT VISIT 1 Visit  PT Treatments  $Gait Training 23-37 mins

## 2023-07-28 NOTE — Progress Notes (Signed)
 Subjective: 2 Days Post-Op Procedure(s) (LRB): ARTHROPLASTY, HIP, TOTAL, ANTERIOR APPROACH (Right) Patient reports pain as moderate.    Objective: Vital signs in last 24 hours: Temp:  [98.4 F (36.9 C)-99.1 F (37.3 C)] 98.7 F (37.1 C) (03/30 0607) Pulse Rate:  [76-86] 86 (03/30 0607) Resp:  [16-17] 17 (03/30 0607) BP: (120-131)/(54-64) 131/61 (03/30 0607) SpO2:  [94 %-98 %] 94 % (03/30 0607)  Intake/Output from previous day: 03/29 0701 - 03/30 0700 In: 787.2 [P.O.:620; I.V.:167.2] Out: 1550 [Urine:1550] Intake/Output this shift: No intake/output data recorded.  Recent Labs    07/27/23 0312  HGB 10.2*   Recent Labs    07/27/23 0312  WBC 8.9  RBC 3.53*  HCT 32.7*  PLT 276   Recent Labs    07/27/23 0312  NA 134*  K 3.6  CL 100  CO2 25  BUN 11  CREATININE 0.66  GLUCOSE 133*  CALCIUM 8.7*   No results for input(s): "LABPT", "INR" in the last 72 hours.  Sensation intact distally Intact pulses distally Dorsiflexion/Plantar flexion intact Incision: dressing C/D/I   Assessment/Plan: 2 Days Post-Op Procedure(s) (LRB): ARTHROPLASTY, HIP, TOTAL, ANTERIOR APPROACH (Right) Up with therapy Discharge home with home health Plan for discharge home today      [facility] 07/28/2023, 7:40 AM

## 2023-07-28 NOTE — Plan of Care (Signed)
  Problem: Education: Goal: Knowledge of General Education information will improve Description: Including pain rating scale, medication(s)/side effects and non-pharmacologic comfort measures Outcome: Progressing   Problem: Activity: Goal: Risk for activity intolerance will decrease Outcome: Progressing   Problem: Elimination: Goal: Will not experience complications related to urinary retention Outcome: Progressing   Problem: Safety: Goal: Ability to remain free from injury will improve Outcome: Progressing   Problem: Pain Management: Goal: Pain level will decrease with appropriate interventions Outcome: Progressing

## 2023-07-28 NOTE — Progress Notes (Signed)
 Physical Therapy Treatment Patient Details Name: Jaime Rose MRN: [id number] DOB: May 10, 1950 Today's Date: 07/28/2023   History of Present Illness 73 yo female s/p R THA on 07/27/22. PMH: pre-DM    PT Comments  Pt progressing well this am.  Incr gait distance, activity tol and pain improving; will see again in pm and pt will likely be ready to d/c home later today    If plan is discharge home, recommend the following: A little help with walking and/or transfers;A little help with bathing/dressing/bathroom;Assist for transportation;Help with stairs or ramp for entrance   Can travel by private vehicle        Equipment Recommendations  Rolling walker (2 wheels)    Recommendations for Other Services       Precautions / Restrictions Precautions Precautions: Fall Recall of Precautions/Restrictions: Intact Restrictions RLE Weight Bearing Per Provider Order: Weight bearing as tolerated     Mobility  Bed Mobility               General bed mobility comments: pt on EOB with dtr present    Transfers Overall transfer level: Needs assistance Equipment used: Rolling walker (2 wheels) Transfers: Sit to/from Stand Sit to Stand: Contact guard assist           General transfer comment: cues for hand placement and RLE position    Ambulation/Gait Ambulation/Gait assistance: Contact guard assist Gait Distance (Feet): 70 Feet Assistive device: Rolling walker (2 wheels) Gait Pattern/deviations: Step-to pattern, Antalgic, Trunk flexed, Decreased step length - right, Decreased step length - left       General Gait Details: cues for sequence, trunk extension, upward gaze and RW position. incr time   Optometrist     Tilt Bed    Modified Rankin (Stroke Patients Only)       Balance                                            Communication Communication Communication: No apparent difficulties  Cognition  Arousal: Alert Behavior During Therapy: WFL for tasks assessed/performed   PT - Cognitive impairments: No apparent impairments                         Following commands: Intact      Cueing Cueing Techniques: Verbal cues  Exercises Total Joint Exercises Ankle Circles/Pumps: AROM, Both, 5 reps Quad Sets: AROM, Both, 5 reps Heel Slides: AAROM, Right, 10 reps Hip ABduction/ADduction: AAROM, Right, 10 reps    General Comments        Pertinent Vitals/Pain Pain Assessment Pain Assessment: 0-10 Pain Score: 4  Pain Location: R hip Pain Descriptors / Indicators: Discomfort, Sore Pain Intervention(s): Limited activity within patient's tolerance, Monitored during session, Premedicated before session, Repositioned    Home Living                          Prior Function            PT Goals (current goals can now be found in the care plan section) Acute Rehab PT Goals PT Goal Formulation: With patient Time For Goal Achievement: 08/02/23 Progress towards PT goals: Progressing toward goals    Frequency    7X/week      PT  Plan      Co-evaluation              AM-PAC PT "6 Clicks" Mobility   Outcome Measure  Help needed turning from your back to your side while in a flat bed without using bedrails?: A Little Help needed moving from lying on your back to sitting on the side of a flat bed without using bedrails?: A Little Help needed moving to and from a bed to a chair (including a wheelchair)?: A Little Help needed standing up from a chair using your arms (e.g., wheelchair or bedside chair)?: A Little Help needed to walk in hospital room?: A Little Help needed climbing 3-5 steps with a railing? : A Little 6 Click Score: 18    End of Session Equipment Utilized During Treatment: Gait belt Activity Tolerance: Patient tolerated treatment well Patient left: in chair;with call bell/phone within reach;with family/visitor present Nurse Communication:  Mobility status PT Visit Diagnosis: History of falling (Z91.81);Other abnormalities of gait and mobility (R26.89)     Time: 1610-9604 PT Time Calculation (min) (ACUTE ONLY): 23 min  Charges:    $Gait Training: 8-22 mins $Therapeutic Exercise: 8-22 mins PT General Charges $$ ACUTE PT VISIT: 1 Visit                     Navy Belay, PT  Acute Rehab Dept [facility]) [phone]  07/28/2023    [facility] 07/28/2023, 10:17 AM

## 2023-07-28 NOTE — Discharge Summary (Signed)
 Patient ID: Jaime Rose MRN: [id number] DOB/AGE: [birth year] 73 y.o.  Admit date: 07/26/2023 Discharge date: 07/28/2023  Admission Diagnoses:  Unilateral primary osteoarthritis, right hip  Discharge Diagnoses:  Principal Problem:   Unilateral primary osteoarthritis, right hip Active Problems:   Status post total replacement of right hip   Past Medical History:  Diagnosis Date   ALLERGIC RHINITIS    Allergy 1985   seasonal allergies   Arthritis    CHICKENPOX, HX OF 12/21/2009   Qualifier: Diagnosis of  By: Felicity Coyer MD, Raenette Rover    Heart murmur    MR   Hyperthyroidism    normal TFTs 11/2009 , dx 10/2011   Pre-diabetes    Varicose vein    s/p sclerosis tx summer 2013    Surgeries: Procedure(s): ARTHROPLASTY, HIP, TOTAL, ANTERIOR APPROACH on 07/26/2023   Consultants (if any):   Discharged Condition: Improved  Hospital Course: Jaime Rose is an 73 y.o. female who was admitted 07/26/2023 with a diagnosis of Unilateral primary osteoarthritis, right hip and went to the operating room on 07/26/2023 and underwent the above named procedures.    She was given perioperative antibiotics:  Anti-infectives (From admission, onward)    Start     Dose/Rate Route Frequency Ordered Stop   07/26/23 1900  ceFAZolin (ANCEF) IVPB 2g/100 mL premix        2 g 200 mL/hr over 30 Minutes Intravenous Every 6 hours 07/26/23 1600 07/27/23 0140   07/26/23 1030  ceFAZolin (ANCEF) IVPB 2g/100 mL premix        2 g 200 mL/hr over 30 Minutes Intravenous On call to O.R. 07/26/23 1019 07/26/23 1255     .  She was given sequential compression devices, early ambulation, and appropriate chemoprophylaxis for DVT prophylaxis.  She benefited maximally from the hospital stay and there were no complications.    Recent vital signs:  Vitals:   07/27/23 2313 07/28/23 0607  BP: (!) 120/54 131/61  Pulse: 79 86  Resp: 17 17  Temp: 98.4 F (36.9 C) 98.7 F (37.1 C)  SpO2: 95% 94%     Recent laboratory studies:  Lab Results  Component Value Date   HGB 10.2 (L) 07/27/2023   HGB 12.4 07/22/2023   HGB 12.7 05/24/2023   Lab Results  Component Value Date   WBC 8.9 07/27/2023   PLT 276 07/27/2023   No results found for: "INR" Lab Results  Component Value Date   NA 134 (L) 07/27/2023   K 3.6 07/27/2023   CL 100 07/27/2023   CO2 25 07/27/2023   BUN 11 07/27/2023   CREATININE 0.66 07/27/2023   GLUCOSE 133 (H) 07/27/2023    Discharge Medications:   Allergies as of 07/28/2023       Reactions   Prednisone Other (See Comments)   Very agitated and near psychotic per patient-this was several years ago-not exactly sure if was prednisone or different steroid ALL STEROIDS   Orange Fruit [citrus] Hives   Juice       Clobetasol Propionate Other (See Comments)   anxious        Medication List     TAKE these medications    acetaminophen 650 MG CR tablet Commonly known as: TYLENOL Take 1,300 mg by mouth 2 (two) times daily.   aspirin 81 MG chewable tablet Chew 1 tablet (81 mg total) by mouth 2 (two) times daily.   cholecalciferol 25 MCG (1000 UNIT) tablet Commonly known as: VITAMIN D3 Take 1,000 Units by  mouth daily.   levothyroxine 112 MCG tablet Commonly known as: SYNTHROID Take 1 tablet (112 mcg) by mouth daily before breakfast 6 days a week and 2 tablets (224 mcg ) by mouth one day a week.   loratadine 10 MG tablet Commonly known as: CLARITIN Take 10 mg by mouth daily.   methocarbamol 500 MG tablet Commonly known as: ROBAXIN Take 1 tablet (500 mg total) by mouth every 6 (six) hours as needed for muscle spasms.   oxyCODONE 5 MG immediate release tablet Commonly known as: Oxy IR/ROXICODONE Take 1-2 tablets (5-10 mg total) by mouth every 6 (six) hours as needed for moderate pain (pain score 4-6) (pain score 4-6).   Restasis 0.05 % ophthalmic emulsion Generic drug: cycloSPORINE Place 1 drop into both eyes 2 (two) times daily.   Viactiv  Calcium Plus D 650-12.5-40 MG-MCG Chew Generic drug: Calcium-Vitamin D-Vitamin K Chew 1 Piece by mouth daily.               Durable Medical Equipment  (From admission, onward)           Start     Ordered   07/26/23 1606  DME Walker rolling  Once       Question Answer Comment  Walker: With 5 Inch Wheels   Patient needs a walker to treat with the following condition Status post total replacement of right hip      07/26/23 1606   07/26/23 1606  DME 3 n 1  Once        07/26/23 1606            Diagnostic Studies: DG Pelvis Portable Result Date: 07/26/2023 CLINICAL DATA:  Status post right hip replacement EXAM: PORTABLE PELVIS 1-2 VIEWS COMPARISON:  None Available. FINDINGS: Right hip arthroplasty in expected alignment. No periprosthetic lucency or fracture. Recent postsurgical change includes air and edema in the soft tissues. Lateral skin staples in place. IMPRESSION: Right hip arthroplasty without immediate postoperative complication. Electronically Signed   By: Narda Rutherford M.D.   On: 07/26/2023 15:20   DG HIP UNILAT WITH PELVIS 1V RIGHT Result Date: 07/26/2023 CLINICAL DATA:  Elective surgery. EXAM: DG HIP (WITH OR WITHOUT PELVIS) 1V RIGHT COMPARISON:  None Available. FINDINGS: Four fluoroscopic spot views of the pelvis and right hip obtained in the operating room. Images during hip arthroplasty. Fluoroscopy time 29 seconds. Dose 2.85 mGy. IMPRESSION: Intraoperative fluoroscopy during right hip arthroplasty. Electronically Signed   By: Narda Rutherford M.D.   On: 07/26/2023 14:55   DG C-Arm 1-60 Min-No Report Result Date: 07/26/2023 Fluoroscopy was utilized by the requesting physician.  No radiographic interpretation.   DG C-Arm 1-60 Min-No Report Result Date: 07/26/2023 Fluoroscopy was utilized by the requesting physician.  No radiographic interpretation.   VAS US CAROTID Result Date: 07/10/2023 Carotid Arterial Duplex Study Patient Name:  DARIEL PELLECCHIA [facility]  Date  of Exam:   07/10/2023 Medical Rec #: [id number]           Accession #:    [id number] Date of Birth: 1950/06/09           Patient Gender: F Patient Age:   54 years Exam Location:  [facility] Procedure:      VAS US CAROTID Referring Phys: Kennyth Arnold BURNS --------------------------------------------------------------------------------  Indications:       Carotid artery disease and patient denies any cerebrovascular                    symptoms. Risk Factors:  Past history of smoking. Comparison Study:  01/16/2017 unable to locate results Performing Technologist: Jeryl Columbia RDCS  Examination Guidelines: A complete evaluation includes B-mode imaging, spectral Doppler, color Doppler, and power Doppler as needed of all accessible portions of each vessel. Bilateral testing is considered an integral part of a complete examination. Limited examinations for reoccurring indications may be performed as noted.  Right Carotid Findings: +----------+--------+--------+--------+------------------+--------+           PSV cm/sEDV cm/sStenosisPlaque DescriptionComments +----------+--------+--------+--------+------------------+--------+ CCA Prox  90      22                                         +----------+--------+--------+--------+------------------+--------+ CCA Mid   68      17                                         +----------+--------+--------+--------+------------------+--------+ CCA Distal63      19                                         +----------+--------+--------+--------+------------------+--------+ ICA Prox  34      9                                          +----------+--------+--------+--------+------------------+--------+ ICA Mid   57      20                                         +----------+--------+--------+--------+------------------+--------+ ICA Distal66      16                                          +----------+--------+--------+--------+------------------+--------+ ECA       48      9                                          +----------+--------+--------+--------+------------------+--------+ +----------+--------+-------+----------------+-------------------+           PSV cm/sEDV cmsDescribe        Arm Pressure (mmHG) +----------+--------+-------+----------------+-------------------+ [id number]      10     Multiphasic, [id number]                 +----------+--------+-------+----------------+-------------------+ +---------+--------+--+--------+--+---------+ VertebralPSV cm/s38EDV cm/s10Antegrade +---------+--------+--+--------+--+---------+  Left Carotid Findings: +----------+--------+--------+--------+------------------+--------+           PSV cm/sEDV cm/sStenosisPlaque DescriptionComments +----------+--------+--------+--------+------------------+--------+ CCA Prox  94      27                                         +----------+--------+--------+--------+------------------+--------+ CCA Mid   71      20                                         +----------+--------+--------+--------+------------------+--------+  CCA Distal66      21                                         +----------+--------+--------+--------+------------------+--------+ ICA Prox  82      15                                         +----------+--------+--------+--------+------------------+--------+ ICA Mid   70      22                                         +----------+--------+--------+--------+------------------+--------+ ICA Distal80      22                                         +----------+--------+--------+--------+------------------+--------+ ECA       67      10                                         +----------+--------+--------+--------+------------------+--------+ +----------+--------+--------+----------------+-------------------+           PSV cm/sEDV  cm/sDescribe        Arm Pressure (mmHG) +----------+--------+--------+----------------+-------------------+ [id number]     21      Multiphasic, WNL150                 +----------+--------+--------+----------------+-------------------+ +---------+--------+--+--------+--+---------+ VertebralPSV cm/s58EDV cm/s13Antegrade +---------+--------+--+--------+--+---------+   Summary: Right Carotid: The extracranial vessels were near-normal with only minimal wall                thickening or plaque. Left Carotid: The extracranial vessels were near-normal with only minimal wall               thickening or plaque. Vertebrals:  Bilateral vertebral arteries demonstrate antegrade flow. Subclavians: Normal flow hemodynamics were seen in bilateral subclavian              arteries. *See table(s) above for measurements and observations.  Electronically signed by Dina Rich MD on 07/10/2023 at 1:52:00 PM.    Final    ECHOCARDIOGRAM COMPLETE Result Date: 07/10/2023    ECHOCARDIOGRAM REPORT   Patient Name:   KAISLEE CHAO [facility] Date of Exam: 07/10/2023 Medical Rec #:  [id number]          Height:       64.0 in Accession #:    [id number]         Weight:       165.0 lb Date of Birth:  17-May-1950          BSA:          1.803 m Patient Age:    72 years           BP:           150/80 mmHg Patient Gender: F                  HR:           64 bpm. Exam Location:  Outpatient Procedure: 2D Echo, 3D  Echo, Cardiac Doppler, Color Doppler and Strain Analysis            (Both Spectral and Color Flow Doppler were utilized during            procedure). Indications:    R01.1 Murmur  History:        Patient has no prior history of Echocardiogram examinations.                 Signs/Symptoms:Murmur; Risk Factors:Former Smoker.  Sonographer:    Jeryl Columbia RDCS Referring Phys: [id number] STACY J BURNS IMPRESSIONS  1. At least moderate MR, possibly severe. The LA is severely dilated. Mechanism is likely related to restricted PMVL.  Consider TEE for definitive evaluation of mechanism and severity of MR. The mitral valve is grossly normal. Moderate to severe mitral valve regurgitation. No evidence of mitral stenosis.  2. Left ventricular ejection fraction, by estimation, is 55 to 60%. Left ventricular ejection fraction by 3D volume is 58 %. The left ventricle has normal function. The left ventricle has no regional wall motion abnormalities. Left ventricular diastolic  parameters were normal.  3. Right ventricular systolic function is normal. The right ventricular size is normal. Tricuspid regurgitation signal is inadequate for assessing PA pressure.  4. Left atrial size was severely dilated.  5. The aortic valve is tricuspid. Aortic valve regurgitation is not visualized. Aortic valve sclerosis is present, with no evidence of aortic valve stenosis.  6. The inferior vena cava is normal in size with greater than 50% respiratory variability, suggesting right atrial pressure of 3 mmHg. FINDINGS  Left Ventricle: Left ventricular ejection fraction, by estimation, is 55 to 60%. Left ventricular ejection fraction by 3D volume is 58 %. The left ventricle has normal function. The left ventricle has no regional wall motion abnormalities. Global longitudinal strain performed but not reported based on interpreter judgement due to suboptimal tracking. The left ventricular internal cavity size was normal in size. There is no left ventricular hypertrophy. Left ventricular diastolic parameters were normal. Right Ventricle: The right ventricular size is normal. No increase in right ventricular wall thickness. Right ventricular systolic function is normal. Tricuspid regurgitation signal is inadequate for assessing PA pressure. Left Atrium: Left atrial size was severely dilated. Right Atrium: Right atrial size was normal in size. Pericardium: There is no evidence of pericardial effusion. Mitral Valve: At least moderate MR, possibly severe. The LA is severely  dilated. Mechanism is likely related to restricted PMVL. Consider TEE for definitive evaluation of mechanism and severity of MR. The mitral valve is grossly normal. Moderate to severe mitral valve regurgitation. No evidence of mitral valve stenosis. Tricuspid Valve: The tricuspid valve is grossly normal. Tricuspid valve regurgitation is trivial. No evidence of tricuspid stenosis. Aortic Valve: The aortic valve is tricuspid. Aortic valve regurgitation is not visualized. Aortic valve sclerosis is present, with no evidence of aortic valve stenosis. Pulmonic Valve: The pulmonic valve was grossly normal. Pulmonic valve regurgitation is not visualized. No evidence of pulmonic stenosis. Aorta: The aortic root and ascending aorta are structurally normal, with no evidence of dilitation. Venous: The right lower pulmonary vein is normal. The inferior vena cava is normal in size with greater than 50% respiratory variability, suggesting right atrial pressure of 3 mmHg. IAS/Shunts: The atrial septum is grossly normal. Additional Comments: 3D was performed not requiring image post processing on an independent workstation and was normal.  LEFT VENTRICLE PLAX 2D LVIDd:         5.11 cm  Diastology LVIDs:         3.18 cm         LV e' medial:    7.72 cm/s LV PW:         0.95 cm         LV E/e' medial:  13.0 LV IVS:        0.87 cm         LV e' lateral:   6.96 cm/s LVOT diam:     2.00 cm         LV E/e' lateral: 14.4 LV SV:         70 LV SV Index:   39              2D Longitudinal LVOT Area:     3.14 cm        Strain                                2D Strain GLS   18.1 %                                (A4C):                                2D Strain GLS   15.8 %                                (A3C):                                2D Strain GLS   19.4 %                                (A2C):                                2D Strain GLS   17.7 %                                Avg:                                 3D Volume EF                                 LV 3D EF:    Left                                             ventricul                                             ar  ejection                                             fraction                                             by 3D                                             volume is                                             58 %.                                 3D Volume EF:                                3D EF:        58 %                                LV EDV:       134 ml                                LV ESV:       56 ml                                LV SV:        77 ml RIGHT VENTRICLE RV Basal diam:  2.80 cm RV Mid diam:    2.22 cm RV S prime:     11.90 cm/s TAPSE (M-mode): 2.7 cm LEFT ATRIUM              Index        RIGHT ATRIUM           Index LA diam:        4.50 cm  2.50 cm/m   RA Area:     16.20 cm LA Vol (A2C):   88.8 ml  49.26 ml/m  RA Volume:   37.50 ml  20.80 ml/m LA Vol (A4C):   112.0 ml 62.13 ml/m LA Biplane Vol: 102.0 ml 56.58 ml/m  AORTIC VALVE LVOT Vmax:   100.00 cm/s LVOT Vmean:  64.800 cm/s LVOT VTI:    0.222 m  AORTA Ao Root diam: 2.80 cm MITRAL VALVE MV Area (PHT): 4.80 cm       SHUNTS MV Decel Time: 158 msec       Systemic VTI:  0.22 m MR Peak grad:    135.0 mmHg   Systemic Diam: 2.00 cm MR Mean grad:    98.0 mmHg MR Vmax:  581.00 cm/s MR Vmean:        470.0 cm/s MR PISA:         2.26 cm MR PISA Eff ROA: 12 mm MR PISA Radius:  0.60 cm MV E velocity: 100.00 cm/s MV A velocity: 112.00 cm/s MV E/A ratio:  0.89 Lennie Odor MD Electronically signed by Lennie Odor MD Signature Date/Time: 07/10/2023/11:55:45 AM    Final     Disposition:      Follow-up Information     Kathryne Hitch, MD Follow up in 2 week(s).   Specialty: Orthopedic Surgery Contact information: [address] [place] [address] [phone]                  Signed: Huel Cote 07/28/2023, 7:41 AM

## 2023-07-29 ENCOUNTER — Encounter (HOSPITAL_COMMUNITY): Payer: Self-pay | Admitting: Orthopaedic Surgery

## 2023-07-30 ENCOUNTER — Telehealth: Payer: Self-pay | Admitting: Orthopaedic Surgery

## 2023-07-30 DIAGNOSIS — E0590 Thyrotoxicosis, unspecified without thyrotoxic crisis or storm: Secondary | ICD-10-CM | POA: Diagnosis not present

## 2023-07-30 DIAGNOSIS — J302 Other seasonal allergic rhinitis: Secondary | ICD-10-CM | POA: Diagnosis not present

## 2023-07-30 DIAGNOSIS — R7303 Prediabetes: Secondary | ICD-10-CM | POA: Diagnosis not present

## 2023-07-30 DIAGNOSIS — I8393 Asymptomatic varicose veins of bilateral lower extremities: Secondary | ICD-10-CM | POA: Diagnosis not present

## 2023-07-30 DIAGNOSIS — Z471 Aftercare following joint replacement surgery: Secondary | ICD-10-CM | POA: Diagnosis not present

## 2023-07-30 DIAGNOSIS — E039 Hypothyroidism, unspecified: Secondary | ICD-10-CM | POA: Diagnosis not present

## 2023-07-30 DIAGNOSIS — B019 Varicella without complication: Secondary | ICD-10-CM | POA: Diagnosis not present

## 2023-07-30 DIAGNOSIS — I051 Rheumatic mitral insufficiency: Secondary | ICD-10-CM | POA: Diagnosis not present

## 2023-07-30 DIAGNOSIS — I6523 Occlusion and stenosis of bilateral carotid arteries: Secondary | ICD-10-CM | POA: Diagnosis not present

## 2023-07-30 NOTE — Telephone Encounter (Signed)
 Verbal order given

## 2023-07-30 NOTE — Telephone Encounter (Signed)
 PT Jaime Rose call back [phone]. You can leave a vm if he does answer  He is requesting verbal orders   2weeks-2

## 2023-08-01 DIAGNOSIS — E0590 Thyrotoxicosis, unspecified without thyrotoxic crisis or storm: Secondary | ICD-10-CM | POA: Diagnosis not present

## 2023-08-01 DIAGNOSIS — I051 Rheumatic mitral insufficiency: Secondary | ICD-10-CM | POA: Diagnosis not present

## 2023-08-01 DIAGNOSIS — I8393 Asymptomatic varicose veins of bilateral lower extremities: Secondary | ICD-10-CM | POA: Diagnosis not present

## 2023-08-01 DIAGNOSIS — J302 Other seasonal allergic rhinitis: Secondary | ICD-10-CM | POA: Diagnosis not present

## 2023-08-01 DIAGNOSIS — B019 Varicella without complication: Secondary | ICD-10-CM | POA: Diagnosis not present

## 2023-08-01 DIAGNOSIS — Z471 Aftercare following joint replacement surgery: Secondary | ICD-10-CM | POA: Diagnosis not present

## 2023-08-01 DIAGNOSIS — R7303 Prediabetes: Secondary | ICD-10-CM | POA: Diagnosis not present

## 2023-08-01 DIAGNOSIS — I6523 Occlusion and stenosis of bilateral carotid arteries: Secondary | ICD-10-CM | POA: Diagnosis not present

## 2023-08-01 DIAGNOSIS — E039 Hypothyroidism, unspecified: Secondary | ICD-10-CM | POA: Diagnosis not present

## 2023-08-02 DIAGNOSIS — R7303 Prediabetes: Secondary | ICD-10-CM | POA: Diagnosis not present

## 2023-08-02 DIAGNOSIS — Z471 Aftercare following joint replacement surgery: Secondary | ICD-10-CM | POA: Diagnosis not present

## 2023-08-02 DIAGNOSIS — I6523 Occlusion and stenosis of bilateral carotid arteries: Secondary | ICD-10-CM | POA: Diagnosis not present

## 2023-08-02 DIAGNOSIS — I8393 Asymptomatic varicose veins of bilateral lower extremities: Secondary | ICD-10-CM | POA: Diagnosis not present

## 2023-08-02 DIAGNOSIS — E0590 Thyrotoxicosis, unspecified without thyrotoxic crisis or storm: Secondary | ICD-10-CM | POA: Diagnosis not present

## 2023-08-02 DIAGNOSIS — J302 Other seasonal allergic rhinitis: Secondary | ICD-10-CM | POA: Diagnosis not present

## 2023-08-02 DIAGNOSIS — I051 Rheumatic mitral insufficiency: Secondary | ICD-10-CM | POA: Diagnosis not present

## 2023-08-02 DIAGNOSIS — E039 Hypothyroidism, unspecified: Secondary | ICD-10-CM | POA: Diagnosis not present

## 2023-08-02 DIAGNOSIS — B019 Varicella without complication: Secondary | ICD-10-CM | POA: Diagnosis not present

## 2023-08-05 DIAGNOSIS — B019 Varicella without complication: Secondary | ICD-10-CM | POA: Diagnosis not present

## 2023-08-05 DIAGNOSIS — E039 Hypothyroidism, unspecified: Secondary | ICD-10-CM | POA: Diagnosis not present

## 2023-08-05 DIAGNOSIS — Z471 Aftercare following joint replacement surgery: Secondary | ICD-10-CM | POA: Diagnosis not present

## 2023-08-05 DIAGNOSIS — R7303 Prediabetes: Secondary | ICD-10-CM | POA: Diagnosis not present

## 2023-08-05 DIAGNOSIS — I6523 Occlusion and stenosis of bilateral carotid arteries: Secondary | ICD-10-CM | POA: Diagnosis not present

## 2023-08-05 DIAGNOSIS — J302 Other seasonal allergic rhinitis: Secondary | ICD-10-CM | POA: Diagnosis not present

## 2023-08-05 DIAGNOSIS — E0590 Thyrotoxicosis, unspecified without thyrotoxic crisis or storm: Secondary | ICD-10-CM | POA: Diagnosis not present

## 2023-08-05 DIAGNOSIS — I8393 Asymptomatic varicose veins of bilateral lower extremities: Secondary | ICD-10-CM | POA: Diagnosis not present

## 2023-08-05 DIAGNOSIS — I051 Rheumatic mitral insufficiency: Secondary | ICD-10-CM | POA: Diagnosis not present

## 2023-08-07 DIAGNOSIS — J302 Other seasonal allergic rhinitis: Secondary | ICD-10-CM | POA: Diagnosis not present

## 2023-08-07 DIAGNOSIS — I8393 Asymptomatic varicose veins of bilateral lower extremities: Secondary | ICD-10-CM | POA: Diagnosis not present

## 2023-08-07 DIAGNOSIS — Z471 Aftercare following joint replacement surgery: Secondary | ICD-10-CM | POA: Diagnosis not present

## 2023-08-07 DIAGNOSIS — I6523 Occlusion and stenosis of bilateral carotid arteries: Secondary | ICD-10-CM | POA: Diagnosis not present

## 2023-08-07 DIAGNOSIS — E0590 Thyrotoxicosis, unspecified without thyrotoxic crisis or storm: Secondary | ICD-10-CM | POA: Diagnosis not present

## 2023-08-07 DIAGNOSIS — I051 Rheumatic mitral insufficiency: Secondary | ICD-10-CM | POA: Diagnosis not present

## 2023-08-07 DIAGNOSIS — B019 Varicella without complication: Secondary | ICD-10-CM | POA: Diagnosis not present

## 2023-08-07 DIAGNOSIS — R7303 Prediabetes: Secondary | ICD-10-CM | POA: Diagnosis not present

## 2023-08-07 DIAGNOSIS — E039 Hypothyroidism, unspecified: Secondary | ICD-10-CM | POA: Diagnosis not present

## 2023-08-08 ENCOUNTER — Ambulatory Visit: Payer: Medicare PPO | Admitting: Orthopaedic Surgery

## 2023-08-08 ENCOUNTER — Encounter: Payer: Self-pay | Admitting: Orthopaedic Surgery

## 2023-08-08 DIAGNOSIS — Z471 Aftercare following joint replacement surgery: Secondary | ICD-10-CM | POA: Diagnosis not present

## 2023-08-08 DIAGNOSIS — R7303 Prediabetes: Secondary | ICD-10-CM | POA: Diagnosis not present

## 2023-08-08 DIAGNOSIS — E039 Hypothyroidism, unspecified: Secondary | ICD-10-CM | POA: Diagnosis not present

## 2023-08-08 DIAGNOSIS — J302 Other seasonal allergic rhinitis: Secondary | ICD-10-CM | POA: Diagnosis not present

## 2023-08-08 DIAGNOSIS — E0590 Thyrotoxicosis, unspecified without thyrotoxic crisis or storm: Secondary | ICD-10-CM | POA: Diagnosis not present

## 2023-08-08 DIAGNOSIS — M1612 Unilateral primary osteoarthritis, left hip: Secondary | ICD-10-CM | POA: Diagnosis not present

## 2023-08-08 DIAGNOSIS — I051 Rheumatic mitral insufficiency: Secondary | ICD-10-CM | POA: Diagnosis not present

## 2023-08-08 DIAGNOSIS — I8393 Asymptomatic varicose veins of bilateral lower extremities: Secondary | ICD-10-CM | POA: Diagnosis not present

## 2023-08-08 DIAGNOSIS — Z96641 Presence of right artificial hip joint: Secondary | ICD-10-CM

## 2023-08-08 DIAGNOSIS — B019 Varicella without complication: Secondary | ICD-10-CM | POA: Diagnosis not present

## 2023-08-08 DIAGNOSIS — I6523 Occlusion and stenosis of bilateral carotid arteries: Secondary | ICD-10-CM | POA: Diagnosis not present

## 2023-08-08 NOTE — Progress Notes (Signed)
 The patient is here today for first postoperative visit status post a right total hip replacement.  She has been wearing compressive hose.  She has been compliant with a baby aspirin twice daily and she is already weaned to Tylenol.  Her daughter is with her today as well.  She does ambulate with a cane.  On exam her right hip incision looks good.  The staples were removed and Steri-Strips applied.  Her calf is soft.  She will slowly increase her activities as comfort allows.  She knows to wait at least 2-3 more weeks before submerging underwater.  She can drive from my standpoint and can stop her aspirin but I am okay with her continuing aspirin for another month.  We will see her back in a month to see how she is doing overall but no x-rays are needed.  If there are issues before then she knows to let us know.

## 2023-08-12 ENCOUNTER — Other Ambulatory Visit: Payer: Self-pay

## 2023-08-12 ENCOUNTER — Encounter (HOSPITAL_BASED_OUTPATIENT_CLINIC_OR_DEPARTMENT_OTHER): Payer: Self-pay | Admitting: Physical Therapy

## 2023-08-12 ENCOUNTER — Ambulatory Visit (HOSPITAL_BASED_OUTPATIENT_CLINIC_OR_DEPARTMENT_OTHER): Attending: Orthopaedic Surgery | Admitting: Physical Therapy

## 2023-08-12 DIAGNOSIS — M25512 Pain in left shoulder: Secondary | ICD-10-CM | POA: Insufficient documentation

## 2023-08-12 DIAGNOSIS — Z96641 Presence of right artificial hip joint: Secondary | ICD-10-CM | POA: Diagnosis not present

## 2023-08-12 DIAGNOSIS — R262 Difficulty in walking, not elsewhere classified: Secondary | ICD-10-CM | POA: Diagnosis not present

## 2023-08-12 DIAGNOSIS — M25551 Pain in right hip: Secondary | ICD-10-CM | POA: Diagnosis not present

## 2023-08-12 DIAGNOSIS — G8929 Other chronic pain: Secondary | ICD-10-CM | POA: Insufficient documentation

## 2023-08-12 NOTE — Therapy (Signed)
 OUTPATIENT PHYSICAL THERAPY EVALUATION   Patient Name: Jaime Rose MRN: [id number] DOB:[birth year], 73 y.o., female Today's Date: 08/12/2023  END OF SESSION:  PT End of Session - 08/12/23 1526     Visit Number 1    Number of Visits 17    Date for PT Re-Evaluation 10/12/23    Authorization Type [employer] MCR    PT Start Time 1528    PT Stop Time 1611    PT Time Calculation (min) 43 min    Activity Tolerance Patient tolerated treatment well    Behavior During Therapy WFL for tasks assessed/performed             Past Medical History:  Diagnosis Date   ALLERGIC RHINITIS    Allergy 1985   seasonal allergies   Arthritis    CHICKENPOX, HX OF 12/21/2009   Qualifier: Diagnosis of  By: Myrtice Athens MD, Rufina Cough    Heart murmur    MR   Hyperthyroidism    normal TFTs 11/2009 , dx 10/2011   Pre-diabetes    Varicose vein    s/p sclerosis tx summer 2013   Past Surgical History:  Procedure Laterality Date   ENDOVENOUS ABLATION SAPHENOUS VEIN W/ LASER  04/11/2012   endovenous laser ablation right greater saphenous vein and stab phlebectomy  right leg  by Ouida Bloom MD   NO PAST SURGERIES     TOTAL HIP ARTHROPLASTY Right 07/26/2023   Procedure: ARTHROPLASTY, HIP, TOTAL, ANTERIOR APPROACH;  Surgeon: Arnie Lao, MD;  Location: [facility];  Service: Orthopedics;  Laterality: Right;   Patient Active Problem List   Diagnosis Date Noted   Status post total replacement of right hip 07/26/2023   Hyperlipidemia 07/17/2023   Elevated coronary artery calcium score 07/17/2023   Preoperative clearance 07/17/2023   Mitral regurgitation, moderate-possibly severe 07/11/2023   Vitamin D deficiency 05/23/2023   Foot pain, left 06/02/2021   Ptosis of right eyelid 04/12/2020   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 12/29/2018   Osteopenia 01/10/2017   Carotid artery stenosis, asymptomatic, bilateral 12/21/2016   Prediabetes 12/21/2016   Hypothyroidism 03/20/2012   Varicose veins of bilateral  lower extremities with other complications 12/21/2011   Allergic rhinitis 12/21/2009   Cardiac murmur 12/21/2009     REFERRING PROVIDER: Norberto Bear, MD  REFERRING DIAG:  [phone] (ICD-10-CM) - Status post total replacement of right hip     S/p Rt THA Rationale for Evaluation and Treatment: Rehabilitation  THERAPY DIAG:  Pain in right hip  Difficulty in walking, not elsewhere classified  ONSET DATE: DOS 07/26/23   SUBJECTIVE:  SUBJECTIVE STATEMENT: Took out staples last Thursday. Distal incision is red.   PERTINENT HISTORY:  none  PAIN:  Are you having pain? Yes: NPRS scale: 3 Pain location: Rt laeral hip Pain description: tight, tender Aggravating factors: walking Relieving factors: rest  PRECAUTIONS:  Anterior hip  RED FLAGS: None   WEIGHT BEARING RESTRICTIONS:  No  FALLS:  Has patient fallen in last 6 months? No  LIVING ENVIRONMENT: 4 steps to get into home, bedroom on main floor, has a second story.  OCCUPATION:  Next week to return to work   PLOF:  Independent  PATIENT GOALS:  Don my own sock and shoes. Preserve left knee to avoid TKA, play pickle ball   OBJECTIVE:  Note: Objective measures were completed at Evaluation unless otherwise noted.  PATIENT SURVEYS:  LEFS 28/80  COGNITIVE STATUS: Within functional limits for tasks assessed   SENSATION: WFL  EDEMA:  Yes: mild  POSTURE:  Lacking full hip extension bilaterally  GAIT: EVAL: antalgic lacking hip extension through stance phase/toe off   Body Part #1 Hip  PALPATION: Eval- edema and warmth  LOWER EXTREMITY ROM:     Active  Right eval Left eval  Hip flexion    Hip extension -12 -6  Hip abduction    Hip adduction    Hip internal rotation    Hip external rotation    Knee  flexion    Knee extension- seated 0 -8   (Blank rows = not tested)                                                                                                                                TREATMENT DATE:   4/14: Discussed home health HEP See HEP Gait with glut set at heel strike, using [facility]   PATIENT EDUCATION:  Education details: Anatomy of condition, POC, HEP, exercise form/rationale Person educated: Patient Education method: Explanation, Demonstration, Tactile cues, Verbal cues, and Handouts Education comprehension: verbalized understanding, returned demonstration, verbal cues required, tactile cues required, and needs further education  HOME EXERCISE PROGRAM: Access Code: [email or web address] URL: [email or web address] Date: 08/12/2023 Prepared by: Army Fossa  Exercises - Prone Press Up On Elbows  - 2 x daily - 7 x weekly - 2-3 min hold - Prone Gluteal Sets  - 2 x daily - 7 x weekly - 2 sets - 10 reps - Prone Knee Flexion  - 2 x daily - 7 x weekly - 2 sets - 10 reps - Seated Hamstring Stretch  - 3 x daily - 7 x weekly - 2 sets - 5 breaths hold - Seated Hip Flexion Toward Target  - 3 x daily - 7 x weekly - 1 sets - 5 reps - Seated Long Arc Quad  - 2 x daily - 7 x weekly - 2 sets - 10 reps - Standing Gluteal Sets  - Standing Gastroc Stretch  - 2 x daily - 7 x weekly - 2 sets -  5 breaths hold - Lateral Weight Shift with Parallel Bars (BKA)  - 3 x daily - 7 x weekly - 1 sets - 10 reps   ASSESSMENT:  CLINICAL IMPRESSION: Patient is a 73 y.o. F who was seen today for physical therapy evaluation and treatment for s/p Rt THA, anterior approach. Marked around red area of distal incision to monitor size.     REHAB POTENTIAL: Good  CLINICAL DECISION MAKING: Stable/uncomplicated  EVALUATION COMPLEXITY: Low   GOALS: Goals reviewed with patient? Yes  SHORT TERM GOALS: Target date: 08/31/23  Sit<>stand without use of UEs Baseline: Goal status:  INITIAL  2.  Standing hip hip to neutral extension Baseline:  Goal status: INITIAL    LONG TERM GOALS: Target date: POC date  Stand as lon as necessary for work with minimal discomfort Baseline:  Goal status: INITIAL  2.  LEFS to improve by MDC x2 Baseline:  Goal status: INITIAL  3.  Hip abd strength to 80% of opposite LE Baseline:  Goal status: INITIAL  4.  Easily don socks and shoes independently Baseline:  Goal status: INITIAL  5.  Begin training for return to pickle ball Baseline:  Goal status: INITIAL     PLAN:  PT FREQUENCY: 1-2x/week  PT DURATION: POC date  PLANNED INTERVENTIONS: 97164- PT Re-evaluation, 97750- Physical Performance Testing, 97110-Therapeutic exercises, 97530- Therapeutic activity, 97112- Neuromuscular re-education, 97535- Self Care, 78295- Manual therapy, [phone]- Gait training, [phone]- Aquatic Therapy, Patient/Family education, Balance training, Stair training, Taping, Dry Needling, Joint mobilization, Spinal mobilization, Scar mobilization, Cryotherapy, and Moist heat.  PLAN FOR NEXT SESSION: continue flexibility and strength to reduce post pelvic tilt.    Frankye Schwegel C. Peyson Delao PT, DPT 08/12/23 8:48 PM  Referring diagnosis?  I69.629 (ICD-10-CM) - Status post total replacement of right hip    Treatment diagnosis? (if different than referring diagnosis) M25.551 What was this (referring dx) caused by? [x]  Surgery []  Fall []  Ongoing issue []  Arthritis []  Other: ____________  Laterality: [x]  Rt []  Lt []  Both  Check all possible CPT codes:  *CHOOSE 10 OR LESS*    See Planned Interventions listed in the Plan section of the Evaluation.

## 2023-08-19 ENCOUNTER — Ambulatory Visit (HOSPITAL_BASED_OUTPATIENT_CLINIC_OR_DEPARTMENT_OTHER): Admitting: Physical Therapy

## 2023-08-19 ENCOUNTER — Telehealth: Payer: Self-pay | Admitting: *Deleted

## 2023-08-19 ENCOUNTER — Encounter (HOSPITAL_BASED_OUTPATIENT_CLINIC_OR_DEPARTMENT_OTHER): Payer: Self-pay | Admitting: Physical Therapy

## 2023-08-19 ENCOUNTER — Other Ambulatory Visit: Payer: Self-pay | Admitting: Orthopaedic Surgery

## 2023-08-19 DIAGNOSIS — M25551 Pain in right hip: Secondary | ICD-10-CM | POA: Diagnosis not present

## 2023-08-19 DIAGNOSIS — G8929 Other chronic pain: Secondary | ICD-10-CM | POA: Diagnosis not present

## 2023-08-19 DIAGNOSIS — Z96641 Presence of right artificial hip joint: Secondary | ICD-10-CM | POA: Diagnosis not present

## 2023-08-19 DIAGNOSIS — R262 Difficulty in walking, not elsewhere classified: Secondary | ICD-10-CM | POA: Diagnosis not present

## 2023-08-19 DIAGNOSIS — M25512 Pain in left shoulder: Secondary | ICD-10-CM | POA: Diagnosis not present

## 2023-08-19 MED ORDER — MUPIROCIN 2 % EX OINT: 1.0000 | TOPICAL_OINTMENT | Freq: Every day | CUTANEOUS | 0 refills | Status: DC

## 2023-08-19 NOTE — Telephone Encounter (Signed)
 Patient called asking about her incision and sent a picture (which you saw). She reports she thinks it drained slightly at the bottom of her hip incision, but the rest looks nice and closed. It is slightly reddened around where it looks a little open. She also asked about shaving her lower legs, which I told her is fine. She wanted to know when she could get a pedicure or nails trimmed (she has an ingrown toenail). Wait how long for this?

## 2023-08-19 NOTE — Therapy (Signed)
 OUTPATIENT PHYSICAL THERAPY EVALUATION   Patient Name: Jaime Rose MRN: [id number] DOB:January 27, 1951, 73 y.o., female Today's Date: 08/19/2023  END OF SESSION:  PT End of Session - 08/19/23 1405     Visit Number 2    Number of Visits 17    Date for PT Re-Evaluation 10/12/23    Authorization Type [employer] MCR    PT Start Time 1400    PT Stop Time 1442    PT Time Calculation (min) 42 min    Activity Tolerance Patient tolerated treatment well    Behavior During Therapy WFL for tasks assessed/performed             Past Medical History:  Diagnosis Date   ALLERGIC RHINITIS    Allergy 1985   seasonal allergies   Arthritis    CHICKENPOX, HX OF 12/21/2009   Qualifier: Diagnosis of  By: Myrtice Athens MD, Rufina Cough    Heart murmur    MR   Hyperthyroidism    normal TFTs 11/2009 , dx 10/2011   Pre-diabetes    Varicose vein    s/p sclerosis tx summer 2013   Past Surgical History:  Procedure Laterality Date   ENDOVENOUS ABLATION SAPHENOUS VEIN W/ LASER  04/11/2012   endovenous laser ablation right greater saphenous vein and stab phlebectomy  right leg  by Ouida Bloom MD   NO PAST SURGERIES     TOTAL HIP ARTHROPLASTY Right 07/26/2023   Procedure: ARTHROPLASTY, HIP, TOTAL, ANTERIOR APPROACH;  Surgeon: Arnie Lao, MD;  Location: [facility];  Service: Orthopedics;  Laterality: Right;   Patient Active Problem List   Diagnosis Date Noted   Status post total replacement of right hip 07/26/2023   Hyperlipidemia 07/17/2023   Elevated coronary artery calcium  score 07/17/2023   Preoperative clearance 07/17/2023   Mitral regurgitation, moderate-possibly severe 07/11/2023   Vitamin D  deficiency 05/23/2023   Foot pain, left 06/02/2021   Ptosis of right eyelid 04/12/2020   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 12/29/2018   Osteopenia 01/10/2017   Carotid artery stenosis, asymptomatic, bilateral 12/21/2016   Prediabetes 12/21/2016   Hypothyroidism 03/20/2012   Varicose veins of bilateral  lower extremities with other complications 12/21/2011   Allergic rhinitis 12/21/2009   Cardiac murmur 12/21/2009     REFERRING PROVIDER: Norberto Bear, MD  REFERRING DIAG:  [phone] (ICD-10-CM) - Status post total replacement of right hip     S/p Rt THA Rationale for Evaluation and Treatment: Rehabilitation  THERAPY DIAG:  Pain in right hip  Difficulty in walking, not elsewhere classified  ONSET DATE: DOS 07/26/23   SUBJECTIVE:  SUBJECTIVE STATEMENT: Lt knee is what is bothering me. Red area at distal incision has gotten smaller.   PERTINENT HISTORY:  none  PAIN:  Are you having pain? Yes: NPRS scale: 3 Pain location: Rt laeral hip Pain description: tight, tender Aggravating factors: walking Relieving factors: rest  PRECAUTIONS:  Anterior hip  RED FLAGS: None   WEIGHT BEARING RESTRICTIONS:  No  FALLS:  Has patient fallen in last 6 months? No  LIVING ENVIRONMENT: 4 steps to get into home, bedroom on main floor, has a second story.  OCCUPATION:  Next week to return to work   PLOF:  Independent  PATIENT GOALS:  Don my own sock and shoes. Preserve left knee to avoid TKA, play pickle ball   OBJECTIVE:  Note: Objective measures were completed at Evaluation unless otherwise noted.  PATIENT SURVEYS:  LEFS 28/80  COGNITIVE STATUS: Within functional limits for tasks assessed   SENSATION: WFL  EDEMA:  Yes: mild  POSTURE:  Lacking full hip extension bilaterally  GAIT: EVAL: antalgic lacking hip extension through stance phase/toe off   Body Part #1 Hip  PALPATION: Eval- edema and warmth  LOWER EXTREMITY ROM:     Active  Right eval Left eval  Hip flexion    Hip extension -12 -6  Hip abduction    Hip adduction    Hip internal rotation    Hip  external rotation    Knee flexion    Knee extension- seated 0 -8   (Blank rows = not tested)                                                                                                                                TREATMENT DATE:   4/21: [name] for patellar tracking Lunge with back heel raise + glut set Bilat heel raise with glut set Gastroc stretch Gait- heel/toe, glut & gastroc activation Sidelying roller to ITB, HS on left side Supine glut+quad set+ small SLR, alternating Seated HS stretch Seated hip hinge progressed to sit<>stand with forward reach  4/14: Discussed home health HEP See HEP Gait with glut set at heel strike, using [facility]   PATIENT EDUCATION:  Education details: Anatomy of condition, POC, HEP, exercise form/rationale Person educated: Patient Education method: Explanation, Demonstration, Tactile cues, Verbal cues, and Handouts Education comprehension: verbalized understanding, returned demonstration, verbal cues required, tactile cues required, and needs further education  HOME EXERCISE PROGRAM: Access Code: [email or web address] URL: [email or web address] Date: 08/12/2023 Prepared by: Keven Pel  Exercises - Prone Press Up On Elbows  - 2 x daily - 7 x weekly - 2-3 min hold - Prone Gluteal Sets  - 2 x daily - 7 x weekly - 2 sets - 10 reps - Prone Knee Flexion  - 2 x daily - 7 x weekly - 2 sets - 10 reps - Seated Hamstring Stretch  - 3 x daily - 7 x weekly - 2 sets - 5 breaths hold - Seated  Hip Flexion Toward Target  - 3 x daily - 7 x weekly - 1 sets - 5 reps - Seated Long Arc Quad  - 2 x daily - 7 x weekly - 2 sets - 10 reps - Standing Gluteal Sets  - Standing Gastroc Stretch  - 2 x daily - 7 x weekly - 2 sets - 5 breaths hold - Lateral Weight Shift with Parallel Bars (BKA)  - 3 x daily - 7 x weekly - 1 sets - 10 reps   ASSESSMENT:  CLINICAL IMPRESSION: Most notable limitation is in left knee. Progessing well and will work on adding  awareness of glutes/calves in walking.     REHAB POTENTIAL: Good  CLINICAL DECISION MAKING: Stable/uncomplicated  EVALUATION COMPLEXITY: Low   GOALS: Goals reviewed with patient? Yes  SHORT TERM GOALS: Target date: 08/31/23  Sit<>stand without use of UEs Baseline: Goal status: INITIAL  2.  Standing hip hip to neutral extension Baseline:  Goal status: INITIAL    LONG TERM GOALS: Target date: POC date  Stand as lon as necessary for work with minimal discomfort Baseline:  Goal status: INITIAL  2.  LEFS to improve by MDC x2 Baseline:  Goal status: INITIAL  3.  Hip abd strength to 80% of opposite LE Baseline:  Goal status: INITIAL  4.  Easily don socks and shoes independently Baseline:  Goal status: INITIAL  5.  Begin training for return to pickle ball Baseline:  Goal status: INITIAL     PLAN:  PT FREQUENCY: 1-2x/week  PT DURATION: POC date  PLANNED INTERVENTIONS: 97164- PT Re-evaluation, 97750- Physical Performance Testing, 97110-Therapeutic exercises, 97530- Therapeutic activity, 97112- Neuromuscular re-education, 97535- Self Care, 16109- Manual therapy, [phone]- Gait training, [phone]- Aquatic Therapy, Patient/Family education, Balance training, Stair training, Taping, Dry Needling, Joint mobilization, Spinal mobilization, Scar mobilization, Cryotherapy, and Moist heat.  PLAN FOR NEXT SESSION: continue flexibility and strength to reduce post pelvic tilt.    Katena Petitjean C. Sosie Gato PT, DPT 08/19/23 2:45 PM  Referring diagnosis?  B14.782 (ICD-10-CM) - Status post total replacement of right hip    Treatment diagnosis? (if different than referring diagnosis) M25.551 What was this (referring dx) caused by? [x]  Surgery []  Fall []  Ongoing issue []  Arthritis []  Other: ____________  Laterality: [x]  Rt []  Lt []  Both  Check all possible CPT codes:  *CHOOSE 10 OR LESS*    See Planned Interventions listed in the Plan section of the Evaluation.

## 2023-08-22 ENCOUNTER — Encounter: Payer: Medicare PPO | Admitting: Orthopaedic Surgery

## 2023-08-28 ENCOUNTER — Ambulatory Visit (HOSPITAL_BASED_OUTPATIENT_CLINIC_OR_DEPARTMENT_OTHER): Admitting: Physical Therapy

## 2023-08-28 ENCOUNTER — Encounter (HOSPITAL_BASED_OUTPATIENT_CLINIC_OR_DEPARTMENT_OTHER): Payer: Self-pay | Admitting: Physical Therapy

## 2023-08-28 DIAGNOSIS — R262 Difficulty in walking, not elsewhere classified: Secondary | ICD-10-CM

## 2023-08-28 DIAGNOSIS — M25551 Pain in right hip: Secondary | ICD-10-CM | POA: Diagnosis not present

## 2023-08-28 DIAGNOSIS — Z96641 Presence of right artificial hip joint: Secondary | ICD-10-CM | POA: Diagnosis not present

## 2023-08-28 DIAGNOSIS — M25512 Pain in left shoulder: Secondary | ICD-10-CM | POA: Diagnosis not present

## 2023-08-28 DIAGNOSIS — G8929 Other chronic pain: Secondary | ICD-10-CM | POA: Diagnosis not present

## 2023-08-28 NOTE — Therapy (Signed)
 OUTPATIENT PHYSICAL THERAPY EVALUATION   Patient Name: Jaime Rose MRN: [id number] DOB:March 11, 1951, 73 y.o., female Today's Date: 08/28/2023  END OF SESSION:  PT End of Session - 08/28/23 1324     Visit Number 3    Number of Visits 17    Date for PT Re-Evaluation 10/12/23    Authorization Type [employer] MCR    PT Start Time 1100    PT Stop Time 1143    PT Time Calculation (min) 43 min    Activity Tolerance Patient tolerated treatment well    Behavior During Therapy WFL for tasks assessed/performed              Past Medical History:  Diagnosis Date   ALLERGIC RHINITIS    Allergy 1985   seasonal allergies   Arthritis    CHICKENPOX, HX OF 12/21/2009   Qualifier: Diagnosis of  By: Myrtice Athens MD, Rufina Cough    Heart murmur    MR   Hyperthyroidism    normal TFTs 11/2009 , dx 10/2011   Pre-diabetes    Varicose vein    s/p sclerosis tx summer 2013   Past Surgical History:  Procedure Laterality Date   ENDOVENOUS ABLATION SAPHENOUS VEIN W/ LASER  04/11/2012   endovenous laser ablation right greater saphenous vein and stab phlebectomy  right leg  by Ouida Bloom MD   NO PAST SURGERIES     TOTAL HIP ARTHROPLASTY Right 07/26/2023   Procedure: ARTHROPLASTY, HIP, TOTAL, ANTERIOR APPROACH;  Surgeon: Arnie Lao, MD;  Location: [facility];  Service: Orthopedics;  Laterality: Right;   Patient Active Problem List   Diagnosis Date Noted   Status post total replacement of right hip 07/26/2023   Hyperlipidemia 07/17/2023   Elevated coronary artery calcium  score 07/17/2023   Preoperative clearance 07/17/2023   Mitral regurgitation, moderate-possibly severe 07/11/2023   Vitamin D  deficiency 05/23/2023   Foot pain, left 06/02/2021   Ptosis of right eyelid 04/12/2020   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 12/29/2018   Osteopenia 01/10/2017   Carotid artery stenosis, asymptomatic, bilateral 12/21/2016   Prediabetes 12/21/2016   Hypothyroidism 03/20/2012   Varicose veins of bilateral  lower extremities with other complications 12/21/2011   Allergic rhinitis 12/21/2009   Cardiac murmur 12/21/2009     REFERRING PROVIDER: Norberto Bear, MD  REFERRING DIAG:  [phone] (ICD-10-CM) - Status post total replacement of right hip     S/p Rt THA Rationale for Evaluation and Treatment: Rehabilitation  THERAPY DIAG:  Pain in right hip  Difficulty in walking, not elsewhere classified  ONSET DATE: DOS 07/26/23   SUBJECTIVE:  SUBJECTIVE STATEMENT: The patient reports that her left knee is hurting her today. She is going to the beach   PERTINENT HISTORY:  none  PAIN:  Are you having pain? Yes: NPRS scale: 3 Pain location: Rt laeral hip Pain description: tight, tender Aggravating factors: walking Relieving factors: rest  PRECAUTIONS:  Anterior hip  RED FLAGS: None   WEIGHT BEARING RESTRICTIONS:  No  FALLS:  Has patient fallen in last 6 months? No  LIVING ENVIRONMENT: 4 steps to get into home, bedroom on main floor, has a second story.  OCCUPATION:  Next week to return to work   PLOF:  Independent  PATIENT GOALS:  Don my own sock and shoes. Preserve left knee to avoid TKA, play pickle ball   OBJECTIVE:  Note: Objective measures were completed at Evaluation unless otherwise noted.  PATIENT SURVEYS:  LEFS 28/80  COGNITIVE STATUS: Within functional limits for tasks assessed   SENSATION: WFL  EDEMA:  Yes: mild  POSTURE:  Lacking full hip extension bilaterally  GAIT: EVAL: antalgic lacking hip extension through stance phase/toe off   Body Part #1 Hip  PALPATION: Eval- edema and warmth  LOWER EXTREMITY ROM:     Active  Right eval Left eval  Hip flexion    Hip extension -12 -6  Hip abduction    Hip adduction    Hip internal rotation     Hip external rotation    Knee flexion    Knee extension- seated 0 -8   (Blank rows = not tested)                                                                                                                                TREATMENT DATE:  4/30 Manual: Trigger point release to right  anterior hip.  Grade II and III Posterior and anterior glides to the left knee  IT band release on the left   There-ex:  Hip ER PROM  LAQ 2x10 red  Hip abduction 2x15 red felt it in the right knee a little  Neuro-re-ed: Standing heel raise with cuing for posture 2x15   4/21: Ktape for patellar tracking Lunge with back heel raise + glut set Bilat heel raise with glut set Gastroc stretch Gait- heel/toe, glut & gastroc activation Sidelying roller to ITB, HS on left side Supine glut+quad set+ small SLR, alternating Seated HS stretch Seated hip hinge progressed to sit<>stand with forward reach  4/14: Discussed home health HEP See HEP Gait with glut set at heel strike, using [facility]   PATIENT EDUCATION:  Education details: Anatomy of condition, POC, HEP, exercise form/rationale Person educated: Patient Education method: Explanation, Demonstration, Tactile cues, Verbal cues, and Handouts Education comprehension: verbalized understanding, returned demonstration, verbal cues required, tactile cues required, and needs further education  HOME EXERCISE PROGRAM: Access Code: [email or web address] URL: [email or web address] Date: 08/12/2023 Prepared by: Keven Pel  Exercises - Prone Press Up On Elbows  - 2 x daily -  7 x weekly - 2-3 min hold - Prone Gluteal Sets  - 2 x daily - 7 x weekly - 2 sets - 10 reps - Prone Knee Flexion  - 2 x daily - 7 x weekly - 2 sets - 10 reps - Seated Hamstring Stretch  - 3 x daily - 7 x weekly - 2 sets - 5 breaths hold - Seated Hip Flexion Toward Target  - 3 x daily - 7 x weekly - 1 sets - 5 reps - Seated Long Arc Quad  - 2 x daily - 7 x weekly - 2 sets -  10 reps - Standing Gluteal Sets  - Standing Gastroc Stretch  - 2 x daily - 7 x weekly - 2 sets - 5 breaths hold - Lateral Weight Shift with Parallel Bars (BKA)  - 3 x daily - 7 x weekly - 1 sets - 10 reps   ASSESSMENT:  CLINICAL IMPRESSION: The patient was tight in her anterior hip today. It loosened significantly after manual therapy. We also performed manual therapy to her left knee with an improvement in knee flexion following. We worked on loading the hip with bands today. She had no significant increase in pain. She will be going to the beach this weekend. Therapy will progress as tolerated.     REHAB POTENTIAL: Good  CLINICAL DECISION MAKING: Stable/uncomplicated  EVALUATION COMPLEXITY: Low   GOALS: Goals reviewed with patient? Yes  SHORT TERM GOALS: Target date: 08/31/23  Sit<>stand without use of UEs Baseline: Goal status: INITIAL  2.  Standing hip hip to neutral extension Baseline:  Goal status: INITIAL    LONG TERM GOALS: Target date: POC date  Stand as lon as necessary for work with minimal discomfort Baseline:  Goal status: INITIAL  2.  LEFS to improve by MDC x2 Baseline:  Goal status: INITIAL  3.  Hip abd strength to 80% of opposite LE Baseline:  Goal status: INITIAL  4.  Easily don socks and shoes independently Baseline:  Goal status: INITIAL  5.  Begin training for return to pickle ball Baseline:  Goal status: INITIAL     PLAN:  PT FREQUENCY: 1-2x/week  PT DURATION: POC date  PLANNED INTERVENTIONS: 97164- PT Re-evaluation, 97750- Physical Performance Testing, 97110-Therapeutic exercises, 97530- Therapeutic activity, 97112- Neuromuscular re-education, 97535- Self Care, 02725- Manual therapy, [phone]- Gait training, [phone]- Aquatic Therapy, Patient/Family education, Balance training, Stair training, Taping, Dry Needling, Joint mobilization, Spinal mobilization, Scar mobilization, Cryotherapy, and Moist heat.  PLAN FOR NEXT SESSION: continue  flexibility and strength to reduce post pelvic tilt.    Jessica C. Hightower PT, DPT 08/28/23 1:25 PM  Referring diagnosis?  Q59.563 (ICD-10-CM) - Status post total replacement of right hip    Treatment diagnosis? (if different than referring diagnosis) M25.551 What was this (referring dx) caused by? [x]  Surgery []  Fall []  Ongoing issue []  Arthritis []  Other: ____________  Laterality: [x]  Rt []  Lt []  Both  Check all possible CPT codes:  *CHOOSE 10 OR LESS*    See Planned Interventions listed in the Plan section of the Evaluation.

## 2023-09-02 ENCOUNTER — Encounter (HOSPITAL_BASED_OUTPATIENT_CLINIC_OR_DEPARTMENT_OTHER): Admitting: Physical Therapy

## 2023-09-05 ENCOUNTER — Encounter: Payer: Self-pay | Admitting: Orthopaedic Surgery

## 2023-09-05 ENCOUNTER — Ambulatory Visit: Admitting: Orthopaedic Surgery

## 2023-09-05 ENCOUNTER — Ambulatory Visit (HOSPITAL_BASED_OUTPATIENT_CLINIC_OR_DEPARTMENT_OTHER): Attending: Orthopaedic Surgery | Admitting: Physical Therapy

## 2023-09-05 DIAGNOSIS — M25562 Pain in left knee: Secondary | ICD-10-CM | POA: Insufficient documentation

## 2023-09-05 DIAGNOSIS — M25662 Stiffness of left knee, not elsewhere classified: Secondary | ICD-10-CM | POA: Insufficient documentation

## 2023-09-05 DIAGNOSIS — R262 Difficulty in walking, not elsewhere classified: Secondary | ICD-10-CM | POA: Diagnosis not present

## 2023-09-05 DIAGNOSIS — R2689 Other abnormalities of gait and mobility: Secondary | ICD-10-CM | POA: Insufficient documentation

## 2023-09-05 DIAGNOSIS — G8929 Other chronic pain: Secondary | ICD-10-CM | POA: Diagnosis not present

## 2023-09-05 DIAGNOSIS — M25551 Pain in right hip: Secondary | ICD-10-CM | POA: Insufficient documentation

## 2023-09-05 DIAGNOSIS — Z96641 Presence of right artificial hip joint: Secondary | ICD-10-CM

## 2023-09-05 DIAGNOSIS — M6281 Muscle weakness (generalized): Secondary | ICD-10-CM | POA: Insufficient documentation

## 2023-09-05 NOTE — Therapy (Signed)
 OUTPATIENT PHYSICAL THERAPY EVALUATION   Patient Name: Jaime Rose MRN: [id number] DOB:[birth year], 73 y.o., female Today's Date: 09/05/2023  END OF SESSION:  PT End of Session - 09/05/23 1436     Visit Number 4    Number of Visits 17    Date for PT Re-Evaluation 10/12/23    Authorization Type [employer] M[employer]    Authorization - Number of Visits 17    PT Start Time 1431    PT Stop Time 1514    PT Time Calculation (min) 43 min    Activity Tolerance Patient tolerated treatment well    Behavior During Therapy WFL for tasks assessed/performed               Past Medical History:  Diagnosis Date   ALLERGIC RHINITIS    Allergy 1985   seasonal allergies   Arthritis    CHICKENPOX, HX OF 12/21/2009   Qualifier: Diagnosis of  By: Myrtice Athens MD, Rufina Cough    Heart murmur    MR   Hyperthyroidism    normal TFTs 11/2009 , dx 10/2011   Pre-diabetes    Varicose vein    s/p sclerosis tx summer 2013   Past Surgical History:  Procedure Laterality Date   ENDOVENOUS ABLATION SAPHENOUS VEIN W/ LASER  04/11/2012   endovenous laser ablation right greater saphenous vein and stab phlebectomy  right leg  by Ouida Bloom MD   NO PAST SURGERIES     TOTAL HIP ARTHROPLASTY Right 07/26/2023   Procedure: ARTHROPLASTY, HIP, TOTAL, ANTERIOR APPROACH;  Surgeon: Arnie Lao, MD;  Location: [facility];  Service: Orthopedics;  Laterality: Right;   Patient Active Problem List   Diagnosis Date Noted   Status post total replacement of right hip 07/26/2023   Hyperlipidemia 07/17/2023   Elevated coronary artery calcium  score 07/17/2023   Preoperative clearance 07/17/2023   Mitral regurgitation, moderate-possibly severe 07/11/2023   Vitamin D  deficiency 05/23/2023   Foot pain, left 06/02/2021   Ptosis of right eyelid 04/12/2020   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 12/29/2018   Osteopenia 01/10/2017   Carotid artery stenosis, asymptomatic, bilateral 12/21/2016   Prediabetes 12/21/2016   Hypothyroidism  03/20/2012   Varicose veins of bilateral lower extremities with other complications 12/21/2011   Allergic rhinitis 12/21/2009   Cardiac murmur 12/21/2009     REFERRING PROVIDER: Norberto Bear, MD  REFERRING DIAG:  [phone] (ICD-10-CM) - Status post total replacement of right hip     S/p Rt THA Rationale for Evaluation and Treatment: Rehabilitation  THERAPY DIAG:  Pain in right hip  Difficulty in walking, not elsewhere classified  ONSET DATE: DOS 07/26/23   SUBJECTIVE:  SUBJECTIVE STATEMENT: Left knee is the problem.   PERTINENT HISTORY:  none  PAIN:  Are you having pain? Yes: NPRS scale: 3 Pain location: Rt laeral hip Pain description: tight, tender Aggravating factors: walking Relieving factors: rest  PRECAUTIONS:  Anterior hip  RED FLAGS: None   WEIGHT BEARING RESTRICTIONS:  No  FALLS:  Has patient fallen in last 6 months? No  LIVING ENVIRONMENT: 4 steps to get into home, bedroom on main floor, has a second story.  OCCUPATION:  Next week to return to work   PLOF:  Independent  PATIENT GOALS:  Don my own sock and shoes. Preserve left knee to avoid TKA, play pickle ball   OBJECTIVE:  Note: Objective measures were completed at Evaluation unless otherwise noted.  PATIENT SURVEYS:  LEFS 28/80  COGNITIVE STATUS: Within functional limits for tasks assessed   SENSATION: WFL  EDEMA:  Yes: mild  POSTURE:  Lacking full hip extension bilaterally  GAIT: EVAL: antalgic lacking hip extension through stance phase/toe off   Body Part #1 Hip  PALPATION: Eval- edema and warmth  LOWER EXTREMITY ROM:     Active  Right eval Left eval  Hip flexion    Hip extension -12 -6  Hip abduction    Hip adduction    Hip internal rotation    Hip external rotation     Knee flexion    Knee extension- seated 0 -8   (Blank rows = not tested)                                                                                                                                TREATMENT DATE:  5/8: Seated hip hinge with cane- LE turnout for hip stretch Hip hinge to stand cues for glut set Roller to Lt quads in mod thomas stretch Seated hamstring stretch Patellar tracking U ktape Mini SLR from hamstring stretch position Gastroc stretch on slant board Iso hip ext press back swiss ball on wall Weight shift to stance phase against wall  4/30 Manual: Trigger point release to right  anterior hip.  Grade II and III Posterior and anterior glides to the left knee  IT band release on the left   There-ex:  Hip ER PROM  LAQ 2x10 red  Hip abduction 2x15 red felt it in the right knee a little  Neuro-re-ed: Standing heel raise with cuing for posture 2x15   4/21: Ktape for patellar tracking Lunge with back heel raise + glut set Bilat heel raise with glut set Gastroc stretch Gait- heel/toe, glut & gastroc activation Sidelying roller to ITB, HS on left side Supine glut+quad set+ small SLR, alternating Seated HS stretch Seated hip hinge progressed to sit<>stand with forward reach    PATIENT EDUCATION:  Education details: Anatomy of condition, POC, HEP, exercise form/rationale Person educated: Patient Education method: Explanation, Demonstration, Tactile cues, Verbal cues, and Handouts Education comprehension: verbalized understanding, returned demonstration, verbal cues required, tactile cues  required, and needs further education  HOME EXERCISE PROGRAM: Access Code: [email or web address] URL: [email or web address] Date: 08/12/2023 Prepared by: Keven Pel  Exercises - Prone Press Up On Elbows  - 2 x daily - 7 x weekly - 2-3 min hold - Prone Gluteal Sets  - 2 x daily - 7 x weekly - 2 sets - 10 reps - Prone Knee Flexion  - 2 x daily - 7 x  weekly - 2 sets - 10 reps - Seated Hamstring Stretch  - 3 x daily - 7 x weekly - 2 sets - 5 breaths hold - Seated Hip Flexion Toward Target  - 3 x daily - 7 x weekly - 1 sets - 5 reps - Seated Long Arc Quad  - 2 x daily - 7 x weekly - 2 sets - 10 reps - Standing Gluteal Sets  - Standing Gastroc Stretch  - 2 x daily - 7 x weekly - 2 sets - 5 breaths hold - Lateral Weight Shift with Parallel Bars (BKA)  - 3 x daily - 7 x weekly - 1 sets - 10 reps   ASSESSMENT:  CLINICAL IMPRESSION: lacking full exension in LLE limiting progression of Rt hip strength. Able to stand from lower chair today without use of UEs with cues for quad/glut contraction.     REHAB POTENTIAL: Good  CLINICAL DECISION MAKING: Stable/uncomplicated  EVALUATION COMPLEXITY: Low   GOALS: Goals reviewed with patient? Yes  SHORT TERM GOALS: Target date: 08/31/23  Sit<>stand without use of UEs Baseline: Goal status: INITIAL  2.  Standing hip hip to neutral extension Baseline:  Goal status: INITIAL    LONG TERM GOALS: Target date: POC date  Stand as lon as necessary for work with minimal discomfort Baseline:  Goal status: INITIAL  2.  LEFS to improve by MDC x2 Baseline:  Goal status: INITIAL  3.  Hip abd strength to 80% of opposite LE Baseline:  Goal status: INITIAL  4.  Easily don socks and shoes independently Baseline:  Goal status: INITIAL  5.  Begin training for return to pickle ball Baseline:  Goal status: INITIAL     PLAN:  PT FREQUENCY: 1-2x/week  PT DURATION: POC date  PLANNED INTERVENTIONS: 97164- PT Re-evaluation, 97750- Physical Performance Testing, 97110-Therapeutic exercises, 97530- Therapeutic activity, 97112- Neuromuscular re-education, 97535- Self Care, 08657- Manual therapy, [phone]- Gait training, [phone]- Aquatic Therapy, Patient/Family education, Balance training, Stair training, Taping, Dry Needling, Joint mobilization, Spinal mobilization, Scar mobilization, Cryotherapy, and  Moist heat.  PLAN FOR NEXT SESSION: continue flexibility and strength to reduce post pelvic tilt.    Yelina Sarratt C. Katina Remick PT, DPT 09/05/23 3:52 PM  Referring diagnosis?  U13.244 (ICD-10-CM) - Status post total replacement of right hip    Treatment diagnosis? (if different than referring diagnosis) M25.551 What was this (referring dx) caused by? [x]  Surgery []  Fall []  Ongoing issue []  Arthritis []  Other: ____________  Laterality: [x]  Rt []  Lt []  Both  Check all possible CPT codes:  *CHOOSE 10 OR LESS*    See Planned Interventions listed in the Plan section of the Evaluation.

## 2023-09-05 NOTE — Progress Notes (Signed)
 The patient is a 73 year old who is now 6 weeks status post a right total hip replacement.  She is doing great with her recovery from her hip.  She is still in physical therapy as an outpatient as a relates to her left knee and that has been very helpful for her.  She has therapy scheduled until later in June and I am fine with that being extended if it is still being helpful for her for her mobility.  Her right operative hip moves smoothly and fluidly with no blocks or rotation.  Her incision is healing nicely.  From my standpoint she can get in her pool and swim.  She will continue therapy on her left knee.  We will see her back from a hip standpoint in 6 months with a standing AP pelvis and lateral of her right operative hip.  She knows to reach out if there are issues before then.

## 2023-09-10 ENCOUNTER — Ambulatory Visit (HOSPITAL_BASED_OUTPATIENT_CLINIC_OR_DEPARTMENT_OTHER): Admitting: Physical Therapy

## 2023-09-10 DIAGNOSIS — M25551 Pain in right hip: Secondary | ICD-10-CM | POA: Diagnosis not present

## 2023-09-10 DIAGNOSIS — M25662 Stiffness of left knee, not elsewhere classified: Secondary | ICD-10-CM

## 2023-09-10 DIAGNOSIS — M6281 Muscle weakness (generalized): Secondary | ICD-10-CM

## 2023-09-10 DIAGNOSIS — R2689 Other abnormalities of gait and mobility: Secondary | ICD-10-CM

## 2023-09-10 DIAGNOSIS — G8929 Other chronic pain: Secondary | ICD-10-CM | POA: Diagnosis not present

## 2023-09-10 DIAGNOSIS — R262 Difficulty in walking, not elsewhere classified: Secondary | ICD-10-CM | POA: Diagnosis not present

## 2023-09-10 DIAGNOSIS — M25562 Pain in left knee: Secondary | ICD-10-CM | POA: Diagnosis not present

## 2023-09-10 NOTE — Therapy (Signed)
 OUTPATIENT PHYSICAL THERAPY EVALUATION   Patient Name: Jaime Rose MRN: [id number] DOB:08/18/1950, 73 y.o., female Today's Date: 09/11/2023  END OF SESSION:  PT End of Session - 09/11/23 0944     Visit Number 5    Number of Visits 17    Date for PT Re-Evaluation 10/12/23    Authorization Type [employer] M[employer]    PT Start Time 1430    PT Stop Time 1512    PT Time Calculation (min) 42 min    Activity Tolerance Patient tolerated treatment well    Behavior During Therapy WFL for tasks assessed/performed                Past Medical History:  Diagnosis Date   ALLERGIC RHINITIS    Allergy 1985   seasonal allergies   Arthritis    CHICKENPOX, HX OF 12/21/2009   Qualifier: Diagnosis of  By: Myrtice Athens MD, Rufina Cough    Heart murmur    MR   Hyperthyroidism    normal TFTs 11/2009 , dx 10/2011   Pre-diabetes    Varicose vein    s/p sclerosis tx summer 2013   Past Surgical History:  Procedure Laterality Date   ENDOVENOUS ABLATION SAPHENOUS VEIN W/ LASER  04/11/2012   endovenous laser ablation right greater saphenous vein and stab phlebectomy  right leg  by Ouida Bloom MD   NO PAST SURGERIES     TOTAL HIP ARTHROPLASTY Right 07/26/2023   Procedure: ARTHROPLASTY, HIP, TOTAL, ANTERIOR APPROACH;  Surgeon: Arnie Lao, MD;  Location: [facility];  Service: Orthopedics;  Laterality: Right;   Patient Active Problem List   Diagnosis Date Noted   Status post total replacement of right hip 07/26/2023   Hyperlipidemia 07/17/2023   Elevated coronary artery calcium  score 07/17/2023   Preoperative clearance 07/17/2023   Mitral regurgitation, moderate-possibly severe 07/11/2023   Vitamin D  deficiency 05/23/2023   Foot pain, left 06/02/2021   Ptosis of right eyelid 04/12/2020   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 12/29/2018   Osteopenia 01/10/2017   Carotid artery stenosis, asymptomatic, bilateral 12/21/2016   Prediabetes 12/21/2016   Hypothyroidism 03/20/2012   Varicose veins of  bilateral lower extremities with other complications 12/21/2011   Allergic rhinitis 12/21/2009   Cardiac murmur 12/21/2009     REFERRING PROVIDER: Norberto Bear, MD  REFERRING DIAG:  [phone] (ICD-10-CM) - Status post total replacement of right hip     S/p Rt THA Rationale for Evaluation and Treatment: Rehabilitation  THERAPY DIAG:  Pain in right hip  Difficulty in walking, not elsewhere classified  Chronic pain of left knee  Stiffness of left knee, not elsewhere classified  Muscle weakness (generalized)  Other abnormalities of gait and mobility  ONSET DATE: DOS 07/26/23   SUBJECTIVE:  SUBJECTIVE STATEMENT: The patients hip feels good. Her left knee has been painful for about 2 weeks.   PERTINENT HISTORY:  none  PAIN:  Are you having pain? Yes: NPRS scale: 3 Pain location: Rt laeral hip Pain description: tight, tender Aggravating factors: walking Relieving factors: rest  PRECAUTIONS:  Anterior hip  RED FLAGS: None   WEIGHT BEARING RESTRICTIONS:  No  FALLS:  Has patient fallen in last 6 months? No  LIVING ENVIRONMENT: 4 steps to get into home, bedroom on main floor, has a second story.  OCCUPATION:  Next week to return to work   PLOF:  Independent  PATIENT GOALS:  Don my own sock and shoes. Preserve left knee to avoid TKA, play pickle ball   OBJECTIVE:  Note: Objective measures were completed at Evaluation unless otherwise noted.  PATIENT SURVEYS:  LEFS 28/80  COGNITIVE STATUS: Within functional limits for tasks assessed   SENSATION: WFL  EDEMA:  Yes: mild  POSTURE:  Lacking full hip extension bilaterally  GAIT: EVAL: antalgic lacking hip extension through stance phase/toe off   Body Part #1 Hip  PALPATION: Eval- edema and  warmth  LOWER EXTREMITY ROM:     Active  Right eval Left eval  Hip flexion    Hip extension -12 -6  Hip abduction    Hip adduction    Hip internal rotation    Hip external rotation    Knee flexion    Knee extension- seated 0 -8   (Blank rows = not tested)                                                                                                                                TREATMENT DATE:  5/13 Manual: Grade 2 and 3 PA and AP mobilizations to reduce pain and improve mobility of the left knee.  Significant improvement in flexion noted with manual therapy.  Trigger point release to IT band.  Reviewed self trigger point release to IT band.  Self patellar mobilization.  Mild pain noted with patellar mobilization. There ex Quad set 3 x 10 Straight leg raise 3 times send advise only do with the left leg. Standing weight shifts 3 x 10.  Reviewed how all these exercises can benefit her knee. NuStep 5 minutes reviewed how to use for self stretching.  Patient advised not to move past pain point  Reviewed current HEP and how to use  5/8: Seated hip hinge with cane- LE turnout for hip stretch Hip hinge to stand cues for glut set Roller to Lt quads in mod thomas stretch Seated hamstring stretch Patellar tracking U ktape Mini SLR from hamstring stretch position Gastroc stretch on slant board Iso hip ext press back swiss ball on wall Weight shift to stance phase against wall  4/30 Manual: Trigger point release to right  anterior hip.  Grade II and III Posterior and anterior glides to the left knee  IT band release on  the left   There-ex:  Hip ER PROM  LAQ 2x10 red  Hip abduction 2x15 red felt it in the right knee a little  Neuro-re-ed: Standing heel raise with cuing for posture 2x15   NuStep 5 minutes reviewed how to use for self stretching.  Patient advised not to move past  4/21: Ktape for patellar tracking Lunge with back heel raise + glut set Bilat heel  raise with glut set Gastroc stretch Gait- heel/toe, glut & gastroc activation Sidelying roller to ITB, HS on left side Supine glut+quad set+ small SLR, alternating Seated HS stretch Seated hip hinge progressed to sit<>stand with forward reach    PATIENT EDUCATION:  Education details: Anatomy of condition, POC, HEP, exercise form/rationale Person educated: Patient Education method: Explanation, Demonstration, Tactile cues, Verbal cues, and Handouts Education comprehension: verbalized understanding, returned demonstration, verbal cues required, tactile cues required, and needs further education  HOME EXERCISE PROGRAM: Access Code: [email or web address] URL: [email or web address] Date: 08/12/2023 Prepared by: Keven Pel  Exercises - Prone Press Up On Elbows  - 2 x daily - 7 x weekly - 2-3 min hold - Prone Gluteal Sets  - 2 x daily - 7 x weekly - 2 sets - 10 reps - Prone Knee Flexion  - 2 x daily - 7 x weekly - 2 sets - 10 reps - Seated Hamstring Stretch  - 3 x daily - 7 x weekly - 2 sets - 5 breaths hold - Seated Hip Flexion Toward Target  - 3 x daily - 7 x weekly - 1 sets - 5 reps - Seated Long Arc Quad  - 2 x daily - 7 x weekly - 2 sets - 10 reps - Standing Gluteal Sets  - Standing Gastroc Stretch  - 2 x daily - 7 x weekly - 2 sets - 5 breaths hold - Lateral Weight Shift with Parallel Bars (BKA)  - 3 x daily - 7 x weekly - 1 sets - 10 reps   ASSESSMENT:  CLINICAL IMPRESSION: Therapy focused on reducing pain in the left knee.  The left knee is affecting how she is walking at this time.  Her right hip has been doing well.  She reported reduction in pain with mobility and weightbearing following manual therapy.  Reviewed exercises that she can do for her knee over the next week or so.  She is advised to continue with her stretching and her HEP.  She already has a hamstring and calf stretch.  She was advised these can be effective for the knee as well.  Therapy will continue  to progress functional strengthening as tolerated.  REHAB POTENTIAL: Good  CLINICAL DECISION MAKING: Stable/uncomplicated  EVALUATION COMPLEXITY: Low   GOALS: Goals reviewed with patient? Yes  SHORT TERM GOALS: Target date: 08/31/23  Sit<>stand without use of UEs Baseline: Goal status: INITIAL  2.  Standing hip hip to neutral extension Baseline:  Goal status: INITIAL    LONG TERM GOALS: Target date: POC date  Stand as lon as necessary for work with minimal discomfort Baseline:  Goal status: INITIAL  2.  LEFS to improve by MDC x2 Baseline:  Goal status: INITIAL  3.  Hip abd strength to 80% of opposite LE Baseline:  Goal status: INITIAL  4.  Easily don socks and shoes independently Baseline:  Goal status: INITIAL  5.  Begin training for return to pickle ball Baseline:  Goal status: INITIAL     PLAN:  PT FREQUENCY: 1-2x/week  PT DURATION: POC date  PLANNED  INTERVENTIONS: 97164- PT Re-evaluation, 97750- Physical Performance Testing, 97110-Therapeutic exercises, 97530- Therapeutic activity, [id number]- Neuromuscular re-education, [phone]- Self Care, 09811- Manual therapy, [phone]- Gait training, [phone]- Aquatic Therapy, Patient/Family education, Balance training, Stair training, Taping, Dry Needling, Joint mobilization, Spinal mobilization, Scar mobilization, Cryotherapy, and Moist heat.  PLAN FOR NEXT SESSION: continue flexibility and strength to reduce post pelvic tilt.    Jessica C. Hightower PT, DPT 09/11/23 9:46 AM  Referring diagnosis?  Z30.865 (ICD-10-CM) - Status post total replacement of right hip    Treatment diagnosis? (if different than referring diagnosis) M25.551 What was this (referring dx) caused by? [x]  Surgery []  Fall []  Ongoing issue []  Arthritis []  Other: ____________  Laterality: [x]  Rt []  Lt []  Both  Check all possible CPT codes:  *CHOOSE 10 OR LESS*    See Planned Interventions listed in the Plan section of the Evaluation.

## 2023-09-11 ENCOUNTER — Encounter (HOSPITAL_BASED_OUTPATIENT_CLINIC_OR_DEPARTMENT_OTHER): Payer: Self-pay | Admitting: Physical Therapy

## 2023-09-13 ENCOUNTER — Encounter (HOSPITAL_BASED_OUTPATIENT_CLINIC_OR_DEPARTMENT_OTHER): Payer: Self-pay | Admitting: Physical Therapy

## 2023-09-13 ENCOUNTER — Ambulatory Visit (HOSPITAL_BASED_OUTPATIENT_CLINIC_OR_DEPARTMENT_OTHER): Admitting: Physical Therapy

## 2023-09-13 DIAGNOSIS — G8929 Other chronic pain: Secondary | ICD-10-CM | POA: Diagnosis not present

## 2023-09-13 DIAGNOSIS — R262 Difficulty in walking, not elsewhere classified: Secondary | ICD-10-CM | POA: Diagnosis not present

## 2023-09-13 DIAGNOSIS — R2689 Other abnormalities of gait and mobility: Secondary | ICD-10-CM | POA: Diagnosis not present

## 2023-09-13 DIAGNOSIS — M25551 Pain in right hip: Secondary | ICD-10-CM

## 2023-09-13 DIAGNOSIS — M6281 Muscle weakness (generalized): Secondary | ICD-10-CM | POA: Diagnosis not present

## 2023-09-13 DIAGNOSIS — M25662 Stiffness of left knee, not elsewhere classified: Secondary | ICD-10-CM | POA: Diagnosis not present

## 2023-09-13 DIAGNOSIS — M25562 Pain in left knee: Secondary | ICD-10-CM | POA: Diagnosis not present

## 2023-09-13 NOTE — Therapy (Signed)
 OUTPATIENT PHYSICAL THERAPY EVALUATION   Patient Name: Jaime Rose MRN: [id number] DOB:[birth year], 73 y.o., female Today's Date: 09/13/2023  END OF SESSION:  PT End of Session - 09/13/23 1051     Visit Number 6    Number of Visits 17    Date for PT Re-Evaluation 10/12/23    Authorization Type [employer] MCR    PT Start Time 0845    PT Stop Time 0928    PT Time Calculation (min) 43 min    Activity Tolerance Patient tolerated treatment well    Behavior During Therapy [facility] for tasks assessed/performed                Past Medical History:  Diagnosis Date   ALLERGIC RHINITIS    Allergy 1985   seasonal allergies   Arthritis    CHICKENPOX, HX OF 12/21/2009   Qualifier: Diagnosis of  By: Myrtice Athens MD, Rufina Cough    Heart murmur    MR   Hyperthyroidism    normal TFTs 11/2009 , dx 10/2011   Pre-diabetes    Varicose vein    s/p sclerosis tx summer 2013   Past Surgical History:  Procedure Laterality Date   ENDOVENOUS ABLATION SAPHENOUS VEIN W/ LASER  04/11/2012   endovenous laser ablation right greater saphenous vein and stab phlebectomy  right leg  by Ouida Bloom MD   NO PAST SURGERIES     TOTAL HIP ARTHROPLASTY Right 07/26/2023   Procedure: ARTHROPLASTY, HIP, TOTAL, ANTERIOR APPROACH;  Surgeon: Arnie Lao, MD;  Location: [facility];  Service: Orthopedics;  Laterality: Right;   Patient Active Problem List   Diagnosis Date Noted   Status post total replacement of right hip 07/26/2023   Hyperlipidemia 07/17/2023   Elevated coronary artery calcium  score 07/17/2023   Preoperative clearance 07/17/2023   Mitral regurgitation, moderate-possibly severe 07/11/2023   Vitamin D  deficiency 05/23/2023   Foot pain, left 06/02/2021   Ptosis of right eyelid 04/12/2020   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 12/29/2018   Osteopenia 01/10/2017   Carotid artery stenosis, asymptomatic, bilateral 12/21/2016   Prediabetes 12/21/2016   Hypothyroidism 03/20/2012   Varicose veins of  bilateral lower extremities with other complications 12/21/2011   Allergic rhinitis 12/21/2009   Cardiac murmur 12/21/2009     REFERRING PROVIDER: Norberto Bear, MD  REFERRING DIAG:  [phone] (ICD-10-CM) - Status post total replacement of right hip     S/p Rt THA Rationale for Evaluation and Treatment: Rehabilitation  THERAPY DIAG:  Pain in right hip  Difficulty in walking, not elsewhere classified  ONSET DATE: DOS 07/26/23   SUBJECTIVE:  SUBJECTIVE STATEMENT: The patients hip feels good. Her left knee has been painful for about 2 weeks.   PERTINENT HISTORY:  none  PAIN:  Are you having pain? Yes: NPRS scale: 3 Pain location: Rt laeral hip Pain description: tight, tender Aggravating factors: walking Relieving factors: rest  PRECAUTIONS:  Anterior hip  RED FLAGS: None   WEIGHT BEARING RESTRICTIONS:  No  FALLS:  Has patient fallen in last 6 months? No  LIVING ENVIRONMENT: 4 steps to get into home, bedroom on main floor, has a second story.  OCCUPATION:  Next week to return to work   PLOF:  Independent  PATIENT GOALS:  Don my own sock and shoes. Preserve left knee to avoid TKA, play pickle ball   OBJECTIVE:  Note: Objective measures were completed at Evaluation unless otherwise noted.  PATIENT SURVEYS:  LEFS 28/80  COGNITIVE STATUS: Within functional limits for tasks assessed   SENSATION: WFL  EDEMA:  Yes: mild  POSTURE:  Lacking full hip extension bilaterally  GAIT: EVAL: antalgic lacking hip extension through stance phase/toe off   Body Part #1 Hip  PALPATION: Eval- edema and warmth  LOWER EXTREMITY ROM:     Active  Right eval Left eval  Hip flexion    Hip extension -12 -6  Hip abduction    Hip adduction    Hip internal rotation     Hip external rotation    Knee flexion    Knee extension- seated 0 -8   (Blank rows = not tested)                                                                                                                                TREATMENT DATE:   5/16 Manual: Grade 2 and 3 PA and AP mobilizations to reduce pain and improve mobility of the left knee.  Significant improvement in flexion noted with manual therapy.  Trigger point release to IT band.  Reviewed self trigger point release to IT band.  Self patellar mobilization.  Mild pain noted with patellar mobilization.  There ex Quad set 3 x 10 SLR 3x10   Neuro -re-ed  Weight shift 2x15  Standing March 2x10  cuing to keep from weight shifting Step onto air-ex 2x10  There-act:  Right leg step up 3x10 4 lbs    5/13 Manual: Grade 2 and 3 PA and AP mobilizations to reduce pain and improve mobility of the left knee.  Significant improvement in flexion noted with manual therapy.  Trigger point release to IT band.  Reviewed self trigger point release to IT band.  Self patellar mobilization.  Mild pain noted with patellar mobilization. There ex Quad set 3 x 10 Straight leg raise 3 times send advise only do with the left leg. Standing weight shifts 3 x 10.  Reviewed how all these exercises can benefit her knee. NuStep 5 minutes reviewed how to use for self stretching.  Patient advised not to  move past pain point  Reviewed current HEP and how to use  5/8: Seated hip hinge with cane- LE turnout for hip stretch Hip hinge to stand cues for glut set Roller to Lt quads in mod thomas stretch Seated hamstring stretch Patellar tracking U ktape Mini SLR from hamstring stretch position Gastroc stretch on slant board Iso hip ext press back swiss ball on wall Weight shift to stance phase against wall     PATIENT EDUCATION:  Education details: Teacher, music of condition, POC, HEP, exercise form/rationale Person educated: Patient Education  method: Explanation, Demonstration, Tactile cues, Verbal cues, and Handouts Education comprehension: verbalized understanding, returned demonstration, verbal cues required, tactile cues required, and needs further education  HOME EXERCISE PROGRAM: Access Code: [email or web address] URL: [email or web address] Date: 08/12/2023 Prepared by: Keven Pel  Exercises - Prone Press Up On Elbows  - 2 x daily - 7 x weekly - 2-3 min hold - Prone Gluteal Sets  - 2 x daily - 7 x weekly - 2 sets - 10 reps - Prone Knee Flexion  - 2 x daily - 7 x weekly - 2 sets - 10 reps - Seated Hamstring Stretch  - 3 x daily - 7 x weekly - 2 sets - 5 breaths hold - Seated Hip Flexion Toward Target  - 3 x daily - 7 x weekly - 1 sets - 5 reps - Seated Long Arc Quad  - 2 x daily - 7 x weekly - 2 sets - 10 reps - Standing Gluteal Sets  - Standing Gastroc Stretch  - 2 x daily - 7 x weekly - 2 sets - 5 breaths hold - Lateral Weight Shift with Parallel Bars (BKA)  - 3 x daily - 7 x weekly - 1 sets - 10 reps   ASSESSMENT:  CLINICAL IMPRESSION: The patient had improvement in pain in her left knee today. We were able to work on weight bearing activity. At this time she is doing great with her right hip. Her left knee is the main problem. She has less pain and better movement today. We advanced to weight bearing exercises today. She had no significant increase in pain with weight bearing. We also worked on instability exercises. Therapy will continue to advance functional strengthening as tolerated.    REHAB POTENTIAL: Good  CLINICAL DECISION MAKING: Stable/uncomplicated  EVALUATION COMPLEXITY: Low   GOALS: Goals reviewed with patient? Yes  SHORT TERM GOALS: Target date: 08/31/23  Sit<>stand without use of UEs Baseline: Goal status: improving but still having difficulty bending the knee. 5/16  2.  Standing hip hip to neutral extension Baseline:  Goal status: 5/16 improving     LONG TERM GOALS: Target  date: POC date  Stand as lon as necessary for work with minimal discomfort Baseline:  Goal status: improving   2.  LEFS to improve by MDC x2 Baseline:  Goal status: INITIAL  3.  Hip abd strength to 80% of opposite LE Baseline:  Goal status: INITIAL  4.  Easily don socks and shoes independently Baseline:  Goal status: INITIAL  5.  Begin training for return to pickle ball Baseline:  Goal status: INITIAL     PLAN:  PT FREQUENCY: 1-2x/week  PT DURATION: POC date  PLANNED INTERVENTIONS: 97164- PT Re-evaluation, 97750- Physical Performance Testing, 97110-Therapeutic exercises, 97530- Therapeutic activity, 97112- Neuromuscular re-education, 97535- Self Care, 29518- Manual therapy, [phone]- Gait training, [phone]- Aquatic Therapy, Patient/Family education, Balance training, Stair training, Taping, Dry Needling, Joint mobilization, Spinal mobilization, Scar mobilization, Cryotherapy, and Moist  heat.  PLAN FOR NEXT SESSION: continue flexibility and strength to reduce post pelvic tilt.   Signa Drier PT DPT 09/13/23 12:58 PM  Referring diagnosis?  W09.811 (ICD-10-CM) - Status post total replacement of right hip    Treatment diagnosis? (if different than referring diagnosis) M25.551 What was this (referring dx) caused by? [x]  Surgery []  Fall []  Ongoing issue []  Arthritis []  Other: ____________  Laterality: [x]  Rt []  Lt []  Both  Check all possible CPT codes:  *CHOOSE 10 OR LESS*    See Planned Interventions listed in the Plan section of the Evaluation.

## 2023-09-16 ENCOUNTER — Encounter (HOSPITAL_BASED_OUTPATIENT_CLINIC_OR_DEPARTMENT_OTHER): Payer: Self-pay | Admitting: Physical Therapy

## 2023-09-16 ENCOUNTER — Ambulatory Visit (HOSPITAL_BASED_OUTPATIENT_CLINIC_OR_DEPARTMENT_OTHER): Admitting: Physical Therapy

## 2023-09-16 DIAGNOSIS — M25662 Stiffness of left knee, not elsewhere classified: Secondary | ICD-10-CM | POA: Diagnosis not present

## 2023-09-16 DIAGNOSIS — G8929 Other chronic pain: Secondary | ICD-10-CM

## 2023-09-16 DIAGNOSIS — M6281 Muscle weakness (generalized): Secondary | ICD-10-CM | POA: Diagnosis not present

## 2023-09-16 DIAGNOSIS — M25551 Pain in right hip: Secondary | ICD-10-CM | POA: Diagnosis not present

## 2023-09-16 DIAGNOSIS — M25562 Pain in left knee: Secondary | ICD-10-CM | POA: Diagnosis not present

## 2023-09-16 DIAGNOSIS — R262 Difficulty in walking, not elsewhere classified: Secondary | ICD-10-CM | POA: Diagnosis not present

## 2023-09-16 DIAGNOSIS — R2689 Other abnormalities of gait and mobility: Secondary | ICD-10-CM | POA: Diagnosis not present

## 2023-09-16 NOTE — Therapy (Signed)
 OUTPATIENT PHYSICAL THERAPY EVALUATION   Patient Name: Jaime Rose MRN: [id number] DOB:31-May-1950, 73 y.o., female Today's Date: 09/16/2023  END OF SESSION:  PT End of Session - 09/16/23 1618     Visit Number 7    Number of Visits 17    Date for PT Re-Evaluation 10/12/23    Authorization Type [employer] M[employer]    Authorization - Number of Visits 17    PT Start Time 1618    PT Stop Time 1700    PT Time Calculation (min) 42 min    Activity Tolerance Patient tolerated treatment well    Behavior During Therapy WFL for tasks assessed/performed                Past Medical History:  Diagnosis Date   ALLERGIC RHINITIS    Allergy 1985   seasonal allergies   Arthritis    CHICKENPOX, HX OF 12/21/2009   Qualifier: Diagnosis of  By: Myrtice Athens MD, Rufina Cough    Heart murmur    MR   Hyperthyroidism    normal TFTs 11/2009 , dx 10/2011   Pre-diabetes    Varicose vein    s/p sclerosis tx summer 2013   Past Surgical History:  Procedure Laterality Date   ENDOVENOUS ABLATION SAPHENOUS VEIN W/ LASER  04/11/2012   endovenous laser ablation right greater saphenous vein and stab phlebectomy  right leg  by Ouida Bloom MD   NO PAST SURGERIES     TOTAL HIP ARTHROPLASTY Right 07/26/2023   Procedure: ARTHROPLASTY, HIP, TOTAL, ANTERIOR APPROACH;  Surgeon: Arnie Lao, MD;  Location: [facility];  Service: Orthopedics;  Laterality: Right;   Patient Active Problem List   Diagnosis Date Noted   Status post total replacement of right hip 07/26/2023   Hyperlipidemia 07/17/2023   Elevated coronary artery calcium  score 07/17/2023   Preoperative clearance 07/17/2023   Mitral regurgitation, moderate-possibly severe 07/11/2023   Vitamin D  deficiency 05/23/2023   Foot pain, left 06/02/2021   Ptosis of right eyelid 04/12/2020   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 12/29/2018   Osteopenia 01/10/2017   Carotid artery stenosis, asymptomatic, bilateral 12/21/2016   Prediabetes 12/21/2016    Hypothyroidism 03/20/2012   Varicose veins of bilateral lower extremities with other complications 12/21/2011   Allergic rhinitis 12/21/2009   Cardiac murmur 12/21/2009     REFERRING PROVIDER: Norberto Bear, MD  REFERRING DIAG:  [phone] (ICD-10-CM) - Status post total replacement of right hip     S/p Rt THA Rationale for Evaluation and Treatment: Rehabilitation  THERAPY DIAG:  Pain in right hip  Difficulty in walking, not elsewhere classified  Chronic pain of left knee  ONSET DATE: DOS 07/26/23   SUBJECTIVE:  SUBJECTIVE STATEMENT: Lateral Ltknee pain- ok if I keep pressure on it when I walk.   PERTINENT HISTORY:  none  PAIN:  Are you having pain? Yes: NPRS scale: 3 Pain location: Rt laeral hip Pain description: tight, tender Aggravating factors: walking Relieving factors: rest  PRECAUTIONS:  Anterior hip  RED FLAGS: None   WEIGHT BEARING RESTRICTIONS:  No  FALLS:  Has patient fallen in last 6 months? No  LIVING ENVIRONMENT: 4 steps to get into home, bedroom on main floor, has a second story.  OCCUPATION:  Next week to return to work   PLOF:  Independent  PATIENT GOALS:  Don my own sock and shoes. Preserve left knee to avoid TKA, play pickle ball   OBJECTIVE:  Note: Objective measures were completed at Evaluation unless otherwise noted.  PATIENT SURVEYS:  LEFS 28/80  COGNITIVE STATUS: Within functional limits for tasks assessed   SENSATION: WFL  EDEMA:  Yes: mild  POSTURE:  Lacking full hip extension bilaterally  GAIT: EVAL: antalgic lacking hip extension through stance phase/toe off   Body Part #1 Hip  PALPATION: Eval- edema and warmth  LOWER EXTREMITY ROM:     Active  Right eval Left eval Rt Lt  Standing 5/19  Hip flexion      Hip extension -12 -6 -7/-2  Hip abduction     Hip adduction     Hip internal rotation     Hip external rotation     Knee flexion     Knee extension- seated 0 -8 -5   (Blank rows = not tested)                                                                                                                                TREATMENT DATE:   Treatment                            5/19: Blank lines following charge title = not provided on this treatment date.   Manual:  TPDN No Ktape LCL There-ex: Seated mini SLR from hamstring stretch Iso hamstring stretch Lateral step up 4" Squats at bar- mod single leg and double leg There-Act: Sit<>stand and progress to walking with upright posture.  Self Care:  Nuro-Re-ed:  Gait Training:    5/16 Manual: Grade 2 and 3 PA and AP mobilizations to reduce pain and improve mobility of the left knee.  Significant improvement in flexion noted with manual therapy.  Trigger point release to IT band.  Reviewed self trigger point release to IT band.  Self patellar mobilization.  Mild pain noted with patellar mobilization.  There ex Quad set 3 x 10 SLR 3x10   Neuro -re-ed  Weight shift 2x15  Standing March 2x10  cuing to keep from weight shifting Step onto air-ex 2x10  There-act:  Right leg step up 3x10 4 lbs      PATIENT EDUCATION:  Education details: Anatomy of condition, POC, HEP, exercise form/rationale Person educated: Patient Education method: Explanation, Demonstration, Tactile cues, Verbal cues, and Handouts Education comprehension: verbalized understanding, returned demonstration, verbal cues required, tactile cues required, and needs further education  HOME EXERCISE PROGRAM: Access Code: [email or web address] URL: [email or web address]  ASSESSMENT:  CLINICAL IMPRESSION: Improving ROM bilaterally. Rt hip is doing very well but is limited by Lt knee- will continue to address bilateral deficits to meet goals.    REHAB  POTENTIAL: Good  CLINICAL DECISION MAKING: Stable/uncomplicated  EVALUATION COMPLEXITY: Low   GOALS: Goals reviewed with patient? Yes  SHORT TERM GOALS: Target date: 08/31/23  Sit<>stand without use of UEs Baseline: Goal status: improving but still having difficulty bending the knee. 5/16  2.  Standing hip hip to neutral extension Baseline:  Goal status: 5/16 improving     LONG TERM GOALS: Target date: POC date  Stand as lon as necessary for work with minimal discomfort Baseline:  Goal status: improving   2.  LEFS to improve by MDC x2 Baseline:  Goal status: INITIAL  3.  Hip abd strength to 80% of opposite LE Baseline:  Goal status: INITIAL  4.  Easily don socks and shoes independently Baseline:  Goal status: INITIAL  5.  Begin training for return to pickle ball Baseline:  Goal status: INITIAL     PLAN:  PT FREQUENCY: 1-2x/week  PT DURATION: POC date  PLANNED INTERVENTIONS: 97164- PT Re-evaluation, 97750- Physical Performance Testing, 97110-Therapeutic exercises, 97530- Therapeutic activity, 97112- Neuromuscular re-education, 97535- Self Care, 95621- Manual therapy, [phone]- Gait training, [phone]- Aquatic Therapy, Patient/Family education, Balance training, Stair training, Taping, Dry Needling, Joint mobilization, Spinal mobilization, Scar mobilization, Cryotherapy, and Moist heat.  PLAN FOR NEXT SESSION: continue flexibility and strength to reduce post pelvic tilt.   Delanie Tirrell C. Denzal Meir PT, DPT 09/16/23 5:03 PM   Referring diagnosis?  G29.528 (ICD-10-CM) - Status post total replacement of right hip    Treatment diagnosis? (if different than referring diagnosis) M25.551 What was this (referring dx) caused by? [x]  Surgery []  Fall []  Ongoing issue []  Arthritis []  Other: ____________  Laterality: [x]  Rt []  Lt []  Both  Check all possible CPT codes:  *CHOOSE 10 OR LESS*    See Planned Interventions listed in the Plan section of the Evaluation.

## 2023-09-17 ENCOUNTER — Ambulatory Visit (HOSPITAL_BASED_OUTPATIENT_CLINIC_OR_DEPARTMENT_OTHER): Payer: Self-pay | Admitting: Physical Therapy

## 2023-09-25 ENCOUNTER — Ambulatory Visit (HOSPITAL_BASED_OUTPATIENT_CLINIC_OR_DEPARTMENT_OTHER): Payer: Self-pay | Admitting: Physical Therapy

## 2023-09-25 ENCOUNTER — Encounter (HOSPITAL_BASED_OUTPATIENT_CLINIC_OR_DEPARTMENT_OTHER): Payer: Self-pay | Admitting: Physical Therapy

## 2023-09-25 DIAGNOSIS — M6281 Muscle weakness (generalized): Secondary | ICD-10-CM | POA: Diagnosis not present

## 2023-09-25 DIAGNOSIS — G8929 Other chronic pain: Secondary | ICD-10-CM | POA: Diagnosis not present

## 2023-09-25 DIAGNOSIS — M25662 Stiffness of left knee, not elsewhere classified: Secondary | ICD-10-CM | POA: Diagnosis not present

## 2023-09-25 DIAGNOSIS — M25551 Pain in right hip: Secondary | ICD-10-CM

## 2023-09-25 DIAGNOSIS — R262 Difficulty in walking, not elsewhere classified: Secondary | ICD-10-CM | POA: Diagnosis not present

## 2023-09-25 DIAGNOSIS — R2689 Other abnormalities of gait and mobility: Secondary | ICD-10-CM | POA: Diagnosis not present

## 2023-09-25 DIAGNOSIS — M25562 Pain in left knee: Secondary | ICD-10-CM | POA: Diagnosis not present

## 2023-09-26 ENCOUNTER — Encounter: Payer: Self-pay | Admitting: Internal Medicine

## 2023-09-26 ENCOUNTER — Encounter (HOSPITAL_BASED_OUTPATIENT_CLINIC_OR_DEPARTMENT_OTHER): Payer: Self-pay | Admitting: Physical Therapy

## 2023-09-26 NOTE — Therapy (Signed)
 OUTPATIENT PHYSICAL THERAPY EVALUATION   Patient Name: Jaime Rose MRN: [id number] DOB:03-Jun-1950, 73 y.o., female Today's Date: 09/26/2023  END OF SESSION:  PT End of Session - 09/25/23 1726     Visit Number 8    Number of Visits 17    Date for PT Re-Evaluation 10/12/23    Authorization Type [employer] M[employer]    PT Start Time 1645    PT Stop Time 1729    PT Time Calculation (min) 44 min    Activity Tolerance Patient tolerated treatment well    Behavior During Therapy WFL for tasks assessed/performed                Past Medical History:  Diagnosis Date   ALLERGIC RHINITIS    Allergy 1985   seasonal allergies   Arthritis    CHICKENPOX, HX OF 12/21/2009   Qualifier: Diagnosis of  By: Myrtice Athens MD, Rufina Cough    Heart murmur    MR   Hyperthyroidism    normal TFTs 11/2009 , dx 10/2011   Pre-diabetes    Varicose vein    s/p sclerosis tx summer 2013   Past Surgical History:  Procedure Laterality Date   ENDOVENOUS ABLATION SAPHENOUS VEIN W/ LASER  04/11/2012   endovenous laser ablation right greater saphenous vein and stab phlebectomy  right leg  by Ouida Bloom MD   NO PAST SURGERIES     TOTAL HIP ARTHROPLASTY Right 07/26/2023   Procedure: ARTHROPLASTY, HIP, TOTAL, ANTERIOR APPROACH;  Surgeon: Arnie Lao, MD;  Location: [facility];  Service: Orthopedics;  Laterality: Right;   Patient Active Problem List   Diagnosis Date Noted   Status post total replacement of right hip 07/26/2023   Hyperlipidemia 07/17/2023   Elevated coronary artery calcium  score 07/17/2023   Preoperative clearance 07/17/2023   Mitral regurgitation, moderate-possibly severe 07/11/2023   Vitamin D  deficiency 05/23/2023   Foot pain, left 06/02/2021   Ptosis of right eyelid 04/12/2020   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 12/29/2018   Osteopenia 01/10/2017   Carotid artery stenosis, asymptomatic, bilateral 12/21/2016   Prediabetes 12/21/2016   Hypothyroidism 03/20/2012   Varicose veins of  bilateral lower extremities with other complications 12/21/2011   Allergic rhinitis 12/21/2009   Cardiac murmur 12/21/2009     REFERRING PROVIDER: Norberto Bear, MD  REFERRING DIAG:  [phone] (ICD-10-CM) - Status post total replacement of right hip     S/p Rt THA Rationale for Evaluation and Treatment: Rehabilitation  THERAPY DIAG:  Pain in right hip  Difficulty in walking, not elsewhere classified  Chronic pain of left knee  Stiffness of left knee, not elsewhere classified  ONSET DATE: DOS 07/26/23   SUBJECTIVE:  SUBJECTIVE STATEMENT: Lateral Ltknee pain- ok if I keep pressure on it when I walk.   PERTINENT HISTORY:  none  PAIN:  Are you having pain? Yes: NPRS scale: 3 Pain location: Rt laeral hip Pain description: tight, tender Aggravating factors: walking Relieving factors: rest  PRECAUTIONS:  Anterior hip  RED FLAGS: None   WEIGHT BEARING RESTRICTIONS:  No  FALLS:  Has patient fallen in last 6 months? No  LIVING ENVIRONMENT: 4 steps to get into home, bedroom on main floor, has a second story.  OCCUPATION:  Next week to return to work   PLOF:  Independent  PATIENT GOALS:  Don my own sock and shoes. Preserve left knee to avoid TKA, play pickle ball   OBJECTIVE:  Note: Objective measures were completed at Evaluation unless otherwise noted.  PATIENT SURVEYS:  LEFS 28/80  COGNITIVE STATUS: Within functional limits for tasks assessed   SENSATION: WFL  EDEMA:  Yes: mild  POSTURE:  Lacking full hip extension bilaterally  GAIT: EVAL: antalgic lacking hip extension through stance phase/toe off   Body Part #1 Hip  PALPATION: Eval- edema and warmth  LOWER EXTREMITY ROM:     Active  Right eval Left eval Rt Lt  Standing 5/19  Hip flexion      Hip extension -12 -6 -7/-2  Hip abduction     Hip adduction     Hip internal rotation     Hip external rotation     Knee flexion     Knee extension- seated 0 -8 -5   (Blank rows = not tested)                                                                                                                                TREATMENT DATE:  5/29 Manual: Grade 2 and 3 PA and AP mobilizations to reduce pain and improve mobility of the left knee.  Significant improvement in flexion noted with manual therapy.  Trigger point release to IT band.  Reviewed self trigger point release to IT band.  Self patellar mobilization.  Mild pain noted with patellar mobilization. Patella taping.  Review of self taping.  There ex Quad set 3 x 10 SLR 3x10  SAQ 1 x 10 RPE 3 2 x 10 1.5 pounds RPE of 5 mild pain reviewed pedigree from home  Neuromuscular reeducation Red band 3 x 10 bilateral   Treatment                            5/19: Blank lines following charge title = not provided on this treatment date.   Manual:  TPDN No Ktape LCL There-ex: Seated mini SLR from hamstring stretch Iso hamstring stretch Lateral step up 4" Squats at bar- mod single leg and double leg There-Act: Sit<>stand and progress to walking with upright posture.  Self Care:  Nuro-Re-ed:  Gait Training:    5/16 Manual:  Grade 2 and 3 PA and AP mobilizations to reduce pain and improve mobility of the left knee.  Significant improvement in flexion noted with manual therapy.  Trigger point release to IT band.  Reviewed self trigger point release to IT band.  Self patellar mobilization.  Mild pain noted with patellar mobilization.  There ex Quad set 3 x 10 SLR 3x10   Neuro -re-ed  Weight shift 2x15  Standing March 2x10  cuing to keep from weight shifting Step onto air-ex 2x10  There-act:  Right leg step up 3x10 4 lbs      PATIENT EDUCATION:  Education details: Anatomy of condition, POC, HEP, exercise  form/rationale Person educated: Patient Education method: Explanation, Demonstration, Tactile cues, Verbal cues, and Handouts Education comprehension: verbalized understanding, returned demonstration, verbal cues required, tactile cues required, and needs further education  HOME EXERCISE PROGRAM: Access Code: [email or web address] URL: [email or web address]  ASSESSMENT:  CLINICAL IMPRESSION: Patient's left knee continues to limit her ability to ambulate is affecting her right hip.  She reported improved ability to flex manual therapy.  We reviewed HEP and had a great exercises.  She has limited tolerance of the left knee today.  Tedrow exercises basic.  Therapy will continue to progress as tolerated.  She will likely go soon for follow-up on her knee.  REHAB POTENTIAL: Good  CLINICAL DECISION MAKING: Stable/uncomplicated  EVALUATION COMPLEXITY: Low   GOALS: Goals reviewed with patient? Yes  SHORT TERM GOALS: Target date: 08/31/23  Sit<>stand without use of UEs Baseline: Goal status: improving but still having difficulty bending the knee. 5/16  2.  Standing hip hip to neutral extension Baseline:  Goal status: 5/16 improving     LONG TERM GOALS: Target date: POC date  Stand as lon as necessary for work with minimal discomfort Baseline:  Goal status: improving   2.  LEFS to improve by MDC x2 Baseline:  Goal status: INITIAL  3.  Hip abd strength to 80% of opposite LE Baseline:  Goal status: INITIAL  4.  Easily don socks and shoes independently Baseline:  Goal status: INITIAL  5.  Begin training for return to pickle ball Baseline:  Goal status: INITIAL     PLAN:  PT FREQUENCY: 1-2x/week  PT DURATION: POC date  PLANNED INTERVENTIONS: 97164- PT Re-evaluation, 97750- Physical Performance Testing, 97110-Therapeutic exercises, 97530- Therapeutic activity, 97112- Neuromuscular re-education, 97535- Self Care, 40981- Manual therapy, [phone]- Gait training, [phone]-  Aquatic Therapy, Patient/Family education, Balance training, Stair training, Taping, Dry Needling, Joint mobilization, Spinal mobilization, Scar mobilization, Cryotherapy, and Moist heat.  PLAN FOR NEXT SESSION: continue flexibility and strength to reduce post pelvic tilt.   Jessica C. Hightower PT, DPT 09/26/23 8:29 AM   Referring diagnosis?  O13.086 (ICD-10-CM) - Status post total replacement of right hip    Treatment diagnosis? (if different than referring diagnosis) M25.551 What was this (referring dx) caused by? [x]  Surgery []  Fall []  Ongoing issue []  Arthritis []  Other: ____________  Laterality: [x]  Rt []  Lt []  Both  Check all possible CPT codes:  *CHOOSE 10 OR LESS*    See Planned Interventions listed in the Plan section of the Evaluation.

## 2023-09-27 ENCOUNTER — Ambulatory Visit: Payer: Self-pay | Admitting: Internal Medicine

## 2023-09-27 ENCOUNTER — Other Ambulatory Visit (INDEPENDENT_AMBULATORY_CARE_PROVIDER_SITE_OTHER)

## 2023-09-27 DIAGNOSIS — E039 Hypothyroidism, unspecified: Secondary | ICD-10-CM

## 2023-09-27 LAB — TSH: TSH: 0.97 u[IU]/mL (ref 0.35–5.50)

## 2023-10-02 ENCOUNTER — Ambulatory Visit (HOSPITAL_BASED_OUTPATIENT_CLINIC_OR_DEPARTMENT_OTHER): Attending: Orthopaedic Surgery | Admitting: Physical Therapy

## 2023-10-02 DIAGNOSIS — R262 Difficulty in walking, not elsewhere classified: Secondary | ICD-10-CM

## 2023-10-02 DIAGNOSIS — R2689 Other abnormalities of gait and mobility: Secondary | ICD-10-CM

## 2023-10-02 DIAGNOSIS — M6281 Muscle weakness (generalized): Secondary | ICD-10-CM | POA: Diagnosis not present

## 2023-10-02 DIAGNOSIS — M25662 Stiffness of left knee, not elsewhere classified: Secondary | ICD-10-CM

## 2023-10-02 DIAGNOSIS — M25551 Pain in right hip: Secondary | ICD-10-CM | POA: Diagnosis not present

## 2023-10-02 DIAGNOSIS — G8929 Other chronic pain: Secondary | ICD-10-CM

## 2023-10-02 DIAGNOSIS — M25562 Pain in left knee: Secondary | ICD-10-CM | POA: Diagnosis not present

## 2023-10-03 ENCOUNTER — Encounter (HOSPITAL_BASED_OUTPATIENT_CLINIC_OR_DEPARTMENT_OTHER): Payer: Self-pay | Admitting: Physical Therapy

## 2023-10-03 NOTE — Therapy (Signed)
 OUTPATIENT PHYSICAL THERAPY EVALUATION   Patient Name: Jaime Rose MRN: [id number] DOB:1950-07-25, 73 y.o., female Today's Date: 10/03/2023  END OF SESSION:  PT End of Session - 10/03/23 1337     Visit Number 9    Number of Visits 17    Date for PT Re-Evaluation 10/12/23    Authorization Type [employer] M[employer]    PT Start Time 1605    PT Stop Time 1646    PT Time Calculation (min) 41 min    Activity Tolerance Patient tolerated treatment well    Behavior During Therapy WFL for tasks assessed/performed                Past Medical History:  Diagnosis Date   ALLERGIC RHINITIS    Allergy 1985   seasonal allergies   Arthritis    CHICKENPOX, HX OF 12/21/2009   Qualifier: Diagnosis of  By: Myrtice Athens MD, Rufina Cough    Heart murmur    MR   Hyperthyroidism    normal TFTs 11/2009 , dx 10/2011   Pre-diabetes    Varicose vein    s/p sclerosis tx summer 2013   Past Surgical History:  Procedure Laterality Date   ENDOVENOUS ABLATION SAPHENOUS VEIN W/ LASER  04/11/2012   endovenous laser ablation right greater saphenous vein and stab phlebectomy  right leg  by Ouida Bloom MD   NO PAST SURGERIES     TOTAL HIP ARTHROPLASTY Right 07/26/2023   Procedure: ARTHROPLASTY, HIP, TOTAL, ANTERIOR APPROACH;  Surgeon: Arnie Lao, MD;  Location: [facility];  Service: Orthopedics;  Laterality: Right;   Patient Active Problem List   Diagnosis Date Noted   Status post total replacement of right hip 07/26/2023   Hyperlipidemia 07/17/2023   Elevated coronary artery calcium  score 07/17/2023   Preoperative clearance 07/17/2023   Mitral regurgitation, moderate-possibly severe 07/11/2023   Vitamin D  deficiency 05/23/2023   Foot pain, left 06/02/2021   Ptosis of right eyelid 04/12/2020   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 12/29/2018   Osteopenia 01/10/2017   Carotid artery stenosis, asymptomatic, bilateral 12/21/2016   Prediabetes 12/21/2016   Hypothyroidism 03/20/2012   Varicose veins of  bilateral lower extremities with other complications 12/21/2011   Allergic rhinitis 12/21/2009   Cardiac murmur 12/21/2009     REFERRING PROVIDER: Norberto Bear, MD  REFERRING DIAG:  [phone] (ICD-10-CM) - Status post total replacement of right hip     S/p Rt THA Rationale for Evaluation and Treatment: Rehabilitation  THERAPY DIAG:  Pain in right hip  Difficulty in walking, not elsewhere classified  Chronic pain of left knee  Stiffness of left knee, not elsewhere classified  Muscle weakness (generalized)  Other abnormalities of gait and mobility  ONSET DATE: DOS 07/26/23   SUBJECTIVE:  SUBJECTIVE STATEMENT: The patient reports her knee has been doing a little better. She feels like the tape helped. She has not been able to get the tape yet. She feels like her hip is doing well.  PERTINENT HISTORY:  none  PAIN:  Are you having pain? Yes: NPRS scale: 3 Pain location: Rt laeral hip Pain description: tight, tender Aggravating factors: walking Relieving factors: rest  PRECAUTIONS:  Anterior hip  RED FLAGS: None   WEIGHT BEARING RESTRICTIONS:  No  FALLS:  Has patient fallen in last 6 months? No  LIVING ENVIRONMENT: 4 steps to get into home, bedroom on main floor, has a second story.  OCCUPATION:  Next week to return to work   PLOF:  Independent  PATIENT GOALS:  Don my own sock and shoes. Preserve left knee to avoid TKA, play pickle ball   OBJECTIVE:  Note: Objective measures were completed at Evaluation unless otherwise noted.  PATIENT SURVEYS:  LEFS 28/80  COGNITIVE STATUS: Within functional limits for tasks assessed   SENSATION: WFL  EDEMA:  Yes: mild  POSTURE:  Lacking full hip extension bilaterally  GAIT: EVAL: antalgic lacking hip extension  through stance phase/toe off   Body Part #1 Hip  PALPATION: Eval- edema and warmth  LOWER EXTREMITY ROM:     Active  Right eval Left eval Rt Lt  Standing 5/19  Hip flexion     Hip extension -12 -6 -7/-2  Hip abduction     Hip adduction     Hip internal rotation     Hip external rotation     Knee flexion     Knee extension- seated 0 -8 -5   (Blank rows = not tested)                                                                                                                                TREATMENT DATE:  6/4  Manual: Grade 2 and 3 PA and AP mobilizations to reduce pain and improve mobility of the left knee.  Significant improvement in flexion noted with manual therapy.  Trigger point release to IT band.  Reviewed self trigger point release to IT band.  Self patellar mobilization.  Mild pain noted with patellar mobilization. Patella taping.  Review of self taping.  Neuro-re-ed:  Bridge 3x10  SLR left only 3x10   There-act:  Step up 4 inch 2x10 each leg 6 inch right only  Step down. Tried 4 inch but moved to 3 inch  Side step 4 inch with right 2x10      5/29 Manual: Grade 2 and 3 PA and AP mobilizations to reduce pain and improve mobility of the left knee.  Significant improvement in flexion noted with manual therapy.  Trigger point release to IT band.  Reviewed self trigger point release to IT band.  Self patellar mobilization.  Mild pain noted with patellar mobilization. Patella taping.  Review of self taping.  There  ex Quad set 3 x 10 SLR 3x10  SAQ 1 x 10 RPE 3 2 x 10 1.5 pounds RPE of 5 mild pain reviewed pedigree from home  Neuromuscular reeducation Red band 3 x 10 bilateral   Treatment                            5/19: Blank lines following charge title = not provided on this treatment date.   Manual:  TPDN No Ktape LCL There-ex: Seated mini SLR from hamstring stretch Iso hamstring stretch Lateral step up 4" Squats at bar- mod single leg and  double leg There-Act: Sit<>stand and progress to walking with upright posture.  Self Care:  Nuro-Re-ed:  Gait Training:    5/16 Manual: Grade 2 and 3 PA and AP mobilizations to reduce pain and improve mobility of the left knee.  Significant improvement in flexion noted with manual therapy.  Trigger point release to IT band.  Reviewed self trigger point release to IT band.  Self patellar mobilization.  Mild pain noted with patellar mobilization.  There ex Quad set 3 x 10 SLR 3x10   Neuro -re-ed  Weight shift 2x15  Standing March 2x10  cuing to keep from weight shifting Step onto air-ex 2x10  There-act:  Right leg step up 3x10 4 lbs      PATIENT EDUCATION:  Education details: Anatomy of condition, POC, HEP, exercise form/rationale Person educated: Patient Education method: Explanation, Demonstration, Tactile cues, Verbal cues, and Handouts Education comprehension: verbalized understanding, returned demonstration, verbal cues required, tactile cues required, and needs further education  HOME EXERCISE PROGRAM: Access Code: [email or web address] URL: [email or web address]  ASSESSMENT:  CLINICAL IMPRESSION: Therapy focused more on right LE functional strengthening today. We also worked on left LE pain reduction and left LE strengthening. She tolerated well. Her right hip is progressing very well. Her knee is improving but is still limiting her overall mobility. We reviewed self taping today. Therapy will continue to progress as tolerated.   REHAB POTENTIAL: Good  CLINICAL DECISION MAKING: Stable/uncomplicated  EVALUATION COMPLEXITY: Low   GOALS: Goals reviewed with patient? Yes  SHORT TERM GOALS: Target date: 08/31/23  Sit<>stand without use of UEs Baseline: Goal status: improving but still having difficulty bending the knee. 5/16  2.  Standing hip hip to neutral extension Baseline:  Goal status: 5/16 improving     LONG TERM GOALS: Target date: POC  date  Stand as lon as necessary for work with minimal discomfort Baseline:  Goal status: improving   2.  LEFS to improve by MDC x2 Baseline:  Goal status: INITIAL  3.  Hip abd strength to 80% of opposite LE Baseline:  Goal status: INITIAL  4.  Easily don socks and shoes independently Baseline:  Goal status: INITIAL  5.  Begin training for return to pickle ball Baseline:  Goal status: INITIAL     PLAN:  PT FREQUENCY: 1-2x/week  PT DURATION: POC date  PLANNED INTERVENTIONS: 97164- PT Re-evaluation, 97750- Physical Performance Testing, 97110-Therapeutic exercises, 97530- Therapeutic activity, 97112- Neuromuscular re-education, 97535- Self Care, 16109- Manual therapy, [phone]- Gait training, [phone]- Aquatic Therapy, Patient/Family education, Balance training, Stair training, Taping, Dry Needling, Joint mobilization, Spinal mobilization, Scar mobilization, Cryotherapy, and Moist heat.  PLAN FOR NEXT SESSION: continue flexibility and strength to reduce post pelvic tilt.   Jessica C. Hightower PT, DPT 10/03/23 1:39 PM   Referring diagnosis?  B14.782 (ICD-10-CM) - Status post total replacement of right hip  Treatment diagnosis? (if different than referring diagnosis) M25.551 What was this (referring dx) caused by? [x]  Surgery []  Fall []  Ongoing issue []  Arthritis []  Other: ____________  Laterality: [x]  Rt []  Lt []  Both  Check all possible CPT codes:  *CHOOSE 10 OR LESS*    See Planned Interventions listed in the Plan section of the Evaluation.

## 2023-10-04 ENCOUNTER — Ambulatory Visit (HOSPITAL_BASED_OUTPATIENT_CLINIC_OR_DEPARTMENT_OTHER): Admitting: Physical Therapy

## 2023-10-04 DIAGNOSIS — M25662 Stiffness of left knee, not elsewhere classified: Secondary | ICD-10-CM | POA: Diagnosis not present

## 2023-10-04 DIAGNOSIS — G8929 Other chronic pain: Secondary | ICD-10-CM | POA: Diagnosis not present

## 2023-10-04 DIAGNOSIS — R262 Difficulty in walking, not elsewhere classified: Secondary | ICD-10-CM

## 2023-10-04 DIAGNOSIS — M6281 Muscle weakness (generalized): Secondary | ICD-10-CM

## 2023-10-04 DIAGNOSIS — R2689 Other abnormalities of gait and mobility: Secondary | ICD-10-CM

## 2023-10-04 DIAGNOSIS — M25551 Pain in right hip: Secondary | ICD-10-CM | POA: Diagnosis not present

## 2023-10-04 DIAGNOSIS — M25562 Pain in left knee: Secondary | ICD-10-CM | POA: Diagnosis not present

## 2023-10-04 NOTE — Therapy (Addendum)
 OUTPATIENT PHYSICAL THERAPY / Progress note    Patient Name: Jaime Rose MRN: [id number] DOB:1950-12-27, 73 y.o., female Today's Date: 10/07/2023  END OF SESSION:  PT End of Session - 10/06/23 0902     Visit Number 10    Number of Visits 26    Date for PT Re-Evaluation 12/01/23    Authorization Type [employer] MCR    PT Start Time 0930    PT Stop Time 1012    PT Time Calculation (min) 42 min    Activity Tolerance Patient tolerated treatment well    Behavior During Therapy WFL for tasks assessed/performed                 Past Medical History:  Diagnosis Date   ALLERGIC RHINITIS    Allergy 1985   seasonal allergies   Arthritis    CHICKENPOX, HX OF 12/21/2009   Qualifier: Diagnosis of  By: Myrtice Athens MD, Rufina Cough    Heart murmur    MR   Hyperthyroidism    normal TFTs 11/2009 , dx 10/2011   Pre-diabetes    Varicose vein    s/p sclerosis tx summer 2013   Past Surgical History:  Procedure Laterality Date   ENDOVENOUS ABLATION SAPHENOUS VEIN W/ LASER  04/11/2012   endovenous laser ablation right greater saphenous vein and stab phlebectomy  right leg  by Ouida Bloom MD   NO PAST SURGERIES     TOTAL HIP ARTHROPLASTY Right 07/26/2023   Procedure: ARTHROPLASTY, HIP, TOTAL, ANTERIOR APPROACH;  Surgeon: Arnie Lao, MD;  Location: [facility];  Service: Orthopedics;  Laterality: Right;   Patient Active Problem List   Diagnosis Date Noted   Status post total replacement of right hip 07/26/2023   Hyperlipidemia 07/17/2023   Elevated coronary artery calcium  score 07/17/2023   Preoperative clearance 07/17/2023   Mitral regurgitation, moderate-possibly severe 07/11/2023   Vitamin D  deficiency 05/23/2023   Foot pain, left 06/02/2021   Ptosis of right eyelid 04/12/2020   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 12/29/2018   Osteopenia 01/10/2017   Carotid artery stenosis, asymptomatic, bilateral 12/21/2016   Prediabetes 12/21/2016   Hypothyroidism 03/20/2012   Varicose veins  of bilateral lower extremities with other complications 12/21/2011   Allergic rhinitis 12/21/2009   Cardiac murmur 12/21/2009  Progress Note Reporting Period 08/12/2023 to 10/04/2023  See note below for Objective Data and Assessment of Progress/Goals.       REFERRING PROVIDER: Norberto Bear, MD  REFERRING DIAG:  [phone] (ICD-10-CM) - Status post total replacement of right hip     S/p Rt THA Rationale for Evaluation and Treatment: Rehabilitation  THERAPY DIAG:  Pain in right hip - Plan: PT plan of care cert/re-cert  Difficulty in walking, not elsewhere classified - Plan: PT plan of care cert/re-cert  Chronic pain of left knee - Plan: PT plan of care cert/re-cert  Stiffness of left knee, not elsewhere classified - Plan: PT plan of care cert/re-cert  Muscle weakness (generalized) - Plan: PT plan of care cert/re-cert  Other abnormalities of gait and mobility - Plan: PT plan of care cert/re-cert  ONSET DATE: DOS 07/26/23   SUBJECTIVE:  SUBJECTIVE STATEMENT: The patient reports her knee has been doing a little better. She feels like the tape helped. She has not been able to get the tape yet. She feels like her hip is doing well.  PERTINENT HISTORY:  none  PAIN:  Are you having pain? Yes: NPRS scale: 3 Pain location: Rt laeral hip Pain description: tight, tender Aggravating factors: walking Relieving factors: rest  PRECAUTIONS:  Anterior hip  RED FLAGS: None   WEIGHT BEARING RESTRICTIONS:  No  FALLS:  Has patient fallen in last 6 months? No  LIVING ENVIRONMENT: 4 steps to get into home, bedroom on main floor, has a second story.  OCCUPATION:  Next week to return to work   PLOF:  Independent  PATIENT GOALS:  Don my own sock and shoes. Preserve left knee to avoid TKA,  play pickle ball   OBJECTIVE:  Note: Objective measures were completed at Evaluation unless otherwise noted.  PATIENT SURVEYS:  LEFS 28/80  COGNITIVE STATUS: Within functional limits for tasks assessed   SENSATION: WFL  EDEMA:  Yes: mild  POSTURE:  Lacking full hip extension bilaterally  GAIT: EVAL: antalgic lacking hip extension through stance phase/toe off   Body Part #1 Hip  PALPATION: Eval- edema and warmth  LOWER EXTREMITY ROM:     Active  Right eval Left eval Rt Lt  Standing 5/19 Right  6/6  Left 6/6  Hip flexion    105   Hip extension -12 -6 -7/-2    Hip abduction       Hip adduction       Hip internal rotation       Hip external rotation    40   Knee flexion       Knee extension 0 -8 -5  -5   (Blank rows = not tested)     Gross strength   Left/ right  flexion 4/4 Hip abduction 4+/4  Knee extension 4+/4                                                                                                                           TREATMENT DATE:  6/6 Manual: Grade 2 and 3 PA and AP mobilizations to reduce pain and improve mobility of the left knee.  Significant improvement in flexion noted with manual therapy.  Trigger point release to IT band.  Reviewed self trigger point release to IT band.  Self patellar mobilization.   There-ex:  LAQ: Right 2 lb 3x15  Left 3x10 no weight   Neuro-re-ed:  Step onto air-ex 2x10 each leg  Heel/toe rock 3x12  Slow march 3x10  1/3 squat with min cuing for technique 3x10   6/4  Manual: Grade 2 and 3 PA and AP mobilizations to reduce pain and improve mobility of the left knee.  Significant improvement in flexion noted with manual therapy.  Trigger point release to IT band.  Reviewed self trigger point release to IT band.  Self patellar mobilization.  Mild pain noted with patellar mobilization. Patella taping.  Review of self taping.  Neuro-re-ed:  Bridge 3x10  SLR left only 3x10   There-act:  Step up 4  inch 2x10 each leg 6 inch right only  Step down. Tried 4 inch but moved to 3 inch  Side step 4 inch with right 2x10      5/29 Manual: Grade 2 and 3 PA and AP mobilizations to reduce pain and improve mobility of the left knee.  Significant improvement in flexion noted with manual therapy.  Trigger point release to IT band.  Reviewed self trigger point release to IT band.  Self patellar mobilization.  Mild pain noted with patellar mobilization. Patella taping.  Review of self taping.  There ex Quad set 3 x 10 SLR 3x10  SAQ 1 x 10 RPE 3 2 x 10 1.5 pounds RPE of 5 mild pain reviewed pedigree from home  Neuromuscular reeducation Red band 3 x 10 bilateral   Treatment                            5/19: Blank lines following charge title = not provided on this treatment date.   Manual:  TPDN No Ktape LCL There-ex: Seated mini SLR from hamstring stretch Iso hamstring stretch Lateral step up 4" Squats at bar- mod single leg and double leg There-Act: Sit<>stand and progress to walking with upright posture.  Self Care:  Nuro-Re-ed:  Gait Training:      PATIENT EDUCATION:  Education details: Teacher, music of condition, POC, HEP, exercise form/rationale Person educated: Patient Education method: Explanation, Demonstration, Tactile cues, Verbal cues, and Handouts Education comprehension: verbalized understanding, returned demonstration, verbal cues required, tactile cues required, and needs further education  HOME EXERCISE PROGRAM: Access Code: [email or web address] URL: [email or web address]  ASSESSMENT:  CLINICAL IMPRESSION: Therapy worked on squatting with the patient today. She was concerned about squatting with the left LE. She was shown how to do it with proper technique. She did well. She was not able to go down very far but she was able to do it. We reviewed strengthening for bilateral LE. We continue to work on improving knee extenstion. Therapy will continue to progress  as tolerated. Overall her hip is doing very well. We continue to work on functional strengthening of her hip. Her knee has been more limiting.    REHAB POTENTIAL: Good  CLINICAL DECISION MAKING: Stable/uncomplicated  EVALUATION COMPLEXITY: Low   GOALS: Goals reviewed with patient? Yes  SHORT TERM GOALS: Target date: 08/31/23  Sit<>stand without use of UEs Baseline: Goal status: improving but still having difficulty bending the knee. 5/16  2.  Standing hip hip to neutral extension Baseline:  Goal status: achieved 6/6    LONG TERM GOALS: Target date: POC date  Stand as lon as necessary for work with minimal discomfort Baseline:  Goal status: improving   2.  LEFS to improve by MDC x2 Baseline:  Goal status: progressing 6/8  3.  Hip abd strength to 80% of opposite LE Baseline:  Goal status: improving 6/6 4.  Easily don socks and shoes independently Baseline:  Goal status: INITIAL  5.  Begin training for return to pickle ball Baseline:  Goal status: has not returned 6/6     PLAN:  PT FREQUENCY: 1-2x/week  PT DURATION:8 weeks  PLANNED INTERVENTIONS: 97164- PT Re-evaluation, 97750- Physical Performance Testing, 97110-Therapeutic exercises, 97530- Therapeutic activity, [id number]- Neuromuscular re-education, 97535- Self Care, [id number]- Manual therapy, [id number]- Gait  training, 40981- Aquatic Therapy, Patient/Family education, Balance training, Stair training, Taping, Dry Needling, Joint mobilization, Spinal mobilization, Scar mobilization, Cryotherapy, and Moist heat.  PLAN FOR NEXT SESSION: continue flexibility and strength to reduce post pelvic tilt.  Signa Drier PT DPT 10/07/23 7:59 AM   Referring diagnosis?  X91.478 (ICD-10-CM) - Status post total replacement of right hip    Treatment diagnosis? (if different than referring diagnosis) M25.551 What was this (referring dx) caused by? [x]  Surgery []  Fall []  Ongoing issue []  Arthritis []  Other:  ____________  Laterality: [x]  Rt []  Lt []  Both  Check all possible CPT codes:  *CHOOSE 10 OR LESS*    See Planned Interventions listed in the Plan section of the Evaluation.

## 2023-10-06 ENCOUNTER — Encounter (HOSPITAL_BASED_OUTPATIENT_CLINIC_OR_DEPARTMENT_OTHER): Payer: Self-pay | Admitting: Physical Therapy

## 2023-10-07 ENCOUNTER — Encounter (HOSPITAL_BASED_OUTPATIENT_CLINIC_OR_DEPARTMENT_OTHER): Payer: Self-pay | Admitting: Physical Therapy

## 2023-10-07 ENCOUNTER — Ambulatory Visit (HOSPITAL_BASED_OUTPATIENT_CLINIC_OR_DEPARTMENT_OTHER): Admitting: Physical Therapy

## 2023-10-07 DIAGNOSIS — M25662 Stiffness of left knee, not elsewhere classified: Secondary | ICD-10-CM | POA: Diagnosis not present

## 2023-10-07 DIAGNOSIS — R2689 Other abnormalities of gait and mobility: Secondary | ICD-10-CM | POA: Diagnosis not present

## 2023-10-07 DIAGNOSIS — M25562 Pain in left knee: Secondary | ICD-10-CM | POA: Diagnosis not present

## 2023-10-07 DIAGNOSIS — R262 Difficulty in walking, not elsewhere classified: Secondary | ICD-10-CM | POA: Diagnosis not present

## 2023-10-07 DIAGNOSIS — M25551 Pain in right hip: Secondary | ICD-10-CM

## 2023-10-07 DIAGNOSIS — G8929 Other chronic pain: Secondary | ICD-10-CM

## 2023-10-07 DIAGNOSIS — M6281 Muscle weakness (generalized): Secondary | ICD-10-CM | POA: Diagnosis not present

## 2023-10-07 NOTE — Therapy (Signed)
 OUTPATIENT PHYSICAL THERAPY / Progress note    Patient Name: Jaime Rose MRN: [id number] DOB:13-Jul-1950, 73 y.o., female Today's Date: 10/07/2023  END OF SESSION:  PT End of Session - 10/07/23 1036     Visit Number 11    Number of Visits 26    Date for PT Re-Evaluation 12/01/23    Authorization Type [employer] MCR    PT Start Time 1018    PT Stop Time 1058    PT Time Calculation (min) 40 min    Activity Tolerance Patient tolerated treatment well    Behavior During Therapy WFL for tasks assessed/performed                 Past Medical History:  Diagnosis Date   ALLERGIC RHINITIS    Allergy 1985   seasonal allergies   Arthritis    CHICKENPOX, HX OF 12/21/2009   Qualifier: Diagnosis of  By: Myrtice Athens MD, Rufina Cough    Heart murmur    MR   Hyperthyroidism    normal TFTs 11/2009 , dx 10/2011   Pre-diabetes    Varicose vein    s/p sclerosis tx summer 2013   Past Surgical History:  Procedure Laterality Date   ENDOVENOUS ABLATION SAPHENOUS VEIN W/ LASER  04/11/2012   endovenous laser ablation right greater saphenous vein and stab phlebectomy  right leg  by Ouida Bloom MD   NO PAST SURGERIES     TOTAL HIP ARTHROPLASTY Right 07/26/2023   Procedure: ARTHROPLASTY, HIP, TOTAL, ANTERIOR APPROACH;  Surgeon: Arnie Lao, MD;  Location: [facility];  Service: Orthopedics;  Laterality: Right;   Patient Active Problem List   Diagnosis Date Noted   Status post total replacement of right hip 07/26/2023   Hyperlipidemia 07/17/2023   Elevated coronary artery calcium  score 07/17/2023   Preoperative clearance 07/17/2023   Mitral regurgitation, moderate-possibly severe 07/11/2023   Vitamin D  deficiency 05/23/2023   Foot pain, left 06/02/2021   Ptosis of right eyelid 04/12/2020   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 12/29/2018   Osteopenia 01/10/2017   Carotid artery stenosis, asymptomatic, bilateral 12/21/2016   Prediabetes 12/21/2016   Hypothyroidism 03/20/2012   Varicose veins  of bilateral lower extremities with other complications 12/21/2011   Allergic rhinitis 12/21/2009   Cardiac murmur 12/21/2009  Progress Note Reporting Period 08/12/2023 to 10/04/2023  See note below for Objective Data and Assessment of Progress/Goals.       REFERRING PROVIDER: Norberto Bear, MD  REFERRING DIAG:  [phone] (ICD-10-CM) - Status post total replacement of right hip     S/p Rt THA Rationale for Evaluation and Treatment: Rehabilitation  THERAPY DIAG:  Pain in right hip  Difficulty in walking, not elsewhere classified  ONSET DATE: DOS 07/26/23   SUBJECTIVE:  SUBJECTIVE STATEMENT: The patient reports her knee has been doing a little better. She feels like the tape helped. She has not been able to get the tape yet. She feels like her hip is doing well.  PERTINENT HISTORY:  none  PAIN:  Are you having pain? Yes: NPRS scale: 3 Pain location: Rt laeral hip Pain description: tight, tender Aggravating factors: walking Relieving factors: rest  PRECAUTIONS:  Anterior hip  RED FLAGS: None   WEIGHT BEARING RESTRICTIONS:  No  FALLS:  Has patient fallen in last 6 months? No  LIVING ENVIRONMENT: 4 steps to get into home, bedroom on main floor, has a second story.  OCCUPATION:  Next week to return to work   PLOF:  Independent  PATIENT GOALS:  Don my own sock and shoes. Preserve left knee to avoid TKA, play pickle ball   OBJECTIVE:  Note: Objective measures were completed at Evaluation unless otherwise noted.  PATIENT SURVEYS:  LEFS 28/80  COGNITIVE STATUS: Within functional limits for tasks assessed   SENSATION: WFL  EDEMA:  Yes: mild  POSTURE:  Lacking full hip extension bilaterally  GAIT: EVAL: antalgic lacking hip extension through stance phase/toe  off   Body Part #1 Hip  PALPATION: Eval- edema and warmth  LOWER EXTREMITY ROM:     Active  Right eval Left eval Rt Lt  Standing 5/19 Right  6/6  Left 6/6  Hip flexion    105   Hip extension -12 -6 -7/-2    Hip abduction       Hip adduction       Hip internal rotation       Hip external rotation    40   Knee flexion       Knee extension 0 -8 -5  -5   (Blank rows = not tested)     Gross strength   Left/ right  flexion 4/4 Hip abduction 4+/4  Knee extension 4+/4                                                                                                                           TREATMENT DATE:  6/9 Manual: Grade 2 and 3 PA and AP mobilizations to reduce pain and improve mobility of the left knee.  Significant improvement in flexion noted with manual therapy.  Trigger point release to IT band.  Reviewed self trigger point release to IT band.  Self patellar mobilization.   There-ex:  SLR 3x10  L only   SAQ 3x12  2lb 4 lb and 5 lb with progressive RPE to 5   LAQ 5 lb  3x12 RPE of 6  Nustep 5 min     There-act:  Step up  3x15 each leg 6 inch right only   Neuro re-ed  Step onto air-ex 2x12    6/6 Manual: Grade 2 and 3 PA and AP mobilizations to reduce pain and improve mobility of the left knee.  Significant improvement in flexion noted  with manual therapy.  Trigger point release to IT band.  Reviewed self trigger point release to IT band.  Self patellar mobilization.   There-ex:  LAQ: Right 2 lb 3x15  Left 3x10 no weight   Neuro-re-ed:  Step onto air-ex 2x10 each leg  Heel/toe rock 3x12  Slow march 3x10  1/3 squat with min cuing for technique 3x10   6/4  Manual: Grade 2 and 3 PA and AP mobilizations to reduce pain and improve mobility of the left knee.  Significant improvement in flexion noted with manual therapy.  Trigger point release to IT band.  Reviewed self trigger point release to IT band.  Self patellar mobilization.  Mild pain noted  with patellar mobilization. Patella taping.  Review of self taping.  Neuro-re-ed:  Bridge 3x10  SLR left only 3x10   There-act:  Step up 4 inch 2x10 each leg 6 inch right only  Step down. Tried 4 inch but moved to 3 inch  Side step 4 inch with right 2x10      5/29 Manual: Grade 2 and 3 PA and AP mobilizations to reduce pain and improve mobility of the left knee.  Significant improvement in flexion noted with manual therapy.  Trigger point release to IT band.  Reviewed self trigger point release to IT band.  Self patellar mobilization.  Mild pain noted with patellar mobilization. Patella taping.  Review of self taping.  There ex Quad set 3 x 10 SLR 3x10  SAQ 1 x 10 RPE 3 2 x 10 1.5 pounds RPE of 5 mild pain reviewed pedigree from home  Neuromuscular reeducation Red band 3 x 10 bilateral   Treatment                            5/19: Blank lines following charge title = not provided on this treatment date.   Manual:  TPDN No Ktape LCL There-ex: Seated mini SLR from hamstring stretch Iso hamstring stretch Lateral step up 4" Squats at bar- mod single leg and double leg There-Act: Sit<>stand and progress to walking with upright posture.  Self Care:  Nuro-Re-ed:  Gait Training:      PATIENT EDUCATION:  Education details: Teacher, music of condition, POC, HEP, exercise form/rationale Person educated: Patient Education method: Explanation, Demonstration, Tactile cues, Verbal cues, and Handouts Education comprehension: verbalized understanding, returned demonstration, verbal cues required, tactile cues required, and needs further education  HOME EXERCISE PROGRAM: Access Code: [email or web address] URL: [email or web address]  ASSESSMENT:  CLINICAL IMPRESSION: The patients right hip continues to progress well. We loaded her quad strengthening her exercises. We were able to load her quad all the way to 5 lbs before reaching desired RPE. Her left knee remains limiting. We  worked on instability exercises with both knees. She had more difficulty with the left. Her patella motion is improving. We will continue to advance right hip functional strengthening. We reviewed technique with squats. She did well with squats.   REHAB POTENTIAL: Good  CLINICAL DECISION MAKING: Stable/uncomplicated  EVALUATION COMPLEXITY: Low   GOALS: Goals reviewed with patient? Yes  SHORT TERM GOALS: Target date: 08/31/23  Sit<>stand without use of UEs Baseline: Goal status: improving but still having difficulty bending the knee. 5/16  2.  Standing hip hip to neutral extension Baseline:  Goal status: achieved 6/6    LONG TERM GOALS: Target date: POC date  Stand as lon as necessary for work with minimal discomfort Baseline:  Goal status: improving  2.  LEFS to improve by MDC x2 Baseline:  Goal status: progressing 6/8  3.  Hip abd strength to 80% of opposite LE Baseline:  Goal status: improving 6/6 4.  Easily don socks and shoes independently Baseline:  Goal status: INITIAL  5.  Begin training for return to pickle ball Baseline:  Goal status: has not returned 6/6     PLAN:  PT FREQUENCY: 1-2x/week  PT DURATION:8 weeks  PLANNED INTERVENTIONS: 97164- PT Re-evaluation, 97750- Physical Performance Testing, 97110-Therapeutic exercises, 97530- Therapeutic activity, 97112- Neuromuscular re-education, 97535- Self Care, 16109- Manual therapy, [phone]- Gait training, [phone]- Aquatic Therapy, Patient/Family education, Balance training, Stair training, Taping, Dry Needling, Joint mobilization, Spinal mobilization, Scar mobilization, Cryotherapy, and Moist heat.  PLAN FOR NEXT SESSION: continue flexibility and strength to reduce post pelvic tilt.  Signa Drier PT DPT 10/07/23 10:38 AM   Referring diagnosis?  B14.782 (ICD-10-CM) - Status post total replacement of right hip    Treatment diagnosis? (if different than referring diagnosis) M25.551 What was this (referring  dx) caused by? [x]  Surgery []  Fall []  Ongoing issue []  Arthritis []  Other: ____________  Laterality: [x]  Rt []  Lt []  Both  Check all possible CPT codes:  *CHOOSE 10 OR LESS*    See Planned Interventions listed in the Plan section of the Evaluation.

## 2023-10-08 ENCOUNTER — Other Ambulatory Visit: Payer: Self-pay | Admitting: Internal Medicine

## 2023-10-09 ENCOUNTER — Encounter (HOSPITAL_BASED_OUTPATIENT_CLINIC_OR_DEPARTMENT_OTHER): Payer: Self-pay | Admitting: Physical Therapy

## 2023-10-09 ENCOUNTER — Ambulatory Visit (HOSPITAL_BASED_OUTPATIENT_CLINIC_OR_DEPARTMENT_OTHER): Admitting: Physical Therapy

## 2023-10-09 DIAGNOSIS — M6281 Muscle weakness (generalized): Secondary | ICD-10-CM | POA: Diagnosis not present

## 2023-10-09 DIAGNOSIS — M25551 Pain in right hip: Secondary | ICD-10-CM | POA: Diagnosis not present

## 2023-10-09 DIAGNOSIS — M25662 Stiffness of left knee, not elsewhere classified: Secondary | ICD-10-CM | POA: Diagnosis not present

## 2023-10-09 DIAGNOSIS — R262 Difficulty in walking, not elsewhere classified: Secondary | ICD-10-CM | POA: Diagnosis not present

## 2023-10-09 DIAGNOSIS — M25562 Pain in left knee: Secondary | ICD-10-CM | POA: Diagnosis not present

## 2023-10-09 DIAGNOSIS — G8929 Other chronic pain: Secondary | ICD-10-CM | POA: Diagnosis not present

## 2023-10-09 DIAGNOSIS — R2689 Other abnormalities of gait and mobility: Secondary | ICD-10-CM | POA: Diagnosis not present

## 2023-10-09 NOTE — Therapy (Signed)
 OUTPATIENT PHYSICAL THERAPY / Progress note    Patient Name: Jaime Rose MRN: [id number] DOB:1950-09-11, 73 y.o., female Today's Date: 10/09/2023  END OF SESSION:  PT End of Session - 10/09/23 1647     Visit Number 12    Number of Visits 26    Date for PT Re-Evaluation 12/01/23    Authorization Type [employer] M[employer]    PT Start Time 0315    PT Stop Time 0358    PT Time Calculation (min) 43 min    Activity Tolerance Patient tolerated treatment well    Behavior During Therapy [facility] for tasks assessed/performed                 Past Medical History:  Diagnosis Date   ALLERGIC RHINITIS    Allergy 1985   seasonal allergies   Arthritis    CHICKENPOX, HX OF 12/21/2009   Qualifier: Diagnosis of  By: Myrtice Athens MD, Rufina Cough    Heart murmur    MR   Hyperthyroidism    normal TFTs 11/2009 , dx 10/2011   Pre-diabetes    Varicose vein    s/p sclerosis tx summer 2013   Past Surgical History:  Procedure Laterality Date   ENDOVENOUS ABLATION SAPHENOUS VEIN W/ LASER  04/11/2012   endovenous laser ablation right greater saphenous vein and stab phlebectomy  right leg  by Ouida Bloom MD   NO PAST SURGERIES     TOTAL HIP ARTHROPLASTY Right 07/26/2023   Procedure: ARTHROPLASTY, HIP, TOTAL, ANTERIOR APPROACH;  Surgeon: Arnie Lao, MD;  Location: [facility];  Service: Orthopedics;  Laterality: Right;   Patient Active Problem List   Diagnosis Date Noted   Status post total replacement of right hip 07/26/2023   Hyperlipidemia 07/17/2023   Elevated coronary artery calcium  score 07/17/2023   Preoperative clearance 07/17/2023   Mitral regurgitation, moderate-possibly severe 07/11/2023   Vitamin D  deficiency 05/23/2023   Foot pain, left 06/02/2021   Ptosis of right eyelid 04/12/2020   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 12/29/2018   Osteopenia 01/10/2017   Carotid artery stenosis, asymptomatic, bilateral 12/21/2016   Prediabetes 12/21/2016   Hypothyroidism 03/20/2012   Varicose veins  of bilateral lower extremities with other complications 12/21/2011   Allergic rhinitis 12/21/2009   Cardiac murmur 12/21/2009  Progress Note Reporting Period 08/12/2023 to 10/04/2023  See note below for Objective Data and Assessment of Progress/Goals.       REFERRING PROVIDER: Norberto Bear, MD  REFERRING DIAG:  [phone] (ICD-10-CM) - Status post total replacement of right hip     S/p Rt THA Rationale for Evaluation and Treatment: Rehabilitation  THERAPY DIAG:  Pain in right hip  Difficulty in walking, not elsewhere classified  Chronic pain of left knee  ONSET DATE: DOS 07/26/23   SUBJECTIVE:  SUBJECTIVE STATEMENT:  The patient reports her knee has felt pretty good the last few days. She has anterior pain at times in her hip but it is not bad.   PERTINENT HISTORY:  none  PAIN:  Are you having pain? Yes: NPRS scale: 3 Pain location: Rt laeral hip Pain description: tight, tender Aggravating factors: walking Relieving factors: rest  PRECAUTIONS:  Anterior hip  RED FLAGS: None   WEIGHT BEARING RESTRICTIONS:  No  FALLS:  Has patient fallen in last 6 months? No  LIVING ENVIRONMENT: 4 steps to get into home, bedroom on main floor, has a second story.  OCCUPATION:  Next week to return to work   PLOF:  Independent  PATIENT GOALS:  Don my own sock and shoes. Preserve left knee to avoid TKA, play pickle ball   OBJECTIVE:  Note: Objective measures were completed at Evaluation unless otherwise noted.  PATIENT SURVEYS:  LEFS 28/80  COGNITIVE STATUS: Within functional limits for tasks assessed   SENSATION: WFL  EDEMA:  Yes: mild  POSTURE:  Lacking full hip extension bilaterally  GAIT: EVAL: antalgic lacking hip extension through stance phase/toe off   Body  Part #1 Hip  PALPATION: Eval- edema and warmth  LOWER EXTREMITY ROM:     Active  Right eval Left eval Rt Lt  Standing 5/19 Right  6/6  Left 6/6  Hip flexion    105   Hip extension -12 -6 -7/-2    Hip abduction       Hip adduction       Hip internal rotation       Hip external rotation    40   Knee flexion       Knee extension 0 -8 -5  -5   (Blank rows = not tested)     Gross strength   Left/ right  flexion 4/4 Hip abduction 4+/4  Knee extension 4+/4                                                                                                                           TREATMENT DATE:  6/12 Manual: Grade 2 and 3 PA and AP mobilizations to reduce pain and improve mobility of the left knee.  Significant improvement in flexion noted with manual therapy.  Trigger point release to IT band.  Reviewed self trigger point release to IT band.  Self patellar mobilization. More emphasis on flexion ROM today.   There-ex:  PROM into flexion  SLR 3x10  L only   SAQ 3x12 bilateral 2lbs bilateral 3x12   6/9 Manual: Grade 2 and 3 PA and AP mobilizations to reduce pain and improve mobility of the left knee.  Significant improvement in flexion noted with manual therapy.  Trigger point release to IT band.  Reviewed self trigger point release to IT band.  Self patellar mobilization.   There-ex:  SLR 3x10  L only   SAQ 3x12  2lb 4 lb and 5 lb with  progressive RPE to 5   LAQ 5 lb  3x12 RPE of 6  Nustep 5 min     There-act:  Step up  3x15 each leg 6 inch right only  2 inch left leg 3x10   Neuro re-ed  Step onto air-ex 2x12    6/6 Manual: Grade 2 and 3 PA and AP mobilizations to reduce pain and improve mobility of the left knee.  Significant improvement in flexion noted with manual therapy.  Trigger point release to IT band.  Reviewed self trigger point release to IT band.  Self patellar mobilization.   There-ex:  LAQ: Right 2 lb 3x15  Left 3x10 no weight    Neuro-re-ed:  Step onto air-ex 2x10 each leg  Heel/toe rock 3x12  Slow march 3x10  1/3 squat with min cuing for technique 3x10   6/4  Manual: Grade 2 and 3 PA and AP mobilizations to reduce pain and improve mobility of the left knee.  Significant improvement in flexion noted with manual therapy.  Trigger point release to IT band.  Reviewed self trigger point release to IT band.  Self patellar mobilization.  Mild pain noted with patellar mobilization. Patella taping.  Review of self taping.  Neuro-re-ed:  Bridge 3x10  SLR left only 3x10   There-act:  Step up 4 inch 2x10 each leg 6 inch right only  Step down. Tried 4 inch but moved to 3 inch  Side step 4 inch with right 2x10      5/29 Manual: Grade 2 and 3 PA and AP mobilizations to reduce pain and improve mobility of the left knee.  Significant improvement in flexion noted with manual therapy.  Trigger point release to IT band.  Reviewed self trigger point release to IT band.  Self patellar mobilization.  Mild pain noted with patellar mobilization. Patella taping.  Review of self taping.  There ex Quad set 3 x 10 SLR 3x10  SAQ 1 x 10 RPE 3 2 x 10 1.5 pounds RPE of 5 mild pain reviewed pedigree from home  Neuromuscular reeducation Red band 3 x 10 bilateral   Treatment                            5/19: Blank lines following charge title = not provided on this treatment date.   Manual:  TPDN No Ktape LCL There-ex: Seated mini SLR from hamstring stretch Iso hamstring stretch Lateral step up 4 Squats at bar- mod single leg and double leg There-Act: Sit<>stand and progress to walking with upright posture.  Self Care:  Nuro-Re-ed:  Gait Training:      PATIENT EDUCATION:  Education details: Teacher, music of condition, POC, HEP, exercise form/rationale Person educated: Patient Education method: Explanation, Demonstration, Tactile cues, Verbal cues, and Handouts Education comprehension: verbalized understanding,  returned demonstration, verbal cues required, tactile cues required, and needs further education  HOME EXERCISE PROGRAM: Access Code: [email or web address] URL: [email or web address]  ASSESSMENT:  CLINICAL IMPRESSION: The patient feels like her left knee flexion ROM is limiting her. We worked on it today. Per visual inspection she had an improvement in ROM. She continues to have significant limitations in left knee motion. We continue to progress the functional strength of her right hip. She is doing very well with this. She will be seeing and MD soon about her left knee.   REHAB POTENTIAL: Good  CLINICAL DECISION MAKING: Stable/uncomplicated  EVALUATION COMPLEXITY: Low   GOALS: Goals reviewed with patient? Yes  SHORT TERM GOALS: Target date: 08/31/23  Sit<>stand without use of UEs Baseline: Goal status: improving but still having difficulty bending the knee. 5/16  2.  Standing hip hip to neutral extension Baseline:  Goal status: achieved 6/6    LONG TERM GOALS: Target date: POC date  Stand as lon as necessary for work with minimal discomfort Baseline:  Goal status: improving   2.  LEFS to improve by MDC x2 Baseline:  Goal status: progressing 6/8  3.  Hip abd strength to 80% of opposite LE Baseline:  Goal status: improving 6/6 4.  Easily don socks and shoes independently Baseline:  Goal status: INITIAL  5.  Begin training for return to pickle ball Baseline:  Goal status: has not returned 6/6     PLAN:  PT FREQUENCY: 1-2x/week  PT DURATION:8 weeks  PLANNED INTERVENTIONS: 97164- PT Re-evaluation, 97750- Physical Performance Testing, 97110-Therapeutic exercises, 97530- Therapeutic activity, 97112- Neuromuscular re-education, 97535- Self Care, 16109- Manual therapy, [phone]- Gait training, [phone]- Aquatic Therapy, Patient/Family education, Balance training, Stair training, Taping, Dry Needling, Joint mobilization, Spinal mobilization, Scar mobilization,  Cryotherapy, and Moist heat.  PLAN FOR NEXT SESSION: continue flexibility and strength to reduce post pelvic tilt.  Signa Drier PT DPT 10/09/23 4:48 PM   Referring diagnosis?  B14.782 (ICD-10-CM) - Status post total replacement of right hip    Treatment diagnosis? (if different than referring diagnosis) M25.551 What was this (referring dx) caused by? [x]  Surgery []  Fall []  Ongoing issue []  Arthritis []  Other: ____________  Laterality: [x]  Rt []  Lt []  Both  Check all possible CPT codes:  *CHOOSE 10 OR LESS*    See Planned Interventions listed in the Plan section of the Evaluation.

## 2023-10-10 ENCOUNTER — Encounter (HOSPITAL_BASED_OUTPATIENT_CLINIC_OR_DEPARTMENT_OTHER): Payer: Self-pay | Admitting: Physical Therapy

## 2023-10-12 ENCOUNTER — Encounter (HOSPITAL_BASED_OUTPATIENT_CLINIC_OR_DEPARTMENT_OTHER): Payer: Self-pay

## 2023-10-12 ENCOUNTER — Emergency Department (HOSPITAL_BASED_OUTPATIENT_CLINIC_OR_DEPARTMENT_OTHER): Admitting: Radiology

## 2023-10-12 ENCOUNTER — Other Ambulatory Visit: Payer: Self-pay

## 2023-10-12 ENCOUNTER — Emergency Department (HOSPITAL_BASED_OUTPATIENT_CLINIC_OR_DEPARTMENT_OTHER)
Admission: EM | Admit: 2023-10-12 | Discharge: 2023-10-12 | Disposition: A | Discharge status: Home / Self Care | Attending: Emergency Medicine | Admitting: Emergency Medicine

## 2023-10-12 ENCOUNTER — Emergency Department (HOSPITAL_BASED_OUTPATIENT_CLINIC_OR_DEPARTMENT_OTHER)

## 2023-10-12 DIAGNOSIS — M25562 Pain in left knee: Secondary | ICD-10-CM | POA: Insufficient documentation

## 2023-10-12 DIAGNOSIS — Z79899 Other long term (current) drug therapy: Secondary | ICD-10-CM | POA: Diagnosis not present

## 2023-10-12 DIAGNOSIS — Z7982 Long term (current) use of aspirin: Secondary | ICD-10-CM | POA: Insufficient documentation

## 2023-10-12 DIAGNOSIS — R600 Localized edema: Secondary | ICD-10-CM | POA: Diagnosis not present

## 2023-10-12 DIAGNOSIS — Z96641 Presence of right artificial hip joint: Secondary | ICD-10-CM | POA: Diagnosis not present

## 2023-10-12 DIAGNOSIS — M1712 Unilateral primary osteoarthritis, left knee: Secondary | ICD-10-CM | POA: Diagnosis not present

## 2023-10-12 DIAGNOSIS — I10 Essential (primary) hypertension: Secondary | ICD-10-CM | POA: Insufficient documentation

## 2023-10-12 DIAGNOSIS — M25462 Effusion, left knee: Secondary | ICD-10-CM | POA: Diagnosis not present

## 2023-10-12 LAB — CBC WITH DIFFERENTIAL/PLATELET
Abs Immature Granulocytes: 0.02 10*3/uL (ref 0.00–0.07)
Basophils Absolute: 0 10*3/uL (ref 0.0–0.1)
Basophils Relative: 1 %
Eosinophils Absolute: 0.3 10*3/uL (ref 0.0–0.5)
Eosinophils Relative: 4 %
HCT: 33.3 % — ABNORMAL LOW (ref 36.0–46.0)
Hemoglobin: 10.9 g/dL — ABNORMAL LOW (ref 12.0–15.0)
Immature Granulocytes: 0 %
Lymphocytes Relative: 28 %
Lymphs Abs: 1.9 10*3/uL (ref 0.7–4.0)
MCH: 28.8 pg (ref 26.0–34.0)
MCHC: 32.7 g/dL (ref 30.0–36.0)
MCV: 88.1 fL (ref 80.0–100.0)
Monocytes Absolute: 0.6 10*3/uL (ref 0.1–1.0)
Monocytes Relative: 9 %
Neutro Abs: 4.1 10*3/uL (ref 1.7–7.7)
Neutrophils Relative %: 58 %
Platelets: 305 10*3/uL (ref 150–400)
RBC: 3.78 MIL/uL — ABNORMAL LOW (ref 3.87–5.11)
RDW: 14.3 % (ref 11.5–15.5)
WBC: 7 10*3/uL (ref 4.0–10.5)
nRBC: 0 % (ref 0.0–0.2)

## 2023-10-12 LAB — COMPREHENSIVE METABOLIC PANEL WITH GFR
ALT: 12 U/L (ref 0–44)
AST: 16 U/L (ref 15–41)
Albumin: 4.1 g/dL (ref 3.5–5.0)
Alkaline Phosphatase: 93 U/L (ref 38–126)
Anion gap: 13 (ref 5–15)
BUN: 22 mg/dL (ref 8–23)
CO2: 24 mmol/L (ref 22–32)
Calcium: 9.9 mg/dL (ref 8.9–10.3)
Chloride: 104 mmol/L (ref 98–111)
Creatinine, Ser: 0.61 mg/dL (ref 0.44–1.00)
GFR, Estimated: >60 mL/min (ref >60)
Glucose, Bld: 148 mg/dL — ABNORMAL HIGH (ref 70–99)
Potassium: 3.8 mmol/L (ref 3.5–5.1)
Sodium: 141 mmol/L (ref 135–145)
Total Bilirubin: 0.2 mg/dL (ref 0.0–1.2)
Total Protein: 7.3 g/dL (ref 6.5–8.1)

## 2023-10-12 NOTE — Discharge Instructions (Addendum)
 You were seen for your leg pain in the emergency department.   At home, please take tylenol  and ice your knee.   Check your MyChart online for the results of any tests that had not resulted by the time you left the emergency department.   Follow-up with your primary doctor in 2-3 days regarding your visit and your blood pressure. Follow-up with your orthopedist about your knee pain.    Return immediately to the emergency department if you experience any of the following: worsening pain, or any other concerning symptoms.    Thank you for visiting our Emergency Department. It was a pleasure taking care of you today.

## 2023-10-12 NOTE — ED Triage Notes (Signed)
 Pt c/o L knee pain/ swelling onset yesterday- PT Wednesday was great, but it's been swollen for awhile.  C/o L calf pain a lot, like in the outer band area Ice, doing nothing/ not moving makes pain better, movement/ weight makes it worse.

## 2023-10-12 NOTE — ED Provider Notes (Signed)
 [facility] EMERGENCY DEPARTMENT AT [facility] Provider Note   CSN: [id number] Arrival date & time: 10/12/23  1624     Patient presents with: Knee Pain   Jaime Rose is a 73 y.o. female.   73 year old female with history of recent right hip replacement left knee osteoarthritis who presents emergency department for left knee swelling.  Patient reports that in late March she had total hip replacement of the right hip.  Says that recently she started experiencing some left knee pain and swelling ago who left leg from her thigh but also into her calf.  Also has had some calf soreness as well.  Was at a party with other medical personnel today and they were worried about a blood clot so told her to come to the emergency department.  Denies any chest pain or shortness of breath       Prior to Admission medications   Medication Sig Start Date End Date Taking? Authorizing Provider  mupirocin  ointment (BACTROBAN ) 2 % Apply 1 Application topically daily. Applied to the hip wound daily after showering 08/19/23   Arnie Lao, MD  acetaminophen  (TYLENOL ) 650 MG CR tablet Take 1,300 mg by mouth 2 (two) times daily.    [provider]  aspirin  81 MG chewable tablet Chew 1 tablet (81 mg total) by mouth 2 (two) times daily. 07/27/23   Arnie Lao, MD  Calcium -Vitamin D -Vitamin K (VIACTIV CALCIUM  PLUS D) 650-12.5-40 MG-MCG-MCG CHEW Chew 1 Piece by mouth daily.    [provider]  cholecalciferol  (VITAMIN D3) 25 MCG (1000 UNIT) tablet Take 1,000 Units by mouth daily.    [provider]  levothyroxine  (SYNTHROID ) 112 MCG tablet TAKE 1 TABLET BY MOUTH ONCE DAILY BEFORE BREAKFAST FOR 6 DAYS PER WEEK, THEN TAKE 2 TABLETS BY MOUTH ONCE DAILY BEFORE BREAKFAST FOR 1 DAY PER WEEK 10/08/23   Burns, Beckey Bourgeois, MD  loratadine  (CLARITIN ) 10 MG tablet Take 10 mg by mouth daily.    [provider]  methocarbamol  (ROBAXIN ) 500 MG tablet Take 1 tablet  (500 mg total) by mouth every 6 (six) hours as needed for muscle spasms. 07/27/23   Arnie Lao, MD  oxyCODONE  (OXY IR/ROXICODONE ) 5 MG immediate release tablet Take 1-2 tablets (5-10 mg total) by mouth every 6 (six) hours as needed for moderate pain (pain score 4-6) (pain score 4-6). 07/27/23   Arnie Lao, MD  RESTASIS  0.05 % ophthalmic emulsion Place 1 drop into both eyes 2 (two) times daily. 05/12/21   [provider]    Allergies: Prednisone, Orange fruit [citrus], and Clobetasol  propionate    Review of Systems  Updated Vital Signs BP (!) 167/70   Pulse 77   Temp 97.9 F (36.6 C) (Oral)   Resp 18   SpO2 100%   Physical Exam Vitals and nursing note reviewed.  Constitutional:      General: She is not in acute distress.    Appearance: She is well-developed.  HENT:     Head: Normocephalic and atraumatic.     Right Ear: External ear normal.     Left Ear: External ear normal.     Nose: Nose normal.   Eyes:     Extraocular Movements: Extraocular movements intact.     Conjunctiva/sclera: Conjunctivae normal.     Pupils: Pupils are equal, round, and reactive to light.    Cardiovascular:     Comments: DP pulses 2+ in the left foot Pulmonary:     Effort:  Pulmonary effort is normal. No respiratory distress.   Musculoskeletal:     Cervical back: Normal range of motion and neck supple.     Right lower leg: No edema.     Left lower leg: Edema (1+) present.     Comments: Left knee with small effusion.  Full range of motion of the left knee.  No erythema or warmth of the left knee.   Skin:    General: Skin is warm and dry.   Neurological:     Mental Status: She is alert and oriented to person, place, and time. Mental status is at baseline.   Psychiatric:        Mood and Affect: Mood normal.     (all labs ordered are listed, but only abnormal results are displayed) Labs Reviewed  CBC WITH DIFFERENTIAL/PLATELET - Abnormal; Notable for the  following components:      Result Value   RBC 3.78 (*)    Hemoglobin 10.9 (*)    HCT 33.3 (*)    All other components within normal limits  COMPREHENSIVE METABOLIC PANEL WITH GFR - Abnormal; Notable for the following components:   Glucose, Bld 148 (*)    All other components within normal limits    EKG: None  Radiology: US  Venous Img Lower Unilateral Left Result Date: 10/12/2023 CLINICAL DATA:  Left lower extremity edema EXAM: LEFT LOWER EXTREMITY VENOUS DOPPLER ULTRASOUND TECHNIQUE: Gray-scale sonography with compression, as well as color and duplex ultrasound, were performed to evaluate the deep venous system(s) from the level of the common femoral vein through the popliteal and proximal calf veins. COMPARISON:  None Available. FINDINGS: VENOUS Normal compressibility of the common femoral, superficial femoral, and popliteal veins, as well as the visualized calf veins. Visualized portions of profunda femoral vein and great saphenous vein unremarkable. No filling defects to suggest DVT on grayscale or color Doppler imaging. Doppler waveforms show normal direction of venous flow, normal respiratory plasticity and response to augmentation. Limited views of the contralateral common femoral vein are unremarkable. OTHER None. Limitations: none IMPRESSION: Negative. Electronically Signed   By: Fernando Hoyer M.D.   On: 10/12/2023 19:12   DG Knee 2 Views Left Result Date: 10/12/2023 CLINICAL DATA:  Left knee pain and swelling. EXAM: LEFT KNEE - 1-2 VIEW COMPARISON:  Left knee radiographs 12/28/2013 FINDINGS: Minimal peripheral lateral compartment degenerative spurring is similar to prior. There is even more mild minimal peripheral medial compartment degenerative spurring that appears new from prior. Minimal chronic enthesopathic change at the quadriceps insertion on the patella. New mild-to-moderate joint effusion. No acute fracture is seen. No dislocation. IMPRESSION: 1. New mild-to-moderate joint  effusion. 2. Minimal medial and lateral compartment peripheral degenerative spurring. Electronically Signed   By: Bertina Broccoli M.D.   On: 10/12/2023 18:39     Procedures   Medications Ordered in the ED - No data to display                                  Medical Decision Making Amount and/or Complexity of Data Reviewed Labs: ordered. Radiology: ordered.   73 year old female with a history of recent right hip replacement and left knee osteoarthritis who presents emergency department with left knee and left leg pain and swelling  Initial Ddx:  Osteoarthritis, septic joint, gout, DVT, lymphedema, venous stasis  MDM/Course:  Patient presents emergency department with left knee pain and swelling.  Also reports pain and swelling  of the remainder of her leg as well.  Does appear somewhat edematous on exam.  Did have surgery but it was several months ago.  No other major risk factors for DVT.  Ultrasound was obtained that does not show blood clot.  X-ray obtained that does show a joint effusion but on exam she does not have any signs of septic joint currently.  Also do not feel that it is consistent with gout.  I suspect that her symptoms are likely from worsening osteoarthritis and swelling of her leg that has resulted from her knee effusion.  Patient does have elevated blood pressure as well.  Patient has declined antihypertensives at this time.  Will have her follow-up with her primary doctor about this.  This patient presents to the ED for concern of complaints listed in HPI, this involves an extensive number of treatment options, and is a complaint that carries with it a high risk of complications and morbidity. Disposition including potential need for admission considered.   Dispo: DC Home. Return precautions discussed including, but not limited to, those listed in the AVS. Allowed pt time to ask questions which were answered fully prior to dc.  Additional history obtained from  family Records reviewed Outpatient Clinic Notes The following labs were independently interpreted: Chemistry and show no acute abnormality I independently reviewed the following imaging with scope of interpretation limited to determining acute life threatening conditions related to emergency care: Extremity x-ray(s) and agree with the radiologist interpretation with the following exceptions: none I personally reviewed and interpreted cardiac monitoring: normal sinus rhythm  I have reviewed the patients home medications and made adjustments as needed Social Determinants of health:  Geriatric  Portions of this note were generated with Scientist, clinical (histocompatibility and immunogenetics). Dictation errors may occur despite best attempts at proofreading.     Final diagnoses:  Acute pain of left knee  Uncontrolled hypertension    ED Discharge Orders     None          Ninetta Basket, MD 10/12/23 2033

## 2023-10-12 NOTE — ED Notes (Signed)
 Ultrasound at bedside

## 2023-10-12 NOTE — ED Notes (Signed)
 ED Provider at bedside.

## 2023-10-12 NOTE — ED Notes (Signed)
 Reviewed discharge instructions and follow up care with pt. Pt verbalized understanding and had no further questions. Pt exited ED without complications.

## 2023-10-17 ENCOUNTER — Encounter (HOSPITAL_BASED_OUTPATIENT_CLINIC_OR_DEPARTMENT_OTHER): Payer: Self-pay | Admitting: Physical Therapy

## 2023-10-17 ENCOUNTER — Ambulatory Visit (HOSPITAL_BASED_OUTPATIENT_CLINIC_OR_DEPARTMENT_OTHER): Admitting: Physical Therapy

## 2023-10-17 DIAGNOSIS — G8929 Other chronic pain: Secondary | ICD-10-CM | POA: Diagnosis not present

## 2023-10-17 DIAGNOSIS — M6281 Muscle weakness (generalized): Secondary | ICD-10-CM

## 2023-10-17 DIAGNOSIS — R262 Difficulty in walking, not elsewhere classified: Secondary | ICD-10-CM

## 2023-10-17 DIAGNOSIS — M25662 Stiffness of left knee, not elsewhere classified: Secondary | ICD-10-CM | POA: Diagnosis not present

## 2023-10-17 DIAGNOSIS — R2689 Other abnormalities of gait and mobility: Secondary | ICD-10-CM | POA: Diagnosis not present

## 2023-10-17 DIAGNOSIS — M25551 Pain in right hip: Secondary | ICD-10-CM

## 2023-10-17 DIAGNOSIS — M25562 Pain in left knee: Secondary | ICD-10-CM | POA: Diagnosis not present

## 2023-10-18 ENCOUNTER — Encounter (HOSPITAL_BASED_OUTPATIENT_CLINIC_OR_DEPARTMENT_OTHER): Payer: Self-pay | Admitting: Physical Therapy

## 2023-10-18 NOTE — Therapy (Signed)
 OUTPATIENT PHYSICAL THERAPY / Progress note    Patient Name: Jaime Rose MRN: [id number] DOB:10-Oct-1950, 73 y.o., female Today's Date: 10/18/2023  END OF SESSION:  PT End of Session - 10/17/23 0941     Visit Number 13    Number of Visits 26    Date for PT Re-Evaluation 12/01/23    Authorization Type 22 approved from 10/14/2023    PT Start Time 0933    PT Stop Time 1013    PT Time Calculation (min) 40 min    Activity Tolerance Patient tolerated treatment well    Behavior During Therapy [facility] for tasks assessed/performed              Past Medical History:  Diagnosis Date   ALLERGIC RHINITIS    Allergy 1985   seasonal allergies   Arthritis    CHICKENPOX, HX OF 12/21/2009   Qualifier: Diagnosis of  By: Myrtice Athens MD, Rufina Cough    Heart murmur    MR   Hyperthyroidism    normal TFTs 11/2009 , dx 10/2011   Pre-diabetes    Varicose vein    s/p sclerosis tx summer 2013   Past Surgical History:  Procedure Laterality Date   ENDOVENOUS ABLATION SAPHENOUS VEIN W/ LASER  04/11/2012   endovenous laser ablation right greater saphenous vein and stab phlebectomy  right leg  by Ouida Bloom MD   NO PAST SURGERIES     TOTAL HIP ARTHROPLASTY Right 07/26/2023   Procedure: ARTHROPLASTY, HIP, TOTAL, ANTERIOR APPROACH;  Surgeon: Arnie Lao, MD;  Location: [facility];  Service: Orthopedics;  Laterality: Right;   Patient Active Problem List   Diagnosis Date Noted   Status post total replacement of right hip 07/26/2023   Hyperlipidemia 07/17/2023   Elevated coronary artery calcium  score 07/17/2023   Preoperative clearance 07/17/2023   Mitral regurgitation, moderate-possibly severe 07/11/2023   Vitamin D  deficiency 05/23/2023   Foot pain, left 06/02/2021   Ptosis of right eyelid 04/12/2020   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 12/29/2018   Osteopenia 01/10/2017   Carotid artery stenosis, asymptomatic, bilateral 12/21/2016   Prediabetes 12/21/2016   Hypothyroidism 03/20/2012    Varicose veins of bilateral lower extremities with other complications 12/21/2011   Allergic rhinitis 12/21/2009   Cardiac murmur 12/21/2009  Progress Note Reporting Period 08/12/2023 to 10/04/2023  See note below for Objective Data and Assessment of Progress/Goals.       REFERRING PROVIDER: Norberto Bear, MD  REFERRING DIAG:  [phone] (ICD-10-CM) - Status post total replacement of right hip     S/p Rt THA Rationale for Evaluation and Treatment: Rehabilitation  THERAPY DIAG:  Pain in right hip  Difficulty in walking, not elsewhere classified  Chronic pain of left knee  Stiffness of left knee, not elsewhere classified  Muscle weakness (generalized)  Other abnormalities of gait and mobility  ONSET DATE: DOS 07/26/23   SUBJECTIVE:  SUBJECTIVE STATEMENT:  The patient reports her knee has felt pretty good the last few days. She has anterior pain at times in her hip but it is not bad.   PERTINENT HISTORY:  none  PAIN:  Are you having pain? Yes: NPRS scale: 3 Pain location: Rt laeral hip Pain description: tight, tender Aggravating factors: walking Relieving factors: rest  PRECAUTIONS:  Anterior hip  RED FLAGS: None   WEIGHT BEARING RESTRICTIONS:  No  FALLS:  Has patient fallen in last 6 months? No  LIVING ENVIRONMENT: 4 steps to get into home, bedroom on main floor, has a second story.  OCCUPATION:  Next week to return to work   PLOF:  Independent  PATIENT GOALS:  Don my own sock and shoes. Preserve left knee to avoid TKA, play pickle ball   OBJECTIVE:  Note: Objective measures were completed at Evaluation unless otherwise noted.  PATIENT SURVEYS:  LEFS 28/80  COGNITIVE STATUS: Within functional limits for tasks assessed   SENSATION: WFL  EDEMA:  Yes:  mild  POSTURE:  Lacking full hip extension bilaterally  GAIT: EVAL: antalgic lacking hip extension through stance phase/toe off   Body Part #1 Hip  PALPATION: Eval- edema and warmth  LOWER EXTREMITY ROM:     Active  Right eval Left eval Rt Lt  Standing 5/19 Right  6/6  Left 6/6  Hip flexion    105   Hip extension -12 -6 -7/-2    Hip abduction       Hip adduction       Hip internal rotation       Hip external rotation    40   Knee flexion       Knee extension 0 -8 -5  -5   (Blank rows = not tested)     Gross strength   Left/ right  flexion 4/4 Hip abduction 4+/4  Knee extension 4+/4                                                                                                                           TREATMENT DATE:  6/20 Manual: Grade 2 and 3 PA and AP mobilizations to reduce pain and improve mobility of the left knee.  Significant improvement in flexion noted with manual therapy.  Trigger point release to IT band.  Reviewed self trigger point release to IT band.  Self patellar mobilization. More emphasis on flexion ROM today.   There-ex:  PROM into flexion total arc 6-94  SAQ 3x12 bilateral 2 lbs right no weight left   LAQ right only 5 lbs 3x12   There-act:  Mini squats 3x12  Step up 6 inch right  4 inch left 3x10 each    6/12 Manual: Grade 2 and 3 PA and AP mobilizations to reduce pain and improve mobility of the left knee.  Significant improvement in flexion noted with manual therapy.  Trigger point release to IT band.  Reviewed self trigger point release to  IT band.  Self patellar mobilization. More emphasis on flexion ROM today.   There-ex:  PROM into flexion  SLR 3x10  L only   SAQ 3x12 bilateral 2lbs bilateral 3x12   LAQ right only 5 lbs 3x12   There-act:  Mini squats 3x12  Step up 6 inch right  4 inch left 3x10 each   6/9 Manual: Grade 2 and 3 PA and AP mobilizations to reduce pain and improve mobility of the left knee.   Significant improvement in flexion noted with manual therapy.  Trigger point release to IT band.  Reviewed self trigger point release to IT band.  Self patellar mobilization.   There-ex:  SLR 3x10  L only   SAQ 3x12  2lb 4 lb and 5 lb with progressive RPE to 5   LAQ 5 lb  3x12 RPE of 6  Nustep 5 min     There-act:  Step up  3x15 each leg 6 inch right only  2 inch left leg 3x10   Neuro re-ed  Step onto air-ex 2x12    6/6 Manual: Grade 2 and 3 PA and AP mobilizations to reduce pain and improve mobility of the left knee.  Significant improvement in flexion noted with manual therapy.  Trigger point release to IT band.  Reviewed self trigger point release to IT band.  Self patellar mobilization.   There-ex:  LAQ: Right 2 lb 3x15  Left 3x10 no weight   Neuro-re-ed:  Step onto air-ex 2x10 each leg  Heel/toe rock 3x12  Slow march 3x10  1/3 squat with min cuing for technique 3x10   6/4  Manual: Grade 2 and 3 PA and AP mobilizations to reduce pain and improve mobility of the left knee.  Significant improvement in flexion noted with manual therapy.  Trigger point release to IT band.  Reviewed self trigger point release to IT band.  Self patellar mobilization.  Mild pain noted with patellar mobilization. Patella taping.  Review of self taping.  Neuro-re-ed:  Bridge 3x10  SLR left only 3x10   There-act:  Step up 4 inch 2x10 each leg 6 inch right only  Step down. Tried 4 inch but moved to 3 inch  Side step 4 inch with right 2x10      5/29 Manual: Grade 2 and 3 PA and AP mobilizations to reduce pain and improve mobility of the left knee.  Significant improvement in flexion noted with manual therapy.  Trigger point release to IT band.  Reviewed self trigger point release to IT band.  Self patellar mobilization.  Mild pain noted with patellar mobilization. Patella taping.  Review of self taping.  There ex Quad set 3 x 10 SLR 3x10  SAQ 1 x 10 RPE 3 2 x 10 1.5 pounds RPE of 5  mild pain reviewed pedigree from home  Neuromuscular reeducation Red band 3 x 10 bilateral   Treatment                            5/19: Blank lines following charge title = not provided on this treatment date.   Manual:  TPDN No Ktape LCL There-ex: Seated mini SLR from hamstring stretch Iso hamstring stretch Lateral step up 4 Squats at bar- mod single leg and double leg There-Act: Sit<>stand and progress to walking with upright posture.  Self Care:  Nuro-Re-ed:  Gait Training:      PATIENT EDUCATION:  Education details: Anatomy of condition, POC, HEP,  exercise form/rationale Person educated: Patient Education method: Explanation, Demonstration, Tactile cues, Verbal cues, and Handouts Education comprehension: verbalized understanding, returned demonstration, verbal cues required, tactile cues required, and needs further education  HOME EXERCISE PROGRAM: Access Code: [email or web address] URL: [email or web address]  ASSESSMENT:  CLINICAL IMPRESSION: The patients knee flexion and extension are improving. She is having less pain in the knee which is helping her right hip as well. Her right hip is coming along very well. We continue to advance her functional activity. We will continue to progress as tolerated.   REHAB POTENTIAL: Good  CLINICAL DECISION MAKING: Stable/uncomplicated  EVALUATION COMPLEXITY: Low   GOALS: Goals reviewed with patient? Yes  SHORT TERM GOALS: Target date: 08/31/23  Sit<>stand without use of UEs Baseline: Goal status: improving but still having difficulty bending the knee. 5/16  2.  Standing hip hip to neutral extension Baseline:  Goal status: achieved 6/6    LONG TERM GOALS: Target date: POC date  Stand as lon as necessary for work with minimal discomfort Baseline:  Goal status: improving   2.  LEFS to improve by MDC x2 Baseline:  Goal status: progressing 6/8  3.  Hip abd strength to 80% of opposite LE Baseline:  Goal  status: improving 6/6 4.  Easily don socks and shoes independently Baseline:  Goal status: INITIAL  5.  Begin training for return to pickle ball Baseline:  Goal status: has not returned 6/6     PLAN:  PT FREQUENCY: 1-2x/week  PT DURATION:8 weeks  PLANNED INTERVENTIONS: 97164- PT Re-evaluation, 97750- Physical Performance Testing, 97110-Therapeutic exercises, 97530- Therapeutic activity, 97112- Neuromuscular re-education, 97535- Self Care, 16109- Manual therapy, [phone]- Gait training, [phone]- Aquatic Therapy, Patient/Family education, Balance training, Stair training, Taping, Dry Needling, Joint mobilization, Spinal mobilization, Scar mobilization, Cryotherapy, and Moist heat.  PLAN FOR NEXT SESSION: continue flexibility and strength to reduce post pelvic tilt.  Signa Drier PT DPT 10/18/23 8:18 AM   Referring diagnosis?  B14.782 (ICD-10-CM) - Status post total replacement of right hip    Treatment diagnosis? (if different than referring diagnosis) M25.551 What was this (referring dx) caused by? [x]  Surgery []  Fall []  Ongoing issue []  Arthritis []  Other: ____________  Laterality: [x]  Rt []  Lt []  Both  Check all possible CPT codes:  *CHOOSE 10 OR LESS*    See Planned Interventions listed in the Plan section of the Evaluation.

## 2023-10-22 ENCOUNTER — Ambulatory Visit (HOSPITAL_BASED_OUTPATIENT_CLINIC_OR_DEPARTMENT_OTHER): Admitting: Physical Therapy

## 2023-10-22 ENCOUNTER — Encounter (HOSPITAL_BASED_OUTPATIENT_CLINIC_OR_DEPARTMENT_OTHER): Payer: Self-pay | Admitting: Physical Therapy

## 2023-10-22 DIAGNOSIS — M25551 Pain in right hip: Secondary | ICD-10-CM

## 2023-10-22 DIAGNOSIS — R2689 Other abnormalities of gait and mobility: Secondary | ICD-10-CM | POA: Diagnosis not present

## 2023-10-22 DIAGNOSIS — G8929 Other chronic pain: Secondary | ICD-10-CM

## 2023-10-22 DIAGNOSIS — R262 Difficulty in walking, not elsewhere classified: Secondary | ICD-10-CM | POA: Diagnosis not present

## 2023-10-22 DIAGNOSIS — M6281 Muscle weakness (generalized): Secondary | ICD-10-CM

## 2023-10-22 DIAGNOSIS — M25562 Pain in left knee: Secondary | ICD-10-CM | POA: Diagnosis not present

## 2023-10-22 DIAGNOSIS — M25662 Stiffness of left knee, not elsewhere classified: Secondary | ICD-10-CM | POA: Diagnosis not present

## 2023-10-22 NOTE — Therapy (Signed)
 OUTPATIENT PHYSICAL THERAPY / Progress note    Patient Name: Jaime Rose MRN: [id number] DOB:Dec 08, 1950, 73 y.o., female Today's Date: 10/23/2023  END OF SESSION:  PT End of Session - 10/22/23 1452     Visit Number 14    Number of Visits 26    Date for PT Re-Evaluation 12/01/23    Authorization Type 22 approved from 10/14/2023    PT Start Time 1430    PT Stop Time 1513    PT Time Calculation (min) 43 min    Activity Tolerance Patient tolerated treatment well    Behavior During Therapy [facility] for tasks assessed/performed               Past Medical History:  Diagnosis Date   ALLERGIC RHINITIS    Allergy 1985   seasonal allergies   Arthritis    CHICKENPOX, HX OF 12/21/2009   Qualifier: Diagnosis of  By: Inocencio MD, Berwyn DELENA    Heart murmur    MR   Hyperthyroidism    normal TFTs 11/2009 , dx 10/2011   Pre-diabetes    Varicose vein    s/p sclerosis tx summer 2013   Past Surgical History:  Procedure Laterality Date   ENDOVENOUS ABLATION SAPHENOUS VEIN W/ LASER  04/11/2012   endovenous laser ablation right greater saphenous vein and stab phlebectomy  right leg  by Krystal Doing MD   NO PAST SURGERIES     TOTAL HIP ARTHROPLASTY Right 07/26/2023   Procedure: ARTHROPLASTY, HIP, TOTAL, ANTERIOR APPROACH;  Surgeon: Vernetta Lonni GRADE, MD;  Location: [facility];  Service: Orthopedics;  Laterality: Right;   Patient Active Problem List   Diagnosis Date Noted   Status post total replacement of right hip 07/26/2023   Hyperlipidemia 07/17/2023   Elevated coronary artery calcium  score 07/17/2023   Preoperative clearance 07/17/2023   Mitral regurgitation, moderate-possibly severe 07/11/2023   Vitamin D  deficiency 05/23/2023   Foot pain, left 06/02/2021   Ptosis of right eyelid 04/12/2020   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 12/29/2018   Osteopenia 01/10/2017   Carotid artery stenosis, asymptomatic, bilateral 12/21/2016   Prediabetes 12/21/2016   Hypothyroidism 03/20/2012    Varicose veins of bilateral lower extremities with other complications 12/21/2011   Allergic rhinitis 12/21/2009   Cardiac murmur 12/21/2009  Progress Note Reporting Period 08/12/2023 to 10/04/2023  See note below for Objective Data and Assessment of Progress/Goals.       REFERRING PROVIDER: Lonni Vernetta, MD  REFERRING DIAG:  [phone] (ICD-10-CM) - Status post total replacement of right hip     S/p Rt THA Rationale for Evaluation and Treatment: Rehabilitation  THERAPY DIAG:  Pain in right hip  Difficulty in walking, not elsewhere classified  Chronic pain of left knee  Stiffness of left knee, not elsewhere classified  Muscle weakness (generalized)  Other abnormalities of gait and mobility  ONSET DATE: DOS 07/26/23   SUBJECTIVE:  SUBJECTIVE STATEMENT: The patient is sore. She has had a 4 hour camp in which she was on her feet all day.   [name]   PERTINENT HISTORY:  none  PAIN:  Are you having pain? Yes: NPRS scale: 3 Pain location: Rt laeral hip Pain description: tight, tender Aggravating factors: walking Relieving factors: rest  PRECAUTIONS:  Anterior hip  RED FLAGS: None   WEIGHT BEARING RESTRICTIONS:  No  FALLS:  Has patient fallen in last 6 months? No  LIVING ENVIRONMENT: 4 steps to get into home, bedroom on main floor, has a second story.  OCCUPATION:  Next week to return to work   PLOF:  Independent  PATIENT GOALS:  Don my own sock and shoes. Preserve left knee to avoid TKA, play pickle ball   OBJECTIVE:  Note: Objective measures were completed at Evaluation unless otherwise noted.  PATIENT SURVEYS:  LEFS 28/80  COGNITIVE STATUS: Within functional limits for tasks assessed   SENSATION: WFL  EDEMA:  Yes: mild  POSTURE:  Lacking full hip  extension bilaterally  GAIT: EVAL: antalgic lacking hip extension through stance phase/toe off   Body Part #1 Hip  PALPATION: Eval- edema and warmth  LOWER EXTREMITY ROM:     Active  Right eval Left eval Rt Lt  Standing 5/19 Right  6/6  Left 6/6  Hip flexion    105   Hip extension -12 -6 -7/-2    Hip abduction       Hip adduction       Hip internal rotation       Hip external rotation    40   Knee flexion       Knee extension 0 -8 -5  -5   (Blank rows = not tested)     Gross strength   Left/ right  flexion 4/4 Hip abduction 4+/4  Knee extension 4+/4                                                                                                                           TREATMENT DATE:  6/24 Manual: Grade 2 and 3 PA and AP mobilizations to reduce pain and improve mobility of the left knee.  Significant improvement in flexion noted with manual therapy.  Trigger point release to IT band.  Reviewed self trigger point release to IT band.  Self patellar mobilization. More emphasis on flexion ROM today.   There-ex:  PROM into flexion total arc 6-93  SAQ 3x12 2.5 lbs right no weight left  Quad set 3x15  SLR 3x12 right only   There-act:  Step up 2x10 right 4 inch  Side step up 2x10 4 inch right        6/20 Manual: Grade 2 and 3 PA and AP mobilizations to reduce pain and improve mobility of the left knee.  Significant improvement in flexion noted with manual therapy.  Trigger point release to IT band.  Reviewed self trigger point release to IT band.  Self patellar mobilization. More emphasis on flexion ROM today.   There-ex:  PROM into flexion total arc 6-94  SAQ 3x12 bilateral 2 lbs right no weight left   LAQ right only 5 lbs 3x12   There-act:  Mini squats 3x12  Step up 6 inch right  4 inch left 3x10 each    6/12 Manual: Grade 2 and 3 PA and AP mobilizations to reduce pain and improve mobility of the left knee.  Significant improvement in flexion  noted with manual therapy.  Trigger point release to IT band.  Reviewed self trigger point release to IT band.  Self patellar mobilization. More emphasis on flexion ROM today.   There-ex:  PROM into flexion  SLR 3x10  L only   SAQ 3x12 bilateral 2lbs bilateral 3x12   LAQ right only 5 lbs 3x12   There-act:  Mini squats 3x12  Step up 6 inch right  4 inch left 3x10 each   6/9 Manual: Grade 2 and 3 PA and AP mobilizations to reduce pain and improve mobility of the left knee.  Significant improvement in flexion noted with manual therapy.  Trigger point release to IT band.  Reviewed self trigger point release to IT band.  Self patellar mobilization.   There-ex:  SLR 3x10  L only   SAQ 3x12  2lb 4 lb and 5 lb with progressive RPE to 5   LAQ 5 lb  3x12 RPE of 6  Nustep 5 min     There-act:  Step up  3x15 each leg 6 inch right only  2 inch left leg 3x10   Neuro re-ed  Step onto air-ex 2x12    6/6 Manual: Grade 2 and 3 PA and AP mobilizations to reduce pain and improve mobility of the left knee.  Significant improvement in flexion noted with manual therapy.  Trigger point release to IT band.  Reviewed self trigger point release to IT band.  Self patellar mobilization.   There-ex:  LAQ: Right 2 lb 3x15  Left 3x10 no weight   Neuro-re-ed:  Step onto air-ex 2x10 each leg  Heel/toe rock 3x12  Slow march 3x10  1/3 squat with min cuing for technique 3x10     PATIENT EDUCATION:  Education details: Anatomy of condition, POC, HEP, exercise form/rationale Person educated: Patient Education method: Explanation, Demonstration, Tactile cues, Verbal cues, and Handouts Education comprehension: verbalized understanding, returned demonstration, verbal cues required, tactile cues required, and needs further education  HOME EXERCISE PROGRAM: Access Code: [email or web address] URL: [email or web address]  ASSESSMENT:  CLINICAL IMPRESSION: Therapy continues to work on the  patient's right knee extension and flexion. She had swelling today. She felt better after manual therapy. We continue to work on functional strengthening of the right leg. We have been working on the left as well but held off today 2nd to increased pain from standing. Therapy will continue to progress as tolerated.   REHAB POTENTIAL: Good  CLINICAL DECISION MAKING: Stable/uncomplicated  EVALUATION COMPLEXITY: Low   GOALS: Goals reviewed with patient? Yes  SHORT TERM GOALS: Target date: 08/31/23  Sit<>stand without use of UEs Baseline: Goal status: improving but still having difficulty bending the knee. 5/16  2.  Standing hip hip to neutral extension Baseline:  Goal status: achieved 6/6    LONG TERM GOALS: Target date: POC date  Stand as lon as necessary for work with minimal discomfort Baseline:  Goal status: improving   2.  LEFS to improve by MDC x2 Baseline:  Goal status: progressing 6/8  3.  Hip abd strength to 80% of opposite LE Baseline:  Goal status: improving 6/6 4.  Easily don socks and shoes independently Baseline:  Goal status: INITIAL  5.  Begin training for return to pickle ball Baseline:  Goal status: has not returned 6/6     PLAN:  PT FREQUENCY: 1-2x/week  PT DURATION:8 weeks  PLANNED INTERVENTIONS: 97164- PT Re-evaluation, 97750- Physical Performance Testing, 97110-Therapeutic exercises, 97530- Therapeutic activity, 97112- Neuromuscular re-education, 97535- Self Care, [id number]- Manual therapy, [phone]- Gait training, [phone]- Aquatic Therapy, Patient/Family education, Balance training, Stair training, Taping, Dry Needling, Joint mobilization, Spinal mobilization, Scar mobilization, Cryotherapy, and Moist heat.  PLAN FOR NEXT SESSION: continue flexibility and strength to reduce post pelvic tilt.  Alm Don PT DPT 10/23/23 9:38 AM   Referring diagnosis?  S03.358 (ICD-10-CM) - Status post total replacement of right hip    Treatment diagnosis? (if  different than referring diagnosis) M25.551 What was this (referring dx) caused by? [x]  Surgery []  Fall []  Ongoing issue []  Arthritis []  Other: ____________  Laterality: [x]  Rt []  Lt []  Both  Check all possible CPT codes:  *CHOOSE 10 OR LESS*    See Planned Interventions listed in the Plan section of the Evaluation.

## 2023-10-23 ENCOUNTER — Encounter (HOSPITAL_BASED_OUTPATIENT_CLINIC_OR_DEPARTMENT_OTHER): Payer: Self-pay | Admitting: Physical Therapy

## 2023-11-05 ENCOUNTER — Ambulatory Visit (HOSPITAL_BASED_OUTPATIENT_CLINIC_OR_DEPARTMENT_OTHER): Attending: Orthopaedic Surgery | Admitting: Physical Therapy

## 2023-11-05 ENCOUNTER — Encounter (HOSPITAL_BASED_OUTPATIENT_CLINIC_OR_DEPARTMENT_OTHER): Payer: Self-pay | Admitting: Physical Therapy

## 2023-11-05 DIAGNOSIS — M25562 Pain in left knee: Secondary | ICD-10-CM | POA: Insufficient documentation

## 2023-11-05 DIAGNOSIS — R262 Difficulty in walking, not elsewhere classified: Secondary | ICD-10-CM | POA: Insufficient documentation

## 2023-11-05 DIAGNOSIS — M25551 Pain in right hip: Secondary | ICD-10-CM | POA: Insufficient documentation

## 2023-11-05 DIAGNOSIS — G8929 Other chronic pain: Secondary | ICD-10-CM | POA: Insufficient documentation

## 2023-11-05 DIAGNOSIS — M6281 Muscle weakness (generalized): Secondary | ICD-10-CM | POA: Insufficient documentation

## 2023-11-05 DIAGNOSIS — M25662 Stiffness of left knee, not elsewhere classified: Secondary | ICD-10-CM | POA: Diagnosis not present

## 2023-11-05 NOTE — Therapy (Signed)
 OUTPATIENT PHYSICAL THERAPY / Progress note    Patient Name: Jaime Rose MRN: [id number] DOB:1950-07-12, 73 y.o., female Today's Date: 11/05/2023  END OF SESSION:  PT End of Session - 11/05/23 1103     Visit Number 15    Number of Visits 26    Date for PT Re-Evaluation 12/01/23    Authorization Type 22 approved from 10/14/2023    PT Start Time 1100    PT Stop Time 1141    PT Time Calculation (min) 41 min    Activity Tolerance Patient tolerated treatment well    Behavior During Therapy [facility] for tasks assessed/performed               Past Medical History:  Diagnosis Date   ALLERGIC RHINITIS    Allergy 1985   seasonal allergies   Arthritis    CHICKENPOX, HX OF 12/21/2009   Qualifier: Diagnosis of  By: Inocencio MD, [place] DELENA    Heart murmur    MR   Hyperthyroidism    normal TFTs 11/2009 , dx 10/2011   Pre-diabetes    Varicose vein    s/p sclerosis tx summer 2013   Past Surgical History:  Procedure Laterality Date   ENDOVENOUS ABLATION SAPHENOUS VEIN W/ LASER  04/11/2012   endovenous laser ablation right greater saphenous vein and stab phlebectomy  right leg  by Krystal Doing MD   NO PAST SURGERIES     TOTAL HIP ARTHROPLASTY Right 07/26/2023   Procedure: ARTHROPLASTY, HIP, TOTAL, ANTERIOR APPROACH;  Surgeon: Vernetta Lonni GRADE, MD;  Location: [facility];  Service: Orthopedics;  Laterality: Right;   Patient Active Problem List   Diagnosis Date Noted   Status post total replacement of right hip 07/26/2023   Hyperlipidemia 07/17/2023   Elevated coronary artery calcium  score 07/17/2023   Preoperative clearance 07/17/2023   Mitral regurgitation, moderate-possibly severe 07/11/2023   Vitamin D  deficiency 05/23/2023   Foot pain, left 06/02/2021   Ptosis of right eyelid 04/12/2020   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 12/29/2018   Osteopenia 01/10/2017   Carotid artery stenosis, asymptomatic, bilateral 12/21/2016   Prediabetes 12/21/2016   Hypothyroidism 03/20/2012    Varicose veins of bilateral lower extremities with other complications 12/21/2011   Allergic rhinitis 12/21/2009   Cardiac murmur 12/21/2009  Progress Note Reporting Period 08/12/2023 to 10/04/2023  See note below for Objective Data and Assessment of Progress/Goals.       REFERRING PROVIDER: Lonni Vernetta, MD  REFERRING DIAG:  [phone] (ICD-10-CM) - Status post total replacement of right hip     S/p Rt THA Rationale for Evaluation and Treatment: Rehabilitation  THERAPY DIAG:  Pain in right hip  Difficulty in walking, not elsewhere classified  Chronic pain of left knee  ONSET DATE: DOS 07/26/23   SUBJECTIVE:  SUBJECTIVE STATEMENT: Tightness in proximal Rt quads. Lt knee is feeling better, actually did some steps alternating.   [name]   PERTINENT HISTORY:  none  PAIN:  Are you having pain? Yes: NPRS scale: knee 3/10 hip 0/10 Pain location: Rt laeral hip Pain description: tight, tender Aggravating factors: walking Relieving factors: rest  PRECAUTIONS:  Anterior hip  RED FLAGS: None   WEIGHT BEARING RESTRICTIONS:  No  FALLS:  Has patient fallen in last 6 months? No  LIVING ENVIRONMENT: 4 steps to get into home, bedroom on main floor, has a second story.  OCCUPATION:  Next week to return to work   PLOF:  Independent  PATIENT GOALS:  Don my own sock and shoes. Preserve left knee to avoid TKA, play pickle ball   OBJECTIVE:  Note: Objective measures were completed at Evaluation unless otherwise noted.  PATIENT SURVEYS:  LEFS 28/80 LEFS 7/8 47/80  COGNITIVE STATUS: Within functional limits for tasks assessed   SENSATION: WFL  EDEMA:  Yes: mild  POSTURE:  Lacking full hip extension bilaterally  GAIT: EVAL: antalgic lacking hip extension through stance  phase/toe off   Body Part #1 Hip  PALPATION: Eval- edema and warmth  LOWER EXTREMITY ROM:     Active  Right eval Left eval Rt Lt  Standing 5/19 Right  6/6  Left 6/6  Hip flexion    105   Hip extension -12 -6 -7/-2    Hip abduction       Hip adduction       Hip internal rotation       Hip external rotation    40   Knee flexion       Knee extension 0 -8 -5  -5   (Blank rows = not tested)     Gross strength   Left/ right  flexion 4/4 Hip abduction 4+/4  Knee extension 4+/4                                                                                                                           TREATMENT DATE:  7/8 Supine hip flexion from mod thomas position- cues for core engagement Roller Rt proximal quads Seated hip flexor stretch Trial of hamstring  curl maching Squat with post anchor resistance TKE with resistance.   6/24 Manual: Grade 2 and 3 PA and AP mobilizations to reduce pain and improve mobility of the left knee.  Significant improvement in flexion noted with manual therapy.  Trigger point release to IT band.  Reviewed self trigger point release to IT band.  Self patellar mobilization. More emphasis on flexion ROM today.   There-ex:  PROM into flexion total arc 6-93  SAQ 3x12 2.5 lbs right no weight left  Quad set 3x15  SLR 3x12 right only   There-act:  Step up 2x10 right 4 inch  Side step up 2x10 4 inch right       PATIENT EDUCATION:  Education details: Anatomy of condition, POC,  HEP, exercise form/rationale Person educated: Patient Education method: Explanation, Demonstration, Tactile cues, Verbal cues, and Handouts Education comprehension: verbalized understanding, returned demonstration, verbal cues required, tactile cues required, and needs further education  HOME EXERCISE PROGRAM: Access Code: [email or web address] URL: [email or web address]  ASSESSMENT:  CLINICAL IMPRESSION: Trial of gym machines but did not like for this pt-  she does better with body weight and free weight/resistance exercises. Encouraged hip ext paired with quad set to encourage knee motion without flexed trunk.   REHAB POTENTIAL: Good  CLINICAL DECISION MAKING: Stable/uncomplicated  EVALUATION COMPLEXITY: Low   GOALS: Goals reviewed with patient? Yes  SHORT TERM GOALS: Target date: 08/31/23  Sit<>stand without use of UEs Baseline: Goal status: improving but still having difficulty bending the knee. 5/16  2.  Standing hip hip to neutral extension Baseline:  Goal status: achieved 6/6    LONG TERM GOALS: Target date: POC date  Stand as lon as necessary for work with minimal discomfort Baseline:  Goal status: improving   2.  LEFS to improve by MDC x2 Baseline:  Goal status: progressing 6/8  3.  Hip abd strength to 80% of opposite LE Baseline:  Goal status: improving 6/6 4.  Easily don socks and shoes independently Baseline:  Goal status: INITIAL  5.  Begin training for return to pickle ball Baseline:  Goal status: has not returned 6/6     PLAN:  PT FREQUENCY: 1-2x/week  PT DURATION:8 weeks  PLANNED INTERVENTIONS: 97164- PT Re-evaluation, 97750- Physical Performance Testing, 97110-Therapeutic exercises, 97530- Therapeutic activity, 97112- Neuromuscular re-education, 97535- Self Care, 02859- Manual therapy, [phone]- Gait training, [phone]- Aquatic Therapy, Patient/Family education, Balance training, Stair training, Taping, Dry Needling, Joint mobilization, Spinal mobilization, Scar mobilization, Cryotherapy, and Moist heat.  PLAN FOR NEXT SESSION: continue flexibility and strength to reduce post pelvic tilt.  Dorlene Footman C. Falon Flinchum PT, DPT 11/05/23 11:45 AM    Referring diagnosis?  S03.358 (ICD-10-CM) - Status post total replacement of right hip    Treatment diagnosis? (if different than referring diagnosis) M25.551 What was this (referring dx) caused by? [x]  Surgery []  Fall []  Ongoing issue []  Arthritis []   Other: ____________  Laterality: [x]  Rt []  Lt []  Both  Check all possible CPT codes:  *CHOOSE 10 OR LESS*    See Planned Interventions listed in the Plan section of the Evaluation.

## 2023-11-12 ENCOUNTER — Encounter (HOSPITAL_BASED_OUTPATIENT_CLINIC_OR_DEPARTMENT_OTHER): Admitting: Physical Therapy

## 2023-11-19 ENCOUNTER — Ambulatory Visit (HOSPITAL_BASED_OUTPATIENT_CLINIC_OR_DEPARTMENT_OTHER): Admitting: Physical Therapy

## 2023-11-19 ENCOUNTER — Other Ambulatory Visit: Payer: Self-pay | Admitting: Internal Medicine

## 2023-11-19 DIAGNOSIS — Z1231 Encounter for screening mammogram for malignant neoplasm of breast: Secondary | ICD-10-CM

## 2023-11-20 ENCOUNTER — Ambulatory Visit (HOSPITAL_BASED_OUTPATIENT_CLINIC_OR_DEPARTMENT_OTHER): Admitting: Physical Therapy

## 2023-11-20 ENCOUNTER — Encounter (HOSPITAL_BASED_OUTPATIENT_CLINIC_OR_DEPARTMENT_OTHER): Payer: Self-pay | Admitting: Physical Therapy

## 2023-11-20 DIAGNOSIS — M25551 Pain in right hip: Secondary | ICD-10-CM | POA: Diagnosis not present

## 2023-11-20 DIAGNOSIS — M25662 Stiffness of left knee, not elsewhere classified: Secondary | ICD-10-CM

## 2023-11-20 DIAGNOSIS — G8929 Other chronic pain: Secondary | ICD-10-CM | POA: Diagnosis not present

## 2023-11-20 DIAGNOSIS — M6281 Muscle weakness (generalized): Secondary | ICD-10-CM | POA: Diagnosis not present

## 2023-11-20 DIAGNOSIS — M25562 Pain in left knee: Secondary | ICD-10-CM | POA: Diagnosis not present

## 2023-11-20 DIAGNOSIS — R262 Difficulty in walking, not elsewhere classified: Secondary | ICD-10-CM

## 2023-11-20 NOTE — Therapy (Signed)
 OUTPATIENT PHYSICAL THERAPY / Progress note    Patient Name: Jaime Rose MRN: [id number] DOB:1950/05/14, 73 y.o., female Today's Date: 11/20/2023  END OF SESSION:  PT End of Session - 11/20/23 0859     Visit Number 16    Number of Visits 26    Date for PT Re-Evaluation 12/01/23    Authorization Type 22 approved from 10/14/2023    PT Start Time 0845    PT Stop Time 0928    PT Time Calculation (min) 43 min    Activity Tolerance Patient tolerated treatment well    Behavior During Therapy [facility] for tasks assessed/performed               Past Medical History:  Diagnosis Date   ALLERGIC RHINITIS    Allergy 1985   seasonal allergies   Arthritis    CHICKENPOX, HX OF 12/21/2009   Qualifier: Diagnosis of  By: Inocencio MD, Jaime Rose Jaime Rose    Heart murmur    MR   Hyperthyroidism    normal TFTs 11/2009 , dx 10/2011   Pre-diabetes    Varicose vein    s/p sclerosis tx summer 2013   Past Surgical History:  Procedure Laterality Date   ENDOVENOUS ABLATION SAPHENOUS VEIN W/ LASER  04/11/2012   endovenous laser ablation right greater saphenous vein and stab phlebectomy  right leg  by Jaime Doing MD   NO PAST SURGERIES     TOTAL HIP ARTHROPLASTY Right 07/26/2023   Procedure: ARTHROPLASTY, HIP, TOTAL, ANTERIOR APPROACH;  Surgeon: Rose Jaime GRADE, MD;  Location: [facility];  Service: Orthopedics;  Laterality: Right;   Patient Active Problem List   Diagnosis Date Noted   Status post total replacement of right hip 07/26/2023   Hyperlipidemia 07/17/2023   Elevated coronary artery calcium  score 07/17/2023   Preoperative clearance 07/17/2023   Mitral regurgitation, moderate-possibly severe 07/11/2023   Vitamin D  deficiency 05/23/2023   Foot pain, left 06/02/2021   Ptosis of right eyelid 04/12/2020   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 12/29/2018   Osteopenia 01/10/2017   Carotid artery stenosis, asymptomatic, bilateral 12/21/2016   Prediabetes 12/21/2016   Hypothyroidism 03/20/2012    Varicose veins of bilateral lower extremities with other complications 12/21/2011   Allergic rhinitis 12/21/2009   Cardiac murmur 12/21/2009  Progress Note Reporting Period 08/12/2023 to 10/04/2023  See note below for Objective Data and Assessment of Progress/Goals.       REFERRING PROVIDER: Lonni Vernetta, MD  REFERRING DIAG:  [phone] (ICD-10-CM) - Status post total replacement of right hip     S/p Rt THA Rationale for Evaluation and Treatment: Rehabilitation  THERAPY DIAG:  Pain in right hip  Difficulty in walking, not elsewhere classified  Chronic pain of left knee  Stiffness of left knee, not elsewhere classified  Muscle weakness (generalized)  ONSET DATE: DOS 07/26/23   SUBJECTIVE:  SUBJECTIVE STATEMENT: Patient reports that her hip is Rose great.  She continues to have pain in her left knee.  She reports this comes and goes.  She is interested in looking into stem cell therapy for her knee.  Jaime Rose   PERTINENT HISTORY:  none  PAIN:  Are you having pain? Yes: NPRS scale: knee 3/10 hip 0/10 Pain location: Rt laeral hip Pain description: tight, tender Aggravating factors: walking Relieving factors: rest  PRECAUTIONS:  Anterior hip  RED FLAGS: None   WEIGHT BEARING RESTRICTIONS:  No  FALLS:  Has patient fallen in last 6 months? No  LIVING ENVIRONMENT: 4 steps to get into home, bedroom on main floor, has a second story.  OCCUPATION:  Next week to return to work   PLOF:  Independent  PATIENT GOALS:  Don my own sock and shoes. Preserve left knee to avoid TKA, play pickle ball   OBJECTIVE:  Note: Objective measures were completed at Evaluation unless otherwise noted.  PATIENT SURVEYS:  LEFS 28/80 LEFS 7/8 47/80  COGNITIVE STATUS: Within functional limits  for tasks assessed   SENSATION: WFL  EDEMA:  Yes: mild  POSTURE:  Lacking full hip extension bilaterally  GAIT: EVAL: antalgic lacking hip extension through stance phase/toe off   Body Part #1 Hip  PALPATION: Eval- edema and warmth  LOWER EXTREMITY ROM:     Active  Right eval Left eval Rt Lt  Standing 5/19 Right  6/6  Left 6/6  Hip flexion    105   Hip extension -12 -6 -7/-2    Hip abduction       Hip adduction       Hip internal rotation       Hip external rotation    40   Knee flexion       Knee extension 0 -8 -5  -5   (Blank rows = not tested)     Gross strength   Left/ right  flexion 4/4 Hip abduction 4+/4  Knee extension 4+/4                                                                                                                           TREATMENT DATE:  7/23 Manual: Rose 2 and 3 PA and AP mobilizations to reduce pain and improve mobility of the left knee.  Significant improvement in flexion noted with manual therapy.  Trigger point release to IT band.  Reviewed self trigger point release to IT band.  Self patellar mobilization. More emphasis on flexion ROM today.  There-ex: Leg press 50 lbs 3x12  Hip abduction machine 3x12 45   Neuro-re-ed:  Step onto and off air-ex fwd and lateral x20 each       7/8 Supine hip flexion from mod thomas position- cues for core engagement Roller Rt proximal quads Seated hip flexor stretch Trial of hamstring  curl maching Squat with post anchor resistance TKE with resistance.   6/24 Manual: Rose 2 and 3  PA and AP mobilizations to reduce pain and improve mobility of the left knee.  Significant improvement in flexion noted with manual therapy.  Trigger point release to IT band.  Reviewed self trigger point release to IT band.  Self patellar mobilization. More emphasis on flexion ROM today.   There-ex:  PROM into flexion total arc 6-93  SAQ 3x12 2.5 lbs right no weight left  Quad set 3x15  SLR  3x12 right only   There-act:  Step up 2x10 right 4 inch  Side step up 2x10 4 inch right       PATIENT EDUCATION:  Education details: Anatomy of condition, POC, HEP, exercise form/rationale Person educated: Patient Education method: Explanation, Demonstration, Tactile cues, Verbal cues, and Handouts Education comprehension: verbalized understanding, returned demonstration, verbal cues required, tactile cues required, and needs further education  HOME EXERCISE PROGRAM: Access Code: [email or web address] URL: [email or web address]  ASSESSMENT:  CLINICAL IMPRESSION: The patient tolerated treatment well.  We reviewed to exercise that she can do in the gym to help her knees.  We reviewed how to set of the machines. She tolerated well. She continues to have limited motion of her knee. It improved with manual therapy. Therapy will continue to progress strengthening as tolerated.  REHAB POTENTIAL: Good  CLINICAL DECISION MAKING: Stable/uncomplicated  EVALUATION COMPLEXITY: Low   GOALS: Goals reviewed with patient? Yes  SHORT TERM GOALS: Target date: 08/31/23  Sit<>stand without use of UEs Baseline: Goal status: improving but still having difficulty bending the knee. 5/16  2.  Standing hip hip to neutral extension Baseline:  Goal status: achieved 6/6    LONG TERM GOALS: Target date: POC date  Stand as lon as necessary for work with minimal discomfort Baseline:  Goal status: improving   2.  LEFS to improve by MDC x2 Baseline:  Goal status: progressing 6/8  3.  Hip abd strength to 80% of opposite LE Baseline:  Goal status: improving 6/6 4.  Easily don socks and shoes independently Baseline:  Goal status: INITIAL  5.  Begin training for return to pickle ball Baseline:  Goal status: has not returned 6/6     PLAN:  PT FREQUENCY: 1-2x/week  PT DURATION:8 weeks  PLANNED INTERVENTIONS: 97164- PT Re-evaluation, 97750- Physical Performance Testing,  97110-Therapeutic exercises, 97530- Therapeutic activity, 97112- Neuromuscular re-education, 97535- Self Care, 02859- Manual therapy, [phone]- Gait training, [phone]- Aquatic Therapy, Patient/Family education, Balance training, Stair training, Taping, Dry Needling, Joint mobilization, Spinal mobilization, Scar mobilization, Cryotherapy, and Moist heat.  PLAN FOR NEXT SESSION: continue flexibility and strength to reduce post pelvic tilt.  Jessica C. Hightower PT, DPT 11/20/23 2:38 PM    Referring diagnosis?  S03.358 (ICD-10-CM) - Status post total replacement of right hip    Treatment diagnosis? (if different than referring diagnosis) M25.551 What was this (referring dx) caused by? [x]  Surgery []  Fall []  Ongoing issue []  Arthritis []  Other: ____________  Laterality: [x]  Rt []  Lt []  Both  Check all possible CPT codes:  *CHOOSE 10 OR LESS*    See Planned Interventions listed in the Plan section of the Evaluation.

## 2023-11-21 ENCOUNTER — Encounter: Payer: Self-pay | Admitting: Internal Medicine

## 2023-11-24 MED ORDER — AZITHROMYCIN 500 MG PO TABS: 500.0000 mg | ORAL_TABLET | Freq: Every day | ORAL | 0 refills | Status: AC

## 2023-11-26 ENCOUNTER — Ambulatory Visit (HOSPITAL_BASED_OUTPATIENT_CLINIC_OR_DEPARTMENT_OTHER): Admitting: Physical Therapy

## 2023-11-29 ENCOUNTER — Ambulatory Visit (HOSPITAL_BASED_OUTPATIENT_CLINIC_OR_DEPARTMENT_OTHER): Attending: Orthopaedic Surgery | Admitting: Physical Therapy

## 2023-12-03 ENCOUNTER — Encounter (HOSPITAL_BASED_OUTPATIENT_CLINIC_OR_DEPARTMENT_OTHER): Payer: Self-pay

## 2023-12-03 ENCOUNTER — Ambulatory Visit (HOSPITAL_BASED_OUTPATIENT_CLINIC_OR_DEPARTMENT_OTHER): Attending: Orthopaedic Surgery

## 2023-12-03 DIAGNOSIS — M25551 Pain in right hip: Secondary | ICD-10-CM | POA: Diagnosis not present

## 2023-12-03 DIAGNOSIS — R262 Difficulty in walking, not elsewhere classified: Secondary | ICD-10-CM | POA: Insufficient documentation

## 2023-12-03 DIAGNOSIS — M6281 Muscle weakness (generalized): Secondary | ICD-10-CM | POA: Insufficient documentation

## 2023-12-03 DIAGNOSIS — M25562 Pain in left knee: Secondary | ICD-10-CM | POA: Insufficient documentation

## 2023-12-03 DIAGNOSIS — R2689 Other abnormalities of gait and mobility: Secondary | ICD-10-CM | POA: Insufficient documentation

## 2023-12-03 DIAGNOSIS — M25662 Stiffness of left knee, not elsewhere classified: Secondary | ICD-10-CM | POA: Diagnosis not present

## 2023-12-03 DIAGNOSIS — G8929 Other chronic pain: Secondary | ICD-10-CM | POA: Insufficient documentation

## 2023-12-03 NOTE — Therapy (Addendum)
 OUTPATIENT PHYSICAL THERAPY  Progress Note Reporting Period 10/06/2023 to 12/03/2023  See note below for Objective Data and Assessment of Progress/Goals.        Patient Name: Jaime Rose MRN: [id number] DOB:[birth year], 73 y.o., female Today's Date: 12/03/2023  END OF SESSION:  PT End of Session - 12/03/23 1523     Visit Number 17    Number of Visits 26    Date for PT Re-Evaluation 12/01/23    Authorization Type 22 approved from 10/14/2023    Authorization - Number of Visits 17    PT Start Time 1517    PT Stop Time 1600    PT Time Calculation (min) 43 min    Activity Tolerance Patient tolerated treatment well    Behavior During Therapy [facility] for tasks assessed/performed                Past Medical History:  Diagnosis Date   ALLERGIC RHINITIS    Allergy 1985   seasonal allergies   Arthritis    CHICKENPOX, HX OF 12/21/2009   Qualifier: Diagnosis of  By: Inocencio MD, [place] DELENA    Heart murmur    MR   Hyperthyroidism    normal TFTs 11/2009 , dx 10/2011   Pre-diabetes    Varicose vein    s/p sclerosis tx summer 2013   Past Surgical History:  Procedure Laterality Date   ENDOVENOUS ABLATION SAPHENOUS VEIN W/ LASER  04/11/2012   endovenous laser ablation right greater saphenous vein and stab phlebectomy  right leg  by Krystal Doing MD   NO PAST SURGERIES     TOTAL HIP ARTHROPLASTY Right 07/26/2023   Procedure: ARTHROPLASTY, HIP, TOTAL, ANTERIOR APPROACH;  Surgeon: Vernetta Lonni GRADE, MD;  Location: [facility];  Service: Orthopedics;  Laterality: Right;   Patient Active Problem List   Diagnosis Date Noted   Status post total replacement of right hip 07/26/2023   Hyperlipidemia 07/17/2023   Elevated coronary artery calcium  score 07/17/2023   Preoperative clearance 07/17/2023   Mitral regurgitation, moderate-possibly severe 07/11/2023   Vitamin D  deficiency 05/23/2023   Foot pain, left 06/02/2021   Ptosis of right eyelid 04/12/2020   Glaucoma suspect of left  eye 12/29/2018   Osteopenia 01/10/2017   Carotid artery stenosis, asymptomatic, bilateral 12/21/2016   Prediabetes 12/21/2016   Hypothyroidism 03/20/2012   Varicose veins of bilateral lower extremities with other complications 12/21/2011   Allergic rhinitis 12/21/2009   Cardiac murmur 12/21/2009       REFERRING PROVIDER: Lonni Vernetta, MD  REFERRING DIAG:  [phone] (ICD-10-CM) - Status post total replacement of right hip     S/p Rt THA Rationale for Evaluation and Treatment: Rehabilitation  THERAPY DIAG:  Pain in right hip  Difficulty in walking, not elsewhere classified  Chronic pain of left knee  Stiffness of left knee, not elsewhere classified  Muscle weakness (generalized)  Other abnormalities of gait and mobility  ONSET DATE: DOS 07/26/23   SUBJECTIVE:  SUBJECTIVE STATEMENT: Pt reports the front of her hip feels sore, get somezings sometimes in her thigh. She reports having trouble going down curbs and stairs. Would like to work on this today in clinic.   [name]   PERTINENT HISTORY:  none  PAIN:  Are you having pain? Yes: NPRS scale: knee 3/10 hip 0/10 Pain location: Rt laeral hip Pain description: tight, tender Aggravating factors: walking Relieving factors: rest  PRECAUTIONS:  Anterior hip  RED FLAGS: None   WEIGHT BEARING RESTRICTIONS:  No  FALLS:  Has patient fallen in last 6 months? No  LIVING ENVIRONMENT: 4 steps to get into home, bedroom on main floor, has a second story.  OCCUPATION:  Next week to return to work   PLOF:  Independent  PATIENT GOALS:  Don my own sock and shoes. Preserve left knee to avoid TKA, play pickle ball   OBJECTIVE:  Note: Objective measures were completed at Evaluation unless otherwise noted.  PATIENT SURVEYS:  LEFS  28/80 LEFS 7/8 47/80  COGNITIVE STATUS: Within functional limits for tasks assessed   SENSATION: WFL  EDEMA:  Yes: mild  POSTURE:  Lacking full hip extension bilaterally  GAIT: EVAL: antalgic lacking hip extension through stance phase/toe off   Body Part #1 Hip  PALPATION: Eval- edema and warmth  LOWER EXTREMITY ROM:     Active  Right eval Left eval Rt Lt  Standing 5/19 Right  6/6  Left 6/6 Left 8/5  Hip flexion    105    Hip extension -12 -6 -7/-2     Hip abduction        Hip adduction        Hip internal rotation        Hip external rotation    40  95  Knee flexion        Knee extension 0 -8 -5  -5 -5   (Blank rows = not tested)     Gross strength   Left/ right  flexion 4+/4+ Hip abduction 4+/4  Knee extension 4+/4                                                                                                                           TREATMENT DATE:   8/5: STM/IASTM to ITB and hip flexors to R hip Thomas stretch PROM L knee Sit to stands x10 Eccentric fwd slides from floor level Eccentric step downs 2 step Modified step down from stair Fwd step ups 2x10ea onto airex Partial lunges (knee over toe)  Cybex leg press 60# (seat 7, incline 3) 3x10   7/23 Manual: Grade 2 and 3 PA and AP mobilizations to reduce pain and improve mobility of the left knee.  Significant improvement in flexion noted with manual therapy.  Trigger point release to IT band.  Reviewed self trigger point release to IT band.  Self patellar mobilization. More emphasis on flexion ROM today.  There-ex: Leg press 50 lbs 3x12  Hip abduction  machine 3x12 45   Neuro-re-ed:  Step onto and off air-ex fwd and lateral x20 each       7/8 Supine hip flexion from mod thomas position- cues for core engagement Roller Rt proximal quads Seated hip flexor stretch Trial of hamstring  curl maching Squat with post anchor resistance TKE with resistance.      PATIENT EDUCATION:   Education details: Teacher, music of condition, POC, HEP, exercise form/rationale Person educated: Patient Education method: Explanation, Demonstration, Tactile cues, Verbal cues, and Handouts Education comprehension: verbalized understanding, returned demonstration, verbal cues required, tactile cues required, and needs further education  HOME EXERCISE PROGRAM: Access Code: [email or web address] URL: [email or web address]  ASSESSMENT:  CLINICAL IMPRESSION: Pt has attended 17 visits of PT thus far. Has met 1/2 STG and is working towards LTG. LEFS deferred until next visit due to time constraints. PT report compliance with HEP and gait awareness. She reports improvement with functional activities though some difficulty still present. She is progressing well since IE. L knee ROM does remain limited due to posterior knee tightness and pain. Slight improvement in MMT, though further improvement possible. Working on functional movements including squatting and step ups/downs. Pt significant challenged by modified step down movement due to apprehension and poor knee joint movement. She did respond well to partial knee lunges for closed chain loading. Pt continue to ambulate lacking terminal knee extension and mild/moderate deviation. Pt will benefit from continued PT to address gait, strength, and ROM.   REHAB POTENTIAL: Good  CLINICAL DECISION MAKING: Stable/uncomplicated  EVALUATION COMPLEXITY: Low   GOALS: Goals reviewed with patient? Yes  SHORT TERM GOALS: Target date: 08/31/23  Sit<>stand without use of UEs Baseline: Goal status: IN PROGRESS (improving, but slight weight shift to RLE present) 8/5  2.  Standing hip hip to neutral extension Baseline:  Goal status: achieved 6/6    LONG TERM GOALS: Target date: POC date  Stand as lon as necessary for work with minimal discomfort Baseline:  Goal status: improving 8/5  2.  LEFS to improve by MDC x2 Baseline:  Goal status: progressing  6/8  3.  Hip abd strength to 80% of opposite LE Baseline:  Goal status: improving 8/5 4.  Easily don socks and shoes independently Baseline:  Goal status: IN PROGRESS 8/5  5.  Begin training for return to pickle ball Baseline:  Goal status: has not returned 8/5     PLAN:  PT FREQUENCY: 1-2x/week  PT DURATION:8 weeks  PLANNED INTERVENTIONS: 97164- PT Re-evaluation, 97750- Physical Performance Testing, 97110-Therapeutic exercises, 97530- Therapeutic activity, 97112- Neuromuscular re-education, 97535- Self Care, 02859- Manual therapy, [phone]- Gait training, [phone]- Aquatic Therapy, Patient/Family education, Balance training, Stair training, Taping, Dry Needling, Joint mobilization, Spinal mobilization, Scar mobilization, Cryotherapy, and Moist heat.  PLAN FOR NEXT SESSION: continue to progress functional strengthening   Asberry Rodes, PTA  12/03/23 5:34 PM    Referring diagnosis?  S03.358 (ICD-10-CM) - Status post total replacement of right hip    Treatment diagnosis? (if different than referring diagnosis) M25.551 What was this (referring dx) caused by? [x]  Surgery []  Fall []  Ongoing issue []  Arthritis []  Other: ____________  Laterality: [x]  Rt []  Lt []  Both  Check all possible CPT codes:  *CHOOSE 10 OR LESS*    See Planned Interventions listed in the Plan section of the Evaluation.

## 2023-12-06 ENCOUNTER — Encounter (HOSPITAL_BASED_OUTPATIENT_CLINIC_OR_DEPARTMENT_OTHER)

## 2023-12-10 ENCOUNTER — Ambulatory Visit (HOSPITAL_BASED_OUTPATIENT_CLINIC_OR_DEPARTMENT_OTHER): Admitting: Physical Therapy

## 2023-12-10 DIAGNOSIS — M25551 Pain in right hip: Secondary | ICD-10-CM

## 2023-12-10 DIAGNOSIS — R2689 Other abnormalities of gait and mobility: Secondary | ICD-10-CM | POA: Diagnosis not present

## 2023-12-10 DIAGNOSIS — M25662 Stiffness of left knee, not elsewhere classified: Secondary | ICD-10-CM | POA: Diagnosis not present

## 2023-12-10 DIAGNOSIS — R262 Difficulty in walking, not elsewhere classified: Secondary | ICD-10-CM

## 2023-12-10 DIAGNOSIS — G8929 Other chronic pain: Secondary | ICD-10-CM | POA: Diagnosis not present

## 2023-12-10 DIAGNOSIS — M25562 Pain in left knee: Secondary | ICD-10-CM | POA: Diagnosis not present

## 2023-12-10 DIAGNOSIS — M6281 Muscle weakness (generalized): Secondary | ICD-10-CM | POA: Diagnosis not present

## 2023-12-10 NOTE — Therapy (Signed)
 OUTPATIENT PHYSICAL THERAPY  Progress Note Reporting Period 10/06/2023 to 12/03/2023  See note below for Objective Data and Assessment of Progress/Goals.        Patient Name: Jaime Rose MRN: [id number] DOB:[birth year], 73 y.o., female Today's Date: 12/11/2023  END OF SESSION:  PT End of Session - 12/10/23 1558     Visit Number 18    Number of Visits 26    Date for PT Re-Evaluation 02/04/24    Authorization Type 22 approved from 10/14/2023    PT Start Time 1515    PT Stop Time 1557    PT Time Calculation (min) 42 min    Activity Tolerance Patient tolerated treatment well    Behavior During Therapy [facility] for tasks assessed/performed                 Past Medical History:  Diagnosis Date   ALLERGIC RHINITIS    Allergy 1985   seasonal allergies   Arthritis    CHICKENPOX, HX OF 12/21/2009   Qualifier: Diagnosis of  By: Inocencio MD, [place] DELENA    Heart murmur    MR   Hyperthyroidism    normal TFTs 11/2009 , dx 10/2011   Pre-diabetes    Varicose vein    s/p sclerosis tx summer 2013   Past Surgical History:  Procedure Laterality Date   ENDOVENOUS ABLATION SAPHENOUS VEIN W/ LASER  04/11/2012   endovenous laser ablation right greater saphenous vein and stab phlebectomy  right leg  by Krystal Doing MD   NO PAST SURGERIES     TOTAL HIP ARTHROPLASTY Right 07/26/2023   Procedure: ARTHROPLASTY, HIP, TOTAL, ANTERIOR APPROACH;  Surgeon: Vernetta Lonni GRADE, MD;  Location: [facility];  Service: Orthopedics;  Laterality: Right;   Patient Active Problem List   Diagnosis Date Noted   Status post total replacement of right hip 07/26/2023   Hyperlipidemia 07/17/2023   Elevated coronary artery calcium  score 07/17/2023   Preoperative clearance 07/17/2023   Mitral regurgitation, moderate-possibly severe 07/11/2023   Vitamin D  deficiency 05/23/2023   Foot pain, left 06/02/2021   Ptosis of right eyelid 04/12/2020   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 12/29/2018   Osteopenia  01/10/2017   Carotid artery stenosis, asymptomatic, bilateral 12/21/2016   Prediabetes 12/21/2016   Hypothyroidism 03/20/2012   Varicose veins of bilateral lower extremities with other complications 12/21/2011   Allergic rhinitis 12/21/2009   Cardiac murmur 12/21/2009       REFERRING PROVIDER: Lonni Vernetta, MD  REFERRING DIAG:  [id number] (ICD-10-CM) - Status post total replacement of right hip     S/p Rt THA Rationale for Evaluation and Treatment: Rehabilitation  THERAPY DIAG:  Pain in right hip  Difficulty in walking, not elsewhere classified  ONSET DATE: DOS 07/26/23   SUBJECTIVE:  SUBJECTIVE STATEMENT: Patient reports her knee pain is intermittent.  She reports feels good at times.  Her hip feels tight today but not as tight as it was last visit.  She feels like overall she is making progress. PERTINENT HISTORY:  none  PAIN:  Are you having pain? Yes: NPRS scale: knee 3/10 hip 0/10 Pain location: Rt laeral hip Pain description: tight, tender Aggravating factors: walking Relieving factors: rest  PRECAUTIONS:  Anterior hip  RED FLAGS: None   WEIGHT BEARING RESTRICTIONS:  No  FALLS:  Has patient fallen in last 6 months? No  LIVING ENVIRONMENT: 4 steps to get into home, bedroom on main floor, has a second story.  OCCUPATION:  Next week to return to work   PLOF:  Independent  PATIENT GOALS:  Don my own sock and shoes. Preserve left knee to avoid TKA, play pickle ball   OBJECTIVE:  Note: Objective measures were completed at Evaluation unless otherwise noted.  PATIENT SURVEYS:  LEFS 28/80 LEFS 7/8 47/80  COGNITIVE STATUS: Within functional limits for tasks assessed   SENSATION: WFL  EDEMA:  Yes: mild  POSTURE:  Lacking full hip extension  bilaterally  GAIT: EVAL: antalgic lacking hip extension through stance phase/toe off   Body Part #1 Hip  PALPATION: Eval- edema and warmth  LOWER EXTREMITY ROM:     Active  Right eval Left eval Rt Lt  Standing 5/19 Right  6/6  Left 6/6 Left 8/5  Hip flexion    105    Hip extension -12 -6 -7/-2     Hip abduction        Hip adduction        Hip internal rotation        Hip external rotation    40  95  Knee flexion        Knee extension 0 -8 -5  -5 -5   (Blank rows = not tested)     Gross strength   Left/ right  flexion 4+/4+ Hip abduction 4+/4  Knee extension 4+/4                                                                                                                           TREATMENT DATE:  8/13 Manual: Grade 2 and 3 PA and AP mobilizations to reduce pain and improve mobility of the left knee.  Significant improvement in flexion noted with manual therapy.  Trigger point release to IT band.  Reviewed self trigger point release to IT band.  Self patellar mobilization. More emphasis on flexion ROM today.  Therapeutic activity: Step up 2 x 10 4 inch each leg Lateral step up 2 x 10 4 inch each leg Step down 4 inch right leg 2 inch left leg 2 x 10  Neuromuscular reeducation: Cable walk 15 pounds x 10  There ex: Leg press 70 pounds 3 x 12   8/5: STM/IASTM to ITB and hip flexors to R hip Thomas stretch PROM  L knee Sit to stands x10 Eccentric fwd slides from floor level Eccentric step downs 2 step Modified step down from stair Fwd step ups 2x10ea onto airex Partial lunges (knee over toe)  Cybex leg press 60# (seat 7, incline 3) 3x10   7/23 Manual: Grade 2 and 3 PA and AP mobilizations to reduce pain and improve mobility of the left knee.  Significant improvement in flexion noted with manual therapy.  Trigger point release to IT band.  Reviewed self trigger point release to IT band.  Self patellar mobilization. More emphasis on flexion ROM  today.  There-ex: Leg press 50 lbs 3x12  Hip abduction machine 3x12 45   Neuro-re-ed:  Step onto and off air-ex fwd and lateral x20 each       7/8 Supine hip flexion from mod thomas position- cues for core engagement Roller Rt proximal quads Seated hip flexor stretch Trial of hamstring  curl maching Squat with post anchor resistance TKE with resistance.      PATIENT EDUCATION:  Education details: Teacher, music of condition, POC, HEP, exercise form/rationale Person educated: Patient Education method: Explanation, Demonstration, Tactile cues, Verbal cues, and Handouts Education comprehension: verbalized understanding, returned demonstration, verbal cues required, tactile cues required, and needs further education  HOME EXERCISE PROGRAM: Access Code: [email or web address] URL: [email or web address]  ASSESSMENT:  CLINICAL IMPRESSION: The patient tolerated treatment well today we continue to work on functional strengthening.  Her hip is progressing very well.  She had mild tightness in the front of her hip today.  Therapy reviewed self soft tissue mobilization to the anterior hip.  We continue work on mobility in her left knee to improve her ability to ambulate overall.  Her left knee extension is improving.  Her flexion is improved as well but only mildly.  Therapy will continue to progress as tolerated. REHAB POTENTIAL: Good  CLINICAL DECISION MAKING: Stable/uncomplicated  EVALUATION COMPLEXITY: Low   GOALS: Goals reviewed with patient? Yes  SHORT TERM GOALS: Target date: 08/31/23  Sit<>stand without use of UEs Baseline: Goal status: IN PROGRESS (improving, but slight weight shift to RLE present) 8/5  2.  Standing hip hip to neutral extension Baseline:  Goal status: achieved 6/6    LONG TERM GOALS: Target date: POC date  Stand as lon as necessary for work with minimal discomfort Baseline:  Goal status: improving 8/5  2.  LEFS to improve by MDC x2 Baseline:   Goal status: progressing 6/8  3.  Hip abd strength to 80% of opposite LE Baseline:  Goal status: improving 8/5 4.  Easily don socks and shoes independently Baseline:  Goal status: IN PROGRESS 8/5  5.  Begin training for return to pickle ball Baseline:  Goal status: has not returned 8/5     PLAN:  PT FREQUENCY: 1-2x/week  PT DURATION:8 weeks  PLANNED INTERVENTIONS: 97164- PT Re-evaluation, 97750- Physical Performance Testing, 97110-Therapeutic exercises, 97530- Therapeutic activity, 97112- Neuromuscular re-education, 97535- Self Care, 02859- Manual therapy, [phone]- Gait training, [phone]- Aquatic Therapy, Patient/Family education, Balance training, Stair training, Taping, Dry Needling, Joint mobilization, Spinal mobilization, Scar mobilization, Cryotherapy, and Moist heat.  PLAN FOR NEXT SESSION: continue flexibility and strength to reduce post pelvic tilt.  Asberry Rodes, PTA  12/11/23 3:32 PM    Referring diagnosis?  S03.358 (ICD-10-CM) - Status post total replacement of right hip    Treatment diagnosis? (if different than referring diagnosis) M25.551 What was this (referring dx) caused by? [x]  Surgery []  Fall []  Ongoing issue []  Arthritis []  Other: ____________  Laterality: [  x] Rt []  Lt []  Both  Check all possible CPT codes:  *CHOOSE 10 OR LESS*    See Planned Interventions listed in the Plan section of the Evaluation.

## 2023-12-11 ENCOUNTER — Encounter (HOSPITAL_BASED_OUTPATIENT_CLINIC_OR_DEPARTMENT_OTHER): Payer: Self-pay | Admitting: Physical Therapy

## 2023-12-11 NOTE — Addendum Note (Signed)
 Addended by: DOW ALM PARAS on: 12/11/2023 03:45 PM   Modules accepted: Orders

## 2023-12-13 ENCOUNTER — Ambulatory Visit (HOSPITAL_BASED_OUTPATIENT_CLINIC_OR_DEPARTMENT_OTHER): Admitting: Physical Therapy

## 2023-12-13 DIAGNOSIS — R262 Difficulty in walking, not elsewhere classified: Secondary | ICD-10-CM | POA: Diagnosis not present

## 2023-12-13 DIAGNOSIS — M25662 Stiffness of left knee, not elsewhere classified: Secondary | ICD-10-CM

## 2023-12-13 DIAGNOSIS — M6281 Muscle weakness (generalized): Secondary | ICD-10-CM | POA: Diagnosis not present

## 2023-12-13 DIAGNOSIS — M25551 Pain in right hip: Secondary | ICD-10-CM

## 2023-12-13 DIAGNOSIS — G8929 Other chronic pain: Secondary | ICD-10-CM | POA: Diagnosis not present

## 2023-12-13 DIAGNOSIS — M25562 Pain in left knee: Secondary | ICD-10-CM | POA: Diagnosis not present

## 2023-12-13 DIAGNOSIS — R2689 Other abnormalities of gait and mobility: Secondary | ICD-10-CM | POA: Diagnosis not present

## 2023-12-13 NOTE — Therapy (Signed)
 OUTPATIENT PHYSICAL THERAPY  Progress Note Reporting Period 10/06/2023 to 12/03/2023  See note below for Objective Data and Assessment of Progress/Goals.        Patient Name: Jaime Rose MRN: [id number] DOB:[birth year], 73 y.o., female Today's Date: 12/15/2023  END OF SESSION:  PT End of Session - 12/15/23 1445     Visit Number 19    Number of Visits 26    Date for PT Re-Evaluation 02/04/24    Authorization Type 22 approved from 10/14/2023    PT Start Time 1515    PT Stop Time 1558    PT Time Calculation (min) 43 min    Activity Tolerance Patient tolerated treatment well    Behavior During Therapy [facility] for tasks assessed/performed                  Past Medical History:  Diagnosis Date   ALLERGIC RHINITIS    Allergy 1985   seasonal allergies   Arthritis    CHICKENPOX, HX OF 12/21/2009   Qualifier: Diagnosis of  By: Inocencio MD, [place] DELENA    Heart murmur    MR   Hyperthyroidism    normal TFTs 11/2009 , dx 10/2011   Pre-diabetes    Varicose vein    s/p sclerosis tx summer 2013   Past Surgical History:  Procedure Laterality Date   ENDOVENOUS ABLATION SAPHENOUS VEIN W/ LASER  04/11/2012   endovenous laser ablation right greater saphenous vein and stab phlebectomy  right leg  by Krystal Doing MD   NO PAST SURGERIES     TOTAL HIP ARTHROPLASTY Right 07/26/2023   Procedure: ARTHROPLASTY, HIP, TOTAL, ANTERIOR APPROACH;  Surgeon: Vernetta Lonni GRADE, MD;  Location: [facility];  Service: Orthopedics;  Laterality: Right;   Patient Active Problem List   Diagnosis Date Noted   Status post total replacement of right hip 07/26/2023   Hyperlipidemia 07/17/2023   Elevated coronary artery calcium  score 07/17/2023   Preoperative clearance 07/17/2023   Mitral regurgitation, moderate-possibly severe 07/11/2023   Vitamin D  deficiency 05/23/2023   Foot pain, left 06/02/2021   Ptosis of right eyelid 04/12/2020   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 12/29/2018   Osteopenia  01/10/2017   Carotid artery stenosis, asymptomatic, bilateral 12/21/2016   Prediabetes 12/21/2016   Hypothyroidism 03/20/2012   Varicose veins of bilateral lower extremities with other complications 12/21/2011   Allergic rhinitis 12/21/2009   Cardiac murmur 12/21/2009       REFERRING PROVIDER: Lonni Vernetta, MD  REFERRING DIAG:  [id number] (ICD-10-CM) - Status post total replacement of right hip     S/p Rt THA Rationale for Evaluation and Treatment: Rehabilitation  THERAPY DIAG:  Pain in right hip  Difficulty in walking, not elsewhere classified  Chronic pain of left knee  Stiffness of left knee, not elsewhere classified  ONSET DATE: DOS 07/26/23   SUBJECTIVE:  SUBJECTIVE STATEMENT: The patient is doing well. She has had intermittent pain. She did a class this morning. Her hip is doing well.   PERTINENT HISTORY:  none  PAIN:  Are you having pain? Yes: NPRS scale: knee 3/10 hip 0/10 Pain location: Rt laeral hip Pain description: tight, tender Aggravating factors: walking Relieving factors: rest  PRECAUTIONS:  Anterior hip  RED FLAGS: None   WEIGHT BEARING RESTRICTIONS:  No  FALLS:  Has patient fallen in last 6 months? No  LIVING ENVIRONMENT: 4 steps to get into home, bedroom on main floor, has a second story.  OCCUPATION:  Next week to return to work   PLOF:  Independent  PATIENT GOALS:  Don my own sock and shoes. Preserve left knee to avoid TKA, play pickle ball   OBJECTIVE:  Note: Objective measures were completed at Evaluation unless otherwise noted.  PATIENT SURVEYS:  LEFS 28/80 LEFS 7/8 47/80  COGNITIVE STATUS: Within functional limits for tasks assessed   SENSATION: WFL  EDEMA:  Yes: mild  POSTURE:  Lacking full hip extension  bilaterally  GAIT: EVAL: antalgic lacking hip extension through stance phase/toe off   Body Part #1 Hip  PALPATION: Eval- edema and warmth  LOWER EXTREMITY ROM:     Active  Right eval Left eval Rt Lt  Standing 5/19 Right  6/6  Left 6/6 Left 8/5  Hip flexion    105    Hip extension -12 -6 -7/-2     Hip abduction        Hip adduction        Hip internal rotation        Hip external rotation    40  95  Knee flexion        Knee extension 0 -8 -5  -5 -5   (Blank rows = not tested)     Gross strength   Left/ right  flexion 4+/4+ Hip abduction 4+/4  Knee extension 4+/4                                                                                                                           TREATMENT DATE:  8/15 Manual: Grade 2 and 3 PA and AP mobilizations to reduce pain and improve mobility of the left knee.  Significant improvement in flexion noted with manual therapy.  Trigger point release to IT band.  Reviewed self trigger point release to IT band.  Self patellar mobilization. More emphasis on flexion ROM today.  Therapeutic activity: Step up 2 x 10 4 inch each leg Lateral step up 2 x 10 4 inch each leg Step down 4 inch right leg 2 inch left leg 2 x 10  Neuro-re-ed:  Step onto air-ex 2x10 each leg   8/13 Manual: Grade 2 and 3 PA and AP mobilizations to reduce pain and improve mobility of the left knee.  Significant improvement in flexion noted with manual therapy.  Trigger point release to IT band.  Reviewed  self trigger point release to IT band.  Self patellar mobilization. More emphasis on flexion ROM today.  Therapeutic activity: Step up 2 x 10 4 inch each leg Lateral step up 2 x 10 4 inch each leg Step down 4 inch right leg 2 inch left leg 2 x 10  Neuromuscular reeducation: Cable walk 15 pounds x 10  There ex: Leg press 70 pounds 3 x 12   8/5: STM/IASTM to ITB and hip flexors to R hip Thomas stretch PROM L knee Sit to stands x10 Eccentric fwd  slides from floor level Eccentric step downs 2 step Modified step down from stair Fwd step ups 2x10ea onto airex Partial lunges (knee over toe)  Cybex leg press 60# (seat 7, incline 3) 3x10   7/23 Manual: Grade 2 and 3 PA and AP mobilizations to reduce pain and improve mobility of the left knee.  Significant improvement in flexion noted with manual therapy.  Trigger point release to IT band.  Reviewed self trigger point release to IT band.  Self patellar mobilization. More emphasis on flexion ROM today.  There-ex: Leg press 50 lbs 3x12  Hip abduction machine 3x12 45   Neuro-re-ed:  Step onto and off air-ex fwd and lateral x20 each       7/8 Supine hip flexion from mod thomas position- cues for core engagement Roller Rt proximal quads Seated hip flexor stretch Trial of hamstring  curl maching Squat with post anchor resistance TKE with resistance.      PATIENT EDUCATION:  Education details: Teacher, music of condition, POC, HEP, exercise form/rationale Person educated: Patient Education method: Explanation, Demonstration, Tactile cues, Verbal cues, and Handouts Education comprehension: verbalized understanding, returned demonstration, verbal cues required, tactile cues required, and needs further education  HOME EXERCISE PROGRAM: Access Code: [email or web address] URL: [email or web address]  ASSESSMENT:  CLINICAL IMPRESSION:   Therapy continues to progress functional strengthening with the patient.  We increase her step height for forward step to 6 inches today.  She tolerated well.  She still is limited ability with her left knee.  Her passive range of motion is improving.  I strengthening her left knee taking stress off her right hip.  We continue to work on his stability exercises well.  Patient would benefit from skilled therapy to improve general overall mobility.  CLINICAL DECISION MAKING: Stable/uncomplicated  EVALUATION COMPLEXITY: Low   GOALS: Goals reviewed  with patient? Yes  SHORT TERM GOALS: Target date: 08/31/23  Sit<>stand without use of UEs Baseline: Goal status: IN PROGRESS (improving, but slight weight shift to RLE present) 8/5  2.  Standing hip hip to neutral extension Baseline:  Goal status: achieved 6/6    LONG TERM GOALS: Target date: POC date  Stand as lon as necessary for work with minimal discomfort Baseline:  Goal status: improving 8/5  2.  LEFS to improve by MDC x2 Baseline:  Goal status: progressing 6/8  3.  Hip abd strength to 80% of opposite LE Baseline:  Goal status: improving 8/5 4.  Easily don socks and shoes independently Baseline:  Goal status: IN PROGRESS 8/5  5.  Begin training for return to pickle ball Baseline:  Goal status: has not returned 8/5     PLAN:  PT FREQUENCY: 1-2x/week  PT DURATION:8 weeks  PLANNED INTERVENTIONS: 97164- PT Re-evaluation, 97750- Physical Performance Testing, 97110-Therapeutic exercises, 97530- Therapeutic activity, [id number]- Neuromuscular re-education, 97535- Self Care, [id number]- Manual therapy, [phone]- Gait training, [phone]- Aquatic Therapy, Patient/Family education, Balance training, Stair training, Taping, Dry Needling, Joint mobilization,  Spinal mobilization, Scar mobilization, Cryotherapy, and Moist heat.  PLAN FOR NEXT SESSION: continue flexibility and strength to reduce post pelvic tilt.  Asberry Rodes, PTA  12/15/23 2:47 PM    Referring diagnosis?  S03.358 (ICD-10-CM) - Status post total replacement of right hip    Treatment diagnosis? (if different than referring diagnosis) M25.551 What was this (referring dx) caused by? [x]  Surgery []  Fall []  Ongoing issue []  Arthritis []  Other: ____________  Laterality: [x]  Rt []  Lt []  Both  Check all possible CPT codes:  *CHOOSE 10 OR LESS*    See Planned Interventions listed in the Plan section of the Evaluation.

## 2023-12-15 ENCOUNTER — Encounter (HOSPITAL_BASED_OUTPATIENT_CLINIC_OR_DEPARTMENT_OTHER): Payer: Self-pay | Admitting: Physical Therapy

## 2023-12-17 ENCOUNTER — Ambulatory Visit (HOSPITAL_BASED_OUTPATIENT_CLINIC_OR_DEPARTMENT_OTHER): Admitting: Physical Therapy

## 2023-12-17 ENCOUNTER — Encounter (HOSPITAL_BASED_OUTPATIENT_CLINIC_OR_DEPARTMENT_OTHER): Payer: Self-pay | Admitting: Physical Therapy

## 2023-12-17 DIAGNOSIS — M25562 Pain in left knee: Secondary | ICD-10-CM | POA: Diagnosis not present

## 2023-12-17 DIAGNOSIS — G8929 Other chronic pain: Secondary | ICD-10-CM

## 2023-12-17 DIAGNOSIS — R262 Difficulty in walking, not elsewhere classified: Secondary | ICD-10-CM

## 2023-12-17 DIAGNOSIS — Z1211 Encounter for screening for malignant neoplasm of colon: Secondary | ICD-10-CM | POA: Diagnosis not present

## 2023-12-17 DIAGNOSIS — M25551 Pain in right hip: Secondary | ICD-10-CM

## 2023-12-17 DIAGNOSIS — M6281 Muscle weakness (generalized): Secondary | ICD-10-CM | POA: Diagnosis not present

## 2023-12-17 DIAGNOSIS — R2689 Other abnormalities of gait and mobility: Secondary | ICD-10-CM | POA: Diagnosis not present

## 2023-12-17 DIAGNOSIS — M25662 Stiffness of left knee, not elsewhere classified: Secondary | ICD-10-CM

## 2023-12-17 NOTE — Therapy (Signed)
 OUTPATIENT PHYSICAL THERAPY  Progress Note Reporting Period 10/06/2023 to 12/03/2023  See note below for Objective Data and Assessment of Progress/Goals.        Patient Name: Jaime Rose MRN: [id number] DOB:Jul 18, 1950, 73 y.o., female Today's Date: 12/17/2023  END OF SESSION:  PT End of Session - 12/17/23 1545     Visit Number 20    Number of Visits 26    Date for PT Re-Evaluation 02/04/24    Authorization Type 22 approved from 10/14/2023    PT Start Time 1515    PT Stop Time 1556    PT Time Calculation (min) 41 min    Activity Tolerance Patient tolerated treatment well    Behavior During Therapy [facility] for tasks assessed/performed                  Past Medical History:  Diagnosis Date   ALLERGIC RHINITIS    Allergy 1985   seasonal allergies   Arthritis    CHICKENPOX, HX OF 12/21/2009   Qualifier: Diagnosis of  By: Inocencio MD, [place] DELENA    Heart murmur    MR   Hyperthyroidism    normal TFTs 11/2009 , dx 10/2011   Pre-diabetes    Varicose vein    s/p sclerosis tx summer 2013   Past Surgical History:  Procedure Laterality Date   ENDOVENOUS ABLATION SAPHENOUS VEIN W/ LASER  04/11/2012   endovenous laser ablation right greater saphenous vein and stab phlebectomy  right leg  by Krystal Doing MD   NO PAST SURGERIES     TOTAL HIP ARTHROPLASTY Right 07/26/2023   Procedure: ARTHROPLASTY, HIP, TOTAL, ANTERIOR APPROACH;  Surgeon: Vernetta Lonni GRADE, MD;  Location: [facility];  Service: Orthopedics;  Laterality: Right;   Patient Active Problem List   Diagnosis Date Noted   Status post total replacement of right hip 07/26/2023   Hyperlipidemia 07/17/2023   Elevated coronary artery calcium  score 07/17/2023   Preoperative clearance 07/17/2023   Mitral regurgitation, moderate-possibly severe 07/11/2023   Vitamin D  deficiency 05/23/2023   Foot pain, left 06/02/2021   Ptosis of right eyelid 04/12/2020   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 12/29/2018   Osteopenia  01/10/2017   Carotid artery stenosis, asymptomatic, bilateral 12/21/2016   Prediabetes 12/21/2016   Hypothyroidism 03/20/2012   Varicose veins of bilateral lower extremities with other complications 12/21/2011   Allergic rhinitis 12/21/2009   Cardiac murmur 12/21/2009       REFERRING PROVIDER: Lonni Vernetta, MD  REFERRING DIAG:  [phone] (ICD-10-CM) - Status post total replacement of right hip     S/p Rt THA Rationale for Evaluation and Treatment: Rehabilitation  THERAPY DIAG:  Pain in right hip  Difficulty in walking, not elsewhere classified  Chronic pain of left knee  Stiffness of left knee, not elsewhere classified  ONSET DATE: DOS 07/26/23   SUBJECTIVE:  SUBJECTIVE STATEMENT: The patient is doing well. She has had intermittent pain. She did a class this morning. Her hip is doing well.   PERTINENT HISTORY:  none  PAIN:  Are you having pain? Yes: NPRS scale: knee 3/10 hip 0/10 Pain location: Rt laeral hip Pain description: tight, tender Aggravating factors: walking Relieving factors: rest  PRECAUTIONS:  Anterior hip  RED FLAGS: None   WEIGHT BEARING RESTRICTIONS:  No  FALLS:  Has patient fallen in last 6 months? No  LIVING ENVIRONMENT: 4 steps to get into home, bedroom on main floor, has a second story.  OCCUPATION:  Next week to return to work   PLOF:  Independent  PATIENT GOALS:  Don my own sock and shoes. Preserve left knee to avoid TKA, play pickle ball   OBJECTIVE:  Note: Objective measures were completed at Evaluation unless otherwise noted.  PATIENT SURVEYS:  LEFS 28/80 LEFS 7/8 47/80  COGNITIVE STATUS: Within functional limits for tasks assessed   SENSATION: WFL  EDEMA:  Yes: mild  POSTURE:  Lacking full hip extension  bilaterally  GAIT: EVAL: antalgic lacking hip extension through stance phase/toe off   Body Part #1 Hip  PALPATION: Eval- edema and warmth  LOWER EXTREMITY ROM:     Active  Right eval Left eval Rt Lt  Standing 5/19 Right  6/6  Left 6/6 Left 8/5  Hip flexion    105    Hip extension -12 -6 -7/-2     Hip abduction        Hip adduction        Hip internal rotation        Hip external rotation    40  95  Knee flexion        Knee extension 0 -8 -5  -5 -5   (Blank rows = not tested)     Gross strength   Left/ right  flexion 4+/4+ Hip abduction 4+/4  Knee extension 4+/4                                                                                                                           TREATMENT DATE:  8/19 Manual: Grade 2 and 3 PA and AP mobilizations to reduce pain and improve mobility of the left knee.  Significant improvement in flexion noted with manual therapy.  Trigger point release to IT band.  Reviewed self trigger point release to IT band.  Self patellar mobilization. More emphasis on flexion ROM today.  There-ex:  SAQ 3x12 each leg L 1.5  R 3 lbs   Neuro-re-ed:  Heel/toe rock 3x12  Cable walk 3x12 15 lbs  Slow March 3x10      8/15 Manual: Grade 2 and 3 PA and AP mobilizations to reduce pain and improve mobility of the left knee.  Significant improvement in flexion noted with manual therapy.  Trigger point release to IT band.  Reviewed self trigger point release to IT band.  Self patellar mobilization.  More emphasis on flexion ROM today.  Therapeutic activity: Step up 2 x 10 4 inch each leg Lateral step up 2 x 10 4 inch each leg Step down 4 inch right leg 2 inch left leg 2 x 10  Neuro-re-ed:  Step onto air-ex 2x10 each leg   8/13 Manual: Grade 2 and 3 PA and AP mobilizations to reduce pain and improve mobility of the left knee.  Significant improvement in flexion noted with manual therapy.  Trigger point release to IT band.  Reviewed self  trigger point release to IT band.  Self patellar mobilization. More emphasis on flexion ROM today.  Therapeutic activity: Step up 2 x 10 4 inch each leg Lateral step up 2 x 10 4 inch each leg Step down 4 inch right leg 2 inch left leg 2 x 10  Neuromuscular reeducation: Cable walk 15 pounds x 10  There ex: Leg press 70 pounds 3 x 12   8/5: STM/IASTM to ITB and hip flexors to R hip Thomas stretch PROM L knee Sit to stands x10 Eccentric fwd slides from floor level Eccentric step downs 2 step Modified step down from stair Fwd step ups 2x10ea onto airex Partial lunges (knee over toe)  Cybex leg press 60# (seat 7, incline 3) 3x10   7/23 Manual: Grade 2 and 3 PA and AP mobilizations to reduce pain and improve mobility of the left knee.  Significant improvement in flexion noted with manual therapy.  Trigger point release to IT band.  Reviewed self trigger point release to IT band.  Self patellar mobilization. More emphasis on flexion ROM today.  There-ex: Leg press 50 lbs 3x12  Hip abduction machine 3x12 45   Neuro-re-ed:  Step onto and off air-ex fwd and lateral x20 each       7/8 Supine hip flexion from mod thomas position- cues for core engagement Roller Rt proximal quads Seated hip flexor stretch Trial of hamstring  curl maching Squat with post anchor resistance TKE with resistance.      PATIENT EDUCATION:  Education details: Teacher, music of condition, POC, HEP, exercise form/rationale Person educated: Patient Education method: Explanation, Demonstration, Tactile cues, Verbal cues, and Handouts Education comprehension: verbalized understanding, returned demonstration, verbal cues required, tactile cues required, and needs further education  HOME EXERCISE PROGRAM: Access Code: [email or web address] URL: [email or web address]  ASSESSMENT:  CLINICAL IMPRESSION: Therapy focused on stability exercises for the knee. She tolerated well. Overall she is making good  progress. Her passive knee ROM was measured at 96 degrees today. Her hip is doing very well. We will continue to progress as left knee tolerates.   Therapy continues to progress functional strengthening with the patient.  We increase her step height for forward step to 6 inches today.  She tolerated well.  She still is limited ability with her left knee.  Her passive range of motion is improving.  I strengthening her left knee taking stress off her right hip.  We continue to work on his stability exercises well.  Patient would benefit from skilled therapy to improve general overall mobility.  CLINICAL DECISION MAKING: Stable/uncomplicated  EVALUATION COMPLEXITY: Low   GOALS: Goals reviewed with patient? Yes  SHORT TERM GOALS: Target date: 08/31/23  Sit<>stand without use of UEs Baseline: Goal status: IN PROGRESS (improving, but slight weight shift to RLE present) 8/5  2.  Standing hip hip to neutral extension Baseline:  Goal status: achieved 6/6    LONG TERM GOALS: Target date: POC date  Stand as lon  as necessary for work with minimal discomfort Baseline:  Goal status: improving 8/5  2.  LEFS to improve by MDC x2 Baseline:  Goal status: progressing 6/8  3.  Hip abd strength to 80% of opposite LE Baseline:  Goal status: improving 8/5 4.  Easily don socks and shoes independently Baseline:  Goal status: IN PROGRESS 8/5  5.  Begin training for return to pickle ball Baseline:  Goal status: has not returned 8/5     PLAN:  PT FREQUENCY: 1-2x/week  PT DURATION:8 weeks  PLANNED INTERVENTIONS: 97164- PT Re-evaluation, 97750- Physical Performance Testing, 97110-Therapeutic exercises, 97530- Therapeutic activity, 97112- Neuromuscular re-education, 97535- Self Care, 02859- Manual therapy, [phone]- Gait training, [phone]- Aquatic Therapy, Patient/Family education, Balance training, Stair training, Taping, Dry Needling, Joint mobilization, Spinal mobilization, Scar mobilization,  Cryotherapy, and Moist heat.  PLAN FOR NEXT SESSION: continue flexibility and strength to reduce post pelvic tilt.  Asberry Rodes, PTA  12/17/23 3:49 PM    Referring diagnosis?  S03.358 (ICD-10-CM) - Status post total replacement of right hip    Treatment diagnosis? (if different than referring diagnosis) M25.551 What was this (referring dx) caused by? [x]  Surgery []  Fall []  Ongoing issue []  Arthritis []  Other: ____________  Laterality: [x]  Rt []  Lt []  Both  Check all possible CPT codes:  *CHOOSE 10 OR LESS*    See Planned Interventions listed in the Plan section of the Evaluation.

## 2023-12-18 ENCOUNTER — Ambulatory Visit
Admission: RE | Admit: 2023-12-18 | Discharge: 2023-12-18 | Disposition: A | Discharge status: Home / Self Care | Source: Ambulatory Visit | Attending: Internal Medicine

## 2023-12-18 ENCOUNTER — Encounter (HOSPITAL_BASED_OUTPATIENT_CLINIC_OR_DEPARTMENT_OTHER): Payer: Self-pay | Admitting: Physical Therapy

## 2023-12-18 DIAGNOSIS — Z1231 Encounter for screening mammogram for malignant neoplasm of breast: Secondary | ICD-10-CM

## 2023-12-20 ENCOUNTER — Ambulatory Visit (HOSPITAL_BASED_OUTPATIENT_CLINIC_OR_DEPARTMENT_OTHER): Admitting: Physical Therapy

## 2023-12-20 DIAGNOSIS — R2689 Other abnormalities of gait and mobility: Secondary | ICD-10-CM

## 2023-12-20 DIAGNOSIS — M6281 Muscle weakness (generalized): Secondary | ICD-10-CM

## 2023-12-20 DIAGNOSIS — R262 Difficulty in walking, not elsewhere classified: Secondary | ICD-10-CM | POA: Diagnosis not present

## 2023-12-20 DIAGNOSIS — M25662 Stiffness of left knee, not elsewhere classified: Secondary | ICD-10-CM | POA: Diagnosis not present

## 2023-12-20 DIAGNOSIS — M25551 Pain in right hip: Secondary | ICD-10-CM | POA: Diagnosis not present

## 2023-12-20 DIAGNOSIS — G8929 Other chronic pain: Secondary | ICD-10-CM

## 2023-12-20 DIAGNOSIS — M25562 Pain in left knee: Secondary | ICD-10-CM | POA: Diagnosis not present

## 2023-12-22 ENCOUNTER — Encounter (HOSPITAL_BASED_OUTPATIENT_CLINIC_OR_DEPARTMENT_OTHER): Payer: Self-pay | Admitting: Physical Therapy

## 2023-12-22 NOTE — Therapy (Signed)
 OUTPATIENT PHYSICAL THERAPY  Progress Note Reporting Period 10/06/2023 to 12/03/2023  See note below for Objective Data and Assessment of Progress/Goals.        Patient Name: Jaime Rose MRN: [id number] DOB:[birth year], 73 y.o., female Today's Date: 12/22/2023  END OF SESSION:  PT End of Session - 12/22/23 2053     Visit Number 21    Number of Visits 26    Date for PT Re-Evaluation 02/04/24    Authorization Type 22 approved from 10/14/2023    PT Start Time 1515    PT Stop Time 1558    PT Time Calculation (min) 43 min    Activity Tolerance Patient tolerated treatment well    Behavior During Therapy [facility] for tasks assessed/performed                  Past Medical History:  Diagnosis Date   ALLERGIC RHINITIS    Allergy 1985   seasonal allergies   Arthritis    CHICKENPOX, HX OF 12/21/2009   Qualifier: Diagnosis of  By: Inocencio MD, [place] DELENA    Heart murmur    MR   Hyperthyroidism    normal TFTs 11/2009 , dx 10/2011   Pre-diabetes    Varicose vein    s/p sclerosis tx summer 2013   Past Surgical History:  Procedure Laterality Date   ENDOVENOUS ABLATION SAPHENOUS VEIN W/ LASER  04/11/2012   endovenous laser ablation right greater saphenous vein and stab phlebectomy  right leg  by Krystal Doing MD   NO PAST SURGERIES     TOTAL HIP ARTHROPLASTY Right 07/26/2023   Procedure: ARTHROPLASTY, HIP, TOTAL, ANTERIOR APPROACH;  Surgeon: Vernetta Lonni GRADE, MD;  Location: [facility];  Service: Orthopedics;  Laterality: Right;   Patient Active Problem List   Diagnosis Date Noted   Status post total replacement of right hip 07/26/2023   Hyperlipidemia 07/17/2023   Elevated coronary artery calcium  score 07/17/2023   Preoperative clearance 07/17/2023   Mitral regurgitation, moderate-possibly severe 07/11/2023   Vitamin D  deficiency 05/23/2023   Foot pain, left 06/02/2021   Ptosis of right eyelid 04/12/2020   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 12/29/2018   Osteopenia  01/10/2017   Carotid artery stenosis, asymptomatic, bilateral 12/21/2016   Prediabetes 12/21/2016   Hypothyroidism 03/20/2012   Varicose veins of bilateral lower extremities with other complications 12/21/2011   Allergic rhinitis 12/21/2009   Cardiac murmur 12/21/2009       REFERRING PROVIDER: Lonni Vernetta, MD  REFERRING DIAG:  [phone] (ICD-10-CM) - Status post total replacement of right hip     S/p Rt THA Rationale for Evaluation and Treatment: Rehabilitation  THERAPY DIAG:  Pain in right hip  Difficulty in walking, not elsewhere classified  Chronic pain of left knee  Stiffness of left knee, not elsewhere classified  Muscle weakness (generalized)  Other abnormalities of gait and mobility  ONSET DATE: DOS 07/26/23   SUBJECTIVE:  SUBJECTIVE STATEMENT: Will be going on a trip today.  She is driving 3 hours when she leaves physical therapy.  She reports overall her knee and hip have been doing better.  She is having minimal pain at this time and her knee or hip.  She does feel significant stiffness in her knee.  She feels like she is having a progressive improvement in range of motion in her knee which is also in turn helping her hip.  PERTINENT HISTORY:  none  PAIN:  Are you having pain? Yes: NPRS scale: knee 3/10 hip 0/10 Pain location: Rt laeral hip Pain description: tight, tender Aggravating factors: walking Relieving factors: rest  PRECAUTIONS:  Anterior hip  RED FLAGS: None   WEIGHT BEARING RESTRICTIONS:  No  FALLS:  Has patient fallen in last 6 months? No  LIVING ENVIRONMENT: 4 steps to get into home, bedroom on main floor, has a second story.  OCCUPATION:  Next week to return to work   PLOF:  Independent  PATIENT GOALS:  Don my own sock and shoes.  Preserve left knee to avoid TKA, play pickle ball   OBJECTIVE:  Note: Objective measures were completed at Evaluation unless otherwise noted.  PATIENT SURVEYS:  LEFS 28/80 LEFS 7/8 47/80  COGNITIVE STATUS: Within functional limits for tasks assessed   SENSATION: WFL  EDEMA:  Yes: mild  POSTURE:  Lacking full hip extension bilaterally  GAIT: EVAL: antalgic lacking hip extension through stance phase/toe off   Body Part #1 Hip  PALPATION: Eval- edema and warmth  LOWER EXTREMITY ROM:     Active  Right eval Left eval Rt Lt  Standing 5/19 Right  6/6  Left 6/6 Left 8/5  Hip flexion    105    Hip extension -12 -6 -7/-2     Hip abduction        Hip adduction        Hip internal rotation        Hip external rotation    40  95  Knee flexion        Knee extension 0 -8 -5  -5 -5   (Blank rows = not tested)     Gross strength   Left/ right  flexion 4+/4+ Hip abduction 4+/4  Knee extension 4+/4                                                                                                                           TREATMENT DATE:  8/22 Manual: Grade 2 and 3 PA and AP mobilizations to reduce pain and improve mobility of the left knee.  Significant improvement in flexion noted with manual therapy.  Trigger point release to IT band.  Reviewed self trigger point release to IT band.  Self patellar mobilization. More emphasis on flexion ROM today.  There-ex:  SAQ 3x12 each leg L 1.5  R 3 lbs   SLR 3 x 10 left leg Bilateral hip abduction green band 3 x  10 each leg   Neuro-re-ed:  Heel/toe rock 3x12  Slow March 3x10     8/19 Manual: Grade 2 and 3 PA and AP mobilizations to reduce pain and improve mobility of the left knee.  Significant improvement in flexion noted with manual therapy.  Trigger point release to IT band.  Reviewed self trigger point release to IT band.  Self patellar mobilization. More emphasis on flexion ROM today.  There-ex:  SAQ 3x12 each  leg L 1.5  R 3 lbs   Neuro-re-ed:  Heel/toe rock 3x12  Cable walk 3x12 15 lbs  Slow March 3x10      8/15 Manual: Grade 2 and 3 PA and AP mobilizations to reduce pain and improve mobility of the left knee.  Significant improvement in flexion noted with manual therapy.  Trigger point release to IT band.  Reviewed self trigger point release to IT band.  Self patellar mobilization. More emphasis on flexion ROM today.  Therapeutic activity: Step up 2 x 10 4 inch each leg Lateral step up 2 x 10 4 inch each leg Step down 4 inch right leg 2 inch left leg 2 x 10  Neuro-re-ed:  Step onto air-ex 2x10 each leg   8/13 Manual: Grade 2 and 3 PA and AP mobilizations to reduce pain and improve mobility of the left knee.  Significant improvement in flexion noted with manual therapy.  Trigger point release to IT band.  Reviewed self trigger point release to IT band.  Self patellar mobilization. More emphasis on flexion ROM today.  Therapeutic activity: Step up 2 x 10 4 inch each leg Lateral step up 2 x 10 4 inch each leg Step down 4 inch right leg 2 inch left leg 2 x 10  Neuromuscular reeducation: Cable walk 15 pounds x 10  There ex: Leg press 70 pounds 3 x 12   8/5: STM/IASTM to ITB and hip flexors to R hip Thomas stretch PROM L knee Sit to stands x10 Eccentric fwd slides from floor level Eccentric step downs 2 step Modified step down from stair Fwd step ups 2x10ea onto airex Partial lunges (knee over toe)  Cybex leg press 60# (seat 7, incline 3) 3x10   7/23 Manual: Grade 2 and 3 PA and AP mobilizations to reduce pain and improve mobility of the left knee.  Significant improvement in flexion noted with manual therapy.  Trigger point release to IT band.  Reviewed self trigger point release to IT band.  Self patellar mobilization. More emphasis on flexion ROM today.  There-ex: Leg press 50 lbs 3x12  Hip abduction machine 3x12 45   Neuro-re-ed:  Step onto and off air-ex fwd and  lateral x20 each       7/8 Supine hip flexion from mod thomas position- cues for core engagement Roller Rt proximal quads Seated hip flexor stretch Trial of hamstring  curl maching Squat with post anchor resistance TKE with resistance.      PATIENT EDUCATION:  Education details: Teacher, music of condition, POC, HEP, exercise form/rationale Person educated: Patient Education method: Explanation, Demonstration, Tactile cues, Verbal cues, and Handouts Education comprehension: verbalized understanding, returned demonstration, verbal cues required, tactile cues required, and needs further education  HOME EXERCISE PROGRAM: Access Code: [email or web address] URL: [email or web address]  ASSESSMENT:  CLINICAL IMPRESSION: Therapy The patient is exercise consistent today.  We kept her with low-level strengthening exercises.  We also continue to work on manual therapy of her knee.  Her knee is affecting her ability to ambulate which is  causing stiffness most likely in her hip.  The stiffness in her hip is improved significantly.  She has mild tension in her hip at times.  Will continue to work on functional strengthening over the next few weeks.    CLINICAL DECISION MAKING: Stable/uncomplicated  EVALUATION COMPLEXITY: Low   GOALS: Goals reviewed with patient? Yes  SHORT TERM GOALS: Target date: 08/31/23  Sit<>stand without use of UEs Baseline: Goal status: IN PROGRESS (improving, but slight weight shift to RLE present) 8/5  2.  Standing hip hip to neutral extension Baseline:  Goal status: achieved 6/6    LONG TERM GOALS: Target date: POC date  Stand as lon as necessary for work with minimal discomfort Baseline:  Goal status: improving 8/5  2.  LEFS to improve by MDC x2 Baseline:  Goal status: progressing 6/8  3.  Hip abd strength to 80% of opposite LE Baseline:  Goal status: improving 8/5 4.  Easily don socks and shoes independently Baseline:  Goal status: IN PROGRESS  8/5  5.  Begin training for return to pickle ball Baseline:  Goal status: has not returned 8/5     PLAN:  PT FREQUENCY: 1-2x/week  PT DURATION:8 weeks  PLANNED INTERVENTIONS: 97164- PT Re-evaluation, 97750- Physical Performance Testing, 97110-Therapeutic exercises, 97530- Therapeutic activity, 97112- Neuromuscular re-education, 97535- Self Care, 02859- Manual therapy, [phone]- Gait training, [phone]- Aquatic Therapy, Patient/Family education, Balance training, Stair training, Taping, Dry Needling, Joint mobilization, Spinal mobilization, Scar mobilization, Cryotherapy, and Moist heat.  PLAN FOR NEXT SESSION: continue flexibility and strength to reduce post pelvic tilt.  Asberry Rodes, PTA  12/22/23 8:54 PM    Referring diagnosis?  S03.358 (ICD-10-CM) - Status post total replacement of right hip    Treatment diagnosis? (if different than referring diagnosis) M25.551 What was this (referring dx) caused by? [x]  Surgery []  Fall []  Ongoing issue []  Arthritis []  Other: ____________  Laterality: [x]  Rt []  Lt []  Both  Check all possible CPT codes:  *CHOOSE 10 OR LESS*    See Planned Interventions listed in the Plan section of the Evaluation.

## 2023-12-24 ENCOUNTER — Encounter (HOSPITAL_BASED_OUTPATIENT_CLINIC_OR_DEPARTMENT_OTHER): Payer: Self-pay | Admitting: Physical Therapy

## 2023-12-24 ENCOUNTER — Ambulatory Visit (HOSPITAL_BASED_OUTPATIENT_CLINIC_OR_DEPARTMENT_OTHER): Admitting: Physical Therapy

## 2023-12-24 DIAGNOSIS — M25562 Pain in left knee: Secondary | ICD-10-CM | POA: Diagnosis not present

## 2023-12-24 DIAGNOSIS — R2689 Other abnormalities of gait and mobility: Secondary | ICD-10-CM | POA: Diagnosis not present

## 2023-12-24 DIAGNOSIS — M6281 Muscle weakness (generalized): Secondary | ICD-10-CM

## 2023-12-24 DIAGNOSIS — G8929 Other chronic pain: Secondary | ICD-10-CM

## 2023-12-24 DIAGNOSIS — M25662 Stiffness of left knee, not elsewhere classified: Secondary | ICD-10-CM

## 2023-12-24 DIAGNOSIS — M25551 Pain in right hip: Secondary | ICD-10-CM | POA: Diagnosis not present

## 2023-12-24 DIAGNOSIS — R262 Difficulty in walking, not elsewhere classified: Secondary | ICD-10-CM | POA: Diagnosis not present

## 2023-12-24 NOTE — Therapy (Signed)
 OUTPATIENT PHYSICAL THERAPY  Progress Note Reporting Period 10/06/2023 to 12/03/2023  See note below for Objective Data and Assessment of Progress/Goals.        Patient Name: Jaime Rose MRN: [id number] DOB:1950-10-09, 73 y.o., female Today's Date: 12/24/2023  END OF SESSION:            Past Medical History:  Diagnosis Date   ALLERGIC RHINITIS    Allergy 1985   seasonal allergies   Arthritis    CHICKENPOX, HX OF 12/21/2009   Qualifier: Diagnosis of  By: Inocencio MD, [place] DELENA    Heart murmur    MR   Hyperthyroidism    normal TFTs 11/2009 , dx 10/2011   Pre-diabetes    Varicose vein    s/p sclerosis tx summer 2013   Past Surgical History:  Procedure Laterality Date   ENDOVENOUS ABLATION SAPHENOUS VEIN W/ LASER  04/11/2012   endovenous laser ablation right greater saphenous vein and stab phlebectomy  right leg  by Krystal Doing MD   NO PAST SURGERIES     TOTAL HIP ARTHROPLASTY Right 07/26/2023   Procedure: ARTHROPLASTY, HIP, TOTAL, ANTERIOR APPROACH;  Surgeon: Vernetta Lonni GRADE, MD;  Location: [facility];  Service: Orthopedics;  Laterality: Right;   Patient Active Problem List   Diagnosis Date Noted   Status post total replacement of right hip 07/26/2023   Hyperlipidemia 07/17/2023   Elevated coronary artery calcium  score 07/17/2023   Preoperative clearance 07/17/2023   Mitral regurgitation, moderate-possibly severe 07/11/2023   Vitamin D  deficiency 05/23/2023   Foot pain, left 06/02/2021   Ptosis of right eyelid 04/12/2020   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 12/29/2018   Osteopenia 01/10/2017   Carotid artery stenosis, asymptomatic, bilateral 12/21/2016   Prediabetes 12/21/2016   Hypothyroidism 03/20/2012   Varicose veins of bilateral lower extremities with other complications 12/21/2011   Allergic rhinitis 12/21/2009   Cardiac murmur 12/21/2009       REFERRING PROVIDER: Lonni Vernetta, MD  REFERRING DIAG:  [phone] (ICD-10-CM) - Status  post total replacement of right hip     S/p Rt THA Rationale for Evaluation and Treatment: Rehabilitation  THERAPY DIAG:  No diagnosis found.  ONSET DATE: DOS 07/26/23   SUBJECTIVE:                                                                                                                                                                                           SUBJECTIVE STATEMENT: The patient continues to make good progress.  She is able to take a trip this weekend.  She is able to walk on the beach and also get in and out  of a boat.  She functionally feels like she is improving overall.  She has very little hip pain.  Her knee is still somewhat limiting but improving.  Overall she is having improved ability to go up and down stairs. PERTINENT HISTORY:  none  PAIN:  Are you having pain? Yes: NPRS scale: knee 3/10 hip 0/10 Pain location: Rt laeral hip Pain description: tight, tender Aggravating factors: walking Relieving factors: rest  PRECAUTIONS:  Anterior hip  RED FLAGS: None   WEIGHT BEARING RESTRICTIONS:  No  FALLS:  Has patient fallen in last 6 months? No  LIVING ENVIRONMENT: 4 steps to get into home, bedroom on main floor, has a second story.  OCCUPATION:  Next week to return to work   PLOF:  Independent  PATIENT GOALS:  Don my own sock and shoes. Preserve left knee to avoid TKA, play pickle ball   OBJECTIVE:  Note: Objective measures were completed at Evaluation unless otherwise noted.  PATIENT SURVEYS:  LEFS 28/80 LEFS 7/8 47/80  COGNITIVE STATUS: Within functional limits for tasks assessed   SENSATION: WFL  EDEMA:  Yes: mild  POSTURE:  Lacking full hip extension bilaterally  GAIT: EVAL: antalgic lacking hip extension through stance phase/toe off   Body Part #1 Hip  PALPATION: Eval- edema and warmth  LOWER EXTREMITY ROM:     Active  Right eval Left eval Rt Lt  Standing 5/19 Right  6/6  Left 6/6 Left 8/5 Left 8/26   Hip flexion    105     Hip extension -12 -6 -7/-2      Hip abduction         Hip adduction         Hip internal rotation         Hip external rotation    40  95 98  Knee flexion         Knee extension 0 -8 -5  -5 -5 With poor   (Blank rows = not tested)     Gross strength   Left/ right  flexion 4+/4+ 8/26 flexion 5/5 Hip abduction 4+/4  8/26 5/4+ Knee extension 4+/4    8/26 5/4+                                                                                                                       TREATMENT DATE:   Manual: Grade 2 and 3 PA and AP mobilizations to reduce pain and improve mobility of the left knee.  Significant improvement in flexion noted with manual therapy.  Trigger point release to IT band.  Reviewed self trigger point release to IT band.  Self patellar mobilization. More emphasis on flexion ROM today.  There-ex:  SAQ 3x12 each leg L 1.5  R 3 lbs   SLR 3 x 10 left leg 1 pound weight As her strengthening goals  Neuro re education : Heel toe rock 3 x 12  Therapeutic activity Step up right leg 6 inch 3 x 10  Left leg 4 inch 3 x 10  Stepdown right leg forage 3 times that Left leg 2 inch 2 x 10  8/22 Manual: Grade 2 and 3 PA and AP mobilizations to reduce pain and improve mobility of the left knee.  Significant improvement in flexion noted with manual therapy.  Trigger point release to IT band.  Reviewed self trigger point release to IT band.  Self patellar mobilization. More emphasis on flexion ROM today.  There-ex:  SAQ 3x12 each leg L 1.5  R 3 lbs   SLR 3 x 10 left leg Bilateral hip abduction green band 3 x 10 each leg   Neuro-re-ed:  Heel/toe rock 3x12  Slow March 3x10     8/19 Manual: Grade 2 and 3 PA and AP mobilizations to reduce pain and improve mobility of the left knee.  Significant improvement in flexion noted with manual therapy.  Trigger point release to IT band.  Reviewed self trigger point release to IT band.  Self patellar  mobilization. More emphasis on flexion ROM today.  There-ex:  SAQ 3x12 each leg L 1.5  R 3 lbs   Neuro-re-ed:  Heel/toe rock 3x12  Cable walk 3x12 15 lbs  Slow March 3x10      8/15 Manual: Grade 2 and 3 PA and AP mobilizations to reduce pain and improve mobility of the left knee.  Significant improvement in flexion noted with manual therapy.  Trigger point release to IT band.  Reviewed self trigger point release to IT band.  Self patellar mobilization. More emphasis on flexion ROM today.  Therapeutic activity: Step up 2 x 10 4 inch each leg Lateral step up 2 x 10 4 inch each leg Step down 4 inch right leg 2 inch left leg 2 x 10  Neuro-re-ed:  Step onto air-ex 2x10 each leg   8/13 Manual: Grade 2 and 3 PA and AP mobilizations to reduce pain and improve mobility of the left knee.  Significant improvement in flexion noted with manual therapy.  Trigger point release to IT band.  Reviewed self trigger point release to IT band.  Self patellar mobilization. More emphasis on flexion ROM today.  Therapeutic activity: Step up 2 x 10 4 inch each leg Lateral step up 2 x 10 4 inch each leg Step down 4 inch right leg 2 inch left leg 2 x 10  Neuromuscular reeducation: Cable walk 15 pounds x 10  There ex: Leg press 70 pounds 3 x 12   8/5: STM/IASTM to ITB and hip flexors to R hip Thomas stretch PROM L knee Sit to stands x10 Eccentric fwd slides from floor level Eccentric step downs 2 step Modified step down from stair Fwd step ups 2x10ea onto airex Partial lunges (knee over toe)  Cybex leg press 60# (seat 7, incline 3) 3x10      PATIENT EDUCATION:  Education details: Anatomy of condition, POC, HEP, exercise form/rationale Person educated: Patient Education method: Explanation, Demonstration, Tactile cues, Verbal cues, and Handouts Education comprehension: verbalized understanding, returned demonstration, verbal cues required, tactile cues required, and needs further  education  HOME EXERCISE PROGRAM: Access Code: [email or web address] URL: [email or web address]  ASSESSMENT:  CLINICAL IMPRESSION: Therapy continues to progress functional strength.  We continue to work on stepping down.  We are also working on knee range of motion to improve her ability to go up and down stairs her left knee is directly affecting her ability to progress her right hip.  As we worked on it is continues to  get significantly better.  She would benefit from skilled therapy 2W6.  See below for goal specific progress.   CLINICAL DECISION MAKING: Stable/uncomplicated  EVALUATION COMPLEXITY: Low   GOALS: Goals reviewed with patient? Yes  SHORT TERM GOALS: Target date: 01/21/2024    Sit<>stand without use of UEs Baseline: Goal status: IN PROGRESS (improving, but slight weight shift to RLE present) 8/5  2.  Standing hip hip to neutral extension Baseline:  Goal status: achieved 6/6    LONG TERM GOALS: Target date: 02/18/2024    Stand as lon as necessary for work with minimal discomfort Baseline:  Goal status: improving 8/5  2.  LEFS to improve by MDC x2 Baseline:  Goal status: progressing 6/8  3.  Hip abd strength to 80% of opposite LE Baseline:  Goal status: improving 8/5 4.  Easily don socks and shoes independently Baseline:  Goal status: IN PROGRESS 8/5  5.  Begin training for return to pickle ball Baseline:  Goal status: has not returned 8/5     PLAN:  PT FREQUENCY: 1-2x/week  PT DURATION:8 weeks  PLANNED INTERVENTIONS: 97164- PT Re-evaluation, 97750- Physical Performance Testing, 97110-Therapeutic exercises, 97530- Therapeutic activity, 97112- Neuromuscular re-education, 97535- Self Care, 02859- Manual therapy, [id number]- Gait training, [id number]- Aquatic Therapy, Patient/Family education, Balance training, Stair training, Taping, Dry Needling, Joint mobilization, Spinal mobilization, Scar mobilization, Cryotherapy, and Moist heat.  PLAN FOR NEXT  SESSION: continue flexibility and strength to reduce post pelvic tilt.  Alm Don PT DPT  12/24/23 4:22 PM    Referring diagnosis?  S03.358 (ICD-10-CM) - Status post total replacement of right hip    Treatment diagnosis? (if different than referring diagnosis) M25.551 What was this (referring dx) caused by? [x]  Surgery []  Fall []  Ongoing issue []  Arthritis []  Other: ____________  Laterality: [x]  Rt []  Lt []  Both  Check all possible CPT codes:  *CHOOSE 10 OR LESS*    See Planned Interventions listed in the Plan section of the Evaluation.

## 2023-12-25 ENCOUNTER — Ambulatory Visit: Payer: Self-pay | Admitting: Internal Medicine

## 2023-12-25 LAB — COLOGUARD: COLOGUARD: NEGATIVE

## 2023-12-27 ENCOUNTER — Encounter (HOSPITAL_BASED_OUTPATIENT_CLINIC_OR_DEPARTMENT_OTHER): Payer: Self-pay | Admitting: Physical Therapy

## 2023-12-27 ENCOUNTER — Ambulatory Visit (HOSPITAL_BASED_OUTPATIENT_CLINIC_OR_DEPARTMENT_OTHER): Admitting: Physical Therapy

## 2023-12-27 DIAGNOSIS — M25551 Pain in right hip: Secondary | ICD-10-CM

## 2023-12-27 DIAGNOSIS — G8929 Other chronic pain: Secondary | ICD-10-CM | POA: Diagnosis not present

## 2023-12-27 DIAGNOSIS — R2689 Other abnormalities of gait and mobility: Secondary | ICD-10-CM | POA: Diagnosis not present

## 2023-12-27 DIAGNOSIS — M25562 Pain in left knee: Secondary | ICD-10-CM | POA: Diagnosis not present

## 2023-12-27 DIAGNOSIS — R262 Difficulty in walking, not elsewhere classified: Secondary | ICD-10-CM

## 2023-12-27 DIAGNOSIS — M25662 Stiffness of left knee, not elsewhere classified: Secondary | ICD-10-CM | POA: Diagnosis not present

## 2023-12-27 DIAGNOSIS — M6281 Muscle weakness (generalized): Secondary | ICD-10-CM | POA: Diagnosis not present

## 2023-12-27 NOTE — Therapy (Signed)
 OUTPATIENT PHYSICAL THERAPY  Progress Note Reporting Period 10/06/2023 to 12/03/2023  See note below for Objective Data and Assessment of Progress/Goals.        Patient Name: Jaime Rose MRN: [id number] DOB:1951-02-03, 73 y.o., female Today's Date: 12/27/2023  END OF SESSION:  PT End of Session - 12/27/23 1713     Visit Number 23    Number of Visits 34    Date for PT Re-Evaluation 02/04/24    Authorization Type 22 approved from 10/14/2023    PT Start Time 1515    PT Stop Time 1558    PT Time Calculation (min) 43 min    Activity Tolerance Patient tolerated treatment well    Behavior During Therapy [facility] for tasks assessed/performed                   Past Medical History:  Diagnosis Date   ALLERGIC RHINITIS    Allergy 1985   seasonal allergies   Arthritis    CHICKENPOX, HX OF 12/21/2009   Qualifier: Diagnosis of  By: Inocencio MD, [place] DELENA    Heart murmur    MR   Hyperthyroidism    normal TFTs 11/2009 , dx 10/2011   Pre-diabetes    Varicose vein    s/p sclerosis tx summer 2013   Past Surgical History:  Procedure Laterality Date   ENDOVENOUS ABLATION SAPHENOUS VEIN W/ LASER  04/11/2012   endovenous laser ablation right greater saphenous vein and stab phlebectomy  right leg  by Krystal Doing MD   NO PAST SURGERIES     TOTAL HIP ARTHROPLASTY Right 07/26/2023   Procedure: ARTHROPLASTY, HIP, TOTAL, ANTERIOR APPROACH;  Surgeon: Vernetta Lonni GRADE, MD;  Location: [facility];  Service: Orthopedics;  Laterality: Right;   Patient Active Problem List   Diagnosis Date Noted   Status post total replacement of right hip 07/26/2023   Hyperlipidemia 07/17/2023   Elevated coronary artery calcium  score 07/17/2023   Preoperative clearance 07/17/2023   Mitral regurgitation, moderate-possibly severe 07/11/2023   Vitamin D  deficiency 05/23/2023   Foot pain, left 06/02/2021   Ptosis of right eyelid 04/12/2020   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 12/29/2018   Osteopenia  01/10/2017   Carotid artery stenosis, asymptomatic, bilateral 12/21/2016   Prediabetes 12/21/2016   Hypothyroidism 03/20/2012   Varicose veins of bilateral lower extremities with other complications 12/21/2011   Allergic rhinitis 12/21/2009   Cardiac murmur 12/21/2009       REFERRING PROVIDER: Lonni Vernetta, MD  REFERRING DIAG:  [phone] (ICD-10-CM) - Status post total replacement of right hip     S/p Rt THA Rationale for Evaluation and Treatment: Rehabilitation  THERAPY DIAG:  Pain in right hip  Difficulty in walking, not elsewhere classified  Chronic pain of left knee  ONSET DATE: DOS 07/26/23   SUBJECTIVE:  SUBJECTIVE STATEMENT: The patient continues to make good progress.  She is able to take a trip this weekend.  She is able to walk on the beach and also get in and out of a boat.  She functionally feels like she is improving overall.  She has very little hip pain.  Her knee is still somewhat limiting but improving.  Overall she is having improved ability to go up and down stairs. PERTINENT HISTORY:  none  PAIN:  Are you having pain? Yes: NPRS scale: knee 3/10 hip 0/10 Pain location: Rt laeral hip Pain description: tight, tender Aggravating factors: walking Relieving factors: rest  PRECAUTIONS:  Anterior hip  RED FLAGS: None   WEIGHT BEARING RESTRICTIONS:  No  FALLS:  Has patient fallen in last 6 months? No  LIVING ENVIRONMENT: 4 steps to get into home, bedroom on main floor, has a second story.  OCCUPATION:  Next week to return to work   PLOF:  Independent  PATIENT GOALS:  Don my own sock and shoes. Preserve left knee to avoid TKA, play pickle ball   OBJECTIVE:  Note: Objective measures were completed at Evaluation unless otherwise noted.  PATIENT  SURVEYS:  LEFS 28/80 LEFS 7/8 47/80  COGNITIVE STATUS: Within functional limits for tasks assessed   SENSATION: WFL  EDEMA:  Yes: mild  POSTURE:  Lacking full hip extension bilaterally  GAIT: EVAL: antalgic lacking hip extension through stance phase/toe off   Body Part #1 Hip  PALPATION: Eval- edema and warmth  LOWER EXTREMITY ROM:     Active  Right eval Left eval Rt Lt  Standing 5/19 Right  6/6  Left 6/6 Left 8/5 Left 8/26  Hip flexion    105     Hip extension -12 -6 -7/-2      Hip abduction         Hip adduction         Hip internal rotation         Hip external rotation    40  95 98  Knee flexion         Knee extension 0 -8 -5  -5 -5 With poor   (Blank rows = not tested)     Gross strength   Left/ right  flexion 4+/4+ 8/26 flexion 5/5 Hip abduction 4+/4  8/26 5/4+ Knee extension 4+/4    8/26 5/4+                                                                                                                       TREATMENT DATE:   8/29 Manual: Grade 2 and 3 PA and AP mobilizations to reduce pain and improve mobility of the left knee.  Significant improvement in flexion noted with manual therapy.  Trigger point release to IT band.  Reviewed self trigger point release to IT band.  Self patellar mobilization. More emphasis on flexion ROM today.  There-ex:  SAQ 3x12 each leg L 1.5  R 3 lbs 3 x 12  SLR 3 x 10 left leg 1 pound weight   Neuro re education : Heel toe rock 3 x 12  Therapeutic activity Step up right leg 6 inch 3 x 10 Left leg 4 inch 3 x 10  Stepdown Right leg 4 inch 2 x 10 Left leg 4 inch 2 x 10  8/22 Manual: Grade 2 and 3 PA and AP mobilizations to reduce pain and improve mobility of the left knee.  Significant improvement in flexion noted with manual therapy.  Trigger point release to IT band.  Reviewed self trigger point release to IT band.  Self patellar mobilization. More emphasis on flexion ROM today.  There-ex:  SAQ  3x12 each leg L 1.5  R 3 lbs   SLR 3 x 10 left leg Bilateral hip abduction green band 3 x 10 each leg   Neuro-re-ed:  Heel/toe rock 3x12  Slow March 3x10     8/19 Manual: Grade 2 and 3 PA and AP mobilizations to reduce pain and improve mobility of the left knee.  Significant improvement in flexion noted with manual therapy.  Trigger point release to IT band.  Reviewed self trigger point release to IT band.  Self patellar mobilization. More emphasis on flexion ROM today.  There-ex:  SAQ 3x12 each leg L 1.5  R 3 lbs   Neuro-re-ed:  Heel/toe rock 3x12  Cable walk 3x12 15 lbs  Slow March 3x10      8/15 Manual: Grade 2 and 3 PA and AP mobilizations to reduce pain and improve mobility of the left knee.  Significant improvement in flexion noted with manual therapy.  Trigger point release to IT band.  Reviewed self trigger point release to IT band.  Self patellar mobilization. More emphasis on flexion ROM today.  Therapeutic activity: Step up 2 x 10 4 inch each leg Lateral step up 2 x 10 4 inch each leg Step down 4 inch right leg 2 inch left leg 2 x 10  Neuro-re-ed:  Step onto air-ex 2x10 each leg   8/13 Manual: Grade 2 and 3 PA and AP mobilizations to reduce pain and improve mobility of the left knee.  Significant improvement in flexion noted with manual therapy.  Trigger point release to IT band.  Reviewed self trigger point release to IT band.  Self patellar mobilization. More emphasis on flexion ROM today.  Therapeutic activity: Step up 2 x 10 4 inch each leg Lateral step up 2 x 10 4 inch each leg Step down 4 inch right leg 2 inch left leg 2 x 10  Neuromuscular reeducation: Cable walk 15 pounds x 10  There ex: Leg press 70 pounds 3 x 12   8/5: STM/IASTM to ITB and hip flexors to R hip Thomas stretch PROM L knee Sit to stands x10 Eccentric fwd slides from floor level Eccentric step downs 2 step Modified step down from stair Fwd step ups 2x10ea onto  airex Partial lunges (knee over toe)  Cybex leg press 60# (seat 7, incline 3) 3x10      PATIENT EDUCATION:  Education details: Anatomy of condition, POC, HEP, exercise form/rationale Person educated: Patient Education method: Explanation, Demonstration, Tactile cues, Verbal cues, and Handouts Education comprehension: verbalized understanding, returned demonstration, verbal cues required, tactile cues required, and needs further education  HOME EXERCISE PROGRAM: Access Code: [email or web address] URL: [email or web address]  ASSESSMENT:  CLINICAL IMPRESSION: Patient continues to progress well.  Her total arc of motion on the right knee was measured at 5-101 degrees today.  The first time she has got over 100 degrees with her left knee.  Her improved stability and motion in her left knee is causing less lateral movement with her gait and causing less pain in her right hip with ambulation.  We continue to work on function.  We are working on going up and down stairs with reciprocal pattern.  We also worked on weight shifting today.  Therapy will continue to progress as tolerated.  CLINICAL DECISION MAKING: Stable/uncomplicated  EVALUATION COMPLEXITY: Low   GOALS: Goals reviewed with patient? Yes  SHORT TERM GOALS: Target date: 01/21/2024    Sit<>stand without use of UEs Baseline: Goal status: IN PROGRESS (improving, but slight weight shift to RLE present) 8/5  2.  Standing hip hip to neutral extension Baseline:  Goal status: achieved 6/6    LONG TERM GOALS: Target date: 02/18/2024    Stand as lon as necessary for work with minimal discomfort Baseline:  Goal status: improving 8/5  2.  LEFS to improve by MDC x2 Baseline:  Goal status: progressing 6/8  3.  Hip abd strength to 80% of opposite LE Baseline:  Goal status: improving 8/5 4.  Easily don socks and shoes independently Baseline:  Goal status: IN PROGRESS 8/5  5.  Begin training for return to pickle  ball Baseline:  Goal status: has not returned 8/5     PLAN:  PT FREQUENCY: 1-2x/week  PT DURATION:8 weeks  PLANNED INTERVENTIONS: 97164- PT Re-evaluation, 97750- Physical Performance Testing, 97110-Therapeutic exercises, 97530- Therapeutic activity, 97112- Neuromuscular re-education, 97535- Self Care, [id number]- Manual therapy, [phone]- Gait training, [phone]- Aquatic Therapy, Patient/Family education, Balance training, Stair training, Taping, Dry Needling, Joint mobilization, Spinal mobilization, Scar mobilization, Cryotherapy, and Moist heat.  PLAN FOR NEXT SESSION: continue flexibility and strength to reduce post pelvic tilt.  Alm Don PT DPT  12/27/23 5:16 PM    Referring diagnosis?  S03.358 (ICD-10-CM) - Status post total replacement of right hip    Treatment diagnosis? (if different than referring diagnosis) M25.551 What was this (referring dx) caused by? [x]  Surgery []  Fall []  Ongoing issue []  Arthritis []  Other: ____________  Laterality: [x]  Rt []  Lt []  Both  Check all possible CPT codes:  *CHOOSE 10 OR LESS*    See Planned Interventions listed in the Plan section of the Evaluation.

## 2024-01-03 ENCOUNTER — Ambulatory Visit (HOSPITAL_BASED_OUTPATIENT_CLINIC_OR_DEPARTMENT_OTHER): Payer: Self-pay | Admitting: Physical Therapy

## 2024-01-06 ENCOUNTER — Ambulatory Visit (HOSPITAL_BASED_OUTPATIENT_CLINIC_OR_DEPARTMENT_OTHER): Attending: Orthopaedic Surgery | Admitting: Physical Therapy

## 2024-01-06 DIAGNOSIS — R2689 Other abnormalities of gait and mobility: Secondary | ICD-10-CM | POA: Diagnosis not present

## 2024-01-06 DIAGNOSIS — M6281 Muscle weakness (generalized): Secondary | ICD-10-CM | POA: Diagnosis not present

## 2024-01-06 DIAGNOSIS — G8929 Other chronic pain: Secondary | ICD-10-CM | POA: Insufficient documentation

## 2024-01-06 DIAGNOSIS — M25662 Stiffness of left knee, not elsewhere classified: Secondary | ICD-10-CM | POA: Diagnosis not present

## 2024-01-06 DIAGNOSIS — M25551 Pain in right hip: Secondary | ICD-10-CM | POA: Insufficient documentation

## 2024-01-06 DIAGNOSIS — R262 Difficulty in walking, not elsewhere classified: Secondary | ICD-10-CM | POA: Diagnosis not present

## 2024-01-06 DIAGNOSIS — M25562 Pain in left knee: Secondary | ICD-10-CM | POA: Diagnosis not present

## 2024-01-06 NOTE — Therapy (Signed)
 OUTPATIENT PHYSICAL THERAPY  Progress Note Reporting Period 10/06/2023 to 12/03/2023  See note below for Objective Data and Assessment of Progress/Goals.        Patient Name: Jaime Rose MRN: [id number] DOB:May 25, 1950, 73 y.o., female Today's Date: 01/06/2024  END OF SESSION:  PT End of Session - 01/06/24 0944     Visit Number 24    Number of Visits 34    Date for PT Re-Evaluation 02/04/24                   Past Medical History:  Diagnosis Date   ALLERGIC RHINITIS    Allergy 1985   seasonal allergies   Arthritis    CHICKENPOX, HX OF 12/21/2009   Qualifier: Diagnosis of  By: Inocencio MD, [place] DELENA    Heart murmur    MR   Hyperthyroidism    normal TFTs 11/2009 , dx 10/2011   Pre-diabetes    Varicose vein    s/p sclerosis tx summer 2013   Past Surgical History:  Procedure Laterality Date   ENDOVENOUS ABLATION SAPHENOUS VEIN W/ LASER  04/11/2012   endovenous laser ablation right greater saphenous vein and stab phlebectomy  right leg  by Krystal Doing MD   NO PAST SURGERIES     TOTAL HIP ARTHROPLASTY Right 07/26/2023   Procedure: ARTHROPLASTY, HIP, TOTAL, ANTERIOR APPROACH;  Surgeon: Vernetta Lonni GRADE, MD;  Location: [facility];  Service: Orthopedics;  Laterality: Right;   Patient Active Problem List   Diagnosis Date Noted   Status post total replacement of right hip 07/26/2023   Hyperlipidemia 07/17/2023   Elevated coronary artery calcium  score 07/17/2023   Preoperative clearance 07/17/2023   Mitral regurgitation, moderate-possibly severe 07/11/2023   Vitamin D  deficiency 05/23/2023   Foot pain, left 06/02/2021   Ptosis of right eyelid 04/12/2020   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 12/29/2018   Osteopenia 01/10/2017   Carotid artery stenosis, asymptomatic, bilateral 12/21/2016   Prediabetes 12/21/2016   Hypothyroidism 03/20/2012   Varicose veins of bilateral lower extremities with other complications 12/21/2011   Allergic rhinitis 12/21/2009    Cardiac murmur 12/21/2009       REFERRING PROVIDER: Lonni Vernetta, MD  REFERRING DIAG:  [phone] (ICD-10-CM) - Status post total replacement of right hip     S/p Rt THA Rationale for Evaluation and Treatment: Rehabilitation  THERAPY DIAG:  No diagnosis found.  ONSET DATE: DOS 07/26/23   SUBJECTIVE:  SUBJECTIVE STATEMENT: The patient continues to make good progress.  She is able to take a trip this weekend.  She is able to walk on the beach and also get in and out of a boat.  She functionally feels like she is improving overall.  She has very little hip pain.  Her knee is still somewhat limiting but improving.  Overall she is having improved ability to go up and down stairs. PERTINENT HISTORY:  none  PAIN:  Are you having pain? Yes: NPRS scale: knee 3/10 hip 0/10 Pain location: Rt laeral hip Pain description: tight, tender Aggravating factors: walking Relieving factors: rest  PRECAUTIONS:  Anterior hip  RED FLAGS: None   WEIGHT BEARING RESTRICTIONS:  No  FALLS:  Has patient fallen in last 6 months? No  LIVING ENVIRONMENT: 4 steps to get into home, bedroom on main floor, has a second story.  OCCUPATION:  Next week to return to work   PLOF:  Independent  PATIENT GOALS:  Don my own sock and shoes. Preserve left knee to avoid TKA, play pickle ball   OBJECTIVE:  Note: Objective measures were completed at Evaluation unless otherwise noted.  PATIENT SURVEYS:  LEFS 28/80 LEFS 7/8 47/80  COGNITIVE STATUS: Within functional limits for tasks assessed   SENSATION: WFL  EDEMA:  Yes: mild  POSTURE:  Lacking full hip extension bilaterally  GAIT: EVAL: antalgic lacking hip extension through stance phase/toe off   Body Part #1 Hip  PALPATION: Eval- edema and  warmth  LOWER EXTREMITY ROM:     Active  Right eval Left eval Rt Lt  Standing 5/19 Right  6/6  Left 6/6 Left 8/5 Left 8/26  Hip flexion    105     Hip extension -12 -6 -7/-2      Hip abduction         Hip adduction         Hip internal rotation         Hip external rotation    40  95 98  Knee flexion         Knee extension 0 -8 -5  -5 -5 With poor   (Blank rows = not tested)     Gross strength   Left/ right  flexion 4+/4+ 8/26 flexion 5/5 Hip abduction 4+/4  8/26 5/4+ Knee extension 4+/4    8/26 5/4+                                                                                                                       TREATMENT DATE:  9/8 Manual: Grade 2 and 3 PA and AP mobilizations to reduce pain and improve mobility of the left knee.  Significant improvement in flexion noted with manual therapy.  Trigger point release to IT band.  Reviewed self trigger point release to IT band.  Self patellar mobilization. More emphasis on flexion ROM today.  There-ex:  Nu-step L5 5 min  SLR 3x12 each leg  Neuro-re-ed Heel/rock 3x10  Bridge 3x12   There-act:  Step up 6 inch 2x10 each leg  Step down 4 inch 2x10 each leg    8/29 Manual: Grade 2 and 3 PA and AP mobilizations to reduce pain and improve mobility of the left knee.  Significant improvement in flexion noted with manual therapy.  Trigger point release to IT band.  Reviewed self trigger point release to IT band.  Self patellar mobilization. More emphasis on flexion ROM today.  There-ex:  SAQ 3x12 each leg L 1.5  R 3 lbs 3 x 12  SLR 3 x 10 left leg 1 pound weight   Neuro re education : Heel toe rock 3 x 12  Therapeutic activity Step up right leg 6 inch 3 x 10 Left leg 4 inch 3 x 10  Stepdown Right leg 4 inch 2 x 10 Left leg 4 inch 2 x 10  8/22 Manual: Grade 2 and 3 PA and AP mobilizations to reduce pain and improve mobility of the left knee.  Significant improvement in flexion noted with manual therapy.   Trigger point release to IT band.  Reviewed self trigger point release to IT band.  Self patellar mobilization. More emphasis on flexion ROM today.  There-ex:  SAQ 3x12 each leg L 1.5  R 3 lbs   SLR 3 x 10 left leg Bilateral hip abduction green band 3 x 10 each leg   Neuro-re-ed:  Heel/toe rock 3x12  Slow March 3x10     8/19 Manual: Grade 2 and 3 PA and AP mobilizations to reduce pain and improve mobility of the left knee.  Significant improvement in flexion noted with manual therapy.  Trigger point release to IT band.  Reviewed self trigger point release to IT band.  Self patellar mobilization. More emphasis on flexion ROM today.  There-ex:  SAQ 3x12 each leg L 1.5  R 3 lbs   Neuro-re-ed:  Heel/toe rock 3x12  Cable walk 3x12 15 lbs  Slow March 3x10      8/15 Manual: Grade 2 and 3 PA and AP mobilizations to reduce pain and improve mobility of the left knee.  Significant improvement in flexion noted with manual therapy.  Trigger point release to IT band.  Reviewed self trigger point release to IT band.  Self patellar mobilization. More emphasis on flexion ROM today.  Therapeutic activity: Step up 2 x 10 4 inch each leg Lateral step up 2 x 10 4 inch each leg Step down 4 inch right leg 2 inch left leg 2 x 10  Neuro-re-ed:  Step onto air-ex 2x10 each leg   8/13 Manual: Grade 2 and 3 PA and AP mobilizations to reduce pain and improve mobility of the left knee.  Significant improvement in flexion noted with manual therapy.  Trigger point release to IT band.  Reviewed self trigger point release to IT band.  Self patellar mobilization. More emphasis on flexion ROM today.  Therapeutic activity: Step up 2 x 10 4 inch each leg Lateral step up 2 x 10 4 inch each leg Step down 4 inch right leg 2 inch left leg 2 x 10  Neuromuscular reeducation: Cable walk 15 pounds x 10  There ex: Leg press 70 pounds 3 x 12   8/5: STM/IASTM to ITB and hip flexors to R hip Thomas  stretch PROM L knee Sit to stands x10 Eccentric fwd slides from floor level Eccentric step downs 2 step Modified step down from stair Fwd step ups 2x10ea onto airex Partial  lunges (knee over toe)  Cybex leg press 60# (seat 7, incline 3) 3x10      PATIENT EDUCATION:  Education details: Anatomy of condition, POC, HEP, exercise form/rationale Person educated: Patient Education method: Explanation, Demonstration, Tactile cues, Verbal cues, and Handouts Education comprehension: verbalized understanding, returned demonstration, verbal cues required, tactile cues required, and needs further education  HOME EXERCISE PROGRAM: Access Code: [email or web address] URL: [email or web address]  ASSESSMENT:  CLINICAL IMPRESSION: The patient tolerated treatment well. We advanced her step down on the left to 4 inches and to 6 inches on the left. Her knee is straighter which is improving her gait and decreasing stress on the surgical hip. We will continue to progress functional strength as tolerate.   CLINICAL DECISION MAKING: Stable/uncomplicated  EVALUATION COMPLEXITY: Low   GOALS: Goals reviewed with patient? Yes  SHORT TERM GOALS: Target date: 01/21/2024    Sit<>stand without use of UEs Baseline: Goal status: IN PROGRESS (improving, but slight weight shift to RLE present) 8/5  2.  Standing hip hip to neutral extension Baseline:  Goal status: achieved 6/6    LONG TERM GOALS: Target date: 02/18/2024    Stand as lon as necessary for work with minimal discomfort Baseline:  Goal status: improving 8/5  2.  LEFS to improve by MDC x2 Baseline:  Goal status: progressing 6/8  3.  Hip abd strength to 80% of opposite LE Baseline:  Goal status: improving 8/5 4.  Easily don socks and shoes independently Baseline:  Goal status: IN PROGRESS 8/5  5.  Begin training for return to pickle ball Baseline:  Goal status: has not returned 8/5     PLAN:  PT FREQUENCY:  1-2x/week  PT DURATION:8 weeks  PLANNED INTERVENTIONS: 97164- PT Re-evaluation, 97750- Physical Performance Testing, 97110-Therapeutic exercises, 97530- Therapeutic activity, 97112- Neuromuscular re-education, 97535- Self Care, 02859- Manual therapy, [phone]- Gait training, [phone]- Aquatic Therapy, Patient/Family education, Balance training, Stair training, Taping, Dry Needling, Joint mobilization, Spinal mobilization, Scar mobilization, Cryotherapy, and Moist heat.  PLAN FOR NEXT SESSION: continue flexibility and strength to reduce post pelvic tilt.  Alm Don PT DPT  01/06/24 10:14 AM    Referring diagnosis?  S03.358 (ICD-10-CM) - Status post total replacement of right hip    Treatment diagnosis? (if different than referring diagnosis) M25.551 What was this (referring dx) caused by? [x]  Surgery []  Fall []  Ongoing issue []  Arthritis []  Other: ____________  Laterality: [x]  Rt []  Lt []  Both  Check all possible CPT codes:  *CHOOSE 10 OR LESS*    See Planned Interventions listed in the Plan section of the Evaluation.

## 2024-01-14 ENCOUNTER — Other Ambulatory Visit: Payer: Self-pay | Admitting: Internal Medicine

## 2024-01-16 ENCOUNTER — Other Ambulatory Visit: Payer: Self-pay | Admitting: Orthopaedic Surgery

## 2024-01-23 ENCOUNTER — Ambulatory Visit (HOSPITAL_BASED_OUTPATIENT_CLINIC_OR_DEPARTMENT_OTHER): Admitting: Physical Therapy

## 2024-01-23 ENCOUNTER — Encounter (HOSPITAL_BASED_OUTPATIENT_CLINIC_OR_DEPARTMENT_OTHER): Payer: Self-pay | Admitting: Physical Therapy

## 2024-01-23 DIAGNOSIS — M6281 Muscle weakness (generalized): Secondary | ICD-10-CM | POA: Diagnosis not present

## 2024-01-23 DIAGNOSIS — R262 Difficulty in walking, not elsewhere classified: Secondary | ICD-10-CM | POA: Diagnosis not present

## 2024-01-23 DIAGNOSIS — G8929 Other chronic pain: Secondary | ICD-10-CM | POA: Diagnosis not present

## 2024-01-23 DIAGNOSIS — M25551 Pain in right hip: Secondary | ICD-10-CM

## 2024-01-23 DIAGNOSIS — M25562 Pain in left knee: Secondary | ICD-10-CM | POA: Diagnosis not present

## 2024-01-23 DIAGNOSIS — M25662 Stiffness of left knee, not elsewhere classified: Secondary | ICD-10-CM

## 2024-01-23 DIAGNOSIS — R2689 Other abnormalities of gait and mobility: Secondary | ICD-10-CM | POA: Diagnosis not present

## 2024-01-23 NOTE — Therapy (Unsigned)
 OUTPATIENT PHYSICAL THERAPY  Progress Note Reporting Period 10/06/2023 to 12/03/2023  See note below for Objective Data and Assessment of Progress/Goals.        Patient Name: Jaime Rose MRN: [id number] DOB:1950/09/01, 73 y.o., female Today's Date: 01/24/2024  END OF SESSION:  PT End of Session - 01/23/24 1242     Visit Number 25    Number of Visits 34    Date for Recertification  02/04/24    Authorization Type 22 approved from 10/14/2023    PT Start Time 1230    PT Stop Time 1312    PT Time Calculation (min) 42 min    Activity Tolerance Patient tolerated treatment well    Behavior During Therapy [facility] for tasks assessed/performed                   Past Medical History:  Diagnosis Date   ALLERGIC RHINITIS    Allergy 1985   seasonal allergies   Arthritis    CHICKENPOX, HX OF 12/21/2009   Qualifier: Diagnosis of  By: Inocencio MD, [place] DELENA    Heart murmur    MR   Hyperthyroidism    normal TFTs 11/2009 , dx 10/2011   Pre-diabetes    Varicose vein    s/p sclerosis tx summer 2013   Past Surgical History:  Procedure Laterality Date   ENDOVENOUS ABLATION SAPHENOUS VEIN W/ LASER  04/11/2012   endovenous laser ablation right greater saphenous vein and stab phlebectomy  right leg  by Krystal Doing MD   NO PAST SURGERIES     TOTAL HIP ARTHROPLASTY Right 07/26/2023   Procedure: ARTHROPLASTY, HIP, TOTAL, ANTERIOR APPROACH;  Surgeon: Vernetta Lonni GRADE, MD;  Location: [facility];  Service: Orthopedics;  Laterality: Right;   Patient Active Problem List   Diagnosis Date Noted   Status post total replacement of right hip 07/26/2023   Hyperlipidemia 07/17/2023   Elevated coronary artery calcium  score 07/17/2023   Preoperative clearance 07/17/2023   Mitral regurgitation, moderate-possibly severe 07/11/2023   Vitamin D  deficiency 05/23/2023   Foot pain, left 06/02/2021   Ptosis of right eyelid 04/12/2020   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 12/29/2018   Osteopenia  01/10/2017   Carotid artery stenosis, asymptomatic, bilateral 12/21/2016   Prediabetes 12/21/2016   Hypothyroidism 03/20/2012   Varicose veins of bilateral lower extremities with other complications 12/21/2011   Allergic rhinitis 12/21/2009   Cardiac murmur 12/21/2009       REFERRING PROVIDER: Lonni Vernetta, MD  REFERRING DIAG:  [phone] (ICD-10-CM) - Status post total replacement of right hip     S/p Rt THA Rationale for Evaluation and Treatment: Rehabilitation  THERAPY DIAG:  Pain in right hip  Difficulty in walking, not elsewhere classified  Chronic pain of left knee  Stiffness of left knee, not elsewhere classified  Muscle weakness (generalized)  Other abnormalities of gait and mobility  ONSET DATE: DOS 07/26/23   SUBJECTIVE:  SUBJECTIVE STATEMENT: Patient has done extensive traveling over the past 2 weeks.  She has done a lot of driving.  She has also done things like dancing and walking on the beach.  Her right hip is held up very well.  She has had very little pain.  Her left knee has been painful and stiff.   PERTINENT HISTORY:  none  PAIN:  Are you having pain? Yes: NPRS scale: knee 3/10 hip 0/10 Pain location: Rt laeral hip Pain description: tight, tender Aggravating factors: walking Relieving factors: rest  PRECAUTIONS:  Anterior hip  RED FLAGS: None   WEIGHT BEARING RESTRICTIONS:  No  FALLS:  Has patient fallen in last 6 months? No  LIVING ENVIRONMENT: 4 steps to get into home, bedroom on main floor, has a second story.  OCCUPATION:  Next week to return to work   PLOF:  Independent  PATIENT GOALS:  Don my own sock and shoes. Preserve left knee to avoid TKA, play pickle ball   OBJECTIVE:  Note: Objective measures were completed at Evaluation  unless otherwise noted.  PATIENT SURVEYS:  LEFS 28/80 LEFS 7/8 47/80  COGNITIVE STATUS: Within functional limits for tasks assessed   SENSATION: WFL  EDEMA:  Yes: mild  POSTURE:  Lacking full hip extension bilaterally  GAIT: EVAL: antalgic lacking hip extension through stance phase/toe off   Body Part #1 Hip  PALPATION: Eval- edema and warmth  LOWER EXTREMITY ROM:     Active  Right eval Left eval Rt Lt  Standing 5/19 Right  6/6  Left 6/6 Left 8/5 Left 8/26 left  Hip flexion    105      Hip extension -12 -6 -7/-2       Hip abduction          Hip adduction          Hip internal rotation          Hip external rotation    40  95 98 101  Knee flexion          Knee extension 0 -8 -5  -5 -5 With poor -10   (Blank rows = not tested)     Gross strength   Left/ right  flexion 4+/4+ 8/26 flexion 5/5 Hip abduction 4+/4  8/26 5/4+ Knee extension 4+/4    8/26 5/4+                                                                                                                       TREATMENT DATE:  09/25 Manual: Grade 2 and 3 PA and AP mobilizations to reduce pain and improve mobility of the left knee.  Significant improvement in flexion noted with manual therapy.  Trigger point release to IT band.  Reviewed self trigger point release to IT band.  Self patellar mobilization. More emphasis on flexion ROM today.  There-ex:  Nu-step L5 5 min  SLR 3x12 each leg  LAQ 3x12   Neuro-re-ed:  Heel toe rock 3x12  Slow march with UE support 2x10      9/8 Manual: Grade 2 and 3 PA and AP mobilizations to reduce pain and improve mobility of the left knee.  Significant improvement in flexion noted with manual therapy.  Trigger point release to IT band.  Reviewed self trigger point release to IT band.  Self patellar mobilization. More emphasis on flexion ROM today.  There-ex:  Nu-step L5 5 min  SLR 3x12 each leg  Neuro-re-ed Heel/rock 3x10  Bridge 3x12    There-act:  Step up 6 inch 2x10 each leg  Step down 4 inch 2x10 each leg    8/29 Manual: Grade 2 and 3 PA and AP mobilizations to reduce pain and improve mobility of the left knee.  Significant improvement in flexion noted with manual therapy.  Trigger point release to IT band.  Reviewed self trigger point release to IT band.  Self patellar mobilization. More emphasis on flexion ROM today.  There-ex:  SAQ 3x12 each leg L 1.5  R 3 lbs 3 x 12  SLR 3 x 10 left leg 1 pound weight   Neuro re education : Heel toe rock 3 x 12  Therapeutic activity Step up right leg 6 inch 3 x 10 Left leg 4 inch 3 x 10  Stepdown Right leg 4 inch 2 x 10 Left leg 4 inch 2 x 10  8/22 Manual: Grade 2 and 3 PA and AP mobilizations to reduce pain and improve mobility of the left knee.  Significant improvement in flexion noted with manual therapy.  Trigger point release to IT band.  Reviewed self trigger point release to IT band.  Self patellar mobilization. More emphasis on flexion ROM today.  There-ex:  SAQ 3x12 each leg L 1.5  R 3 lbs   SLR 3 x 10 left leg Bilateral hip abduction green band 3 x 10 each leg   Neuro-re-ed:  Heel/toe rock 3x12  Slow March 3x10     8/19 Manual: Grade 2 and 3 PA and AP mobilizations to reduce pain and improve mobility of the left knee.  Significant improvement in flexion noted with manual therapy.  Trigger point release to IT band.  Reviewed self trigger point release to IT band.  Self patellar mobilization. More emphasis on flexion ROM today.  There-ex:  SAQ 3x12 each leg L 1.5  R 3 lbs   Neuro-re-ed:  Heel/toe rock 3x12  Cable walk 3x12 15 lbs  Slow March 3x10      8/15 Manual: Grade 2 and 3 PA and AP mobilizations to reduce pain and improve mobility of the left knee.  Significant improvement in flexion noted with manual therapy.  Trigger point release to IT band.  Reviewed self trigger point release to IT band.  Self patellar mobilization. More  emphasis on flexion ROM today.  Therapeutic activity: Step up 2 x 10 4 inch each leg Lateral step up 2 x 10 4 inch each leg Step down 4 inch right leg 2 inch left leg 2 x 10  Neuro-re-ed:  Step onto air-ex 2x10 each leg   8/13 Manual: Grade 2 and 3 PA and AP mobilizations to reduce pain and improve mobility of the left knee.  Significant improvement in flexion noted with manual therapy.  Trigger point release to IT band.  Reviewed self trigger point release to IT band.  Self patellar mobilization. More emphasis on flexion ROM today.  Therapeutic activity: Step up 2 x 10 4 inch each leg Lateral step up  2 x 10 4 inch each leg Step down 4 inch right leg 2 inch left leg 2 x 10  Neuromuscular reeducation: Cable walk 15 pounds x 10  There ex: Leg press 70 pounds 3 x 12   8/5: STM/IASTM to ITB and hip flexors to R hip Thomas stretch PROM L knee Sit to stands x10 Eccentric fwd slides from floor level Eccentric step downs 2 step Modified step down from stair Fwd step ups 2x10ea onto airex Partial lunges (knee over toe)  Cybex leg press 60# (seat 7, incline 3) 3x10      PATIENT EDUCATION:  Education details: Anatomy of condition, POC, HEP, exercise form/rationale Person educated: Patient Education method: Explanation, Demonstration, Tactile cues, Verbal cues, and Handouts Education comprehension: verbalized understanding, returned demonstration, verbal cues required, tactile cues required, and needs further education  HOME EXERCISE PROGRAM: Access Code: [email or web address] URL: [email or web address]  ASSESSMENT:  CLINICAL IMPRESSION: Therapy continues work on manual therapy for her right hip and left knee.  Her left knee range of motion was measured at 10 to 101 degrees of flexion today.  Will continue to work on her knee range of motion as it affects her gait on the right side.  We reviewed her basic exercises.  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  Therapy will continue to progress as tolerated.  CLINICAL DECISION MAKING: Stable/uncomplicated  EVALUATION COMPLEXITY: Low   GOALS: Goals reviewed with patient? Yes  SHORT TERM GOALS: Target date: 01/21/2024    Sit<>stand without use of UEs Baseline: Goal status: IN PROGRESS (improving, but slight weight shift to RLE present) 8/5  2.  Standing hip hip to neutral extension Baseline:  Goal status: achieved 6/6    LONG TERM GOALS: Target date: 02/18/2024    Stand as lon as necessary for work with minimal discomfort Baseline:  Goal status: improving 8/5  2.  LEFS to improve by MDC x2 Baseline:  Goal status: progressing 6/8  3.  Hip abd strength to 80% of opposite LE Baseline:  Goal status: improving 8/5 4.  Easily don socks and shoes independently Baseline:  Goal status: IN PROGRESS 8/5  5.  Begin training for return to pickle ball Baseline:  Goal status: has not returned 8/5     PLAN:  PT FREQUENCY: 1-2x/week  PT DURATION:8 weeks  PLANNED INTERVENTIONS: 97164- PT Re-evaluation, 97750- Physical Performance Testing, 97110-Therapeutic exercises, 97530- Therapeutic activity, 97112- Neuromuscular re-education, 97535- Self Care, [id number]- Manual therapy, [phone]- Gait training, [phone]- Aquatic Therapy, Patient/Family education, Balance training, Stair training, Taping, Dry Needling, Joint mobilization, Spinal mobilization, Scar mobilization, Cryotherapy, and Moist heat.  PLAN FOR NEXT SESSION: continue flexibility and strength to reduce post pelvic tilt.  Alm Don PT DPT  01/24/24 10:32 AM    Referring diagnosis?  S03.358 (ICD-10-CM) - Status post total replacement of right hip    Treatment diagnosis? (if different than referring diagnosis) M25.551 What was this (referring dx) caused by? [x]  Surgery []  Fall []  Ongoing issue []  Arthritis []  Other: ____________  Laterality: [x]  Rt []  Lt []  Both  Check all possible CPT  codes:  *CHOOSE 10 OR LESS*    See Planned Interventions listed in the Plan section of the Evaluation.

## 2024-01-24 ENCOUNTER — Encounter (HOSPITAL_BASED_OUTPATIENT_CLINIC_OR_DEPARTMENT_OTHER): Payer: Self-pay | Admitting: Physical Therapy

## 2024-01-29 ENCOUNTER — Encounter (HOSPITAL_BASED_OUTPATIENT_CLINIC_OR_DEPARTMENT_OTHER): Payer: Self-pay | Admitting: Physical Therapy

## 2024-01-29 ENCOUNTER — Ambulatory Visit (HOSPITAL_BASED_OUTPATIENT_CLINIC_OR_DEPARTMENT_OTHER): Attending: Orthopaedic Surgery | Admitting: Physical Therapy

## 2024-01-29 DIAGNOSIS — R262 Difficulty in walking, not elsewhere classified: Secondary | ICD-10-CM | POA: Insufficient documentation

## 2024-01-29 DIAGNOSIS — M6281 Muscle weakness (generalized): Secondary | ICD-10-CM | POA: Diagnosis not present

## 2024-01-29 DIAGNOSIS — M25662 Stiffness of left knee, not elsewhere classified: Secondary | ICD-10-CM | POA: Diagnosis not present

## 2024-01-29 DIAGNOSIS — M25562 Pain in left knee: Secondary | ICD-10-CM | POA: Diagnosis not present

## 2024-01-29 DIAGNOSIS — R2689 Other abnormalities of gait and mobility: Secondary | ICD-10-CM | POA: Diagnosis not present

## 2024-01-29 DIAGNOSIS — M25551 Pain in right hip: Secondary | ICD-10-CM | POA: Diagnosis not present

## 2024-01-29 DIAGNOSIS — G8929 Other chronic pain: Secondary | ICD-10-CM | POA: Diagnosis not present

## 2024-01-29 NOTE — Therapy (Unsigned)
 OUTPATIENT PHYSICAL THERAPY  Progress Note Reporting Period 10/06/2023 to 12/03/2023  See note below for Objective Data and Assessment of Progress/Goals.        Patient Name: Jaime Rose MRN: [id number] DOB:01/10/1951, 73 y.o., female Today's Date: 01/30/2024  END OF SESSION:  PT End of Session - 01/29/24 1535     Visit Number 26    Number of Visits 34    Date for Recertification  02/04/24    Authorization Type 22 approved from 10/14/2023    PT Start Time 1517    PT Stop Time 1558    PT Time Calculation (min) 41 min    Activity Tolerance Patient tolerated treatment well    Behavior During Therapy [facility] for tasks assessed/performed                   Past Medical History:  Diagnosis Date   ALLERGIC RHINITIS    Allergy 1985   seasonal allergies   Arthritis    CHICKENPOX, HX OF 12/21/2009   Qualifier: Diagnosis of  By: Inocencio MD, [place] DELENA    Heart murmur    MR   Hyperthyroidism    normal TFTs 11/2009 , dx 10/2011   Pre-diabetes    Varicose vein    s/p sclerosis tx summer 2013   Past Surgical History:  Procedure Laterality Date   ENDOVENOUS ABLATION SAPHENOUS VEIN W/ LASER  04/11/2012   endovenous laser ablation right greater saphenous vein and stab phlebectomy  right leg  by Krystal Doing MD   NO PAST SURGERIES     TOTAL HIP ARTHROPLASTY Right 07/26/2023   Procedure: ARTHROPLASTY, HIP, TOTAL, ANTERIOR APPROACH;  Surgeon: Vernetta Lonni GRADE, MD;  Location: [facility];  Service: Orthopedics;  Laterality: Right;   Patient Active Problem List   Diagnosis Date Noted   Status post total replacement of right hip 07/26/2023   Hyperlipidemia 07/17/2023   Elevated coronary artery calcium  score 07/17/2023   Preoperative clearance 07/17/2023   Mitral regurgitation, moderate-possibly severe 07/11/2023   Vitamin D  deficiency 05/23/2023   Foot pain, left 06/02/2021   Ptosis of right eyelid 04/12/2020   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 12/29/2018   Osteopenia  01/10/2017   Carotid artery stenosis, asymptomatic, bilateral 12/21/2016   Prediabetes 12/21/2016   Hypothyroidism 03/20/2012   Varicose veins of bilateral lower extremities with other complications 12/21/2011   Allergic rhinitis 12/21/2009   Cardiac murmur 12/21/2009       REFERRING PROVIDER: Lonni Vernetta, MD  REFERRING DIAG:  [phone] (ICD-10-CM) - Status post total replacement of right hip     S/p Rt THA Rationale for Evaluation and Treatment: Rehabilitation  THERAPY DIAG:  Pain in right hip  Difficulty in walking, not elsewhere classified  Chronic pain of left knee  ONSET DATE: DOS 07/26/23   SUBJECTIVE:  SUBJECTIVE STATEMENT: The patient was sore yesterday but she is doing well today. She reports overall it is improving.    PERTINENT HISTORY:  none  PAIN:  Are you having pain? Yes: NPRS scale: knee 3/10 hip 0/10 Pain location: Rt laeral hip Pain description: tight, tender Aggravating factors: walking Relieving factors: rest  PRECAUTIONS:  Anterior hip  RED FLAGS: None   WEIGHT BEARING RESTRICTIONS:  No  FALLS:  Has patient fallen in last 6 months? No  LIVING ENVIRONMENT: 4 steps to get into home, bedroom on main floor, has a second story.  OCCUPATION:  Next week to return to work   PLOF:  Independent  PATIENT GOALS:  Don my own sock and shoes. Preserve left knee to avoid TKA, play pickle ball   OBJECTIVE:  Note: Objective measures were completed at Evaluation unless otherwise noted.  PATIENT SURVEYS:  LEFS 28/80 LEFS 7/8 47/80  COGNITIVE STATUS: Within functional limits for tasks assessed   SENSATION: WFL  EDEMA:  Yes: mild  POSTURE:  Lacking full hip extension bilaterally  GAIT: EVAL: antalgic lacking hip extension through stance  phase/toe off   Body Part #1 Hip  PALPATION: Eval- edema and warmth  LOWER EXTREMITY ROM:     Active  Right eval Left eval Rt Lt  Standing 5/19 Right  6/6  Left 6/6 Left 8/5 Left 8/26 left  Hip flexion    105      Hip extension -12 -6 -7/-2       Hip abduction          Hip adduction          Hip internal rotation          Hip external rotation    40  95 98 101  Knee flexion          Knee extension 0 -8 -5  -5 -5 With poor -10   (Blank rows = not tested)     Gross strength   Left/ right  flexion 4+/4+ 8/26 flexion 5/5 Hip abduction 4+/4  8/26 5/4+ Knee extension 4+/4    8/26 5/4+                                                                                                                       TREATMENT DATE:  10/1 Manual: Grade 2 and 3 PA and AP mobilizations to reduce pain and improve mobility of the left knee.  Significant improvement in flexion noted with manual therapy.  Trigger point release to IT band.  Reviewed self trigger point release to IT band.  Self patellar mobilization. More emphasis on flexion ROM today.  There-ex:  There-ex:  Nu-step L5 5 min  SLR 2x12 each leg  LAQ 3x12  SAQ 2x20 2 lbs R L 1.5 bs   Step up  4 inch 2x10 fwd  4 inch lateral 2x10     09/25 Manual: Grade 2 and 3 PA and AP mobilizations to reduce pain and improve mobility of  the left knee.  Significant improvement in flexion noted with manual therapy.  Trigger point release to IT band.  Reviewed self trigger point release to IT band.  Self patellar mobilization. More emphasis on flexion ROM today.  There-ex:  Nu-step L5 5 min  SLR 3x12 each leg  LAQ 3x12   Neuro-re-ed:  Heel toe rock 3x12  Slow march with UE support 2x10      9/8 Manual: Grade 2 and 3 PA and AP mobilizations to reduce pain and improve mobility of the left knee.  Significant improvement in flexion noted with manual therapy.  Trigger point release to IT band.  Reviewed self trigger point release  to IT band.  Self patellar mobilization. More emphasis on flexion ROM today.  There-ex:  Nu-step L5 5 min  SLR 3x12 each leg  Neuro-re-ed Heel/rock 3x10  Bridge 3x12   There-act:  Step up 6 inch 2x10 each leg  Step down 4 inch 2x10 each leg    8/29 Manual: Grade 2 and 3 PA and AP mobilizations to reduce pain and improve mobility of the left knee.  Significant improvement in flexion noted with manual therapy.  Trigger point release to IT band.  Reviewed self trigger point release to IT band.  Self patellar mobilization. More emphasis on flexion ROM today.  There-ex:  SAQ 3x12 each leg L 1.5  R 3 lbs 3 x 12  SLR 3 x 10 left leg 1 pound weight   Neuro re education : Heel toe rock 3 x 12  Therapeutic activity Step up right leg 6 inch 3 x 10 Left leg 4 inch 3 x 10  Stepdown Right leg 4 inch 2 x 10 Left leg 4 inch 2 x 10  8/22 Manual: Grade 2 and 3 PA and AP mobilizations to reduce pain and improve mobility of the left knee.  Significant improvement in flexion noted with manual therapy.  Trigger point release to IT band.  Reviewed self trigger point release to IT band.  Self patellar mobilization. More emphasis on flexion ROM today.  There-ex:  SAQ 3x12 each leg L 1.5  R 3 lbs   SLR 3 x 10 left leg Bilateral hip abduction green band 3 x 10 each leg   Neuro-re-ed:  Heel/toe rock 3x12  Slow March 3x10     8/19 Manual: Grade 2 and 3 PA and AP mobilizations to reduce pain and improve mobility of the left knee.  Significant improvement in flexion noted with manual therapy.  Trigger point release to IT band.  Reviewed self trigger point release to IT band.  Self patellar mobilization. More emphasis on flexion ROM today.  There-ex:  SAQ 3x12 each leg L 1.5  R 3 lbs   Neuro-re-ed:  Heel/toe rock 3x12  Cable walk 3x12 15 lbs  Slow March 3x10      8/15 Manual: Grade 2 and 3 PA and AP mobilizations to reduce pain and improve mobility of the left knee.  Significant  improvement in flexion noted with manual therapy.  Trigger point release to IT band.  Reviewed self trigger point release to IT band.  Self patellar mobilization. More emphasis on flexion ROM today.  Therapeutic activity: Step up 2 x 10 4 inch each leg Lateral step up 2 x 10 4 inch each leg Step down 4 inch right leg 2 inch left leg 2 x 10  Neuro-re-ed:  Step onto air-ex 2x10 each leg   8/13 Manual: Grade 2 and 3 PA and  AP mobilizations to reduce pain and improve mobility of the left knee.  Significant improvement in flexion noted with manual therapy.  Trigger point release to IT band.  Reviewed self trigger point release to IT band.  Self patellar mobilization. More emphasis on flexion ROM today.  Therapeutic activity: Step up 2 x 10 4 inch each leg Lateral step up 2 x 10 4 inch each leg Step down 4 inch right leg 2 inch left leg 2 x 10  Neuromuscular reeducation: Cable walk 15 pounds x 10  There ex: Leg press 70 pounds 3 x 12   8/5: STM/IASTM to ITB and hip flexors to R hip Thomas stretch PROM L knee Sit to stands x10 Eccentric fwd slides from floor level Eccentric step downs 2 step Modified step down from stair Fwd step ups 2x10ea onto airex Partial lunges (knee over toe)  Cybex leg press 60# (seat 7, incline 3) 3x10      PATIENT EDUCATION:  Education details: Anatomy of condition, POC, HEP, exercise form/rationale Person educated: Patient Education method: Explanation, Demonstration, Tactile cues, Verbal cues, and Handouts Education comprehension: verbalized understanding, returned demonstration, verbal cues required, tactile cues required, and needs further education  HOME EXERCISE PROGRAM: Access Code: [email or web address] URL: [email or web address]  ASSESSMENT:  CLINICAL IMPRESSION: Nt continues to have improved motion in her knee. It has improved her ability to ambulate without an antalgic gait which has helped her right hip. We were able to continue  working on stair training today> She tolerated well. She is having improved ability to go up stairs with her left LE> her   CLINICAL DECISION MAKING: Stable/uncomplicated  EVALUATION COMPLEXITY: Low   GOALS: Goals reviewed with patient? Yes  SHORT TERM GOALS: Target date: 01/21/2024    Sit<>stand without use of UEs Baseline: Goal status: IN PROGRESS (improving, but slight weight shift to RLE present) 8/5  2.  Standing hip hip to neutral extension Baseline:  Goal status: achieved 6/6    LONG TERM GOALS: Target date: 02/18/2024    Stand as lon as necessary for work with minimal discomfort Baseline:  Goal status: improving 8/5  2.  LEFS to improve by MDC x2 Baseline:  Goal status: progressing 6/8  3.  Hip abd strength to 80% of opposite LE Baseline:  Goal status: improving 8/5 4.  Easily don socks and shoes independently Baseline:  Goal status: IN PROGRESS 8/5  5.  Begin training for return to pickle ball Baseline:  Goal status: has not returned 8/5     PLAN:  PT FREQUENCY: 1-2x/week  PT DURATION:8 weeks  PLANNED INTERVENTIONS: 97164- PT Re-evaluation, 97750- Physical Performance Testing, 97110-Therapeutic exercises, 97530- Therapeutic activity, 97112- Neuromuscular re-education, 97535- Self Care, 02859- Manual therapy, [phone]- Gait training, [phone]- Aquatic Therapy, Patient/Family education, Balance training, Stair training, Taping, Dry Needling, Joint mobilization, Spinal mobilization, Scar mobilization, Cryotherapy, and Moist heat.  PLAN FOR NEXT SESSION: continue flexibility and strength to reduce post pelvic tilt.  Alm Don PT DPT  01/30/24 10:32 AM    Referring diagnosis?  S03.358 (ICD-10-CM) - Status post total replacement of right hip    Treatment diagnosis? (if different than referring diagnosis) M25.551 What was this (referring dx) caused by? [x]  Surgery []  Fall []  Ongoing issue []  Arthritis []  Other: ____________  Laterality: [x]   Rt []  Lt []  Both  Check all possible CPT codes:  *CHOOSE 10 OR LESS*    See Planned Interventions listed in the Plan section of the Evaluation.

## 2024-01-30 ENCOUNTER — Encounter (HOSPITAL_BASED_OUTPATIENT_CLINIC_OR_DEPARTMENT_OTHER): Payer: Self-pay | Admitting: Physical Therapy

## 2024-02-03 ENCOUNTER — Encounter (HOSPITAL_BASED_OUTPATIENT_CLINIC_OR_DEPARTMENT_OTHER): Payer: Self-pay | Admitting: Physical Therapy

## 2024-02-03 ENCOUNTER — Ambulatory Visit (HOSPITAL_BASED_OUTPATIENT_CLINIC_OR_DEPARTMENT_OTHER): Admitting: Physical Therapy

## 2024-02-03 DIAGNOSIS — M6281 Muscle weakness (generalized): Secondary | ICD-10-CM

## 2024-02-03 DIAGNOSIS — M25662 Stiffness of left knee, not elsewhere classified: Secondary | ICD-10-CM | POA: Diagnosis not present

## 2024-02-03 DIAGNOSIS — R262 Difficulty in walking, not elsewhere classified: Secondary | ICD-10-CM

## 2024-02-03 DIAGNOSIS — M25551 Pain in right hip: Secondary | ICD-10-CM

## 2024-02-03 DIAGNOSIS — R2689 Other abnormalities of gait and mobility: Secondary | ICD-10-CM | POA: Diagnosis not present

## 2024-02-03 DIAGNOSIS — G8929 Other chronic pain: Secondary | ICD-10-CM | POA: Diagnosis not present

## 2024-02-03 DIAGNOSIS — M25562 Pain in left knee: Secondary | ICD-10-CM | POA: Diagnosis not present

## 2024-02-03 NOTE — Therapy (Signed)
 OUTPATIENT PHYSICAL THERAPY  Progress Note Reporting Period 10/06/2023 to 12/03/2023  See note below for Objective Data and Assessment of Progress/Goals.        Patient Name: Jaime Rose MRN: [id number] DOB:Jul 06, 1950, 73 y.o., female Today's Date: 02/04/2024  END OF SESSION:  PT End of Session - 02/03/24 1302     Visit Number 27    Number of Visits 34    Date for Recertification  02/04/24    Authorization Type 22 approved from 10/14/2023    PT Start Time 1230    PT Stop Time 1310    PT Time Calculation (min) 40 min    Activity Tolerance Patient tolerated treatment well    Behavior During Therapy [facility] for tasks assessed/performed                    Past Medical History:  Diagnosis Date   ALLERGIC RHINITIS    Allergy 1985   seasonal allergies   Arthritis    CHICKENPOX, HX OF 12/21/2009   Qualifier: Diagnosis of  By: Inocencio MD, [place] DELENA    Heart murmur    MR   Hyperthyroidism    normal TFTs 11/2009 , dx 10/2011   Pre-diabetes    Varicose vein    s/p sclerosis tx summer 2013   Past Surgical History:  Procedure Laterality Date   ENDOVENOUS ABLATION SAPHENOUS VEIN W/ LASER  04/11/2012   endovenous laser ablation right greater saphenous vein and stab phlebectomy  right leg  by Krystal Doing MD   NO PAST SURGERIES     TOTAL HIP ARTHROPLASTY Right 07/26/2023   Procedure: ARTHROPLASTY, HIP, TOTAL, ANTERIOR APPROACH;  Surgeon: Vernetta Lonni GRADE, MD;  Location: [facility];  Service: Orthopedics;  Laterality: Right;   Patient Active Problem List   Diagnosis Date Noted   Status post total replacement of right hip 07/26/2023   Hyperlipidemia 07/17/2023   Elevated coronary artery calcium  score 07/17/2023   Preoperative clearance 07/17/2023   Mitral regurgitation, moderate-possibly severe 07/11/2023   Vitamin D  deficiency 05/23/2023   Foot pain, left 06/02/2021   Ptosis of right eyelid 04/12/2020   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 12/29/2018   Osteopenia  01/10/2017   Carotid artery stenosis, asymptomatic, bilateral 12/21/2016   Prediabetes 12/21/2016   Hypothyroidism 03/20/2012   Varicose veins of bilateral lower extremities with other complications 12/21/2011   Allergic rhinitis 12/21/2009   Cardiac murmur 12/21/2009       REFERRING PROVIDER: Lonni Vernetta, MD  REFERRING DIAG:  [phone] (ICD-10-CM) - Status post total replacement of right hip     S/p Rt THA Rationale for Evaluation and Treatment: Rehabilitation  THERAPY DIAG:  Pain in right hip  Difficulty in walking, not elsewhere classified  Chronic pain of left knee  Stiffness of left knee, not elsewhere classified  Muscle weakness (generalized)  Other abnormalities of gait and mobility  ONSET DATE: DOS 07/26/23   SUBJECTIVE:  SUBJECTIVE STATEMENT: The patient was sore yesterday but she is doing well today. She reports overall it is improving.    PERTINENT HISTORY:  none  PAIN:  Are you having pain? Yes: NPRS scale: knee 3/10 hip 0/10 Pain location: Rt laeral hip Pain description: tight, tender Aggravating factors: walking Relieving factors: rest  PRECAUTIONS:  Anterior hip  RED FLAGS: None   WEIGHT BEARING RESTRICTIONS:  No  FALLS:  Has patient fallen in last 6 months? No  LIVING ENVIRONMENT: 4 steps to get into home, bedroom on main floor, has a second story.  OCCUPATION:  Next week to return to work   PLOF:  Independent  PATIENT GOALS:  Don my own sock and shoes. Preserve left knee to avoid TKA, play pickle ball   OBJECTIVE:  Note: Objective measures were completed at Evaluation unless otherwise noted.  PATIENT SURVEYS:  LEFS 28/80 LEFS 7/8 47/80  COGNITIVE STATUS: Within functional limits for tasks  assessed   SENSATION: WFL  EDEMA:  Yes: mild  POSTURE:  Lacking full hip extension bilaterally  GAIT: EVAL: antalgic lacking hip extension through stance phase/toe off   Body Part #1 Hip  PALPATION: Eval- edema and warmth  LOWER EXTREMITY ROM:     Active  Right eval Left eval Rt Lt  Standing 5/19 Right  6/6  Left 6/6 Left 8/5 Left 8/26 left  Hip flexion    105      Hip extension -12 -6 -7/-2       Hip abduction          Hip adduction          Hip internal rotation          Hip external rotation    40  95 98 101  Knee flexion          Knee extension 0 -8 -5  -5 -5 With poor -10   (Blank rows = not tested)     Gross strength   Left/ right  flexion 4+/4+ 8/26 flexion 5/5 Hip abduction 4+/4  8/26 5/4+ Knee extension 4+/4    8/26 5/4+                                                                                                                       TREATMENT DATE:  10/6   Manual: Grade 2 and 3 PA and AP mobilizations to reduce pain and improve mobility of the left knee.  Significant improvement in flexion noted with manual therapy.  Trigger point release to IT band.  Reviewed self trigger point release to IT band.  Self patellar mobilization. More emphasis on flexion ROM today.   There-ex:  Nu-step L5 5 min  SLR 2x12 each leg  LAQ 3x12  SAQ 2x20 2 lbs R L 1.5 bs   Step up  4 inch 2x10 fwd  4 inch lateral 2x10    10/1 Manual: Grade 2 and 3 PA and AP mobilizations to reduce pain and improve mobility of  the left knee.  Significant improvement in flexion noted with manual therapy.  Trigger point release to IT band.  Reviewed self trigger point release to IT band.  Self patellar mobilization. More emphasis on flexion ROM today.  There-ex:  There-ex:  Nu-step L5 5 min  SLR 2x12 each leg  LAQ 3x12  SAQ 2x20 2 lbs R L 1.5 bs   Step up  4 inch 2x10 fwd  4 inch lateral 2x10     09/25 Manual: Grade 2 and 3 PA and AP mobilizations to reduce  pain and improve mobility of the left knee.  Significant improvement in flexion noted with manual therapy.  Trigger point release to IT band.  Reviewed self trigger point release to IT band.  Self patellar mobilization. More emphasis on flexion ROM today.  There-ex:  Nu-step L5 5 min  SLR 3x12 each leg  LAQ 3x12   Neuro-re-ed:  Heel toe rock 3x12  Slow march with UE support 2x10      9/8 Manual: Grade 2 and 3 PA and AP mobilizations to reduce pain and improve mobility of the left knee.  Significant improvement in flexion noted with manual therapy.  Trigger point release to IT band.  Reviewed self trigger point release to IT band.  Self patellar mobilization. More emphasis on flexion ROM today.  There-ex:  Nu-step L5 5 min  SLR 3x12 each leg  Neuro-re-ed Heel/rock 3x10  Bridge 3x12   There-act:  Step up 6 inch 2x10 each leg  Step down 4 inch 2x10 each leg    8/29 Manual: Grade 2 and 3 PA and AP mobilizations to reduce pain and improve mobility of the left knee.  Significant improvement in flexion noted with manual therapy.  Trigger point release to IT band.  Reviewed self trigger point release to IT band.  Self patellar mobilization. More emphasis on flexion ROM today.  There-ex:  SAQ 3x12 each leg L 1.5  R 3 lbs 3 x 12  SLR 3 x 10 left leg 1 pound weight   Neuro re education : Heel toe rock 3 x 12  Therapeutic activity Step up right leg 6 inch 3 x 10 Left leg 4 inch 3 x 10  Stepdown Right leg 4 inch 2 x 10 Left leg 4 inch 2 x 10  8/22 Manual: Grade 2 and 3 PA and AP mobilizations to reduce pain and improve mobility of the left knee.  Significant improvement in flexion noted with manual therapy.  Trigger point release to IT band.  Reviewed self trigger point release to IT band.  Self patellar mobilization. More emphasis on flexion ROM today.  There-ex:  SAQ 3x12 each leg L 1.5  R 3 lbs   SLR 3 x 10 left leg Bilateral hip abduction green band 3 x 10 each  leg   Neuro-re-ed:  Heel/toe rock 3x12  Slow March 3x10     8/19 Manual: Grade 2 and 3 PA and AP mobilizations to reduce pain and improve mobility of the left knee.  Significant improvement in flexion noted with manual therapy.  Trigger point release to IT band.  Reviewed self trigger point release to IT band.  Self patellar mobilization. More emphasis on flexion ROM today.  There-ex:  SAQ 3x12 each leg L 1.5  R 3 lbs   Neuro-re-ed:  Heel/toe rock 3x12  Cable walk 3x12 15 lbs  Slow March 3x10          PATIENT EDUCATION:  Education details: Teacher, music  of condition, POC, HEP, exercise form/rationale Person educated: Patient Education method: Explanation, Demonstration, Tactile cues, Verbal cues, and Handouts Education comprehension: verbalized understanding, returned demonstration, verbal cues required, tactile cues required, and needs further education  HOME EXERCISE PROGRAM: Access Code: [email or web address] URL: [email or web address]  ASSESSMENT:  CLINICAL IMPRESSION: The patient tolerated treatment well. She maintained her flexion from last visit. We continue to work on stapes. We kept her weights consistent 2nd to having a session of short wave therapy today.  CLINICAL DECISION MAKING: Stable/uncomplicated  EVALUATION COMPLEXITY: Low   GOALS: Goals reviewed with patient? Yes  SHORT TERM GOALS: Target date: 01/21/2024    Sit<>stand without use of UEs Baseline: Goal status: IN PROGRESS (improving, but slight weight shift to RLE present) 8/5  2.  Standing hip hip to neutral extension Baseline:  Goal status: achieved 6/6    LONG TERM GOALS: Target date: 02/18/2024    Stand as lon as necessary for work with minimal discomfort Baseline:  Goal status: improving 8/5  2.  LEFS to improve by MDC x2 Baseline:  Goal status: progressing 6/8  3.  Hip abd strength to 80% of opposite LE Baseline:  Goal status: improving 8/5 4.  Easily don socks and  shoes independently Baseline:  Goal status: IN PROGRESS 8/5  5.  Begin training for return to pickle ball Baseline:  Goal status: has not returned 8/5     PLAN:  PT FREQUENCY: 1-2x/week  PT DURATION:8 weeks  PLANNED INTERVENTIONS: 97164- PT Re-evaluation, 97750- Physical Performance Testing, 97110-Therapeutic exercises, 97530- Therapeutic activity, 97112- Neuromuscular re-education, 97535- Self Care, [id number]- Manual therapy, [phone]- Gait training, [phone]- Aquatic Therapy, Patient/Family education, Balance training, Stair training, Taping, Dry Needling, Joint mobilization, Spinal mobilization, Scar mobilization, Cryotherapy, and Moist heat.  PLAN FOR NEXT SESSION: continue flexibility and strength to reduce post pelvic tilt.  Alm Don PT DPT  02/04/24 11:58 AM    Referring diagnosis?  S03.358 (ICD-10-CM) - Status post total replacement of right hip    Treatment diagnosis? (if different than referring diagnosis) M25.551 What was this (referring dx) caused by? [x]  Surgery []  Fall []  Ongoing issue []  Arthritis []  Other: ____________  Laterality: [x]  Rt []  Lt []  Both  Check all possible CPT codes:  *CHOOSE 10 OR LESS*    See Planned Interventions listed in the Plan section of the Evaluation.

## 2024-02-04 ENCOUNTER — Encounter (HOSPITAL_BASED_OUTPATIENT_CLINIC_OR_DEPARTMENT_OTHER): Payer: Self-pay | Admitting: Physical Therapy

## 2024-02-06 ENCOUNTER — Encounter (HOSPITAL_BASED_OUTPATIENT_CLINIC_OR_DEPARTMENT_OTHER): Payer: Self-pay | Admitting: Physical Therapy

## 2024-02-06 ENCOUNTER — Ambulatory Visit (HOSPITAL_BASED_OUTPATIENT_CLINIC_OR_DEPARTMENT_OTHER): Admitting: Physical Therapy

## 2024-02-06 DIAGNOSIS — R262 Difficulty in walking, not elsewhere classified: Secondary | ICD-10-CM

## 2024-02-06 DIAGNOSIS — G8929 Other chronic pain: Secondary | ICD-10-CM | POA: Diagnosis not present

## 2024-02-06 DIAGNOSIS — M25562 Pain in left knee: Secondary | ICD-10-CM | POA: Diagnosis not present

## 2024-02-06 DIAGNOSIS — M6281 Muscle weakness (generalized): Secondary | ICD-10-CM | POA: Diagnosis not present

## 2024-02-06 DIAGNOSIS — R2689 Other abnormalities of gait and mobility: Secondary | ICD-10-CM | POA: Diagnosis not present

## 2024-02-06 DIAGNOSIS — M25551 Pain in right hip: Secondary | ICD-10-CM | POA: Diagnosis not present

## 2024-02-06 DIAGNOSIS — M25662 Stiffness of left knee, not elsewhere classified: Secondary | ICD-10-CM | POA: Diagnosis not present

## 2024-02-06 NOTE — Therapy (Signed)
 OUTPATIENT PHYSICAL THERAPY  Progress Note Reporting Period 10/06/2023 to 12/03/2023  See note below for Objective Data and Assessment of Progress/Goals.        Patient Name: Jaime Rose MRN: [id number] DOB:30-Dec-1950, 73 y.o., female Today's Date: 02/06/2024  END OF SESSION:  PT End of Session - 02/06/24 1342     Visit Number 28    Number of Visits 34    Date for Recertification  02/04/24    Authorization Type 22 approved from 10/14/2023    PT Start Time 1315    PT Stop Time 1358    PT Time Calculation (min) 43 min    Activity Tolerance Patient tolerated treatment well    Behavior During Therapy [facility] for tasks assessed/performed                     Past Medical History:  Diagnosis Date   ALLERGIC RHINITIS    Allergy 1985   seasonal allergies   Arthritis    CHICKENPOX, HX OF 12/21/2009   Qualifier: Diagnosis of  By: Inocencio MD, [place] DELENA    Heart murmur    MR   Hyperthyroidism    normal TFTs 11/2009 , dx 10/2011   Pre-diabetes    Varicose vein    s/p sclerosis tx summer 2013   Past Surgical History:  Procedure Laterality Date   ENDOVENOUS ABLATION SAPHENOUS VEIN W/ LASER  04/11/2012   endovenous laser ablation right greater saphenous vein and stab phlebectomy  right leg  by Krystal Doing MD   NO PAST SURGERIES     TOTAL HIP ARTHROPLASTY Right 07/26/2023   Procedure: ARTHROPLASTY, HIP, TOTAL, ANTERIOR APPROACH;  Surgeon: Vernetta Lonni GRADE, MD;  Location: [facility];  Service: Orthopedics;  Laterality: Right;   Patient Active Problem List   Diagnosis Date Noted   Status post total replacement of right hip 07/26/2023   Hyperlipidemia 07/17/2023   Elevated coronary artery calcium  score 07/17/2023   Preoperative clearance 07/17/2023   Mitral regurgitation, moderate-possibly severe 07/11/2023   Vitamin D  deficiency 05/23/2023   Foot pain, left 06/02/2021   Ptosis of right eyelid 04/12/2020   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 12/29/2018   Osteopenia  01/10/2017   Carotid artery stenosis, asymptomatic, bilateral 12/21/2016   Prediabetes 12/21/2016   Hypothyroidism 03/20/2012   Varicose veins of bilateral lower extremities with other complications 12/21/2011   Allergic rhinitis 12/21/2009   Cardiac murmur 12/21/2009       REFERRING PROVIDER: Lonni Vernetta, MD  REFERRING DIAG:  [phone] (ICD-10-CM) - Status post total replacement of right hip     S/p Rt THA Rationale for Evaluation and Treatment: Rehabilitation  THERAPY DIAG:  No diagnosis found.  ONSET DATE: DOS 07/26/23   SUBJECTIVE:  SUBJECTIVE STATEMENT: The patient was sore yesterday but she is doing well today. She reports overall it is improving.    PERTINENT HISTORY:  none  PAIN:  Are you having pain? Yes: NPRS scale: knee 3/10 hip 0/10 Pain location: Rt laeral hip Pain description: tight, tender Aggravating factors: walking Relieving factors: rest  PRECAUTIONS:  Anterior hip  RED FLAGS: None   WEIGHT BEARING RESTRICTIONS:  No  FALLS:  Has patient fallen in last 6 months? No  LIVING ENVIRONMENT: 4 steps to get into home, bedroom on main floor, has a second story.  OCCUPATION:  Next week to return to work   PLOF:  Independent  PATIENT GOALS:  Don my own sock and shoes. Preserve left knee to avoid TKA, play pickle ball   OBJECTIVE:  Note: Objective measures were completed at Evaluation unless otherwise noted.  PATIENT SURVEYS:  LEFS 28/80 LEFS 7/8 47/80  COGNITIVE STATUS: Within functional limits for tasks assessed   SENSATION: WFL  EDEMA:  Yes: mild  POSTURE:  Lacking full hip extension bilaterally  GAIT: EVAL: antalgic lacking hip extension through stance phase/toe off   Body Part #1 Hip  PALPATION: Eval- edema and  warmth  LOWER EXTREMITY ROM:     Active  Right eval Left eval Rt Lt  Standing 5/19 Right  6/6  Left 6/6 Left 8/5 Left 8/26 left  Hip flexion    105      Hip extension -12 -6 -7/-2       Hip abduction          Hip adduction          Hip internal rotation          Hip external rotation    40  95 98 101  Knee flexion          Knee extension 0 -8 -5  -5 -5 With poor -10   (Blank rows = not tested)     Gross strength   Left/ right  flexion 4+/4+ 8/26 flexion 5/5 Hip abduction 4+/4  8/26 5/4+ Knee extension 4+/4    8/26 5/4+                                                                                                                       TREATMENT DATE:  10/9 Manual: Grade 2 and 3 PA and AP mobilizations to reduce pain and improve mobility of the left knee.  Significant improvement in flexion noted with manual therapy.  Trigger point release to IT band.  Reviewed self trigger point release to IT band.  Self patellar mobilization. More emphasis on flexion ROM today.  There-ex:  Nu-step L5 5 min  SLR 2x12 each leg  LAQ 3x12  SAQ 3x12 Quad set 3x12  Standing weight shift 3x12   10/6   Manual: Grade 2 and 3 PA and AP mobilizations to reduce pain and improve mobility of the left knee.  Significant improvement in flexion noted with manual therapy.  Trigger  point release to IT band.  Reviewed self trigger point release to IT band.  Self patellar mobilization. More emphasis on flexion ROM today.   There-ex:  Nu-step L5 5 min  SLR 2x12 each leg  LAQ 3x12  SAQ 2x20 2 lbs R L 1.5 bs   Step up  4 inch 2x10 fwd  4 inch lateral 2x10    10/1 Manual: Grade 2 and 3 PA and AP mobilizations to reduce pain and improve mobility of the left knee.  Significant improvement in flexion noted with manual therapy.  Trigger point release to IT band.  Reviewed self trigger point release to IT band.  Self patellar mobilization. More emphasis on flexion ROM today.  There-ex:   There-ex:  Nu-step L5 5 min  SLR 2x12 each leg  LAQ 3x12  SAQ 2x20 2 lbs R L 1.5 bs   Step up  4 inch 2x10 fwd  4 inch lateral 2x10     09/25 Manual: Grade 2 and 3 PA and AP mobilizations to reduce pain and improve mobility of the left knee.  Significant improvement in flexion noted with manual therapy.  Trigger point release to IT band.  Reviewed self trigger point release to IT band.  Self patellar mobilization. More emphasis on flexion ROM today.  There-ex:  Nu-step L5 5 min  SLR 3x12 each leg  LAQ 3x12   Neuro-re-ed:  Heel toe rock 3x12  Slow march with UE support 2x10      9/8 Manual: Grade 2 and 3 PA and AP mobilizations to reduce pain and improve mobility of the left knee.  Significant improvement in flexion noted with manual therapy.  Trigger point release to IT band.  Reviewed self trigger point release to IT band.  Self patellar mobilization. More emphasis on flexion ROM today.  There-ex:  Nu-step L5 5 min  SLR 3x12 each leg  Neuro-re-ed Heel/rock 3x10  Bridge 3x12   There-act:  Step up 6 inch 2x10 each leg  Step down 4 inch 2x10 each leg    8/29 Manual: Grade 2 and 3 PA and AP mobilizations to reduce pain and improve mobility of the left knee.  Significant improvement in flexion noted with manual therapy.  Trigger point release to IT band.  Reviewed self trigger point release to IT band.  Self patellar mobilization. More emphasis on flexion ROM today.  There-ex:  SAQ 3x12 each leg L 1.5  R 3 lbs 3 x 12  SLR 3 x 10 left leg 1 pound weight   Neuro re education : Heel toe rock 3 x 12  Therapeutic activity Step up right leg 6 inch 3 x 10 Left leg 4 inch 3 x 10  Stepdown Right leg 4 inch 2 x 10 Left leg 4 inch 2 x 10  8/22 Manual: Grade 2 and 3 PA and AP mobilizations to reduce pain and improve mobility of the left knee.  Significant improvement in flexion noted with manual therapy.  Trigger point release to IT band.  Reviewed self trigger  point release to IT band.  Self patellar mobilization. More emphasis on flexion ROM today.  There-ex:  SAQ 3x12 each leg L 1.5  R 3 lbs   SLR 3 x 10 left leg Bilateral hip abduction green band 3 x 10 each leg   Neuro-re-ed:  Heel/toe rock 3x12  Slow March 3x10     8/19 Manual: Grade 2 and 3 PA and AP mobilizations to reduce pain and improve mobility of  the left knee.  Significant improvement in flexion noted with manual therapy.  Trigger point release to IT band.  Reviewed self trigger point release to IT band.  Self patellar mobilization. More emphasis on flexion ROM today.  There-ex:  SAQ 3x12 each leg L 1.5  R 3 lbs   Neuro-re-ed:  Heel/toe rock 3x12  Cable walk 3x12 15 lbs  Slow March 3x10          PATIENT EDUCATION:  Education details: Teacher, music of condition, POC, HEP, exercise form/rationale Person educated: Patient Education method: Explanation, Demonstration, Tactile cues, Verbal cues, and Handouts Education comprehension: verbalized understanding, returned demonstration, verbal cues required, tactile cues required, and needs further education  HOME EXERCISE PROGRAM: Access Code: [email or web address] URL: [email or web address]  ASSESSMENT:  CLINICAL IMPRESSION: The patient felt more sore and stiff today. She thinks it could be the leg press. She was advised to hold at this time on the leg press. We kept her with basic exercises today. Therapy will continue to progress as tolerated.   CLINICAL DECISION MAKING: Stable/uncomplicated  EVALUATION COMPLEXITY: Low   GOALS: Goals reviewed with patient? Yes  SHORT TERM GOALS: Target date: 01/21/2024    Sit<>stand without use of UEs Baseline: Goal status: IN PROGRESS (improving, but slight weight shift to RLE present) 8/5  2.  Standing hip hip to neutral extension Baseline:  Goal status: achieved 6/6    LONG TERM GOALS: Target date: 02/18/2024    Stand as lon as necessary for work with minimal  discomfort Baseline:  Goal status: improving 8/5  2.  LEFS to improve by MDC x2 Baseline:  Goal status: progressing 6/8  3.  Hip abd strength to 80% of opposite LE Baseline:  Goal status: improving 8/5 4.  Easily don socks and shoes independently Baseline:  Goal status: IN PROGRESS 8/5  5.  Begin training for return to pickle ball Baseline:  Goal status: has not returned 8/5     PLAN:  PT FREQUENCY: 1-2x/week  PT DURATION:8 weeks  PLANNED INTERVENTIONS: 97164- PT Re-evaluation, 97750- Physical Performance Testing, 97110-Therapeutic exercises, 97530- Therapeutic activity, 97112- Neuromuscular re-education, 97535- Self Care, 02859- Manual therapy, [phone]- Gait training, [phone]- Aquatic Therapy, Patient/Family education, Balance training, Stair training, Taping, Dry Needling, Joint mobilization, Spinal mobilization, Scar mobilization, Cryotherapy, and Moist heat.  PLAN FOR NEXT SESSION: continue flexibility and strength to reduce post pelvic tilt.  Alm Don PT DPT  02/06/24 1:43 PM    Referring diagnosis?  S03.358 (ICD-10-CM) - Status post total replacement of right hip    Treatment diagnosis? (if different than referring diagnosis) M25.551 What was this (referring dx) caused by? [x]  Surgery []  Fall []  Ongoing issue []  Arthritis []  Other: ____________  Laterality: [x]  Rt []  Lt []  Both  Check all possible CPT codes:  *CHOOSE 10 OR LESS*    See Planned Interventions listed in the Plan section of the Evaluation.

## 2024-02-10 ENCOUNTER — Encounter (HOSPITAL_BASED_OUTPATIENT_CLINIC_OR_DEPARTMENT_OTHER): Payer: Self-pay | Admitting: Physical Therapy

## 2024-02-10 ENCOUNTER — Ambulatory Visit (HOSPITAL_BASED_OUTPATIENT_CLINIC_OR_DEPARTMENT_OTHER): Admitting: Physical Therapy

## 2024-02-10 DIAGNOSIS — M25662 Stiffness of left knee, not elsewhere classified: Secondary | ICD-10-CM | POA: Diagnosis not present

## 2024-02-10 DIAGNOSIS — M25551 Pain in right hip: Secondary | ICD-10-CM

## 2024-02-10 DIAGNOSIS — R262 Difficulty in walking, not elsewhere classified: Secondary | ICD-10-CM | POA: Diagnosis not present

## 2024-02-10 DIAGNOSIS — G8929 Other chronic pain: Secondary | ICD-10-CM | POA: Diagnosis not present

## 2024-02-10 DIAGNOSIS — M6281 Muscle weakness (generalized): Secondary | ICD-10-CM | POA: Diagnosis not present

## 2024-02-10 DIAGNOSIS — M25562 Pain in left knee: Secondary | ICD-10-CM | POA: Diagnosis not present

## 2024-02-10 DIAGNOSIS — R2689 Other abnormalities of gait and mobility: Secondary | ICD-10-CM | POA: Diagnosis not present

## 2024-02-10 NOTE — Therapy (Signed)
 OUTPATIENT PHYSICAL THERAPY  Progress Note Reporting Period 10/06/2023 to 12/03/2023  See note below for Objective Data and Assessment of Progress/Goals.        Patient Name: Jaime Rose MRN: [id number] DOB:1951-02-28, 73 y.o., female Today's Date: 02/11/2024  END OF SESSION:  PT End of Session - 02/10/24 1209     Visit Number 29    Number of Visits 41    Date for Recertification  04/06/24    Authorization Type 18 visits app 12-30-2023- 02-29-2024    PT Start Time 0930    PT Stop Time 1012    PT Time Calculation (min) 42 min    Activity Tolerance Patient tolerated treatment well    Behavior During Therapy [facility] for tasks assessed/performed                      Past Medical History:  Diagnosis Date   ALLERGIC RHINITIS    Allergy 1985   seasonal allergies   Arthritis    CHICKENPOX, HX OF 12/21/2009   Qualifier: Diagnosis of  By: Inocencio MD, [place] DELENA    Heart murmur    MR   Hyperthyroidism    normal TFTs 11/2009 , dx 10/2011   Pre-diabetes    Varicose vein    s/p sclerosis tx summer 2013   Past Surgical History:  Procedure Laterality Date   ENDOVENOUS ABLATION SAPHENOUS VEIN W/ LASER  04/11/2012   endovenous laser ablation right greater saphenous vein and stab phlebectomy  right leg  by Krystal Doing MD   NO PAST SURGERIES     TOTAL HIP ARTHROPLASTY Right 07/26/2023   Procedure: ARTHROPLASTY, HIP, TOTAL, ANTERIOR APPROACH;  Surgeon: Vernetta Lonni GRADE, MD;  Location: [facility];  Service: Orthopedics;  Laterality: Right;   Patient Active Problem List   Diagnosis Date Noted   Status post total replacement of right hip 07/26/2023   Hyperlipidemia 07/17/2023   Elevated coronary artery calcium  score 07/17/2023   Preoperative clearance 07/17/2023   Mitral regurgitation, moderate-possibly severe 07/11/2023   Vitamin D  deficiency 05/23/2023   Foot pain, left 06/02/2021   Ptosis of right eyelid 04/12/2020   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 12/29/2018    Osteopenia 01/10/2017   Carotid artery stenosis, asymptomatic, bilateral 12/21/2016   Prediabetes 12/21/2016   Hypothyroidism 03/20/2012   Varicose veins of bilateral lower extremities with other complications 12/21/2011   Allergic rhinitis 12/21/2009   Cardiac murmur 12/21/2009       REFERRING PROVIDER: Lonni Vernetta, MD  REFERRING DIAG:  [phone] (ICD-10-CM) - Status post total replacement of right hip     S/p Rt THA Rationale for Evaluation and Treatment: Rehabilitation  THERAPY DIAG:  Pain in right hip  Difficulty in walking, not elsewhere classified  Chronic pain of left knee  ONSET DATE: DOS 07/26/23   SUBJECTIVE:  SUBJECTIVE STATEMENT: The patient went to [facility] on Saturday her knee got sore. It was better yesterday nut stiff today.    PERTINENT HISTORY:  none  PAIN:  Are you having pain? Yes: NPRS scale: knee 3/10 hip 0/10 Pain location: Rt laeral hip Pain description: tight, tender Aggravating factors: walking Relieving factors: rest  PRECAUTIONS:  Anterior hip  RED FLAGS: None   WEIGHT BEARING RESTRICTIONS:  No  FALLS:  Has patient fallen in last 6 months? No  LIVING ENVIRONMENT: 4 steps to get into home, bedroom on main floor, has a second story.  OCCUPATION:  Next week to return to work   PLOF:  Independent  PATIENT GOALS:  Don my own sock and shoes. Preserve left knee to avoid TKA, play pickle ball   OBJECTIVE:  Note: Objective measures were completed at Evaluation unless otherwise noted.  PATIENT SURVEYS:  LEFS 28/80 LEFS 7/8 47/80  COGNITIVE STATUS: Within functional limits for tasks assessed   SENSATION: WFL  EDEMA:  Yes: mild  POSTURE:  Lacking full hip extension bilaterally  GAIT: EVAL: antalgic lacking hip extension  through stance phase/toe off   Body Part #1 Hip  PALPATION: Eval- edema and warmth  LOWER EXTREMITY ROM:     Active  Right eval Left eval Rt Lt  Standing 5/19 Right  6/6  Left 6/6 Left 8/5 Left 8/26 left Left  10/13  Hip flexion    105       Hip extension -12 -6 -7/-2        Hip abduction           Hip adduction           Hip internal rotation           Hip external rotation    40  95 98 101 103  Knee flexion           Knee extension 0 -8 -5  -5 -5 With poor -10 -8   (Blank rows = not tested)     Gross strength   Left/ right  flexion 4+/4+ 8/26 flexion 5/5 Hip abduction 4+/4  8/26 5/4+ Knee extension 4+/4    8/26 5/4+            LOWER EXTREMITY MMT:    MMT Right 10/13  Left 10/13   Hip flexion 18.8 15.3  Hip extension    Hip abduction 17.7 15.1  Hip adduction    Hip internal rotation    Hip external rotation    Knee flexion    Knee extension 20.9 23.2  Ankle dorsiflexion    Ankle plantarflexion    Ankle inversion    Ankle eversion     (Blank rows = not tested)                                                                                                                TREATMENT DATE:  10/13 Manual: Grade 2 and 3 PA and AP mobilizations to reduce pain and improve mobility of the  left knee.  Significant improvement in flexion noted with manual therapy.  Trigger point release to IT band.  Reviewed self trigger point release to IT band.  Self patellar mobilization. More emphasis on flexion ROM today.  There-ex:  Nu-step L5 5 min  SLR 2x12 each leg  LAQ 3x12  SAQ 3x12 Quad set 3x12  Standing weight shift 3x12   Manual muscle testing an review of goals.   10/9 Manual: Grade 2 and 3 PA and AP mobilizations to reduce pain and improve mobility of the left knee.  Significant improvement in flexion noted with manual therapy.  Trigger point release to IT band.  Reviewed self trigger point release to IT band.  Self patellar mobilization. More  emphasis on flexion ROM today.  There-ex:  Nu-step L5 5 min  SLR 2x12 each leg  LAQ 3x12  SAQ 3x12 Quad set 3x12  Standing weight shift 3x12   10/6   Manual: Grade 2 and 3 PA and AP mobilizations to reduce pain and improve mobility of the left knee.  Significant improvement in flexion noted with manual therapy.  Trigger point release to IT band.  Reviewed self trigger point release to IT band.  Self patellar mobilization. More emphasis on flexion ROM today.   There-ex:  Nu-step L5 5 min  SLR 2x12 each leg  LAQ 3x12  SAQ 2x20 2 lbs R L 1.5 bs   Step up  4 inch 2x10 fwd  4 inch lateral 2x10    10/1 Manual: Grade 2 and 3 PA and AP mobilizations to reduce pain and improve mobility of the left knee.  Significant improvement in flexion noted with manual therapy.  Trigger point release to IT band.  Reviewed self trigger point release to IT band.  Self patellar mobilization. More emphasis on flexion ROM today.  There-ex:  There-ex:  Nu-step L5 5 min  SLR 2x12 each leg  LAQ 3x12  SAQ 2x20 2 lbs R L 1.5 bs   Step up  4 inch 2x10 fwd  4 inch lateral 2x10     09/25 Manual: Grade 2 and 3 PA and AP mobilizations to reduce pain and improve mobility of the left knee.  Significant improvement in flexion noted with manual therapy.  Trigger point release to IT band.  Reviewed self trigger point release to IT band.  Self patellar mobilization. More emphasis on flexion ROM today.  There-ex:  Nu-step L5 5 min  SLR 3x12 each leg  LAQ 3x12   Neuro-re-ed:  Heel toe rock 3x12  Slow march with UE support 2x10     PATIENT EDUCATION:  Education details: Anatomy of condition, POC, HEP, exercise form/rationale Person educated: Patient Education method: Explanation, Demonstration, Tactile cues, Verbal cues, and Handouts Education comprehension: verbalized understanding, returned demonstration, verbal cues required, tactile cues required, and needs further education  HOME EXERCISE  PROGRAM: Access Code: [email or web address] URL: [email or web address]  ASSESSMENT:  CLINICAL IMPRESSION: Therapy performed progress note on the patient today. She is doing well. Her surgical leg strength is approaching her non-surgical leg strength. Her ROM of her hip has improved significantly. We have also focused on knee ROM. Her Knee ROM has progressed. We will continue 1-2W4 to finalize HEP with anticipation of D/C to HEP for right hip at that time. We continue to review quad and gluteal strengthening exercises. We did not perform steps today 2nd to left knee stiffness. See belwo for goal specific progress.    CLINICAL DECISION MAKING: Stable/uncomplicated  EVALUATION COMPLEXITY: Low  GOALS: Goals reviewed with patient? Yes  SHORT TERM GOALS: Target date: 01/21/2024    Sit<>stand without use of UEs Baseline: Goal status: Improving 10/14  2.  Standing hip hip to neutral extension Baseline:  Goal status: achieved 6/6    LONG TERM GOALS: Target date: 02/18/2024    Stand as long as necessary for work with minimal discomfort Baseline:  Goal status: achieved 8/5  2.  LEFS to improve by MDC x2 Baseline:  Goal status: progressing 10/14  3.  Hip abd strength to 80% of opposite LE Baseline:  Goal status: achieved 10/14 4.  Easily don socks and shoes independently Baseline:  Goal status: IN PROGRESS 8/5  5.  Begin training for return to pickle ball Baseline:  Goal status: has not returned 10/14     PLAN:  PT FREQUENCY: 1-2x/week  PT DURATION:8 weeks  PLANNED INTERVENTIONS: 97164- PT Re-evaluation, 97750- Physical Performance Testing, 97110-Therapeutic exercises, 97530- Therapeutic activity, 97112- Neuromuscular re-education, 97535- Self Care, 02859- Manual therapy, [phone]- Gait training, [phone]- Aquatic Therapy, Patient/Family education, Balance training, Stair training, Taping, Dry Needling, Joint mobilization, Spinal mobilization, Scar mobilization,  Cryotherapy, and Moist heat.  PLAN FOR NEXT SESSION: continue flexibility and strength to reduce post pelvic tilt.  Alm Don PT DPT  02/11/24 12:54 PM    Referring diagnosis?  S03.358 (ICD-10-CM) - Status post total replacement of right hip    Treatment diagnosis? (if different than referring diagnosis) M25.551 What was this (referring dx) caused by? [x]  Surgery []  Fall []  Ongoing issue []  Arthritis []  Other: ____________  Laterality: [x]  Rt []  Lt []  Both  Check all possible CPT codes:  *CHOOSE 10 OR LESS*    See Planned Interventions listed in the Plan section of the Evaluation.

## 2024-02-11 ENCOUNTER — Encounter (HOSPITAL_BASED_OUTPATIENT_CLINIC_OR_DEPARTMENT_OTHER): Payer: Self-pay | Admitting: Physical Therapy

## 2024-02-14 ENCOUNTER — Ambulatory Visit (HOSPITAL_BASED_OUTPATIENT_CLINIC_OR_DEPARTMENT_OTHER): Admitting: Physical Therapy

## 2024-02-14 DIAGNOSIS — M25562 Pain in left knee: Secondary | ICD-10-CM | POA: Diagnosis not present

## 2024-02-14 DIAGNOSIS — G8929 Other chronic pain: Secondary | ICD-10-CM

## 2024-02-14 DIAGNOSIS — R262 Difficulty in walking, not elsewhere classified: Secondary | ICD-10-CM | POA: Diagnosis not present

## 2024-02-14 DIAGNOSIS — H40022 Open angle with borderline findings, high risk, left eye: Secondary | ICD-10-CM | POA: Diagnosis not present

## 2024-02-14 DIAGNOSIS — R2689 Other abnormalities of gait and mobility: Secondary | ICD-10-CM

## 2024-02-14 DIAGNOSIS — M25551 Pain in right hip: Secondary | ICD-10-CM | POA: Diagnosis not present

## 2024-02-14 DIAGNOSIS — M25662 Stiffness of left knee, not elsewhere classified: Secondary | ICD-10-CM

## 2024-02-14 DIAGNOSIS — M6281 Muscle weakness (generalized): Secondary | ICD-10-CM

## 2024-02-14 DIAGNOSIS — H40011 Open angle with borderline findings, low risk, right eye: Secondary | ICD-10-CM | POA: Diagnosis not present

## 2024-02-14 NOTE — Therapy (Signed)
 OUTPATIENT PHYSICAL THERAPY  Progress Note Reporting Period 10/06/2023 to 12/03/2023  See note below for Objective Data and Assessment of Progress/Goals.        Patient Name: Jaime Rose MRN: [id number] DOB:[birth year], 73 y.o., female Today's Date: 02/14/2024  END OF SESSION:  PT End of Session - 02/14/24 1623     Visit Number 30    Number of Visits 41    Date for Recertification  04/06/24    Authorization Type 18 visits app 12-30-2023- 02-29-2024    PT Start Time 1515    PT Stop Time 1553    PT Time Calculation (min) 38 min    Activity Tolerance Patient tolerated treatment well    Behavior During Therapy [facility] for tasks assessed/performed                       Past Medical History:  Diagnosis Date   ALLERGIC RHINITIS    Allergy 1985   seasonal allergies   Arthritis    CHICKENPOX, HX OF 12/21/2009   Qualifier: Diagnosis of  By: Inocencio MD, [place] DELENA    Heart murmur    MR   Hyperthyroidism    normal TFTs 11/2009 , dx 10/2011   Pre-diabetes    Varicose vein    s/p sclerosis tx summer 2013   Past Surgical History:  Procedure Laterality Date   ENDOVENOUS ABLATION SAPHENOUS VEIN W/ LASER  04/11/2012   endovenous laser ablation right greater saphenous vein and stab phlebectomy  right leg  by Krystal Doing MD   NO PAST SURGERIES     TOTAL HIP ARTHROPLASTY Right 07/26/2023   Procedure: ARTHROPLASTY, HIP, TOTAL, ANTERIOR APPROACH;  Surgeon: Vernetta Lonni GRADE, MD;  Location: [facility];  Service: Orthopedics;  Laterality: Right;   Patient Active Problem List   Diagnosis Date Noted   Status post total replacement of right hip 07/26/2023   Hyperlipidemia 07/17/2023   Elevated coronary artery calcium  score 07/17/2023   Preoperative clearance 07/17/2023   Mitral regurgitation, moderate-possibly severe 07/11/2023   Vitamin D  deficiency 05/23/2023   Foot pain, left 06/02/2021   Ptosis of right eyelid 04/12/2020   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 12/29/2018    Osteopenia 01/10/2017   Carotid artery stenosis, asymptomatic, bilateral 12/21/2016   Prediabetes 12/21/2016   Hypothyroidism 03/20/2012   Varicose veins of bilateral lower extremities with other complications 12/21/2011   Allergic rhinitis 12/21/2009   Cardiac murmur 12/21/2009       REFERRING PROVIDER: Lonni Vernetta, MD  REFERRING DIAG:  [phone] (ICD-10-CM) - Status post total replacement of right hip     S/p Rt THA Rationale for Evaluation and Treatment: Rehabilitation  THERAPY DIAG:  Pain in right hip  Difficulty in walking, not elsewhere classified  Chronic pain of left knee  Stiffness of left knee, not elsewhere classified  Muscle weakness (generalized)  Other abnormalities of gait and mobility  ONSET DATE: DOS 07/26/23   SUBJECTIVE:  SUBJECTIVE STATEMENT: The patient reports she has been very sore over the past few days.  She reports an increase swelling and pain.  She has been working on stretching her knee.  PERTINENT HISTORY:  none  PAIN:  Are you having pain? Yes: NPRS scale: knee 3/10 hip 0/10 Pain location: Rt laeral hip Pain description: tight, tender Aggravating factors: walking Relieving factors: rest  PRECAUTIONS:  Anterior hip  RED FLAGS: None   WEIGHT BEARING RESTRICTIONS:  No  FALLS:  Has patient fallen in last 6 months? No  LIVING ENVIRONMENT: 4 steps to get into home, bedroom on main floor, has a second story.  OCCUPATION:  Next week to return to work   PLOF:  Independent  PATIENT GOALS:  Don my own sock and shoes. Preserve left knee to avoid TKA, play pickle ball   OBJECTIVE:  Note: Objective measures were completed at Evaluation unless otherwise noted.  PATIENT SURVEYS:  LEFS 28/80 LEFS 7/8 47/80  COGNITIVE  STATUS: Within functional limits for tasks assessed   SENSATION: WFL  EDEMA:  Yes: mild  POSTURE:  Lacking full hip extension bilaterally  GAIT: EVAL: antalgic lacking hip extension through stance phase/toe off   Body Part #1 Hip  PALPATION: Eval- edema and warmth  LOWER EXTREMITY ROM:     Active  Right eval Left eval Rt Lt  Standing 5/19 Right  6/6  Left 6/6 Left 8/5 Left 8/26 left Left  10/13  Hip flexion    105       Hip extension -12 -6 -7/-2        Hip abduction           Hip adduction           Hip internal rotation           Hip external rotation    40  95 98 101 103  Knee flexion           Knee extension 0 -8 -5  -5 -5 With poor -10 -8   (Blank rows = not tested)     Gross strength   Left/ right  flexion 4+/4+ 8/26 flexion 5/5 Hip abduction 4+/4  8/26 5/4+ Knee extension 4+/4    8/26 5/4+            LOWER EXTREMITY MMT:    MMT Right 10/13  Left 10/13   Hip flexion 18.8 15.3  Hip extension    Hip abduction 17.7 15.1  Hip adduction    Hip internal rotation    Hip external rotation    Knee flexion    Knee extension 20.9 23.2  Ankle dorsiflexion    Ankle plantarflexion    Ankle inversion    Ankle eversion     (Blank rows = not tested)                                                                                                                TREATMENT DATE:  10/17 Manual: Grade 2 and 3 PA and  AP mobilizations to reduce pain and improve mobility of the left knee.  Trigger point release to IT band.  Reviewed self trigger point release to IT band.   Self patellar mobilization Patella mobilization  Edema massage   There-ex:  Quad set 3x12  SLR 3x12 each leg   10/13 Manual: Grade 2 and 3 PA and AP mobilizations to reduce pain and improve mobility of the left knee.  Significant improvement in flexion noted with manual therapy.  Trigger point release to IT band.  Reviewed self trigger point release to IT band.  Self patellar  mobilization. More emphasis on flexion ROM today.  There-ex:  Nu-step L5 5 min  SLR 2x12 each leg  LAQ 3x12  SAQ 3x12 Quad set 3x12  Standing weight shift 3x12   Manual muscle testing an review of goals.   10/9 Manual: Grade 2 and 3 PA and AP mobilizations to reduce pain and improve mobility of the left knee.  Significant improvement in flexion noted with manual therapy.  Trigger point release to IT band.  Reviewed self trigger point release to IT band.  Self patellar mobilization. More emphasis on flexion ROM today.  There-ex:  Nu-step L5 5 min  SLR 2x12 each leg  LAQ 3x12  SAQ 3x12 Quad set 3x12  Standing weight shift 3x12   10/6   Manual: Grade 2 and 3 PA and AP mobilizations to reduce pain and improve mobility of the left knee.  Significant improvement in flexion noted with manual therapy.  Trigger point release to IT band.  Reviewed self trigger point release to IT band.  Self patellar mobilization. More emphasis on flexion ROM today.   There-ex:  Nu-step L5 5 min  SLR 2x12 each leg  LAQ 3x12  SAQ 2x20 2 lbs R L 1.5 bs   Step up  4 inch 2x10 fwd  4 inch lateral 2x10    10/1 Manual: Grade 2 and 3 PA and AP mobilizations to reduce pain and improve mobility of the left knee.  Significant improvement in flexion noted with manual therapy.  Trigger point release to IT band.  Reviewed self trigger point release to IT band.  Self patellar mobilization. More emphasis on flexion ROM today.  There-ex:  There-ex:  Nu-step L5 5 min  SLR 2x12 each leg  LAQ 3x12  SAQ 2x20 2 lbs R L 1.5 bs   Step up  4 inch 2x10 fwd  4 inch lateral 2x10     09/25 Manual: Grade 2 and 3 PA and AP mobilizations to reduce pain and improve mobility of the left knee.  Significant improvement in flexion noted with manual therapy.  Trigger point release to IT band.  Reviewed self trigger point release to IT band.  Self patellar mobilization. More emphasis on flexion ROM today.  There-ex:   Nu-step L5 5 min  SLR 3x12 each leg  LAQ 3x12   Neuro-re-ed:  Heel toe rock 3x12  Slow march with UE support 2x10     PATIENT EDUCATION:  Education details: Anatomy of condition, POC, HEP, exercise form/rationale Person educated: Patient Education method: Explanation, Demonstration, Tactile cues, Verbal cues, and Handouts Education comprehension: verbalized understanding, returned demonstration, verbal cues required, tactile cues required, and needs further education  HOME EXERCISE PROGRAM: Access Code: [email or web address] URL: [email or web address]  ASSESSMENT:  CLINICAL IMPRESSION: Patient had increase stiffness in her knee today.  She had more limitations in her range of motion.  Total arc was approximately 15 to  90 degrees.  Therapy focused on manual therapy to reduce inflammation.  She had increased bending following manual therapy.  Basic exercises today.  We are unable to work on stairs secondary to pain.  Therapy will continue to progress as tolerated.   SABRACLINICAL DECISION MAKING: Stable/uncomplicated  EVALUATION COMPLEXITY: Low   GOALS: Goals reviewed with patient? Yes  SHORT TERM GOALS: Target date: 01/21/2024    Sit<>stand without use of UEs Baseline: Goal status: Improving 10/14  2.  Standing hip hip to neutral extension Baseline:  Goal status: achieved 6/6    LONG TERM GOALS: Target date: 02/18/2024    Stand as long as necessary for work with minimal discomfort Baseline:  Goal status: achieved 8/5  2.  LEFS to improve by MDC x2 Baseline:  Goal status: progressing 10/14  3.  Hip abd strength to 80% of opposite LE Baseline:  Goal status: achieved 10/14 4.  Easily don socks and shoes independently Baseline:  Goal status: IN PROGRESS 8/5  5.  Begin training for return to pickle ball Baseline:  Goal status: has not returned 10/14     PLAN:  PT FREQUENCY: 1-2x/week  PT DURATION:8 weeks  PLANNED INTERVENTIONS: 97164- PT  Re-evaluation, 97750- Physical Performance Testing, 97110-Therapeutic exercises, 97530- Therapeutic activity, 97112- Neuromuscular re-education, 97535- Self Care, 02859- Manual therapy, [phone]- Gait training, [phone]- Aquatic Therapy, Patient/Family education, Balance training, Stair training, Taping, Dry Needling, Joint mobilization, Spinal mobilization, Scar mobilization, Cryotherapy, and Moist heat.  PLAN FOR NEXT SESSION: continue flexibility and strength to reduce post pelvic tilt.  Alm Don PT DPT  02/14/24 4:33 PM    Referring diagnosis?  S03.358 (ICD-10-CM) - Status post total replacement of right hip    Treatment diagnosis? (if different than referring diagnosis) M25.551 What was this (referring dx) caused by? [x]  Surgery []  Fall []  Ongoing issue []  Arthritis []  Other: ____________  Laterality: [x]  Rt []  Lt []  Both  Check all possible CPT codes:  *CHOOSE 10 OR LESS*    See Planned Interventions listed in the Plan section of the Evaluation.

## 2024-02-19 ENCOUNTER — Ambulatory Visit (HOSPITAL_BASED_OUTPATIENT_CLINIC_OR_DEPARTMENT_OTHER): Admitting: Physical Therapy

## 2024-02-19 ENCOUNTER — Encounter (HOSPITAL_BASED_OUTPATIENT_CLINIC_OR_DEPARTMENT_OTHER): Payer: Self-pay | Admitting: Physical Therapy

## 2024-02-19 DIAGNOSIS — G8929 Other chronic pain: Secondary | ICD-10-CM | POA: Diagnosis not present

## 2024-02-19 DIAGNOSIS — M6281 Muscle weakness (generalized): Secondary | ICD-10-CM

## 2024-02-19 DIAGNOSIS — R262 Difficulty in walking, not elsewhere classified: Secondary | ICD-10-CM

## 2024-02-19 DIAGNOSIS — M25662 Stiffness of left knee, not elsewhere classified: Secondary | ICD-10-CM | POA: Diagnosis not present

## 2024-02-19 DIAGNOSIS — M25551 Pain in right hip: Secondary | ICD-10-CM

## 2024-02-19 DIAGNOSIS — R2689 Other abnormalities of gait and mobility: Secondary | ICD-10-CM | POA: Diagnosis not present

## 2024-02-19 DIAGNOSIS — M25562 Pain in left knee: Secondary | ICD-10-CM | POA: Diagnosis not present

## 2024-02-19 NOTE — Therapy (Unsigned)
 OUTPATIENT PHYSICAL THERAPY  Progress Note Reporting Period 10/06/2023 to 12/03/2023  See note below for Objective Data and Assessment of Progress/Goals.        Patient Name: Jaime Rose MRN: [id number] DOB:Oct 23, 1950, 73 y.o., female Today's Date: 02/20/2024  END OF SESSION:  PT End of Session - 02/19/24 1722     Visit Number 31    Number of Visits 41    Date for Recertification  04/06/24    Authorization Type 18 visits app 12-30-2023- 02-29-2024    PT Start Time 1515    PT Stop Time 1558    PT Time Calculation (min) 43 min    Activity Tolerance Patient tolerated treatment well    Behavior During Therapy [facility] for tasks assessed/performed                        Past Medical History:  Diagnosis Date   ALLERGIC RHINITIS    Allergy 1985   seasonal allergies   Arthritis    CHICKENPOX, HX OF 12/21/2009   Qualifier: Diagnosis of  By: Inocencio MD, [place] DELENA    Heart murmur    MR   Hyperthyroidism    normal TFTs 11/2009 , dx 10/2011   Pre-diabetes    Varicose vein    s/p sclerosis tx summer 2013   Past Surgical History:  Procedure Laterality Date   ENDOVENOUS ABLATION SAPHENOUS VEIN W/ LASER  04/11/2012   endovenous laser ablation right greater saphenous vein and stab phlebectomy  right leg  by Krystal Doing MD   NO PAST SURGERIES     TOTAL HIP ARTHROPLASTY Right 07/26/2023   Procedure: ARTHROPLASTY, HIP, TOTAL, ANTERIOR APPROACH;  Surgeon: Vernetta Lonni GRADE, MD;  Location: [facility];  Service: Orthopedics;  Laterality: Right;   Patient Active Problem List   Diagnosis Date Noted   Status post total replacement of right hip 07/26/2023   Hyperlipidemia 07/17/2023   Elevated coronary artery calcium  score 07/17/2023   Preoperative clearance 07/17/2023   Mitral regurgitation, moderate-possibly severe 07/11/2023   Vitamin D  deficiency 05/23/2023   Foot pain, left 06/02/2021   Ptosis of right eyelid 04/12/2020   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  12/29/2018   Osteopenia 01/10/2017   Carotid artery stenosis, asymptomatic, bilateral 12/21/2016   Prediabetes 12/21/2016   Hypothyroidism 03/20/2012   Varicose veins of bilateral lower extremities with other complications 12/21/2011   Allergic rhinitis 12/21/2009   Cardiac murmur 12/21/2009       REFERRING PROVIDER: Lonni Vernetta, MD  REFERRING DIAG:  [phone] (ICD-10-CM) - Status post total replacement of right hip     S/p Rt THA Rationale for Evaluation and Treatment: Rehabilitation  THERAPY DIAG:  Pain in right hip  Difficulty in walking, not elsewhere classified  Chronic pain of left knee  Stiffness of left knee, not elsewhere classified  Muscle weakness (generalized)  Other abnormalities of gait and mobility  ONSET DATE: DOS 07/26/23   SUBJECTIVE:  SUBJECTIVE STATEMENT: The patien reports her  PERTINENT HISTORY:  none  PAIN:  Are you having pain? Yes: NPRS scale: knee 3/10 hip 0/10 Pain location: Rt laeral hip Pain description: tight, tender Aggravating factors: walking Relieving factors: rest  PRECAUTIONS:  Anterior hip  RED FLAGS: None   WEIGHT BEARING RESTRICTIONS:  No  FALLS:  Has patient fallen in last 6 months? No  LIVING ENVIRONMENT: 4 steps to get into home, bedroom on main floor, has a second story.  OCCUPATION:  Next week to return to work   PLOF:  Independent  PATIENT GOALS:  Don my own sock and shoes. Preserve left knee to avoid TKA, play pickle ball   OBJECTIVE:  Note: Objective measures were completed at Evaluation unless otherwise noted.  PATIENT SURVEYS:  LEFS 28/80 LEFS 7/8 47/80  COGNITIVE STATUS: Within functional limits for tasks assessed   SENSATION: WFL  EDEMA:  Yes: mild  POSTURE:  Lacking full hip extension  bilaterally  GAIT: EVAL: antalgic lacking hip extension through stance phase/toe off   Body Part #1 Hip  PALPATION: Eval- edema and warmth  LOWER EXTREMITY ROM:     Active  Right eval Left eval Rt Lt  Standing 5/19 Right  6/6  Left 6/6 Left 8/5 Left 8/26 left Left  10/13 10/22  Hip flexion    105        Hip extension -12 -6 -7/-2         Hip abduction            Hip adduction            Hip internal rotation            Hip external rotation    40  95 98 101 103 106  Knee flexion            Knee extension 0 -8 -5  -5 -5 With poor -10 -8 -5   (Blank rows = not tested)     Gross strength   Left/ right  flexion 4+/4+ 8/26 flexion 5/5 Hip abduction 4+/4  8/26 5/4+ Knee extension 4+/4    8/26 5/4+            LOWER EXTREMITY MMT:    MMT Right 10/13  Left 10/13   Hip flexion 18.8 15.3  Hip extension    Hip abduction 17.7 15.1  Hip adduction    Hip internal rotation    Hip external rotation    Knee flexion    Knee extension 20.9 23.2  Ankle dorsiflexion    Ankle plantarflexion    Ankle inversion    Ankle eversion     (Blank rows = not tested)                                                                                                                TREATMENT DATE:  10/22 Manual: Grade 2 and 3 PA and AP mobilizations to reduce pain and improve mobility of the left knee.  Trigger point release to  IT band.  Reviewed self trigger point release to IT band.   Self patellar mobilization Patella mobilization  Edema massage   There-ex:  SLR 2x12 each leg   SAQ 3x12 3lbs on the right no weight on the left   There-act:  Standing weight shift 3x12  Step up 6 inch rright 2x10  4 inch left 2x10   Neuro-re-ed:  Step onto air-ex 2x10 fwd   10/17 Manual: Grade 2 and 3 PA and AP mobilizations to reduce pain and improve mobility of the left knee.  Trigger point release to IT band.  Reviewed self trigger point release to IT band.   Self patellar  mobilization Patella mobilization  Edema massage   There-ex:  Quad set 3x12  SLR 3x12 each leg   10/13 Manual: Grade 2 and 3 PA and AP mobilizations to reduce pain and improve mobility of the left knee.  Significant improvement in flexion noted with manual therapy.  Trigger point release to IT band.  Reviewed self trigger point release to IT band.  Self patellar mobilization. More emphasis on flexion ROM today.  There-ex:  Nu-step L5 5 min  SLR 2x12 each leg  LAQ 3x12  SAQ 3x12 Quad set 3x12  Standing weight shift 3x12   Manual muscle testing an review of goals.   10/9 Manual: Grade 2 and 3 PA and AP mobilizations to reduce pain and improve mobility of the left knee.  Significant improvement in flexion noted with manual therapy.  Trigger point release to IT band.  Reviewed self trigger point release to IT band.  Self patellar mobilization. More emphasis on flexion ROM today.  There-ex:  Nu-step L5 5 min  SLR 2x12 each leg  LAQ 3x12  SAQ 3x12 Quad set 3x12  Standing weight shift 3x12   10/6   Manual: Grade 2 and 3 PA and AP mobilizations to reduce pain and improve mobility of the left knee.  Significant improvement in flexion noted with manual therapy.  Trigger point release to IT band.  Reviewed self trigger point release to IT band.  Self patellar mobilization. More emphasis on flexion ROM today.   There-ex:  Nu-step L5 5 min  SLR 2x12 each leg  LAQ 3x12  SAQ 2x20 2 lbs R L 1.5 bs   Step up  4 inch 2x10 fwd  4 inch lateral 2x10    10/1 Manual: Grade 2 and 3 PA and AP mobilizations to reduce pain and improve mobility of the left knee.  Significant improvement in flexion noted with manual therapy.  Trigger point release to IT band.  Reviewed self trigger point release to IT band.  Self patellar mobilization. More emphasis on flexion ROM today.  There-ex:  There-ex:  Nu-step L5 5 min  SLR 2x12 each leg  LAQ 3x12  SAQ 2x20 2 lbs R L 1.5 bs   Step up  4 inch  2x10 fwd  4 inch lateral 2x10     09/25 Manual: Grade 2 and 3 PA and AP mobilizations to reduce pain and improve mobility of the left knee.  Significant improvement in flexion noted with manual therapy.  Trigger point release to IT band.  Reviewed self trigger point release to IT band.  Self patellar mobilization. More emphasis on flexion ROM today.  There-ex:  Nu-step L5 5 min  SLR 3x12 each leg  LAQ 3x12   Neuro-re-ed:  Heel toe rock 3x12  Slow march with UE support 2x10     PATIENT EDUCATION:  Education details: Anatomy of condition, POC, HEP, exercise form/rationale Person educated: Patient Education method: Explanation, Demonstration, Tactile cues, Verbal cues, and Handouts Education comprehension: verbalized understanding, returned demonstration, verbal cues required, tactile cues required, and needs further education  HOME EXERCISE PROGRAM: Access Code: [email or web address] URL: [email or web address]  ASSESSMENT:  CLINICAL IMPRESSION: Patient had increase stiffness in her knee today.  She had more limitations in her range of motion.  Total arc was approximately 15 to  90 degrees.  Therapy focused on manual therapy to reduce inflammation.  She had increased bending following manual therapy.  Basic exercises today.  We are unable to work on stairs secondary to pain.  Therapy will continue to progress as tolerated.   SABRACLINICAL DECISION MAKING: Stable/uncomplicated  EVALUATION COMPLEXITY: Low   GOALS: Goals reviewed with patient? Yes  SHORT TERM GOALS: Target date: 01/21/2024    Sit<>stand without use of UEs Baseline: Goal status: Improving 10/14  2.  Standing hip hip to neutral extension Baseline:  Goal status: achieved 6/6    LONG TERM GOALS: Target date: 02/18/2024    Stand as long as necessary for work with minimal discomfort Baseline:  Goal status: achieved 8/5  2.  LEFS to improve by MDC x2 Baseline:  Goal status: progressing 10/14  3.   Hip abd strength to 80% of opposite LE Baseline:  Goal status: achieved 10/14 4.  Easily don socks and shoes independently Baseline:  Goal status: IN PROGRESS 8/5  5.  Begin training for return to pickle ball Baseline:  Goal status: has not returned 10/14     PLAN:  PT FREQUENCY: 1-2x/week  PT DURATION:8 weeks  PLANNED INTERVENTIONS: 97164- PT Re-evaluation, 97750- Physical Performance Testing, 97110-Therapeutic exercises, 97530- Therapeutic activity, 97112- Neuromuscular re-education, 97535- Self Care, 02859- Manual therapy, [phone]- Gait training, [phone]- Aquatic Therapy, Patient/Family education, Balance training, Stair training, Taping, Dry Needling, Joint mobilization, Spinal mobilization, Scar mobilization, Cryotherapy, and Moist heat.  PLAN FOR NEXT SESSION: continue flexibility and strength to reduce post pelvic tilt.  Alm Don PT DPT  02/20/24 9:08 AM    Referring diagnosis?  S03.358 (ICD-10-CM) - Status post total replacement of right hip    Treatment diagnosis? (if different than referring diagnosis) M25.551 What was this (referring dx) caused by? [x]  Surgery []  Fall []  Ongoing issue []  Arthritis []  Other: ____________  Laterality: [x]  Rt []  Lt []  Both  Check all possible CPT codes:  *CHOOSE 10 OR LESS*    See Planned Interventions listed in the Plan section of the Evaluation.

## 2024-02-20 ENCOUNTER — Encounter (HOSPITAL_BASED_OUTPATIENT_CLINIC_OR_DEPARTMENT_OTHER): Payer: Self-pay | Admitting: Physical Therapy

## 2024-02-21 ENCOUNTER — Encounter (HOSPITAL_BASED_OUTPATIENT_CLINIC_OR_DEPARTMENT_OTHER): Payer: Self-pay | Admitting: Physical Therapy

## 2024-02-21 ENCOUNTER — Ambulatory Visit (HOSPITAL_BASED_OUTPATIENT_CLINIC_OR_DEPARTMENT_OTHER): Admitting: Physical Therapy

## 2024-02-21 DIAGNOSIS — M25551 Pain in right hip: Secondary | ICD-10-CM | POA: Diagnosis not present

## 2024-02-21 DIAGNOSIS — M6281 Muscle weakness (generalized): Secondary | ICD-10-CM

## 2024-02-21 DIAGNOSIS — M25662 Stiffness of left knee, not elsewhere classified: Secondary | ICD-10-CM

## 2024-02-21 DIAGNOSIS — R2689 Other abnormalities of gait and mobility: Secondary | ICD-10-CM

## 2024-02-21 DIAGNOSIS — R262 Difficulty in walking, not elsewhere classified: Secondary | ICD-10-CM

## 2024-02-21 DIAGNOSIS — G8929 Other chronic pain: Secondary | ICD-10-CM | POA: Diagnosis not present

## 2024-02-21 DIAGNOSIS — M25562 Pain in left knee: Secondary | ICD-10-CM | POA: Diagnosis not present

## 2024-02-23 ENCOUNTER — Encounter (HOSPITAL_BASED_OUTPATIENT_CLINIC_OR_DEPARTMENT_OTHER): Payer: Self-pay | Admitting: Physical Therapy

## 2024-02-23 NOTE — Therapy (Signed)
 OUTPATIENT PHYSICAL THERAPY  Progress Note Reporting Period 10/06/2023 to 12/03/2023  See note below for Objective Data and Assessment of Progress/Goals.        Patient Name: Jaime Rose MRN: [id number] DOB:[birth year], 73 y.o., female Today's Date: 02/23/2024  END OF SESSION:                  Past Medical History:  Diagnosis Date   ALLERGIC RHINITIS    Allergy 1985   seasonal allergies   Arthritis    CHICKENPOX, HX OF 12/21/2009   Qualifier: Diagnosis of  By: Inocencio MD, [place] DELENA    Heart murmur    MR   Hyperthyroidism    normal TFTs 11/2009 , dx 10/2011   Pre-diabetes    Varicose vein    s/p sclerosis tx summer 2013   Past Surgical History:  Procedure Laterality Date   ENDOVENOUS ABLATION SAPHENOUS VEIN W/ LASER  04/11/2012   endovenous laser ablation right greater saphenous vein and stab phlebectomy  right leg  by Krystal Doing MD   NO PAST SURGERIES     TOTAL HIP ARTHROPLASTY Right 07/26/2023   Procedure: ARTHROPLASTY, HIP, TOTAL, ANTERIOR APPROACH;  Surgeon: Vernetta Lonni GRADE, MD;  Location: [facility];  Service: Orthopedics;  Laterality: Right;   Patient Active Problem List   Diagnosis Date Noted   Status post total replacement of right hip 07/26/2023   Hyperlipidemia 07/17/2023   Elevated coronary artery calcium  score 07/17/2023   Preoperative clearance 07/17/2023   Mitral regurgitation, moderate-possibly severe 07/11/2023   Vitamin D  deficiency 05/23/2023   Foot pain, left 06/02/2021   Ptosis of right eyelid 04/12/2020   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 12/29/2018   Osteopenia 01/10/2017   Carotid artery stenosis, asymptomatic, bilateral 12/21/2016   Prediabetes 12/21/2016   Hypothyroidism 03/20/2012   Varicose veins of bilateral lower extremities with other complications 12/21/2011   Allergic rhinitis 12/21/2009   Cardiac murmur 12/21/2009       REFERRING PROVIDER: Lonni Vernetta, MD  REFERRING DIAG:  [phone] (ICD-10-CM)  - Status post total replacement of right hip     S/p Rt THA Rationale for Evaluation and Treatment: Rehabilitation  THERAPY DIAG:  Pain in right hip  Difficulty in walking, not elsewhere classified  Chronic pain of left knee  Stiffness of left knee, not elsewhere classified  Muscle weakness (generalized)  Other abnormalities of gait and mobility  ONSET DATE: DOS 07/26/23   SUBJECTIVE:  SUBJECTIVE STATEMENT: The patien reports her left knee has been very sore today.  She reports she had a couple good days. PERTINENT HISTORY:  none  PAIN:  Are you having pain? Yes: NPRS scale: knee 3/10 hip 0/10 Pain location: Rt laeral hip Pain description: tight, tender Aggravating factors: walking Relieving factors: rest  PRECAUTIONS:  Anterior hip  RED FLAGS: None   WEIGHT BEARING RESTRICTIONS:  No  FALLS:  Has patient fallen in last 6 months? No  LIVING ENVIRONMENT: 4 steps to get into home, bedroom on main floor, has a second story.  OCCUPATION:  Next week to return to work   PLOF:  Independent  PATIENT GOALS:  Don my own sock and shoes. Preserve left knee to avoid TKA, play pickle ball   OBJECTIVE:  Note: Objective measures were completed at Evaluation unless otherwise noted.  PATIENT SURVEYS:  LEFS 28/80 LEFS 7/8 47/80  COGNITIVE STATUS: Within functional limits for tasks assessed   SENSATION: WFL  EDEMA:  Yes: mild  POSTURE:  Lacking full hip extension bilaterally  GAIT: EVAL: antalgic lacking hip extension through stance phase/toe off   Body Part #1 Hip  PALPATION: Eval- edema and warmth  LOWER EXTREMITY ROM:     Active  Right eval Left eval Rt Lt  Standing 5/19 Right  6/6  Left 6/6 Left 8/5 Left 8/26 left Left  10/13 10/22  Hip flexion    105         Hip extension -12 -6 -7/-2         Hip abduction            Hip adduction            Hip internal rotation            Hip external rotation    40  95 98 101 103 106  Knee flexion            Knee extension 0 -8 -5  -5 -5 With poor -10 -8 -5   (Blank rows = not tested)     Gross strength   Left/ right  flexion 4+/4+ 8/26 flexion 5/5 Hip abduction 4+/4  8/26 5/4+ Knee extension 4+/4    8/26 5/4+            LOWER EXTREMITY MMT:    MMT Right 10/13  Left 10/13   Hip flexion 18.8 15.3  Hip extension    Hip abduction 17.7 15.1  Hip adduction    Hip internal rotation    Hip external rotation    Knee flexion    Knee extension 20.9 23.2  Ankle dorsiflexion    Ankle plantarflexion    Ankle inversion    Ankle eversion     (Blank rows = not tested)                                                                                                                TREATMENT DATE:  10/24 Manual: Grade 2 and 3 PA and AP mobilizations  to reduce pain and improve mobility of the left knee.  Trigger point release to IT band.  Reviewed self trigger point release to IT band.   Self patellar mobilization Patella mobilization  Edema massage  There-ex:  SLR 2x12 each leg   SAQ 3x12 3lbs on the right no weight on the left   Neuromuscular reeducation: Heel toe rock 3 x 10  Slow march with weightbearing on right 3 x 10  10/22 Manual: Grade 2 and 3 PA and AP mobilizations to reduce pain and improve mobility of the left knee.  Trigger point release to IT band.  Reviewed self trigger point release to IT band.   Self patellar mobilization Patella mobilization  Edema massage   There-ex:  SLR 2x12 each leg   SAQ 3x12 3lbs on the right no weight on the left   There-act:  Standing weight shift 3x12  Step up 6 inch rright 2x10  4 inch left 2x10   Neuro-re-ed:  Step onto air-ex 2x10 fwd   10/17 Manual: Grade 2 and 3 PA and AP mobilizations to reduce pain and improve mobility of  the left knee.  Trigger point release to IT band.  Reviewed self trigger point release to IT band.   Self patellar mobilization Patella mobilization  Edema massage   There-ex:  Quad set 3x12  SLR 3x12 each leg   10/13 Manual: Grade 2 and 3 PA and AP mobilizations to reduce pain and improve mobility of the left knee.  Significant improvement in flexion noted with manual therapy.  Trigger point release to IT band.  Reviewed self trigger point release to IT band.  Self patellar mobilization. More emphasis on flexion ROM today.  There-ex:  Nu-step L5 5 min  SLR 2x12 each leg  LAQ 3x12  SAQ 3x12 Quad set 3x12  Standing weight shift 3x12   Manual muscle testing an review of goals.   10/9 Manual: Grade 2 and 3 PA and AP mobilizations to reduce pain and improve mobility of the left knee.  Significant improvement in flexion noted with manual therapy.  Trigger point release to IT band.  Reviewed self trigger point release to IT band.  Self patellar mobilization. More emphasis on flexion ROM today.  There-ex:  Nu-step L5 5 min  SLR 2x12 each leg  LAQ 3x12  SAQ 3x12 Quad set 3x12  Standing weight shift 3x12   10/6   Manual: Grade 2 and 3 PA and AP mobilizations to reduce pain and improve mobility of the left knee.  Significant improvement in flexion noted with manual therapy.  Trigger point release to IT band.  Reviewed self trigger point release to IT band.  Self patellar mobilization. More emphasis on flexion ROM today.   There-ex:  Nu-step L5 5 min  SLR 2x12 each leg  LAQ 3x12  SAQ 2x20 2 lbs R L 1.5 bs   Step up  4 inch 2x10 fwd  4 inch lateral 2x10    10/1 Manual: Grade 2 and 3 PA and AP mobilizations to reduce pain and improve mobility of the left knee.  Significant improvement in flexion noted with manual therapy.  Trigger point release to IT band.  Reviewed self trigger point release to IT band.  Self patellar mobilization. More emphasis on flexion ROM  today.  There-ex:  There-ex:  Nu-step L5 5 min  SLR 2x12 each leg  LAQ 3x12  SAQ 2x20 2 lbs R L 1.5 bs   Step up  4 inch 2x10  fwd  4 inch lateral 2x10     09/25 Manual: Grade 2 and 3 PA and AP mobilizations to reduce pain and improve mobility of the left knee.  Significant improvement in flexion noted with manual therapy.  Trigger point release to IT band.  Reviewed self trigger point release to IT band.  Self patellar mobilization. More emphasis on flexion ROM today.  There-ex:  Nu-step L5 5 min  SLR 3x12 each leg  LAQ 3x12   Neuro-re-ed:  Heel toe rock 3x12  Slow march with UE support 2x10     PATIENT EDUCATION:  Education details: Anatomy of condition, POC, HEP, exercise form/rationale Person educated: Patient Education method: Explanation, Demonstration, Tactile cues, Verbal cues, and Handouts Education comprehension: verbalized understanding, returned demonstration, verbal cues required, tactile cues required, and needs further education  HOME EXERCISE PROGRAM: Access Code: [email or web address] URL: [email or web address]  ASSESSMENT:  CLINICAL IMPRESSION: Patient had improved knee flexion today.  She continues to have significant left knee pain.  Her right hip continues to progress well.  Worked on weightbearing exercises for her right hip.  We also continue to load her right leg for strengthening.  Her left knee is limiting to her ability to ambulate and progress her right hip program.  She will be seeing a doctor in November about her left knee.  SABRACLINICAL DECISION MAKING: Stable/uncomplicated  EVALUATION COMPLEXITY: Low   GOALS: Goals reviewed with patient? Yes  SHORT TERM GOALS: Target date: 01/21/2024    Sit<>stand without use of UEs Baseline: Goal status: Improving 10/14  2.  Standing hip hip to neutral extension Baseline:  Goal status: achieved 6/6    LONG TERM GOALS: Target date: 02/18/2024    Stand as long as necessary for work  with minimal discomfort Baseline:  Goal status: achieved 8/5  2.  LEFS to improve by MDC x2 Baseline:  Goal status: progressing 10/14  3.  Hip abd strength to 80% of opposite LE Baseline:  Goal status: achieved 10/14 4.  Easily don socks and shoes independently Baseline:  Goal status: IN PROGRESS 8/5  5.  Begin training for return to pickle ball Baseline:  Goal status: has not returned 10/14     PLAN:  PT FREQUENCY: 1-2x/week  PT DURATION:8 weeks  PLANNED INTERVENTIONS: 97164- PT Re-evaluation, 97750- Physical Performance Testing, 97110-Therapeutic exercises, 97530- Therapeutic activity, 97112- Neuromuscular re-education, 97535- Self Care, 02859- Manual therapy, [phone]- Gait training, [phone]- Aquatic Therapy, Patient/Family education, Balance training, Stair training, Taping, Dry Needling, Joint mobilization, Spinal mobilization, Scar mobilization, Cryotherapy, and Moist heat.  PLAN FOR NEXT SESSION: continue flexibility and strength to reduce post pelvic tilt.  Alm Don PT DPT  02/23/24 2:39 PM    Referring diagnosis?  S03.358 (ICD-10-CM) - Status post total replacement of right hip    Treatment diagnosis? (if different than referring diagnosis) M25.551 What was this (referring dx) caused by? [x]  Surgery []  Fall []  Ongoing issue []  Arthritis []  Other: ____________  Laterality: [x]  Rt []  Lt []  Both  Check all possible CPT codes:  *CHOOSE 10 OR LESS*    See Planned Interventions listed in the Plan section of the Evaluation.

## 2024-02-26 ENCOUNTER — Encounter (HOSPITAL_BASED_OUTPATIENT_CLINIC_OR_DEPARTMENT_OTHER): Payer: Self-pay | Admitting: Physical Therapy

## 2024-02-26 ENCOUNTER — Ambulatory Visit (HOSPITAL_BASED_OUTPATIENT_CLINIC_OR_DEPARTMENT_OTHER): Admitting: Physical Therapy

## 2024-02-26 DIAGNOSIS — R2689 Other abnormalities of gait and mobility: Secondary | ICD-10-CM

## 2024-02-26 DIAGNOSIS — M25662 Stiffness of left knee, not elsewhere classified: Secondary | ICD-10-CM | POA: Diagnosis not present

## 2024-02-26 DIAGNOSIS — M25551 Pain in right hip: Secondary | ICD-10-CM | POA: Diagnosis not present

## 2024-02-26 DIAGNOSIS — M6281 Muscle weakness (generalized): Secondary | ICD-10-CM

## 2024-02-26 DIAGNOSIS — G8929 Other chronic pain: Secondary | ICD-10-CM

## 2024-02-26 DIAGNOSIS — R262 Difficulty in walking, not elsewhere classified: Secondary | ICD-10-CM

## 2024-02-26 DIAGNOSIS — M25562 Pain in left knee: Secondary | ICD-10-CM | POA: Diagnosis not present

## 2024-02-27 ENCOUNTER — Encounter (HOSPITAL_BASED_OUTPATIENT_CLINIC_OR_DEPARTMENT_OTHER): Payer: Self-pay | Admitting: Physical Therapy

## 2024-02-27 NOTE — Therapy (Signed)
 OUTPATIENT PHYSICAL THERAPY  Progress Note Reporting Period 10/06/2023 to 12/03/2023  See note below for Objective Data and Assessment of Progress/Goals.        Patient Name: Jaime Rose MRN: [id number] DOB:[birth year], 73 y.o., female Today's Date: 02/27/2024  END OF SESSION:  PT End of Session - 02/26/24 1542     Visit Number 32    Number of Visits 41    Date for Recertification  04/06/24    Authorization Type 18 visits app 12-30-2023- 02-29-2024    PT Start Time 1518    PT Stop Time 1600    PT Time Calculation (min) 42 min    Activity Tolerance Patient tolerated treatment well    Behavior During Therapy [facility] for tasks assessed/performed                         Past Medical History:  Diagnosis Date   ALLERGIC RHINITIS    Allergy 1985   seasonal allergies   Arthritis    CHICKENPOX, HX OF 12/21/2009   Qualifier: Diagnosis of  By: Inocencio MD, [place] DELENA    Heart murmur    MR   Hyperthyroidism    normal TFTs 11/2009 , dx 10/2011   Pre-diabetes    Varicose vein    s/p sclerosis tx summer 2013   Past Surgical History:  Procedure Laterality Date   ENDOVENOUS ABLATION SAPHENOUS VEIN W/ LASER  04/11/2012   endovenous laser ablation right greater saphenous vein and stab phlebectomy  right leg  by Krystal Doing MD   NO PAST SURGERIES     TOTAL HIP ARTHROPLASTY Right 07/26/2023   Procedure: ARTHROPLASTY, HIP, TOTAL, ANTERIOR APPROACH;  Surgeon: Vernetta Lonni GRADE, MD;  Location: [facility];  Service: Orthopedics;  Laterality: Right;   Patient Active Problem List   Diagnosis Date Noted   Status post total replacement of right hip 07/26/2023   Hyperlipidemia 07/17/2023   Elevated coronary artery calcium  score 07/17/2023   Preoperative clearance 07/17/2023   Mitral regurgitation, moderate-possibly severe 07/11/2023   Vitamin D  deficiency 05/23/2023   Foot pain, left 06/02/2021   Ptosis of right eyelid 04/12/2020   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  12/29/2018   Osteopenia 01/10/2017   Carotid artery stenosis, asymptomatic, bilateral 12/21/2016   Prediabetes 12/21/2016   Hypothyroidism 03/20/2012   Varicose veins of bilateral lower extremities with other complications 12/21/2011   Allergic rhinitis 12/21/2009   Cardiac murmur 12/21/2009       REFERRING PROVIDER: Lonni Vernetta, MD  REFERRING DIAG:  [phone] (ICD-10-CM) - Status post total replacement of right hip     S/p Rt THA Rationale for Evaluation and Treatment: Rehabilitation  THERAPY DIAG:  Pain in right hip  Difficulty in walking, not elsewhere classified  Chronic pain of left knee  Stiffness of left knee, not elsewhere classified  Muscle weakness (generalized)  Other abnormalities of gait and mobility  ONSET DATE: DOS 07/26/23   SUBJECTIVE:  SUBJECTIVE STATEMENT: Patient reports today her left knee has been very stiff and swollen.  She is also having minor right knee pain.  She does not remember doing any thing overly strenuous.   PERTINENT HISTORY:  none  PAIN:  Are you having pain? Yes: NPRS scale: knee 3/10 hip 0/10 Pain location: Rt laeral hip Pain description: tight, tender Aggravating factors: walking Relieving factors: rest  PRECAUTIONS:  Anterior hip  RED FLAGS: None   WEIGHT BEARING RESTRICTIONS:  No  FALLS:  Has patient fallen in last 6 months? No  LIVING ENVIRONMENT: 4 steps to get into home, bedroom on main floor, has a second story.  OCCUPATION:  Next week to return to work   PLOF:  Independent  PATIENT GOALS:  Don my own sock and shoes. Preserve left knee to avoid TKA, play pickle ball   OBJECTIVE:  Note: Objective measures were completed at Evaluation unless otherwise noted.  PATIENT SURVEYS:  LEFS 28/80 LEFS 7/8  47/80  COGNITIVE STATUS: Within functional limits for tasks assessed   SENSATION: WFL  EDEMA:  Yes: mild  POSTURE:  Lacking full hip extension bilaterally  GAIT: EVAL: antalgic lacking hip extension through stance phase/toe off   Body Part #1 Hip  PALPATION: Eval- edema and warmth  LOWER EXTREMITY ROM:     Active  Right eval Left eval Rt Lt  Standing 5/19 Right  6/6  Left 6/6 Left 8/5 Left 8/26 left Left  10/13 10/22  Hip flexion    105        Hip extension -12 -6 -7/-2         Hip abduction            Hip adduction            Hip internal rotation            Hip external rotation    40  95 98 101 103 106  Knee flexion            Knee extension 0 -8 -5  -5 -5 With poor -10 -8 -5   (Blank rows = not tested)     Gross strength   Left/ right  flexion 4+/4+ 8/26 flexion 5/5 Hip abduction 4+/4  8/26 5/4+ Knee extension 4+/4    8/26 5/4+            LOWER EXTREMITY MMT:    MMT Right 10/13  Left 10/13   Hip flexion 18.8 15.3  Hip extension    Hip abduction 17.7 15.1  Hip adduction    Hip internal rotation    Hip external rotation    Knee flexion    Knee extension 20.9 23.2  Ankle dorsiflexion    Ankle plantarflexion    Ankle inversion    Ankle eversion     (Blank rows = not tested)  TREATMENT DATE:  10/29 Manual: Grade 2 and 3 PA and AP mobilizations to reduce pain and improve mobility of the left knee.  Trigger point release to IT band.  Reviewed self trigger point release to IT band.   Self patellar mobilization Patella mobilization  Edema massage  There-ex:  SLR 2x12 each leg   SAQ 3x12 3lbs on the right no weight on the left  Quad set 3x12 Hip abduction green 3x12   10/24 Manual: Grade 2 and 3 PA and AP mobilizations to reduce pain and improve mobility of the left knee.  Trigger point release to IT band.  Reviewed self  trigger point release to IT band.   Self patellar mobilization Patella mobilization  Edema massage  There-ex:  SLR 2x12 each leg   SAQ 3x12 3lbs on the right no weight on the left   Neuromuscular reeducation: Heel toe rock 3 x 10  Slow march with weightbearing on right 3 x 10  10/22 Manual: Grade 2 and 3 PA and AP mobilizations to reduce pain and improve mobility of the left knee.  Trigger point release to IT band.  Reviewed self trigger point release to IT band.   Self patellar mobilization Patella mobilization  Edema massage   There-ex:  SLR 2x12 each leg   SAQ 3x12 3lbs on the right no weight on the left   There-act:  Standing weight shift 3x12  Step up 6 inch rright 2x10  4 inch left 2x10   Neuro-re-ed:  Step onto air-ex 2x10 fwd   10/17 Manual: Grade 2 and 3 PA and AP mobilizations to reduce pain and improve mobility of the left knee.  Trigger point release to IT band.  Reviewed self trigger point release to IT band.   Self patellar mobilization Patella mobilization  Edema massage   There-ex:  Quad set 3x12  SLR 3x12 each leg   10/13 Manual: Grade 2 and 3 PA and AP mobilizations to reduce pain and improve mobility of the left knee.  Significant improvement in flexion noted with manual therapy.  Trigger point release to IT band.  Reviewed self trigger point release to IT band.  Self patellar mobilization. More emphasis on flexion ROM today.  There-ex:  Nu-step L5 5 min  SLR 2x12 each leg  LAQ 3x12  SAQ 3x12 Quad set 3x12  Standing weight shift 3x12   Manual muscle testing an review of goals.   10/9 Manual: Grade 2 and 3 PA and AP mobilizations to reduce pain and improve mobility of the left knee.  Significant improvement in flexion noted with manual therapy.  Trigger point release to IT band.  Reviewed self trigger point release to IT band.  Self patellar mobilization. More emphasis on flexion ROM today.  There-ex:  Nu-step L5 5 min  SLR 2x12 each leg   LAQ 3x12  SAQ 3x12 Quad set 3x12  Standing weight shift 3x12   10/6   Manual: Grade 2 and 3 PA and AP mobilizations to reduce pain and improve mobility of the left knee.  Significant improvement in flexion noted with manual therapy.  Trigger point release to IT band.  Reviewed self trigger point release to IT band.  Self patellar mobilization. More emphasis on flexion ROM today.   There-ex:  Nu-step L5 5 min  SLR 2x12 each leg  LAQ 3x12  SAQ 2x20 2 lbs R L 1.5 bs   Step up  4 inch 2x10 fwd  4 inch lateral 2x10  10/1 Manual: Grade 2 and 3 PA and AP mobilizations to reduce pain and improve mobility of the left knee.  Significant improvement in flexion noted with manual therapy.  Trigger point release to IT band.  Reviewed self trigger point release to IT band.  Self patellar mobilization. More emphasis on flexion ROM today.  There-ex:  There-ex:  Nu-step L5 5 min  SLR 2x12 each leg  LAQ 3x12  SAQ 2x20 2 lbs R L 1.5 bs   Step up  4 inch 2x10 fwd  4 inch lateral 2x10     09/25 Manual: Grade 2 and 3 PA and AP mobilizations to reduce pain and improve mobility of the left knee.  Significant improvement in flexion noted with manual therapy.  Trigger point release to IT band.  Reviewed self trigger point release to IT band.  Self patellar mobilization. More emphasis on flexion ROM today.  There-ex:  Nu-step L5 5 min  SLR 3x12 each leg  LAQ 3x12   Neuro-re-ed:  Heel toe rock 3x12  Slow march with UE support 2x10     PATIENT EDUCATION:  Education details: Anatomy of condition, POC, HEP, exercise form/rationale Person educated: Patient Education method: Explanation, Demonstration, Tactile cues, Verbal cues, and Handouts Education comprehension: verbalized understanding, returned demonstration, verbal cues required, tactile cues required, and needs further education  HOME EXERCISE PROGRAM: Access Code: [email or web address] URL:  [email or web address]  ASSESSMENT:  CLINICAL IMPRESSION:  Patient continues to report stiffness and pain in her knee following treatment.  She has had improvement with manual therapy knee.  We worked on basic exercises.  Hip is progressing well cleared discharged to HEP next she will be seeing the MD soon.    SABRACLINICAL DECISION MAKING: Stable/uncomplicated  EVALUATION COMPLEXITY: Low   GOALS: Goals reviewed with patient? Yes  SHORT TERM GOALS: Target date: 01/21/2024    Sit<>stand without use of UEs Baseline: Goal status: Improving 10/14  2.  Standing hip hip to neutral extension Baseline:  Goal status: achieved 6/6    LONG TERM GOALS: Target date: 02/18/2024    Stand as long as necessary for work with minimal discomfort Baseline:  Goal status: achieved 8/5  2.  LEFS to improve by MDC x2 Baseline:  Goal status: progressing 10/14  3.  Hip abd strength to 80% of opposite LE Baseline:  Goal status: achieved 10/14 4.  Easily don socks and shoes independently Baseline:  Goal status: IN PROGRESS 8/5  5.  Begin training for return to pickle ball Baseline:  Goal status: has not returned 10/14     PLAN:  PT FREQUENCY: 1-2x/week  PT DURATION:8 weeks  PLANNED INTERVENTIONS: 97164- PT Re-evaluation, 97750- Physical Performance Testing, 97110-Therapeutic exercises, 97530- Therapeutic activity, 97112- Neuromuscular re-education, 97535- Self Care, 02859- Manual therapy, [phone]- Gait training, [phone]- Aquatic Therapy, Patient/Family education, Balance training, Stair training, Taping, Dry Needling, Joint mobilization, Spinal mobilization, Scar mobilization, Cryotherapy, and Moist heat.  PLAN FOR NEXT SESSION: continue flexibility and strength to reduce post pelvic tilt.  Alm Don PT DPT  02/27/24 11:25 AM    Referring diagnosis?  S03.358 (ICD-10-CM) - Status post total replacement of right hip    Treatment diagnosis? (if different than  referring diagnosis) M25.551 What was this (referring dx) caused by? [x]  Surgery []  Fall []  Ongoing issue []  Arthritis []  Other: ____________  Laterality: [x]  Rt []  Lt []  Both  Check all possible CPT codes:  *CHOOSE 10 OR LESS*    See Planned Interventions listed in the Plan section of the  Evaluation.

## 2024-02-28 ENCOUNTER — Other Ambulatory Visit (HOSPITAL_BASED_OUTPATIENT_CLINIC_OR_DEPARTMENT_OTHER): Payer: Self-pay

## 2024-02-28 ENCOUNTER — Ambulatory Visit (HOSPITAL_BASED_OUTPATIENT_CLINIC_OR_DEPARTMENT_OTHER): Admitting: Physical Therapy

## 2024-02-28 ENCOUNTER — Encounter (HOSPITAL_BASED_OUTPATIENT_CLINIC_OR_DEPARTMENT_OTHER): Payer: Self-pay | Admitting: Physical Therapy

## 2024-02-28 DIAGNOSIS — M6281 Muscle weakness (generalized): Secondary | ICD-10-CM | POA: Diagnosis not present

## 2024-02-28 DIAGNOSIS — M25551 Pain in right hip: Secondary | ICD-10-CM

## 2024-02-28 DIAGNOSIS — G8929 Other chronic pain: Secondary | ICD-10-CM

## 2024-02-28 DIAGNOSIS — M25562 Pain in left knee: Secondary | ICD-10-CM | POA: Diagnosis not present

## 2024-02-28 DIAGNOSIS — M25662 Stiffness of left knee, not elsewhere classified: Secondary | ICD-10-CM | POA: Diagnosis not present

## 2024-02-28 DIAGNOSIS — R2689 Other abnormalities of gait and mobility: Secondary | ICD-10-CM

## 2024-02-28 DIAGNOSIS — R262 Difficulty in walking, not elsewhere classified: Secondary | ICD-10-CM

## 2024-02-28 MED ORDER — FLUZONE HIGH-DOSE 0.5 ML IM SUSY
0.5000 mL | PREFILLED_SYRINGE | Freq: Once | INTRAMUSCULAR | 0 refills | Status: AC
  Filled 2024-02-28: qty 0.5, 1d supply, fill #0

## 2024-02-28 NOTE — Therapy (Signed)
 OUTPATIENT PHYSICAL THERAPY  Progress Note Reporting Period 10/06/2023 to 12/03/2023  See note below for Objective Data and Assessment of Progress/Goals.        Patient Name: Jaime Rose MRN: [id number] DOB:Mar 16, 1951, 73 y.o., female Today's Date: 02/28/2024  END OF SESSION:  PT End of Session - 02/28/24 1542     Visit Number 33    Number of Visits 41    Date for Recertification  04/06/24    Authorization Type 18 visits app 12-30-2023- 02-29-2024    PT Start Time 1515    PT Stop Time 1558    PT Time Calculation (min) 43 min    Activity Tolerance Patient tolerated treatment well    Behavior During Therapy [facility] for tasks assessed/performed                         Past Medical History:  Diagnosis Date   ALLERGIC RHINITIS    Allergy 1985   seasonal allergies   Arthritis    CHICKENPOX, HX OF 12/21/2009   Qualifier: Diagnosis of  By: Inocencio MD, [place] DELENA    Heart murmur    MR   Hyperthyroidism    normal TFTs 11/2009 , dx 10/2011   Pre-diabetes    Varicose vein    s/p sclerosis tx summer 2013   Past Surgical History:  Procedure Laterality Date   ENDOVENOUS ABLATION SAPHENOUS VEIN W/ LASER  04/11/2012   endovenous laser ablation right greater saphenous vein and stab phlebectomy  right leg  by Krystal Doing MD   NO PAST SURGERIES     TOTAL HIP ARTHROPLASTY Right 07/26/2023   Procedure: ARTHROPLASTY, HIP, TOTAL, ANTERIOR APPROACH;  Surgeon: Vernetta Lonni GRADE, MD;  Location: [facility];  Service: Orthopedics;  Laterality: Right;   Patient Active Problem List   Diagnosis Date Noted   Status post total replacement of right hip 07/26/2023   Hyperlipidemia 07/17/2023   Elevated coronary artery calcium  score 07/17/2023   Preoperative clearance 07/17/2023   Mitral regurgitation, moderate-possibly severe 07/11/2023   Vitamin D  deficiency 05/23/2023   Foot pain, left 06/02/2021   Ptosis of right eyelid 04/12/2020   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  12/29/2018   Osteopenia 01/10/2017   Carotid artery stenosis, asymptomatic, bilateral 12/21/2016   Prediabetes 12/21/2016   Hypothyroidism 03/20/2012   Varicose veins of bilateral lower extremities with other complications 12/21/2011   Allergic rhinitis 12/21/2009   Cardiac murmur 12/21/2009       REFERRING PROVIDER: Lonni Vernetta, MD  REFERRING DIAG:  [phone] (ICD-10-CM) - Status post total replacement of right hip     S/p Rt THA Rationale for Evaluation and Treatment: Rehabilitation  THERAPY DIAG:  Pain in right hip  Difficulty in walking, not elsewhere classified  Chronic pain of left knee  Stiffness of left knee, not elsewhere classified  Muscle weakness (generalized)  Other abnormalities of gait and mobility  ONSET DATE: DOS 07/26/23   SUBJECTIVE:  SUBJECTIVE STATEMENT: The patients knee feels better today. She is still stiff but it is better.   PERTINENT HISTORY:  none  PAIN:  Are you having pain? Yes: NPRS scale: knee 3/10 hip 0/10 Pain location: Rt laeral hip Pain description: tight, tender Aggravating factors: walking Relieving factors: rest  PRECAUTIONS:  Anterior hip  RED FLAGS: None   WEIGHT BEARING RESTRICTIONS:  No  FALLS:  Has patient fallen in last 6 months? No  LIVING ENVIRONMENT: 4 steps to get into home, bedroom on main floor, has a second story.  OCCUPATION:  Next week to return to work   PLOF:  Independent  PATIENT GOALS:  Don my own sock and shoes. Preserve left knee to avoid TKA, play pickle ball   OBJECTIVE:  Note: Objective measures were completed at Evaluation unless otherwise noted.  PATIENT SURVEYS:  LEFS 28/80 LEFS 7/8 47/80  COGNITIVE STATUS: Within functional limits for tasks  assessed   SENSATION: WFL  EDEMA:  Yes: mild  POSTURE:  Lacking full hip extension bilaterally  GAIT: EVAL: antalgic lacking hip extension through stance phase/toe off   Body Part #1 Hip  PALPATION: Eval- edema and warmth  LOWER EXTREMITY ROM:     Active  Right eval Left eval Rt Lt  Standing 5/19 Right  6/6  Left 6/6 Left 8/5 Left 8/26 left Left  10/13 10/22  Hip flexion    105        Hip extension -12 -6 -7/-2         Hip abduction            Hip adduction            Hip internal rotation            Hip external rotation    40  95 98 101 103 106  Knee flexion            Knee extension 0 -8 -5  -5 -5 With poor -10 -8 -5   (Blank rows = not tested)     Gross strength   Left/ right  flexion 4+/4+ 8/26 flexion 5/5 Hip abduction 4+/4  8/26 5/4+ Knee extension 4+/4    8/26 5/4+            LOWER EXTREMITY MMT:    MMT Right 10/13  Left 10/13   Hip flexion 18.8 15.3  Hip extension    Hip abduction 17.7 15.1  Hip adduction    Hip internal rotation    Hip external rotation    Knee flexion    Knee extension 20.9 23.2  Ankle dorsiflexion    Ankle plantarflexion    Ankle inversion    Ankle eversion     (Blank rows = not tested)                                                                                                                TREATMENT DATE:  10/31 Manual: Grade 2 and 3 PA and AP mobilizations to reduce pain and  improve mobility of the left knee.  Trigger point release to IT band.  Reviewed self trigger point release to IT band.   Self patellar mobilization Patella mobilization  Edema massage  There-ex:  SLR 2x12 each leg   SAQ 3x12 3lbs on the right no weight on the left   There-act:  Standing weight shift 3x12  Step up 6 inch rright 2x10  4 inch left 2x10   10/29 Manual: Grade 2 and 3 PA and AP mobilizations to reduce pain and improve mobility of the left knee.  Trigger point release to IT band.  Reviewed self trigger point  release to IT band.   Self patellar mobilization Patella mobilization  Edema massage  There-ex:  SLR 2x12 each leg   SAQ 3x12 3lbs on the right no weight on the left  Quad set 3x12 Hip abduction green 3x12   10/24 Manual: Grade 2 and 3 PA and AP mobilizations to reduce pain and improve mobility of the left knee.  Trigger point release to IT band.  Reviewed self trigger point release to IT band.   Self patellar mobilization Patella mobilization  Edema massage  There-ex:  SLR 2x12 each leg   SAQ 3x12 3lbs on the right no weight on the left   Neuromuscular reeducation: Heel toe rock 3 x 10  Slow march with weightbearing on right 3 x 10  10/22 Manual: Grade 2 and 3 PA and AP mobilizations to reduce pain and improve mobility of the left knee.  Trigger point release to IT band.  Reviewed self trigger point release to IT band.   Self patellar mobilization Patella mobilization  Edema massage   There-ex:  SLR 2x12 each leg   SAQ 3x12 3lbs on the right no weight on the left   There-act:  Standing weight shift 3x12  Step up 6 inch rright 2x10  4 inch left 2x10   Neuro-re-ed:  Step onto air-ex 2x10 fwd   10/17 Manual: Grade 2 and 3 PA and AP mobilizations to reduce pain and improve mobility of the left knee.  Trigger point release to IT band.  Reviewed self trigger point release to IT band.   Self patellar mobilization Patella mobilization  Edema massage   There-ex:  Quad set 3x12  SLR 3x12 each leg   10/13 Manual: Grade 2 and 3 PA and AP mobilizations to reduce pain and improve mobility of the left knee.  Significant improvement in flexion noted with manual therapy.  Trigger point release to IT band.  Reviewed self trigger point release to IT band.  Self patellar mobilization. More emphasis on flexion ROM today.  There-ex:  Nu-step L5 5 min  SLR 2x12 each leg  LAQ 3x12  SAQ 3x12 Quad set 3x12  Standing weight shift 3x12   Manual muscle testing an review of goals.    10/9 Manual: Grade 2 and 3 PA and AP mobilizations to reduce pain and improve mobility of the left knee.  Significant improvement in flexion noted with manual therapy.  Trigger point release to IT band.  Reviewed self trigger point release to IT band.  Self patellar mobilization. More emphasis on flexion ROM today.  There-ex:  Nu-step L5 5 min  SLR 2x12 each leg  LAQ 3x12  SAQ 3x12 Quad set 3x12  Standing weight shift 3x12     PATIENT EDUCATION:  Education details: Anatomy of condition, POC, HEP, exercise form/rationale Person educated: Patient Education method: Explanation, Demonstration, Tactile cues, Verbal cues, and Handouts Education  comprehension: verbalized understanding, returned demonstration, verbal cues required, tactile cues required, and needs further education  HOME EXERCISE PROGRAM: Access Code: [email or web address] URL: [email or web address]  ASSESSMENT:  CLINICAL IMPRESSION:  The patient has reached max potential for physical therapy at this time.  Her hip has progressed well.  She shows good functional mobility with her hip.  She is able to go up and down stairs with her right hip.  She continues to be limited by her left knee.  She has had improvement with physical therapy to her left knee.  She has had improvements in range of motion which has helped her gait and her progression with her right hip.  She still has significant limitations in range of motion and increased pain with increased activity in the left knee.  She plans in having this checked out in November.  Will discharge her from physical therapy at this time.   SABRACLINICAL DECISION MAKING: Stable/uncomplicated  EVALUATION COMPLEXITY: Low   GOALS: Goals reviewed with patient? Yes  SHORT TERM GOALS: Target date: 01/21/2024    Sit<>stand without use of UEs Baseline: Goal status: Does not have to use hands 10/31 achieved  2.  Standing hip hip to neutral extension Baseline:  Goal status:  achieved 6/6    LONG TERM GOALS: Target date: 02/18/2024    Stand as long as necessary for work with minimal discomfort Baseline:  Goal status: achieved 8/5  2.  LEFS to improve by MDC x2 Baseline:  Goal status: Achieved  3.  Hip abd strength to 80% of opposite LE Baseline:  Goal status: achieved 10/31 4.  Easily don socks and shoes independently Baseline:  Goal status: IN achieved 10/31  5.  Begin training for return to pickle ball Baseline:  Goal status: has not limited by knee 10/31     PLAN:  PT FREQUENCY: 1-2x/week  PT DURATION:8 weeks  PLANNED INTERVENTIONS: 97164- PT Re-evaluation, 97750- Physical Performance Testing, 97110-Therapeutic exercises, 97530- Therapeutic activity, 97112- Neuromuscular re-education, 97535- Self Care, 02859- Manual therapy, [phone]- Gait training, [phone]- Aquatic Therapy, Patient/Family education, Balance training, Stair training, Taping, Dry Needling, Joint mobilization, Spinal mobilization, Scar mobilization, Cryotherapy, and Moist heat.  PLAN FOR NEXT SESSION: continue flexibility and strength to reduce post pelvic tilt.  Alm Don PT DPT  02/28/24 3:53 PM    Referring diagnosis?  S03.358 (ICD-10-CM) - Status post total replacement of right hip    Treatment diagnosis? (if different than referring diagnosis) M25.551 What was this (referring dx) caused by? [x]  Surgery []  Fall []  Ongoing issue []  Arthritis []  Other: ____________  Laterality: [x]  Rt []  Lt []  Both  Check all possible CPT codes:  *CHOOSE 10 OR LESS*    See Planned Interventions listed in the Plan section of the Evaluation.

## 2024-03-02 ENCOUNTER — Encounter: Payer: Self-pay | Admitting: Radiology

## 2024-03-02 DIAGNOSIS — H16223 Keratoconjunctivitis sicca, not specified as Sjogren's, bilateral: Secondary | ICD-10-CM | POA: Diagnosis not present

## 2024-03-02 DIAGNOSIS — H40022 Open angle with borderline findings, high risk, left eye: Secondary | ICD-10-CM | POA: Diagnosis not present

## 2024-03-02 DIAGNOSIS — H40011 Open angle with borderline findings, low risk, right eye: Secondary | ICD-10-CM | POA: Diagnosis not present

## 2024-03-09 ENCOUNTER — Ambulatory Visit: Admitting: Orthopaedic Surgery

## 2024-03-12 ENCOUNTER — Ambulatory Visit: Admitting: Orthopaedic Surgery

## 2024-03-12 ENCOUNTER — Other Ambulatory Visit (INDEPENDENT_AMBULATORY_CARE_PROVIDER_SITE_OTHER): Payer: Self-pay

## 2024-03-12 DIAGNOSIS — G8929 Other chronic pain: Secondary | ICD-10-CM

## 2024-03-12 DIAGNOSIS — M25561 Pain in right knee: Secondary | ICD-10-CM | POA: Diagnosis not present

## 2024-03-12 DIAGNOSIS — Z96641 Presence of right artificial hip joint: Secondary | ICD-10-CM | POA: Diagnosis not present

## 2024-03-12 DIAGNOSIS — M1712 Unilateral primary osteoarthritis, left knee: Secondary | ICD-10-CM | POA: Diagnosis not present

## 2024-03-12 DIAGNOSIS — M1711 Unilateral primary osteoarthritis, right knee: Secondary | ICD-10-CM | POA: Diagnosis not present

## 2024-03-12 NOTE — Progress Notes (Signed)
 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  She is a very active 73 year old female.  She said the right hip is really doing well.  She has been dealing with knee pain with both of her knees.  We have x-rayed her left knee before and she would like to have right knee x-rays today.  On exam her right operative hip moves smoothly and fluidly.  There is no blocks rotation and no pain.  An AP pelvis and lateral of that right hip shows a well-seated right total hip arthroplasty with no complicating features in the left hip joint space is well-maintained with her native hip.  Examination of both knees shows medial joint line tenderness in the right knee and lateral joint tenderness of the left knee with valgus malalignment of the left knee and varus malalignment of the right knee.  X-rays of the right knee also show the left knee standing.  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  She is going through a soft tissue treatment now through a chiropractor to see if this would help with her knees and she is making some progress with that so we are holding off on any other intervention for now as a relates to her knees.  From our standpoint it would be steroid injections versus knee replacement surgery.  The joint space is too narrow for considering hyaluronic acid injections.  She will still work on quad strengthening exercises as well.  All question concerns were answered addressed.  For now follow-up can be as needed.

## 2024-04-09 ENCOUNTER — Encounter: Payer: Self-pay | Admitting: Orthopaedic Surgery

## 2024-04-10 ENCOUNTER — Other Ambulatory Visit: Payer: Self-pay

## 2024-04-10 DIAGNOSIS — M1712 Unilateral primary osteoarthritis, left knee: Secondary | ICD-10-CM

## 2024-04-11 ENCOUNTER — Other Ambulatory Visit: Payer: Self-pay | Admitting: Internal Medicine

## 2024-04-15 ENCOUNTER — Ambulatory Visit (HOSPITAL_BASED_OUTPATIENT_CLINIC_OR_DEPARTMENT_OTHER): Attending: Orthopaedic Surgery | Admitting: Physical Therapy

## 2024-04-15 DIAGNOSIS — R262 Difficulty in walking, not elsewhere classified: Secondary | ICD-10-CM | POA: Insufficient documentation

## 2024-04-15 DIAGNOSIS — M25662 Stiffness of left knee, not elsewhere classified: Secondary | ICD-10-CM | POA: Insufficient documentation

## 2024-04-15 DIAGNOSIS — G8929 Other chronic pain: Secondary | ICD-10-CM | POA: Insufficient documentation

## 2024-04-15 DIAGNOSIS — M1712 Unilateral primary osteoarthritis, left knee: Secondary | ICD-10-CM | POA: Insufficient documentation

## 2024-04-15 DIAGNOSIS — R2689 Other abnormalities of gait and mobility: Secondary | ICD-10-CM | POA: Insufficient documentation

## 2024-04-15 DIAGNOSIS — M6281 Muscle weakness (generalized): Secondary | ICD-10-CM | POA: Insufficient documentation

## 2024-04-15 DIAGNOSIS — M25562 Pain in left knee: Secondary | ICD-10-CM | POA: Diagnosis present

## 2024-04-15 NOTE — Therapy (Signed)
 OUTPATIENT PHYSICAL THERAPY  Progress Note Reporting Period 10/06/2023 to 12/03/2023  See note below for Objective Data and Assessment of Progress/Goals.        Patient Name: Jaime Rose MRN: [id number] DOB:1951/03/18, 73 y.o., female Today's Date: 04/16/2024  END OF SESSION:  PT End of Session - 04/16/24 1340     Visit Number 1    Number of Visits 16    Date for Recertification  06/11/24    PT Start Time 1600    PT Stop Time 1642    PT Time Calculation (min) 42 min    Activity Tolerance Patient tolerated treatment well    Behavior During Therapy [facility] for tasks assessed/performed                          Past Medical History:  Diagnosis Date   ALLERGIC RHINITIS    Allergy 1985   seasonal allergies   Arthritis    CHICKENPOX, HX OF 12/21/2009   Qualifier: Diagnosis of  By: Inocencio MD, Berwyn DELENA    Heart murmur    MR   Hyperthyroidism    normal TFTs 11/2009 , dx 10/2011   Pre-diabetes    Varicose vein    s/p sclerosis tx summer 2013   Past Surgical History:  Procedure Laterality Date   ENDOVENOUS ABLATION SAPHENOUS VEIN W/ LASER  04/11/2012   endovenous laser ablation right greater saphenous vein and stab phlebectomy  right leg  by Krystal Doing MD   NO PAST SURGERIES     TOTAL HIP ARTHROPLASTY Right 07/26/2023   Procedure: ARTHROPLASTY, HIP, TOTAL, ANTERIOR APPROACH;  Surgeon: Vernetta Lonni GRADE, MD;  Location: [facility];  Service: Orthopedics;  Laterality: Right;   Patient Active Problem List   Diagnosis Date Noted   Unilateral primary osteoarthritis, right knee 03/12/2024   Unilateral primary osteoarthritis, left knee 03/12/2024   Status post total replacement of right hip 07/26/2023   Hyperlipidemia 07/17/2023   Elevated coronary artery calcium  score 07/17/2023   Preoperative clearance 07/17/2023   Mitral regurgitation, moderate-possibly severe 07/11/2023   Vitamin D  deficiency 05/23/2023   Foot pain, left 06/02/2021   Ptosis of  right eyelid 04/12/2020   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 12/29/2018   Osteopenia 01/10/2017   Carotid artery stenosis, asymptomatic, bilateral 12/21/2016   Prediabetes 12/21/2016   Hypothyroidism 03/20/2012   Varicose veins of bilateral lower extremities with other complications 12/21/2011   Allergic rhinitis 12/21/2009   Cardiac murmur 12/21/2009       REFERRING PROVIDER: Lonni Vernetta, MD  REFERRING DIAG:  [phone] (ICD-10-CM) - Status post total replacement of right hip     S/p Rt THA Rationale for Evaluation and Treatment: Rehabilitation  THERAPY DIAG:  Difficulty in walking, not elsewhere classified  Chronic pain of left knee  Stiffness of left knee, not elsewhere classified  Muscle weakness (generalized)  Other abnormalities of gait and mobility  ONSET DATE: DOS 07/26/23   SUBJECTIVE:  SUBJECTIVE STATEMENT: Patient returns to therapy with a new prescription for her knee.  She reports she was doing fairly well but had an acute onset of significant knee pain towards the beginning of December.  She reports increased difficulty walking.  She reports the stiffness is increased since that period of time.  She has tried a things given to her from physical therapy and has seen no significant reduction in pain.  She continues to try to move is much as possible.  The patient reports that her hip is doing well.  PERTINENT HISTORY:  none  PAIN:  Are you having pain? Yes: NPRS scale: knee 8/10 Pain location: Rt laeral hip Pain description: tight, tender Aggravating factors: walking Relieving factors: rest  PRECAUTIONS:  Anterior hip  RED FLAGS: None   WEIGHT BEARING RESTRICTIONS:  No  FALLS:  Has patient fallen in last 6 months? No  LIVING ENVIRONMENT: 4 steps to get into  home, bedroom on main floor, has a second story.  OCCUPATION:  Next week to return to work   PLOF:  Independent  PATIENT GOALS:  Don my own sock and shoes. Preserve left knee to avoid TKA, play pickle ball   OBJECTIVE:  Note: Objective measures were completed at Evaluation unless otherwise noted.  PATIENT SURVEYS:  LEFS 28/80 LEFS 7/8 47/80 LEFS 12/17 15/80   COGNITIVE STATUS: Within functional limits for tasks assessed   SENSATION: WFL  EDEMA:  Yes: mild  POSTURE:  Lacking full hip extension bilaterally  GAIT: EVAL: antalgic lacking hip extension through stance phase/toe off   Body Part #1 Hip  PALPATION: Eval- edema and warmth  LOWER EXTREMITY ROM:     Active  Right eval Left eval Left 6/6 Left 8/5 Left  10/13 10/22 Left 12/17  Hip flexion         Hip extension -12 -6       Hip abduction         Hip adduction         Hip internal rotation         Hip external rotation    95 103 106 62  Knee flexion         Knee extension 0 -8 -5 -5 -8 -5 -20    (Blank rows = not tested)     Gross strength   Left/ right  flexion 4+/4+ 8/26 flexion 5/5 Hip abduction 4+/4  8/26 5/4+ Knee extension 4+/4    8/26 5/4+       Strength testing not performed psych secondary to increased pain     Re-eval: Unable to assess 2nd to high levesl of pain     LOWER EXTREMITY MMT:    MMT Right 10/13  Left 10/13  Left   Hip flexion 18.8 15.3   Hip extension     Hip abduction 17.7 15.1   Hip adduction     Hip internal rotation     Hip external rotation     Knee flexion     Knee extension 20.9 23.2   Ankle dorsiflexion     Ankle plantarflexion     Ankle inversion     Ankle eversion      (Blank rows = not tested) not tested secondary to increased pain  TREATMENT DATE:  12/17 Manual: Grade 2 and 3 PA and AP mobilizations to reduce pain and improve  mobility of the left knee.  Trigger point release to IT band.  Reviewed self trigger point release to IT band.   Self patellar mobilization Patella mobilization  Edema massage  Vaso Pneumatic medium 15 minutes 32 degrees   There-ex:  Quad sets 3x10  Reviewed use of the nu-step in a few days      10/31 Manual: Grade 2 and 3 PA and AP mobilizations to reduce pain and improve mobility of the left knee.  Trigger point release to IT band.  Reviewed self trigger point release to IT band.   Self patellar mobilization Patella mobilization  Edema massage  There-ex:  SLR 2x12 each leg   SAQ 3x12 3lbs on the right no weight on the left   There-act:  Standing weight shift 3x12  Step up 6 inch rright 2x10  4 inch left 2x10   10/29 Manual: Grade 2 and 3 PA and AP mobilizations to reduce pain and improve mobility of the left knee.  Trigger point release to IT band.  Reviewed self trigger point release to IT band.   Self patellar mobilization Patella mobilization  Edema massage  There-ex:  SLR 2x12 each leg   SAQ 3x12 3lbs on the right no weight on the left  Quad set 3x12 Hip abduction green 3x12   10/24 Manual: Grade 2 and 3 PA and AP mobilizations to reduce pain and improve mobility of the left knee.  Trigger point release to IT band.  Reviewed self trigger point release to IT band.   Self patellar mobilization Patella mobilization  Edema massage  There-ex:  SLR 2x12 each leg   SAQ 3x12 3lbs on the right no weight on the left   Neuromuscular reeducation: Heel toe rock 3 x 10  Slow march with weightbearing on right 3 x 10  10/22 Manual: Grade 2 and 3 PA and AP mobilizations to reduce pain and improve mobility of the left knee.  Trigger point release to IT band.  Reviewed self trigger point release to IT band.   Self patellar mobilization Patella mobilization  Edema massage   There-ex:  SLR 2x12 each leg   SAQ 3x12 3lbs on the right no weight on the left   There-act:   Standing weight shift 3x12  Step up 6 inch rright 2x10  4 inch left 2x10   Neuro-re-ed:  Step onto air-ex 2x10 fwd   10/17 Manual: Grade 2 and 3 PA and AP mobilizations to reduce pain and improve mobility of the left knee.  Trigger point release to IT band.  Reviewed self trigger point release to IT band.   Self patellar mobilization Patella mobilization  Edema massage   There-ex:  Quad set 3x12  SLR 3x12 each leg   10/13 Manual: Grade 2 and 3 PA and AP mobilizations to reduce pain and improve mobility of the left knee.  Significant improvement in flexion noted with manual therapy.  Trigger point release to IT band.  Reviewed self trigger point release to IT band.  Self patellar mobilization. More emphasis on flexion ROM today.  There-ex:  Nu-step L5 5 min  SLR 2x12 each leg  LAQ 3x12  SAQ 3x12 Quad set 3x12  Standing weight shift 3x12   Manual muscle testing an review of goals.   10/9 Manual: Grade 2 and 3 PA and AP mobilizations to reduce pain and improve mobility of the  left knee.  Significant improvement in flexion noted with manual therapy.  Trigger point release to IT band.  Reviewed self trigger point release to IT band.  Self patellar mobilization. More emphasis on flexion ROM today.  There-ex:  Nu-step L5 5 min  SLR 2x12 each leg  LAQ 3x12  SAQ 3x12 Quad set 3x12  Standing weight shift 3x12     PATIENT EDUCATION:  Education details: Anatomy of condition, POC, HEP, exercise form/rationale Person educated: Patient Education method: Explanation, Demonstration, Tactile cues, Verbal cues, and Handouts Education comprehension: verbalized understanding, returned demonstration, verbal cues required, tactile cues required, and needs further education  HOME EXERCISE PROGRAM: Access Code: [email or web address] URL: [email or web address]  ASSESSMENT:  CLINICAL IMPRESSION:  The patient returns to physical therapy following an acute onset of left knee pain.  She  has been able to do her exercises to reduce pain.  Therapy focused manual therapy to reduce inflammation she has some difficulty even tolerating manual therapy today.  She did report mild improvement in pain and motion following physical therapy.  We reviewed light exercises today to begin trying.  She was also advised that she can move her knee on something like the NuStep in a pain-free range that might be helpful.  She would benefit from skilled therapy to improve range of motion, general functional mobility, and reduce strength.  Therapy trialed vasopneumatic icing today.  She did not like vasopneumatic device.  We will not perform again in the future .CLINICAL DECISION MAKING: Stable/uncomplicated  EVALUATION COMPLEXITY: Low   GOALS: Goals reviewed with patient? Yes  SHORT TERM GOALS: Target date: 01/21/2024    Patient will increase left knee flexion by 30 degrees baseline: Goal status: Initial  2.  Patient will increase passive'knee extension 12 degrees Baseline:  Goal status:  3.  Patient will ambulate 500 feet without significant increase in left knee pain    LONG TERM GOALS: Target date: 02/18/2024    Stand as long as necessary for work with minimal discomfort Baseline:  Goal status: Initial  2.  LEFS to improve by MDC x2 Baseline:  Goal status: Initial 3.  Patient will go up and down 8 steps with reciprocal gait pattern without significant increase in left knee pain Goal status: Initial 4.  Easily don socks and shoes independently Baseline:  Goal status: Initial       PLAN:  PT FREQUENCY: 1-2x/week  PT DURATION:8 weeks  PLANNED INTERVENTIONS: 97164- PT Re-evaluation, 97750- Physical Performance Testing, 97110-Therapeutic exercises, 97530- Therapeutic activity, [id number]- Neuromuscular re-education, 97535- Self Care, [id number]- Manual therapy, [phone]- Gait training, [phone]- Aquatic Therapy, Patient/Family education, Balance training, Stair training, Taping, Dry  Needling, Joint mobilization, Spinal mobilization, Scar mobilization, Cryotherapy, and Moist heat.  PLAN FOR NEXT SESSION: Continue with manual therapy to reduce acute inflammation and improve range of motion.  Progress back and exercises as tolerated. Alm Don PT DPT  04/16/2024 1:47 PM    Referring diagnosis?  S03.358 (ICD-10-CM) - Status post total replacement of right hip    Treatment diagnosis? (if different than referring diagnosis) M25.551 What was this (referring dx) caused by? [x]  Surgery []  Fall []  Ongoing issue []  Arthritis []  Other: ____________  Laterality: [x]  Rt []  Lt []  Both  Check all possible CPT codes:  *CHOOSE 10 OR LESS*    See Planned Interventions listed in the Plan section of the Evaluation.

## 2024-04-16 ENCOUNTER — Telehealth: Payer: Self-pay

## 2024-04-16 NOTE — Telephone Encounter (Signed)
 Copied from CRM #[id number]. Topic: Clinical - Prescription Issue >> Apr 16, 2024 12:59 PM Chasity T wrote: Reason for CRM: Jolan from [employer] is calling to request dr burns okay to switch manufactures because they no longer carrie levothyroxine  (SYNTHROID ) 112 MCG tablet-. Patient only has 3 pills left. She just needs written consent for it to be okay.

## 2024-04-16 NOTE — Telephone Encounter (Signed)
 Yes, ok

## 2024-04-17 NOTE — Telephone Encounter (Signed)
 Contacted pharmacy and update changed.

## 2024-04-22 ENCOUNTER — Encounter (HOSPITAL_BASED_OUTPATIENT_CLINIC_OR_DEPARTMENT_OTHER): Payer: Self-pay | Admitting: Physical Therapy

## 2024-04-22 ENCOUNTER — Ambulatory Visit (HOSPITAL_BASED_OUTPATIENT_CLINIC_OR_DEPARTMENT_OTHER): Admitting: Physical Therapy

## 2024-04-22 DIAGNOSIS — R262 Difficulty in walking, not elsewhere classified: Secondary | ICD-10-CM | POA: Diagnosis not present

## 2024-04-22 DIAGNOSIS — M25662 Stiffness of left knee, not elsewhere classified: Secondary | ICD-10-CM

## 2024-04-22 DIAGNOSIS — G8929 Other chronic pain: Secondary | ICD-10-CM

## 2024-04-22 NOTE — Therapy (Signed)
 " OUTPATIENT PHYSICAL THERAPY  Progress Note Reporting Period 10/06/2023 to 12/03/2023  See note below for Objective Data and Assessment of Progress/Goals.        Patient Name: Jaime Rose MRN: [id number] DOB:July 27, 1950, 73 y.o., female Today's Date: 04/24/2024  END OF SESSION:  PT End of Session - 04/24/24 0938     Visit Number 2    Number of Visits 16    Date for Recertification  06/11/24    Authorization Type 18 visits app 12-30-2023- 02-29-2024    PT Start Time 1100    PT Stop Time 1139    PT Time Calculation (min) 39 min    Activity Tolerance Patient tolerated treatment well    Behavior During Therapy [facility] for tasks assessed/performed                           Past Medical History:  Diagnosis Date   ALLERGIC RHINITIS    Allergy 1985   seasonal allergies   Arthritis    CHICKENPOX, HX OF 12/21/2009   Qualifier: Diagnosis of  By: Inocencio MD, Berwyn DELENA    Heart murmur    MR   Hyperthyroidism    normal TFTs 11/2009 , dx 10/2011   Pre-diabetes    Varicose vein    s/p sclerosis tx summer 2013   Past Surgical History:  Procedure Laterality Date   ENDOVENOUS ABLATION SAPHENOUS VEIN W/ LASER  04/11/2012   endovenous laser ablation right greater saphenous vein and stab phlebectomy  right leg  by Krystal Doing MD   NO PAST SURGERIES     TOTAL HIP ARTHROPLASTY Right 07/26/2023   Procedure: ARTHROPLASTY, HIP, TOTAL, ANTERIOR APPROACH;  Surgeon: Vernetta Lonni GRADE, MD;  Location: [facility];  Service: Orthopedics;  Laterality: Right;   Patient Active Problem List   Diagnosis Date Noted   Unilateral primary osteoarthritis, right knee 03/12/2024   Unilateral primary osteoarthritis, left knee 03/12/2024   Status post total replacement of right hip 07/26/2023   Hyperlipidemia 07/17/2023   Elevated coronary artery calcium  score 07/17/2023   Preoperative clearance 07/17/2023   Mitral regurgitation, moderate-possibly severe 07/11/2023   Vitamin D   deficiency 05/23/2023   Foot pain, left 06/02/2021   Ptosis of right eyelid 04/12/2020   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 12/29/2018   Osteopenia 01/10/2017   Carotid artery stenosis, asymptomatic, bilateral 12/21/2016   Prediabetes 12/21/2016   Hypothyroidism 03/20/2012   Varicose veins of bilateral lower extremities with other complications 12/21/2011   Allergic rhinitis 12/21/2009   Cardiac murmur 12/21/2009       REFERRING PROVIDER: Lonni Vernetta, MD  REFERRING DIAG:  [phone] (ICD-10-CM) - Status post total replacement of right hip     S/p Rt THA Rationale for Evaluation and Treatment: Rehabilitation  THERAPY DIAG:  Chronic pain of left knee  Difficulty in walking, not elsewhere classified  Stiffness of left knee, not elsewhere classified  ONSET DATE: DOS 07/26/23   SUBJECTIVE:  SUBJECTIVE STATEMENT: The patient was good yesterday but today she is in significant pain. She is having difficulty walking.    Eval:Patient returns to therapy with a new prescription for her knee.  She reports she was doing fairly well but had an acute onset of significant knee pain towards the beginning of December.  She reports increased difficulty walking.  She reports the stiffness is increased since that period of time.  She has tried a things given to her from physical therapy and has seen no significant reduction in pain.  She continues to try to move is much as possible.  The patient reports that her hip is doing well.  PERTINENT HISTORY:  none  PAIN:  Are you having pain? Yes: NPRS scale: knee 8/10 Pain location: Rt laeral hip Pain description: tight, tender Aggravating factors: walking Relieving factors: rest  PRECAUTIONS:  Anterior hip  RED FLAGS: None   WEIGHT BEARING RESTRICTIONS:   No  FALLS:  Has patient fallen in last 6 months? No  LIVING ENVIRONMENT: 4 steps to get into home, bedroom on main floor, has a second story.  OCCUPATION:  Next week to return to work   PLOF:  Independent  PATIENT GOALS:  Don my own sock and shoes. Preserve left knee to avoid TKA, play pickle ball   OBJECTIVE:  Note: Objective measures were completed at Evaluation unless otherwise noted.  PATIENT SURVEYS:  LEFS 28/80 LEFS 7/8 47/80 LEFS 12/17 15/80   COGNITIVE STATUS: Within functional limits for tasks assessed   SENSATION: WFL  EDEMA:  Yes: mild  POSTURE:  Lacking full hip extension bilaterally  GAIT: EVAL: antalgic lacking hip extension through stance phase/toe off   Body Part #1 Hip  PALPATION: Eval- edema and warmth  LOWER EXTREMITY ROM:     Active  Right eval Left eval Left 6/6 Left 8/5 Left  10/13 10/22 Left 12/17  Hip flexion         Hip extension -12 -6       Hip abduction         Hip adduction         Hip internal rotation         Hip external rotation    95 103 106 62  Knee flexion         Knee extension 0 -8 -5 -5 -8 -5 -20    (Blank rows = not tested)     Gross strength   Left/ right  flexion 4+/4+ 8/26 flexion 5/5 Hip abduction 4+/4  8/26 5/4+ Knee extension 4+/4    8/26 5/4+       Strength testing not performed psych secondary to increased pain     Re-eval: Unable to assess 2nd to high levesl of pain     LOWER EXTREMITY MMT:    MMT Right 10/13  Left 10/13  Left   Hip flexion 18.8 15.3   Hip extension     Hip abduction 17.7 15.1   Hip adduction     Hip internal rotation     Hip external rotation     Knee flexion     Knee extension 20.9 23.2   Ankle dorsiflexion     Ankle plantarflexion     Ankle inversion     Ankle eversion      (Blank rows = not tested) not tested secondary to increased pain  TREATMENT DATE:  12/24 Manual: Grade 2 and 3 PA and AP mobilizations to reduce pain and improve mobility of the left knee.  Trigger point release to IT band.  Reviewed self trigger point release to IT band.   Self patellar mobilization Patella mobilization  Edema massage Mulligan mobilization for flexion in seated position  There-ex: nu-step 5 min   12/17 Manual: Grade 2 and 3 PA and AP mobilizations to reduce pain and improve mobility of the left knee.  Trigger point release to IT band.  Reviewed self trigger point release to IT band.   Self patellar mobilization Patella mobilization  Edema massage  Vaso Pneumatic medium 15 minutes 32 degrees   There-ex:  Quad sets 3x10  Reviewed use of the nu-step in a few days      10/31 Manual: Grade 2 and 3 PA and AP mobilizations to reduce pain and improve mobility of the left knee.  Trigger point release to IT band.  Reviewed self trigger point release to IT band.   Self patellar mobilization Patella mobilization  Edema massage  There-ex:  SLR 2x12 each leg   SAQ 3x12 3lbs on the right no weight on the left   There-act:  Standing weight shift 3x12  Step up 6 inch rright 2x10  4 inch left 2x10   10/29 Manual: Grade 2 and 3 PA and AP mobilizations to reduce pain and improve mobility of the left knee.  Trigger point release to IT band.  Reviewed self trigger point release to IT band.   Self patellar mobilization Patella mobilization  Edema massage  There-ex:  SLR 2x12 each leg   SAQ 3x12 3lbs on the right no weight on the left  Quad set 3x12 Hip abduction green 3x12   10/24 Manual: Grade 2 and 3 PA and AP mobilizations to reduce pain and improve mobility of the left knee.  Trigger point release to IT band.  Reviewed self trigger point release to IT band.   Self patellar mobilization Patella mobilization  Edema massage  There-ex:  SLR 2x12 each leg   SAQ 3x12 3lbs on the right no weight on the left   Neuromuscular  reeducation: Heel toe rock 3 x 10  Slow march with weightbearing on right 3 x 10  10/22 Manual: Grade 2 and 3 PA and AP mobilizations to reduce pain and improve mobility of the left knee.  Trigger point release to IT band.  Reviewed self trigger point release to IT band.   Self patellar mobilization Patella mobilization  Edema massage   There-ex:  SLR 2x12 each leg   SAQ 3x12 3lbs on the right no weight on the left   There-act:  Standing weight shift 3x12  Step up 6 inch rright 2x10  4 inch left 2x10   Neuro-re-ed:  Step onto air-ex 2x10 fwd   10/17 Manual: Grade 2 and 3 PA and AP mobilizations to reduce pain and improve mobility of the left knee.  Trigger point release to IT band.  Reviewed self trigger point release to IT band.   Self patellar mobilization Patella mobilization  Edema massage   There-ex:  Quad set 3x12  SLR 3x12 each leg   10/13 Manual: Grade 2 and 3 PA and AP mobilizations to reduce pain and improve mobility of the left knee.  Significant improvement in flexion noted with manual therapy.  Trigger point release to IT band.  Reviewed self trigger point release to IT band.  Self patellar mobilization. More emphasis  on flexion ROM today.  There-ex:  Nu-step L5 5 min  SLR 2x12 each leg  LAQ 3x12  SAQ 3x12 Quad set 3x12  Standing weight shift 3x12   Manual muscle testing an review of goals.   10/9 Manual: Grade 2 and 3 PA and AP mobilizations to reduce pain and improve mobility of the left knee.  Significant improvement in flexion noted with manual therapy.  Trigger point release to IT band.  Reviewed self trigger point release to IT band.  Self patellar mobilization. More emphasis on flexion ROM today.  There-ex:  Nu-step L5 5 min  SLR 2x12 each leg  LAQ 3x12  SAQ 3x12 Quad set 3x12  Standing weight shift 3x12     PATIENT EDUCATION:  Education details: Anatomy of condition, POC, HEP, exercise form/rationale Person educated: Patient Education  method: Explanation, Demonstration, Tactile cues, Verbal cues, and Handouts Education comprehension: verbalized understanding, returned demonstration, verbal cues required, tactile cues required, and needs further education  HOME EXERCISE PROGRAM: Access Code: [email or web address] URL: [email or web address]  ASSESSMENT:  CLINICAL IMPRESSION: Patient came in with her knee significantly inflamed today.  She is having difficulty ambulating.  She is having significant stiffness in her knee.  Therapy added in her Arliss mobilization today.  She tolerated well.  She reported feeling improved movement following therapy.  Her knee continues to have significant crepitus.  She plans on getting a hold of the MD and likely proceeding with a total knee replacement.   Eval: The patient returns to physical therapy following an acute onset of left knee pain.  She has been able to do her exercises to reduce pain.  Therapy focused manual therapy to reduce inflammation she has some difficulty even tolerating manual therapy today.  She did report mild improvement in pain and motion following physical therapy.  We reviewed light exercises today to begin trying.  She was also advised that she can move her knee on something like the NuStep in a pain-free range that might be helpful.  She would benefit from skilled therapy to improve range of motion, general functional mobility, and reduce strength.  Therapy trialed vasopneumatic icing today.  She did not like vasopneumatic device.  We will not perform again in the future .CLINICAL DECISION MAKING: Stable/uncomplicated  EVALUATION COMPLEXITY: Low   GOALS: Goals reviewed with patient? Yes  SHORT TERM GOALS: Target date: 01/21/2024    Patient will increase left knee flexion by 30 degrees baseline: Goal status: more limited today 12/24  2.  Patient will increase passive'knee extension 12 degrees Baseline:  Goal status:  3.  Patient will ambulate 500 feet  without significant increase in left knee pain    LONG TERM GOALS: Target date: 02/18/2024    Stand as long as necessary for work with minimal discomfort Baseline:  Goal status: Initial  2.  LEFS to improve by MDC x2 Baseline:  Goal status: Initial 3.  Patient will go up and down 8 steps with reciprocal gait pattern without significant increase in left knee pain Goal status: Initial 4.  Easily don socks and shoes independently Baseline:  Goal status: Initial       PLAN:  PT FREQUENCY: 1-2x/week  PT DURATION:8 weeks  PLANNED INTERVENTIONS: 97164- PT Re-evaluation, 97750- Physical Performance Testing, 97110-Therapeutic exercises, 97530- Therapeutic activity, [id number]- Neuromuscular re-education, 97535- Self Care, [id number]- Manual therapy, [phone]- Gait training, [phone]- Aquatic Therapy, Patient/Family education, Balance training, Stair training, Taping, Dry Needling, Joint mobilization, Spinal mobilization, Scar mobilization, Cryotherapy, and Moist heat.  PLAN  FOR NEXT SESSION: Continue with manual therapy to reduce acute inflammation and improve range of motion.  Progress back and exercises as tolerated. Alm Don PT DPT  04/24/2024 9:39 AM    Referring diagnosis?  S03.358 (ICD-10-CM) - Status post total replacement of right hip    Treatment diagnosis? (if different than referring diagnosis) M25.551 What was this (referring dx) caused by? [x]  Surgery []  Fall []  Ongoing issue []  Arthritis []  Other: ____________  Laterality: [x]  Rt []  Lt []  Both  Check all possible CPT codes:  *CHOOSE 10 OR LESS*    See Planned Interventions listed in the Plan section of the Evaluation.     "

## 2024-04-24 ENCOUNTER — Encounter (HOSPITAL_BASED_OUTPATIENT_CLINIC_OR_DEPARTMENT_OTHER): Payer: Self-pay | Admitting: Physical Therapy

## 2024-04-29 ENCOUNTER — Encounter (HOSPITAL_BASED_OUTPATIENT_CLINIC_OR_DEPARTMENT_OTHER): Payer: Self-pay | Admitting: Physical Therapy

## 2024-04-29 ENCOUNTER — Ambulatory Visit (HOSPITAL_BASED_OUTPATIENT_CLINIC_OR_DEPARTMENT_OTHER): Admitting: Physical Therapy

## 2024-04-29 DIAGNOSIS — M25662 Stiffness of left knee, not elsewhere classified: Secondary | ICD-10-CM

## 2024-04-29 DIAGNOSIS — M6281 Muscle weakness (generalized): Secondary | ICD-10-CM

## 2024-04-29 DIAGNOSIS — R262 Difficulty in walking, not elsewhere classified: Secondary | ICD-10-CM

## 2024-04-29 DIAGNOSIS — R2689 Other abnormalities of gait and mobility: Secondary | ICD-10-CM

## 2024-04-29 DIAGNOSIS — G8929 Other chronic pain: Secondary | ICD-10-CM

## 2024-04-29 NOTE — Therapy (Signed)
 " OUTPATIENT PHYSICAL THERAPY  Progress Note Reporting Period 10/06/2023 to 12/03/2023  See note below for Objective Data and Assessment of Progress/Goals.        Patient Name: Jaime Rose MRN: [id number] DOB:1951-01-13, 73 y.o., female Today's Date: 04/29/2024  END OF SESSION:  PT End of Session - 04/29/24 1305     Visit Number 3    Number of Visits 16    Date for Recertification  06/11/24    Authorization Type 18 visits app 12-30-2023- 02-29-2024    PT Start Time 1301    PT Stop Time 1344    PT Time Calculation (min) 43 min                           Past Medical History:  Diagnosis Date   ALLERGIC RHINITIS    Allergy 1985   seasonal allergies   Arthritis    CHICKENPOX, HX OF 12/21/2009   Qualifier: Diagnosis of  By: Inocencio MD, Berwyn DELENA    Heart murmur    MR   Hyperthyroidism    normal TFTs 11/2009 , dx 10/2011   Pre-diabetes    Varicose vein    s/p sclerosis tx summer 2013   Past Surgical History:  Procedure Laterality Date   ENDOVENOUS ABLATION SAPHENOUS VEIN W/ LASER  04/11/2012   endovenous laser ablation right greater saphenous vein and stab phlebectomy  right leg  by Krystal Doing MD   NO PAST SURGERIES     TOTAL HIP ARTHROPLASTY Right 07/26/2023   Procedure: ARTHROPLASTY, HIP, TOTAL, ANTERIOR APPROACH;  Surgeon: Vernetta Lonni GRADE, MD;  Location: [facility];  Service: Orthopedics;  Laterality: Right;   Patient Active Problem List   Diagnosis Date Noted   Unilateral primary osteoarthritis, right knee 03/12/2024   Unilateral primary osteoarthritis, left knee 03/12/2024   Status post total replacement of right hip 07/26/2023   Hyperlipidemia 07/17/2023   Elevated coronary artery calcium  score 07/17/2023   Preoperative clearance 07/17/2023   Mitral regurgitation, moderate-possibly severe 07/11/2023   Vitamin D  deficiency 05/23/2023   Foot pain, left 06/02/2021   Ptosis of right eyelid 04/12/2020   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  12/29/2018   Osteopenia 01/10/2017   Carotid artery stenosis, asymptomatic, bilateral 12/21/2016   Prediabetes 12/21/2016   Hypothyroidism 03/20/2012   Varicose veins of bilateral lower extremities with other complications 12/21/2011   Allergic rhinitis 12/21/2009   Cardiac murmur 12/21/2009       REFERRING PROVIDER: Lonni Vernetta, MD  REFERRING DIAG:  [phone] (ICD-10-CM) - Status post total replacement of right hip     S/p Rt THA Rationale for Evaluation and Treatment: Rehabilitation  THERAPY DIAG:  No diagnosis found.  ONSET DATE: DOS 07/26/23   SUBJECTIVE:  SUBJECTIVE STATEMENT: The patient was good yesterday but today she is in significant pain. She is having difficulty walking.    Eval:Patient returns to therapy with a new prescription for her knee.  She reports she was doing fairly well but had an acute onset of significant knee pain towards the beginning of December.  She reports increased difficulty walking.  She reports the stiffness is increased since that period of time.  She has tried a things given to her from physical therapy and has seen no significant reduction in pain.  She continues to try to move is much as possible.  The patient reports that her hip is doing well.  PERTINENT HISTORY:  none  PAIN:  Are you having pain? Yes: NPRS scale: knee 8/10 Pain location: Rt laeral hip Pain description: tight, tender Aggravating factors: walking Relieving factors: rest  PRECAUTIONS:  Anterior hip  RED FLAGS: None   WEIGHT BEARING RESTRICTIONS:  No  FALLS:  Has patient fallen in last 6 months? No  LIVING ENVIRONMENT: 4 steps to get into home, bedroom on main floor, has a second story.  OCCUPATION:  Next week to return to work   PLOF:  Independent  PATIENT  GOALS:  Don my own sock and shoes. Preserve left knee to avoid TKA, play pickle ball   OBJECTIVE:  Note: Objective measures were completed at Evaluation unless otherwise noted.  PATIENT SURVEYS:  LEFS 28/80 LEFS 7/8 47/80 LEFS 12/17 15/80   COGNITIVE STATUS: Within functional limits for tasks assessed   SENSATION: WFL  EDEMA:  Yes: mild  POSTURE:  Lacking full hip extension bilaterally  GAIT: EVAL: antalgic lacking hip extension through stance phase/toe off   Body Part #1 Hip  PALPATION: Eval- edema and warmth  LOWER EXTREMITY ROM:     Active  Right eval Left eval Left 6/6 Left 8/5 Left  10/13 10/22 Left 12/17  Hip flexion         Hip extension -12 -6       Hip abduction         Hip adduction         Hip internal rotation         Hip external rotation    95 103 106 62  Knee flexion         Knee extension 0 -8 -5 -5 -8 -5 -20    (Blank rows = not tested)     Gross strength   Left/ right  flexion 4+/4+ 8/26 flexion 5/5 Hip abduction 4+/4  8/26 5/4+ Knee extension 4+/4    8/26 5/4+       Strength testing not performed psych secondary to increased pain     Re-eval: Unable to assess 2nd to high levesl of pain     LOWER EXTREMITY MMT:    MMT Right 10/13  Left 10/13  Left   Hip flexion 18.8 15.3   Hip extension     Hip abduction 17.7 15.1   Hip adduction     Hip internal rotation     Hip external rotation     Knee flexion     Knee extension 20.9 23.2   Ankle dorsiflexion     Ankle plantarflexion     Ankle inversion     Ankle eversion      (Blank rows = not tested) not tested secondary to increased pain  TREATMENT DATE:  12/31 Manual: Grade 2 and 3 PA and AP mobilizations to reduce pain and improve mobility of the left knee.  Trigger point release to IT band.  Reviewed self trigger point release to IT band.   Self patellar  mobilization Patella mobilization  Edema massage Mulligan mobilization for flexion in seated position  There-ex:  Quad sets 2x10  SLR 3x10    12/24 Manual: Grade 2 and 3 PA and AP mobilizations to reduce pain and improve mobility of the left knee.  Trigger point release to IT band.  Reviewed self trigger point release to IT band.   Self patellar mobilization Patella mobilization  Edema massage Mulligan mobilization for flexion in seated position  There-ex: nu-step 5 min   12/17 Manual: Grade 2 and 3 PA and AP mobilizations to reduce pain and improve mobility of the left knee.  Trigger point release to IT band.  Reviewed self trigger point release to IT band.   Self patellar mobilization Patella mobilization  Edema massage  Vaso Pneumatic medium 15 minutes 32 degrees   There-ex:  Quad sets 3x10  Reviewed use of the nu-step in a few days      10/31 Manual: Grade 2 and 3 PA and AP mobilizations to reduce pain and improve mobility of the left knee.  Trigger point release to IT band.  Reviewed self trigger point release to IT band.   Self patellar mobilization Patella mobilization  Edema massage  There-ex:  SLR 2x12 each leg   SAQ 3x12 3lbs on the right no weight on the left   There-act:  Standing weight shift 3x12  Step up 6 inch rright 2x10  4 inch left 2x10   10/29 Manual: Grade 2 and 3 PA and AP mobilizations to reduce pain and improve mobility of the left knee.  Trigger point release to IT band.  Reviewed self trigger point release to IT band.   Self patellar mobilization Patella mobilization  Edema massage  There-ex:  SLR 2x12 each leg   SAQ 3x12 3lbs on the right no weight on the left  Quad set 3x12 Hip abduction green 3x12   10/24 Manual: Grade 2 and 3 PA and AP mobilizations to reduce pain and improve mobility of the left knee.  Trigger point release to IT band.  Reviewed self trigger point release to IT band.   Self patellar mobilization Patella  mobilization  Edema massage  There-ex:  SLR 2x12 each leg   SAQ 3x12 3lbs on the right no weight on the left   Neuromuscular reeducation: Heel toe rock 3 x 10  Slow march with weightbearing on right 3 x 10  10/22 Manual: Grade 2 and 3 PA and AP mobilizations to reduce pain and improve mobility of the left knee.  Trigger point release to IT band.  Reviewed self trigger point release to IT band.   Self patellar mobilization Patella mobilization  Edema massage   There-ex:  SLR 2x12 each leg   SAQ 3x12 3lbs on the right no weight on the left   There-act:  Standing weight shift 3x12  Step up 6 inch rright 2x10  4 inch left 2x10   Neuro-re-ed:  Step onto air-ex 2x10 fwd   10/17 Manual: Grade 2 and 3 PA and AP mobilizations to reduce pain and improve mobility of the left knee.  Trigger point release to IT band.  Reviewed self trigger point release to IT band.   Self patellar mobilization Patella mobilization  Edema  massage   There-ex:  Quad set 3x12  SLR 3x12 each leg   10/13 Manual: Grade 2 and 3 PA and AP mobilizations to reduce pain and improve mobility of the left knee.  Significant improvement in flexion noted with manual therapy.  Trigger point release to IT band.  Reviewed self trigger point release to IT band.  Self patellar mobilization. More emphasis on flexion ROM today.  There-ex:  Nu-step L5 5 min  SLR 2x12 each leg  LAQ 3x12  SAQ 3x12 Quad set 3x12  Standing weight shift 3x12   Manual muscle testing an review of goals.   10/9 Manual: Grade 2 and 3 PA and AP mobilizations to reduce pain and improve mobility of the left knee.  Significant improvement in flexion noted with manual therapy.  Trigger point release to IT band.  Reviewed self trigger point release to IT band.  Self patellar mobilization. More emphasis on flexion ROM today.  There-ex:  Nu-step L5 5 min  SLR 2x12 each leg  LAQ 3x12  SAQ 3x12 Quad set 3x12  Standing weight shift 3x12      PATIENT EDUCATION:  Education details: Anatomy of condition, POC, HEP, exercise form/rationale Person educated: Patient Education method: Explanation, Demonstration, Tactile cues, Verbal cues, and Handouts Education comprehension: verbalized understanding, returned demonstration, verbal cues required, tactile cues required, and needs further education  HOME EXERCISE PROGRAM: Access Code: [email or web address] URL: [email or web address]  ASSESSMENT:  CLINICAL IMPRESSION: The patients knee is better. She had improved flexion and tolerance to extension. We reviewed pre-ha exercises. She tolerated well. We reviewed symptom management We will continue to progress with manual therapy and strengthening.   Eval: The patient returns to physical therapy following an acute onset of left knee pain.  She has been able to do her exercises to reduce pain.  Therapy focused manual therapy to reduce inflammation she has some difficulty even tolerating manual therapy today.  She did report mild improvement in pain and motion following physical therapy.  We reviewed light exercises today to begin trying.  She was also advised that she can move her knee on something like the NuStep in a pain-free range that might be helpful.  She would benefit from skilled therapy to improve range of motion, general functional mobility, and reduce strength.  Therapy trialed vasopneumatic icing today.  She did not like vasopneumatic device.  We will not perform again in the future .CLINICAL DECISION MAKING: Stable/uncomplicated  EVALUATION COMPLEXITY: Low   GOALS: Goals reviewed with patient? Yes  SHORT TERM GOALS: Target date: 01/21/2024    Patient will increase left knee flexion by 30 degrees baseline: Goal status: more limited today 12/24  2.  Patient will increase passive'knee extension 12 degrees Baseline:  Goal status:  3.  Patient will ambulate 500 feet without significant increase in left knee  pain    LONG TERM GOALS: Target date: 02/18/2024    Stand as long as necessary for work with minimal discomfort Baseline:  Goal status: Initial  2.  LEFS to improve by MDC x2 Baseline:  Goal status: Initial 3.  Patient will go up and down 8 steps with reciprocal gait pattern without significant increase in left knee pain Goal status: Initial 4.  Easily don socks and shoes independently Baseline:  Goal status: Initial       PLAN:  PT FREQUENCY: 1-2x/week  PT DURATION:8 weeks  PLANNED INTERVENTIONS: 97164- PT Re-evaluation, 97750- Physical Performance Testing, 97110-Therapeutic exercises, 97530- Therapeutic activity, [id number]- Neuromuscular re-education, 97535- Self Care,  02859- Manual therapy, [id number]- Gait training, [id number]- Aquatic Therapy, Patient/Family education, Balance training, Stair training, Taping, Dry Needling, Joint mobilization, Spinal mobilization, Scar mobilization, Cryotherapy, and Moist heat.  PLAN FOR NEXT SESSION: Continue with manual therapy to reduce acute inflammation and improve range of motion.  Progress back and exercises as tolerated. Alm Don PT DPT  04/29/2024 1:24 PM    Referring diagnosis?  S03.358 (ICD-10-CM) - Status post total replacement of right hip    Treatment diagnosis? (if different than referring diagnosis) M25.551 What was this (referring dx) caused by? [x]  Surgery []  Fall []  Ongoing issue []  Arthritis []  Other: ____________  Laterality: [x]  Rt []  Lt []  Both  Check all possible CPT codes:  *CHOOSE 10 OR LESS*    See Planned Interventions listed in the Plan section of the Evaluation.     "

## 2024-05-08 ENCOUNTER — Ambulatory Visit (HOSPITAL_BASED_OUTPATIENT_CLINIC_OR_DEPARTMENT_OTHER): Attending: Orthopaedic Surgery | Admitting: Physical Therapy

## 2024-05-08 DIAGNOSIS — M6281 Muscle weakness (generalized): Secondary | ICD-10-CM | POA: Insufficient documentation

## 2024-05-08 DIAGNOSIS — G8929 Other chronic pain: Secondary | ICD-10-CM | POA: Diagnosis present

## 2024-05-08 DIAGNOSIS — M25562 Pain in left knee: Secondary | ICD-10-CM | POA: Insufficient documentation

## 2024-05-08 DIAGNOSIS — R2689 Other abnormalities of gait and mobility: Secondary | ICD-10-CM | POA: Diagnosis present

## 2024-05-08 DIAGNOSIS — M25662 Stiffness of left knee, not elsewhere classified: Secondary | ICD-10-CM | POA: Insufficient documentation

## 2024-05-08 DIAGNOSIS — R262 Difficulty in walking, not elsewhere classified: Secondary | ICD-10-CM | POA: Diagnosis present

## 2024-05-08 NOTE — Therapy (Unsigned)
 " OUTPATIENT PHYSICAL THERAPY  Progress Note Reporting Period 10/06/2023 to 12/03/2023  See note below for Objective Data and Assessment of Progress/Goals.        Patient Name: Jaime Rose MRN: [id number] DOB:Jan 27, 1951, 74 y.o., female Today's Date: 05/08/2024  END OF SESSION:                     Past Medical History:  Diagnosis Date   ALLERGIC RHINITIS    Allergy 1985   seasonal allergies   Arthritis    CHICKENPOX, HX OF 12/21/2009   Qualifier: Diagnosis of  By: Inocencio MD, Berwyn DELENA    Heart murmur    MR   Hyperthyroidism    normal TFTs 11/2009 , dx 10/2011   Pre-diabetes    Varicose vein    s/p sclerosis tx summer 2013   Past Surgical History:  Procedure Laterality Date   ENDOVENOUS ABLATION SAPHENOUS VEIN W/ LASER  04/11/2012   endovenous laser ablation right greater saphenous vein and stab phlebectomy  right leg  by Krystal Doing MD   NO PAST SURGERIES     TOTAL HIP ARTHROPLASTY Right 07/26/2023   Procedure: ARTHROPLASTY, HIP, TOTAL, ANTERIOR APPROACH;  Surgeon: Vernetta Lonni GRADE, MD;  Location: [facility];  Service: Orthopedics;  Laterality: Right;   Patient Active Problem List   Diagnosis Date Noted   Unilateral primary osteoarthritis, right knee 03/12/2024   Unilateral primary osteoarthritis, left knee 03/12/2024   Status post total replacement of right hip 07/26/2023   Hyperlipidemia 07/17/2023   Elevated coronary artery calcium  score 07/17/2023   Preoperative clearance 07/17/2023   Mitral regurgitation, moderate-possibly severe 07/11/2023   Vitamin D  deficiency 05/23/2023   Foot pain, left 06/02/2021   Ptosis of right eyelid 04/12/2020   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 12/29/2018   Osteopenia 01/10/2017   Carotid artery stenosis, asymptomatic, bilateral 12/21/2016   Prediabetes 12/21/2016   Hypothyroidism 03/20/2012   Varicose veins of bilateral lower extremities with other complications 12/21/2011   Allergic rhinitis 12/21/2009    Cardiac murmur 12/21/2009       REFERRING PROVIDER: Lonni Vernetta, MD  REFERRING DIAG:  [phone] (ICD-10-CM) - Status post total replacement of right hip     S/p Rt THA Rationale for Evaluation and Treatment: Rehabilitation  THERAPY DIAG:  No diagnosis found.  ONSET DATE: DOS 07/26/23   SUBJECTIVE:                                                                                                                                                                                           SUBJECTIVE STATEMENT: The patient has tried taking magnesium  because they read it can help promote healing. Pt. States they've read more about total knee replacements and seen some cases and is more willing to consider a knee replacement. States they want to be able to get better faster. Wants to get back to basic ADLs and be able to straighten her leg, not looking for anything crazy. Looking at someone in [place] who does a minimally invasive approach. Looking into people and their outcomes. Pt. Mentioned during manual that they're having new pain on medial knee.    Eval:Patient returns to therapy with a new prescription for her knee.  She reports she was doing fairly well but had an acute onset of significant knee pain towards the beginning of December.  She reports increased difficulty walking.  She reports the stiffness is increased since that period of time.  She has tried a things given to her from physical therapy and has seen no significant reduction in pain.  She continues to try to move is much as possible.  The patient reports that her hip is doing well.  PERTINENT HISTORY:  none  PAIN:  Are you having pain? Yes: NPRS scale: knee 8/10 Pain location: Rt laeral hip Pain description: tight, tender Aggravating factors: walking Relieving factors: rest  PRECAUTIONS:  Anterior hip  RED FLAGS: None   WEIGHT BEARING RESTRICTIONS:  No  FALLS:  Has patient fallen in last 6 months?  No  LIVING ENVIRONMENT: 4 steps to get into home, bedroom on main floor, has a second story.  OCCUPATION:  Next week to return to work   PLOF:  Independent  PATIENT GOALS:  Don my own sock and shoes. Preserve left knee to avoid TKA, play pickle ball   OBJECTIVE:  Note: Objective measures were completed at Evaluation unless otherwise noted.  PATIENT SURVEYS:  LEFS 28/80 LEFS 7/8 47/80 LEFS 12/17 15/80   COGNITIVE STATUS: Within functional limits for tasks assessed   SENSATION: WFL  EDEMA:  Yes: mild  POSTURE:  Lacking full hip extension bilaterally  GAIT: EVAL: antalgic lacking hip extension through stance phase/toe off   Body Part #1 Hip  PALPATION: Eval- edema and warmth  LOWER EXTREMITY ROM:     Active  Right eval Left eval Left 6/6 Left 8/5 Left  10/13 10/22 Left 12/17 Left 1/9  Hip flexion          Hip extension -12 -6        Hip abduction          Hip adduction          Hip internal rotation          Hip external rotation    95 103 106 62   Knee flexion        98  Knee extension 0 -8 -5 -5 -8 -5 -20     (Blank rows = not tested)     Gross strength   Left/ right  flexion 4+/4+ 8/26 flexion 5/5 Hip abduction 4+/4  8/26 5/4+ Knee extension 4+/4    8/26 5/4+       Strength testing not performed psych secondary to increased pain     Re-eval: Unable to assess 2nd to high levesl of pain     LOWER EXTREMITY MMT:    MMT Right 10/13  Left 10/13  Left   Hip flexion 18.8 15.3   Hip extension     Hip abduction 17.7 15.1   Hip adduction     Hip internal rotation  Hip external rotation     Knee flexion     Knee extension 20.9 23.2   Ankle dorsiflexion     Ankle plantarflexion     Ankle inversion     Ankle eversion      (Blank rows = not tested) not tested secondary to increased pain                                                                                                               TREATMENT DATE:  1/9 Manual:  Grade 2 and 3 PA and AP mobilizations to reduce pain and improve mobility of the left knee.  Trigger point release to IT band.  Reviewed self trigger point release to IT band.   Self patellar mobilization Patella mobilization  Edema massage Mulligan mobilization for flexion in seated position  There-ex:  Quad sets 2x10  SLR 3x10  Step-ups 2x10 4- pt. States they feel the down part of the exercise in the back of their knees. Sitting abd green band 3x12  12/31 Manual: Grade 2 and 3 PA and AP mobilizations to reduce pain and improve mobility of the left knee.  Trigger point release to IT band.  Reviewed self trigger point release to IT band.   Self patellar mobilization Patella mobilization  Edema massage Mulligan mobilization for flexion in seated position  There-ex:  Quad sets 2x10  SLR 3x10    12/24 Manual: Grade 2 and 3 PA and AP mobilizations to reduce pain and improve mobility of the left knee.  Trigger point release to IT band.  Reviewed self trigger point release to IT band.   Self patellar mobilization Patella mobilization  Edema massage Mulligan mobilization for flexion in seated position  There-ex: nu-step 5 min   12/17 Manual: Grade 2 and 3 PA and AP mobilizations to reduce pain and improve mobility of the left knee.  Trigger point release to IT band.  Reviewed self trigger point release to IT band.   Self patellar mobilization Patella mobilization  Edema massage  Vaso Pneumatic medium 15 minutes 32 degrees   There-ex:  Quad sets 3x10  Reviewed use of the nu-step in a few days      10/31 Manual: Grade 2 and 3 PA and AP mobilizations to reduce pain and improve mobility of the left knee.  Trigger point release to IT band.  Reviewed self trigger point release to IT band.   Self patellar mobilization Patella mobilization  Edema massage  There-ex:  SLR 2x12 each leg   SAQ 3x12 3lbs on the right no weight on the left   There-act:  Standing weight shift  3x12  Step up 6 inch rright 2x10  4 inch left 2x10   10/29 Manual: Grade 2 and 3 PA and AP mobilizations to reduce pain and improve mobility of the left knee.  Trigger point release to IT band.  Reviewed self trigger point release to IT band.   Self patellar mobilization Patella mobilization  Edema massage  There-ex:  SLR 2x12 each leg  SAQ 3x12 3lbs on the right no weight on the left  Quad set 3x12 Hip abduction green 3x12   10/24 Manual: Grade 2 and 3 PA and AP mobilizations to reduce pain and improve mobility of the left knee.  Trigger point release to IT band.  Reviewed self trigger point release to IT band.   Self patellar mobilization Patella mobilization  Edema massage  There-ex:  SLR 2x12 each leg   SAQ 3x12 3lbs on the right no weight on the left   Neuromuscular reeducation: Heel toe rock 3 x 10  Slow march with weightbearing on right 3 x 10  10/22 Manual: Grade 2 and 3 PA and AP mobilizations to reduce pain and improve mobility of the left knee.  Trigger point release to IT band.  Reviewed self trigger point release to IT band.   Self patellar mobilization Patella mobilization  Edema massage   There-ex:  SLR 2x12 each leg   SAQ 3x12 3lbs on the right no weight on the left   There-act:  Standing weight shift 3x12  Step up 6 inch rright 2x10  4 inch left 2x10   Neuro-re-ed:  Step onto air-ex 2x10 fwd   10/17 Manual: Grade 2 and 3 PA and AP mobilizations to reduce pain and improve mobility of the left knee.  Trigger point release to IT band.  Reviewed self trigger point release to IT band.   Self patellar mobilization Patella mobilization  Edema massage   There-ex:  Quad set 3x12  SLR 3x12 each leg   10/13 Manual: Grade 2 and 3 PA and AP mobilizations to reduce pain and improve mobility of the left knee.  Significant improvement in flexion noted with manual therapy.  Trigger point release to IT band.  Reviewed self trigger point release to IT band.   Self patellar mobilization. More emphasis on flexion ROM today.  There-ex:  Nu-step L5 5 min  SLR 2x12 each leg  LAQ 3x12  SAQ 3x12 Quad set 3x12  Standing weight shift 3x12   Manual muscle testing an review of goals.   10/9 Manual: Grade 2 and 3 PA and AP mobilizations to reduce pain and improve mobility of the left knee.  Significant improvement in flexion noted with manual therapy.  Trigger point release to IT band.  Reviewed self trigger point release to IT band.  Self patellar mobilization. More emphasis on flexion ROM today.  There-ex:  Nu-step L5 5 min  SLR 2x12 each leg  LAQ 3x12  SAQ 3x12 Quad set 3x12  Standing weight shift 3x12     PATIENT EDUCATION:  Education details: Anatomy of condition, POC, HEP, exercise form/rationale Person educated: Patient Education method: Explanation, Demonstration, Tactile cues, Verbal cues, and Handouts Education comprehension: verbalized understanding, returned demonstration, verbal cues required, tactile cues required, and needs further education  HOME EXERCISE PROGRAM: Access Code: [email or web address] URL: [email or web address]  ASSESSMENT:  CLINICAL IMPRESSION: The patients flexion improved since last measurement by ~30. Pt. Still does not have full extension in knee. PT provided pt. Education and talked through possibility of TKR in left knee. Pt. Attempted to do step ups but had to stop because of the down part caused pain. Pt. Will benefit from continuation of skilled manual therapy and pt. Education regarding surgical options.   Eval: The patient returns to physical therapy following an acute onset of left knee pain.  She has been able to do her exercises to reduce pain.  Therapy focused manual therapy to reduce inflammation she  has some difficulty even tolerating manual therapy today.  She did report mild improvement in pain and motion following physical therapy.  We reviewed light exercises today to begin trying.  She  was also advised that she can move her knee on something like the NuStep in a pain-free range that might be helpful.  She would benefit from skilled therapy to improve range of motion, general functional mobility, and reduce strength.  Therapy trialed vasopneumatic icing today.  She did not like vasopneumatic device.  We will not perform again in the future .CLINICAL DECISION MAKING: Stable/uncomplicated  EVALUATION COMPLEXITY: Low   GOALS: Goals reviewed with patient? Yes  SHORT TERM GOALS: Target date: 01/21/2024    Patient will increase left knee flexion by 30 degrees baseline: Goal status: more limited today 12/24  2.  Patient will increase passive'knee extension 12 degrees Baseline:  Goal status:  3.  Patient will ambulate 500 feet without significant increase in left knee pain    LONG TERM GOALS: Target date: 02/18/2024    Stand as long as necessary for work with minimal discomfort Baseline:  Goal status: Initial  2.  LEFS to improve by MDC x2 Baseline:  Goal status: Initial 3.  Patient will go up and down 8 steps with reciprocal gait pattern without significant increase in left knee pain Goal status: Initial 4.  Easily don socks and shoes independently Baseline:  Goal status: Initial       PLAN:  PT FREQUENCY: 1-2x/week  PT DURATION:8 weeks  PLANNED INTERVENTIONS: 97164- PT Re-evaluation, 97750- Physical Performance Testing, 97110-Therapeutic exercises, 97530- Therapeutic activity, [id number]- Neuromuscular re-education, 97535- Self Care, [id number]- Manual therapy, [phone]- Gait training, [phone]- Aquatic Therapy, Patient/Family education, Balance training, Stair training, Taping, Dry Needling, Joint mobilization, Spinal mobilization, Scar mobilization, Cryotherapy, and Moist heat.  PLAN FOR NEXT SESSION: Continue with manual therapy to reduce acute inflammation and improve range of motion.  Progress back and exercises as tolerated. Alm Don PT DPT  05/08/2024  11:51 AM    Referring diagnosis?  S03.358 (ICD-10-CM) - Status post total replacement of right hip    Treatment diagnosis? (if different than referring diagnosis) M25.551 What was this (referring dx) caused by? [x]  Surgery []  Fall []  Ongoing issue []  Arthritis []  Other: ____________  Laterality: [x]  Rt []  Lt []  Both  Check all possible CPT codes:  *CHOOSE 10 OR LESS*    See Planned Interventions listed in the Plan section of the Evaluation.     "

## 2024-05-10 ENCOUNTER — Encounter (HOSPITAL_BASED_OUTPATIENT_CLINIC_OR_DEPARTMENT_OTHER): Payer: Self-pay | Admitting: Physical Therapy

## 2024-05-13 ENCOUNTER — Other Ambulatory Visit (INDEPENDENT_AMBULATORY_CARE_PROVIDER_SITE_OTHER)

## 2024-05-13 ENCOUNTER — Encounter: Payer: Self-pay | Admitting: Orthopaedic Surgery

## 2024-05-13 ENCOUNTER — Telehealth: Payer: Self-pay

## 2024-05-13 ENCOUNTER — Ambulatory Visit: Admitting: Orthopaedic Surgery

## 2024-05-13 DIAGNOSIS — Z96641 Presence of right artificial hip joint: Secondary | ICD-10-CM

## 2024-05-13 DIAGNOSIS — M25552 Pain in left hip: Secondary | ICD-10-CM

## 2024-05-13 DIAGNOSIS — M1712 Unilateral primary osteoarthritis, left knee: Secondary | ICD-10-CM

## 2024-05-13 NOTE — Progress Notes (Signed)
 The patient is a 74 year old female well-known to us .  We replaced her right hip back in March of this past year secondary to osteoarthritis.  She has been having some left hip and groin pain but is also been dealing with worsening left knee pain.  Her right and left hip moves smoothly and fluidly with no blocks to rotation.  Her left knee though has some valgus malalignment.  She is walk with a significant limp.  There is significant lateral joint line tenderness and some medial tenderness.  There is a mild effusion and patellofemoral crepitation.  X-rays today of her pelvis and left hip show the left hip joint space is well-maintained and the hip replaced on the right side looks good.  However x-rays of her left knee show significant worsening of her left knee arthritis with lateral joint space bone-on-bone wear and some valgus malalignment as well as patellofemoral arthritis.  She did let me know that she is seeing a knee specialist soon in [place] who performs minimally invasive left knee replacement surgery.  I believe that they do robotic surgery as well down there.  Most likely this is a quad sparing type of approach surgery and I explained to her what that involves and do feel it is worth her seeing that surgeon to get his thoughts about the surgery and considering her having a left knee replacement that is more mentally invasive if that is what it involves.  I did let her know that I am happy to see her later in follow-up for her left knee if she does have surgery on in [place] that she would still need to see that surgeon for the first postoperative visit but then I am certainly fine with seeing her in subsequent visits just even for our own education as a relates to recovery from the less invasive knee replacement surgery.

## 2024-05-13 NOTE — Telephone Encounter (Signed)
 Patient forgot to discuss the handicap placard during the visit.  The patient left the form to be complete by Dr. Vernetta and patient stated it is okay to call her when it is ready for pick-up.

## 2024-05-14 NOTE — Telephone Encounter (Signed)
 To Dr. Vernetta

## 2024-05-15 ENCOUNTER — Telehealth: Payer: Self-pay | Admitting: Orthopaedic Surgery

## 2024-05-15 NOTE — Telephone Encounter (Signed)
 IC patient. Advised her handicap parking application is ready to pick up at the front desk.

## 2024-05-21 ENCOUNTER — Ambulatory Visit (HOSPITAL_BASED_OUTPATIENT_CLINIC_OR_DEPARTMENT_OTHER): Admitting: Physical Therapy

## 2024-05-21 DIAGNOSIS — R2689 Other abnormalities of gait and mobility: Secondary | ICD-10-CM

## 2024-05-21 DIAGNOSIS — G8929 Other chronic pain: Secondary | ICD-10-CM

## 2024-05-21 DIAGNOSIS — M25662 Stiffness of left knee, not elsewhere classified: Secondary | ICD-10-CM

## 2024-05-21 DIAGNOSIS — M6281 Muscle weakness (generalized): Secondary | ICD-10-CM

## 2024-05-21 DIAGNOSIS — R262 Difficulty in walking, not elsewhere classified: Secondary | ICD-10-CM

## 2024-05-21 DIAGNOSIS — M25562 Pain in left knee: Secondary | ICD-10-CM | POA: Diagnosis not present

## 2024-05-21 NOTE — Therapy (Incomplete)
 " OUTPATIENT PHYSICAL THERAPY  Progress Note Reporting Period 10/06/2023 to 12/03/2023  See note below for Objective Data and Assessment of Progress/Goals.        Patient Name: Jaime Rose MRN: [id number] DOB:October 16, 1950, 74 y.o., female Today's Date: 05/21/2024  END OF SESSION:                      Past Medical History:  Diagnosis Date   ALLERGIC RHINITIS    Allergy 1985   seasonal allergies   Arthritis    CHICKENPOX, HX OF 12/21/2009   Qualifier: Diagnosis of  By: Inocencio MD, [place] DELENA    Heart murmur    MR   Hyperthyroidism    normal TFTs 11/2009 , dx 10/2011   Pre-diabetes    Varicose vein    s/p sclerosis tx summer 2013   Past Surgical History:  Procedure Laterality Date   ENDOVENOUS ABLATION SAPHENOUS VEIN W/ LASER  04/11/2012   endovenous laser ablation right greater saphenous vein and stab phlebectomy  right leg  by Krystal Doing MD   NO PAST SURGERIES     TOTAL HIP ARTHROPLASTY Right 07/26/2023   Procedure: ARTHROPLASTY, HIP, TOTAL, ANTERIOR APPROACH;  Surgeon: Vernetta Lonni GRADE, MD;  Location: [facility];  Service: Orthopedics;  Laterality: Right;   Patient Active Problem List   Diagnosis Date Noted   Unilateral primary osteoarthritis, right knee 03/12/2024   Unilateral primary osteoarthritis, left knee 03/12/2024   Status post total replacement of right hip 07/26/2023   Hyperlipidemia 07/17/2023   Elevated coronary artery calcium  score 07/17/2023   Preoperative clearance 07/17/2023   Mitral regurgitation, moderate-possibly severe 07/11/2023   Vitamin D  deficiency 05/23/2023   Foot pain, left 06/02/2021   Ptosis of right eyelid 04/12/2020   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 12/29/2018   Osteopenia 01/10/2017   Carotid artery stenosis, asymptomatic, bilateral 12/21/2016   Prediabetes 12/21/2016   Hypothyroidism 03/20/2012   Varicose veins of bilateral lower extremities with other complications 12/21/2011   Allergic rhinitis 12/21/2009    Cardiac murmur 12/21/2009       REFERRING PROVIDER: Lonni Vernetta, MD  REFERRING DIAG:  [id number] (ICD-10-CM) - Status post total replacement of right hip     S/p Rt THA Rationale for Evaluation and Treatment: Rehabilitation  THERAPY DIAG:  No diagnosis found.  ONSET DATE: DOS 07/26/23   SUBJECTIVE:                                                                                                                                                                                           SUBJECTIVE STATEMENT: Pt is going up to  [place] this weekend to see the Dr.    Eval:Patient returns to therapy with a new prescription for her knee.  She reports she was doing fairly well but had an acute onset of significant knee pain towards the beginning of December.  She reports increased difficulty walking.  She reports the stiffness is increased since that period of time.  She has tried a things given to her from physical therapy and has seen no significant reduction in pain.  She continues to try to move is much as possible.  The patient reports that her hip is doing well.  PERTINENT HISTORY:  none  PAIN:  Are you having pain? Yes: NPRS scale: knee 8/10 Pain location: Rt laeral hip Pain description: tight, tender Aggravating factors: walking Relieving factors: rest  PRECAUTIONS:  Anterior hip  RED FLAGS: None   WEIGHT BEARING RESTRICTIONS:  No  FALLS:  Has patient fallen in last 6 months? No  LIVING ENVIRONMENT: 4 steps to get into home, bedroom on main floor, has a second story.  OCCUPATION:  Next week to return to work   PLOF:  Independent  PATIENT GOALS:  Don my own sock and shoes. Preserve left knee to avoid TKA, play pickle ball   OBJECTIVE:  Note: Objective measures were completed at Evaluation unless otherwise noted.  PATIENT SURVEYS:  LEFS 28/80 LEFS 7/8 47/80 LEFS 12/17 15/80   COGNITIVE STATUS: Within functional limits for tasks  assessed   SENSATION: WFL  EDEMA:  Yes: mild  POSTURE:  Lacking full hip extension bilaterally  GAIT: EVAL: antalgic lacking hip extension through stance phase/toe off   Body Part #1 Hip  PALPATION: Eval- edema and warmth  LOWER EXTREMITY ROM:     Active  Right eval Left eval Left 6/6 Left 8/5 Left  10/13 10/22 Left 12/17 Left 1/9  Hip flexion          Hip extension -12 -6        Hip abduction          Hip adduction          Hip internal rotation          Hip external rotation    95 103 106 62   Knee flexion        98  Knee extension 0 -8 -5 -5 -8 -5 -20     (Blank rows = not tested)     Gross strength   Left/ right  flexion 4+/4+ 8/26 flexion 5/5 Hip abduction 4+/4  8/26 5/4+ Knee extension 4+/4    8/26 5/4+       Strength testing not performed psych secondary to increased pain     Re-eval: Unable to assess 2nd to high levesl of pain     LOWER EXTREMITY MMT:    MMT Right 10/13  Left 10/13  Left   Hip flexion 18.8 15.3   Hip extension     Hip abduction 17.7 15.1   Hip adduction     Hip internal rotation     Hip external rotation     Knee flexion     Knee extension 20.9 23.2   Ankle dorsiflexion     Ankle plantarflexion     Ankle inversion     Ankle eversion      (Blank rows = not tested) not tested secondary to increased pain  TREATMENT DATE:  1/9 Manual: Grade 2 and 3 PA and AP mobilizations to reduce pain and improve mobility of the left knee.  Trigger point release to IT band.  Reviewed self trigger point release to IT band.   Self patellar mobilization Patella mobilization  Edema massage Mulligan mobilization for flexion in seated position  There-ex:  Quad sets 2x10  SLR 3x10  Sitting abd green band 3x12    12/31 Manual: Grade 2 and 3 PA and AP mobilizations to reduce pain and improve mobility of the left knee.   Trigger point release to IT band.  Reviewed self trigger point release to IT band.   Self patellar mobilization Patella mobilization  Edema massage Mulligan mobilization for flexion in seated position  There-ex:  Quad sets 2x10  SLR 3x10    12/24 Manual: Grade 2 and 3 PA and AP mobilizations to reduce pain and improve mobility of the left knee.  Trigger point release to IT band.  Reviewed self trigger point release to IT band.   Self patellar mobilization Patella mobilization  Edema massage Mulligan mobilization for flexion in seated position  There-ex: nu-step 5 min   12/17 Manual: Grade 2 and 3 PA and AP mobilizations to reduce pain and improve mobility of the left knee.  Trigger point release to IT band.  Reviewed self trigger point release to IT band.   Self patellar mobilization Patella mobilization  Edema massage  Vaso Pneumatic medium 15 minutes 32 degrees   There-ex:  Quad sets 3x10  Reviewed use of the nu-step in a few days      10/31 Manual: Grade 2 and 3 PA and AP mobilizations to reduce pain and improve mobility of the left knee.  Trigger point release to IT band.  Reviewed self trigger point release to IT band.   Self patellar mobilization Patella mobilization  Edema massage  There-ex:  SLR 2x12 each leg   SAQ 3x12 3lbs on the right no weight on the left   There-act:  Standing weight shift 3x12  Step up 6 inch rright 2x10  4 inch left 2x10   10/29 Manual: Grade 2 and 3 PA and AP mobilizations to reduce pain and improve mobility of the left knee.  Trigger point release to IT band.  Reviewed self trigger point release to IT band.   Self patellar mobilization Patella mobilization  Edema massage  There-ex:  SLR 2x12 each leg   SAQ 3x12 3lbs on the right no weight on the left  Quad set 3x12 Hip abduction green 3x12   10/24 Manual: Grade 2 and 3 PA and AP mobilizations to reduce pain and improve mobility of the left knee.  Trigger point release to  IT band.  Reviewed self trigger point release to IT band.   Self patellar mobilization Patella mobilization  Edema massage  There-ex:  SLR 2x12 each leg   SAQ 3x12 3lbs on the right no weight on the left   Neuromuscular reeducation: Heel toe rock 3 x 10  Slow march with weightbearing on right 3 x 10     PATIENT EDUCATION:  Education details: Teacher, Music of condition, POC, HEP, exercise form/rationale Person educated: Patient Education method: Explanation, Demonstration, Tactile cues, Verbal cues, and Handouts Education comprehension: verbalized understanding, returned demonstration, verbal cues required, tactile cues required, and needs further education  HOME EXERCISE PROGRAM: Access Code: [email or web address] URL: [email or web address]  ASSESSMENT:  CLINICAL IMPRESSION: The patients flexion improved since last measurement by ~30. Pt. Still does not have  full extension in knee. PT provided pt. Education and talked through possibility of TKR in left knee. Pt. Attempted to do step ups but had to stop because of the down part caused pain. Pt. Will benefit from continuation of skilled manual therapy and pt. Education regarding surgical options.   Eval: The patient returns to physical therapy following an acute onset of left knee pain.  She has been able to do her exercises to reduce pain.  Therapy focused manual therapy to reduce inflammation she has some difficulty even tolerating manual therapy today.  She did report mild improvement in pain and motion following physical therapy.  We reviewed light exercises today to begin trying.  She was also advised that she can move her knee on something like the NuStep in a pain-free range that might be helpful.  She would benefit from skilled therapy to improve range of motion, general functional mobility, and reduce strength.  Therapy trialed vasopneumatic icing today.  She did not like vasopneumatic device.  We will not perform again in the  future .CLINICAL DECISION MAKING: Stable/uncomplicated  EVALUATION COMPLEXITY: Low   GOALS: Goals reviewed with patient? Yes  SHORT TERM GOALS: Target date: 01/21/2024    Patient will increase left knee flexion by 30 degrees baseline: Goal status: more limited today 12/24  2.  Patient will increase passive'knee extension 12 degrees Baseline:  Goal status:  3.  Patient will ambulate 500 feet without significant increase in left knee pain    LONG TERM GOALS: Target date: 02/18/2024    Stand as long as necessary for work with minimal discomfort Baseline:  Goal status: Initial  2.  LEFS to improve by MDC x2 Baseline:  Goal status: Initial 3.  Patient will go up and down 8 steps with reciprocal gait pattern without significant increase in left knee pain Goal status: Initial 4.  Easily don socks and shoes independently Baseline:  Goal status: Initial       PLAN:  PT FREQUENCY: 1-2x/week  PT DURATION:8 weeks  PLANNED INTERVENTIONS: 97164- PT Re-evaluation, 97750- Physical Performance Testing, 97110-Therapeutic exercises, 97530- Therapeutic activity, [id number]- Neuromuscular re-education, 97535- Self Care, 02859- Manual therapy, [phone]- Gait training, [phone]- Aquatic Therapy, Patient/Family education, Balance training, Stair training, Taping, Dry Needling, Joint mobilization, Spinal mobilization, Scar mobilization, Cryotherapy, and Moist heat.  PLAN FOR NEXT SESSION: Continue with manual therapy to reduce acute inflammation and improve range of motion.  Progress back and exercises as tolerated. Alm Don PT DPT  05/21/24 9:37 AM    Referring diagnosis?  S03.358 (ICD-10-CM) - Status post total replacement of right hip    Treatment diagnosis? (if different than referring diagnosis) M25.551 What was this (referring dx) caused by? [x]  Surgery []  Fall []  Ongoing issue []  Arthritis []  Other: ____________  Laterality: [x]  Rt []  Lt []  Both  Check all possible  CPT codes:  *CHOOSE 10 OR LESS*    See Planned Interventions listed in the Plan section of the Evaluation.     "

## 2024-05-21 NOTE — Therapy (Signed)
 " OUTPATIENT PHYSICAL THERAPY  Progress Note Reporting Period 10/06/2023 to 12/03/2023  See note below for Objective Data and Assessment of Progress/Goals.        Patient Name: Jaime Rose MRN: [id number] DOB:[birth year], 74 y.o., female Today's Date: 05/22/2024  END OF SESSION:  PT End of Session - 05/22/24 0755     Visit Number 5    Number of Visits 16    Date for Recertification  06/11/24    Authorization Type 18 visits app 12-30-2023- 02-29-2024    PT Start Time 0930    PT Stop Time 1012    PT Time Calculation (min) 42 min    Activity Tolerance Patient tolerated treatment well    Behavior During Therapy [facility] for tasks assessed/performed                             Past Medical History:  Diagnosis Date   ALLERGIC RHINITIS    Allergy 1985   seasonal allergies   Arthritis    CHICKENPOX, HX OF 12/21/2009   Qualifier: Diagnosis of  By: Inocencio MD, [place] DELENA    Heart murmur    MR   Hyperthyroidism    normal TFTs 11/2009 , dx 10/2011   Pre-diabetes    Varicose vein    s/p sclerosis tx summer 2013   Past Surgical History:  Procedure Laterality Date   ENDOVENOUS ABLATION SAPHENOUS VEIN W/ LASER  04/11/2012   endovenous laser ablation right greater saphenous vein and stab phlebectomy  right leg  by Krystal Doing MD   NO PAST SURGERIES     TOTAL HIP ARTHROPLASTY Right 07/26/2023   Procedure: ARTHROPLASTY, HIP, TOTAL, ANTERIOR APPROACH;  Surgeon: Vernetta Lonni GRADE, MD;  Location: [facility];  Service: Orthopedics;  Laterality: Right;   Patient Active Problem List   Diagnosis Date Noted   Unilateral primary osteoarthritis, right knee 03/12/2024   Unilateral primary osteoarthritis, left knee 03/12/2024   Status post total replacement of right hip 07/26/2023   Hyperlipidemia 07/17/2023   Elevated coronary artery calcium  score 07/17/2023   Preoperative clearance 07/17/2023   Mitral regurgitation, moderate-possibly severe 07/11/2023   Vitamin D   deficiency 05/23/2023   Foot pain, left 06/02/2021   Ptosis of right eyelid 04/12/2020   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 12/29/2018   Osteopenia 01/10/2017   Carotid artery stenosis, asymptomatic, bilateral 12/21/2016   Prediabetes 12/21/2016   Hypothyroidism 03/20/2012   Varicose veins of bilateral lower extremities with other complications 12/21/2011   Allergic rhinitis 12/21/2009   Cardiac murmur 12/21/2009       REFERRING PROVIDER: Lonni Vernetta, MD  REFERRING DIAG:  [phone] (ICD-10-CM) - Status post total replacement of right hip     S/p Rt THA Rationale for Evaluation and Treatment: Rehabilitation  THERAPY DIAG:  Chronic pain of left knee  Difficulty in walking, not elsewhere classified  Stiffness of left knee, not elsewhere classified  Muscle weakness (generalized)  Other abnormalities of gait and mobility  ONSET DATE: DOS 07/26/23   SUBJECTIVE:  SUBJECTIVE STATEMENT: The patient is stiff but moving. She is seeing the MD tomorrow.   Eval:Patient returns to therapy with a new prescription for her knee.  She reports she was doing fairly well but had an acute onset of significant knee pain towards the beginning of December.  She reports increased difficulty walking.  She reports the stiffness is increased since that period of time.  She has tried a things given to her from physical therapy and has seen no significant reduction in pain.  She continues to try to move is much as possible.  The patient reports that her hip is doing well.  PERTINENT HISTORY:  none  PAIN:  Are you having pain? Yes: NPRS scale: knee 8/10 Pain location: Rt laeral hip Pain description: tight, tender Aggravating factors: walking Relieving factors: rest  PRECAUTIONS:  Anterior hip  RED  FLAGS: None   WEIGHT BEARING RESTRICTIONS:  No  FALLS:  Has patient fallen in last 6 months? No  LIVING ENVIRONMENT: 4 steps to get into home, bedroom on main floor, has a second story.  OCCUPATION:  Next week to return to work   PLOF:  Independent  PATIENT GOALS:  Don my own sock and shoes. Preserve left knee to avoid TKA, play pickle ball   OBJECTIVE:  Note: Objective measures were completed at Evaluation unless otherwise noted.  PATIENT SURVEYS:  LEFS 28/80 LEFS 7/8 47/80 LEFS 12/17 15/80   COGNITIVE STATUS: Within functional limits for tasks assessed   SENSATION: WFL  EDEMA:  Yes: mild  POSTURE:  Lacking full hip extension bilaterally  GAIT: EVAL: antalgic lacking hip extension through stance phase/toe off   Body Part #1 Hip  PALPATION: Eval- edema and warmth  LOWER EXTREMITY ROM:     Active  Right eval Left eval Left 6/6 Left 8/5 Left  10/13 10/22 Left 12/17 Left 1/9  Hip flexion          Hip extension -12 -6        Hip abduction          Hip adduction          Hip internal rotation          Hip external rotation    95 103 106 62   Knee flexion        98  Knee extension 0 -8 -5 -5 -8 -5 -20     (Blank rows = not tested)     Gross strength   Left/ right  flexion 4+/4+ 8/26 flexion 5/5 Hip abduction 4+/4  8/26 5/4+ Knee extension 4+/4    8/26 5/4+       Strength testing not performed psych secondary to increased pain     Re-eval: Unable to assess 2nd to high levesl of pain     LOWER EXTREMITY MMT:    MMT Right 10/13  Left 10/13  Left   Hip flexion 18.8 15.3   Hip extension     Hip abduction 17.7 15.1   Hip adduction     Hip internal rotation     Hip external rotation     Knee flexion     Knee extension 20.9 23.2   Ankle dorsiflexion     Ankle plantarflexion     Ankle inversion     Ankle eversion      (Blank rows = not tested) not tested secondary to increased pain  TREATMENT DATE:  1/22 Manual: Grade 2 and 3 PA and AP mobilizations to reduce pain and improve mobility of the left knee.  Trigger point release to IT band.  Reviewed self trigger point release to IT band.   Self patellar mobilization Patella mobilization  Edema massage Mulligan mobilization for flexion in seated position  There-ex:  Quad sets 2x10  SLR 3x10  Sitting abd green band 3x12   1/9 Manual: Grade 2 and 3 PA and AP mobilizations to reduce pain and improve mobility of the left knee.  Trigger point release to IT band.  Reviewed self trigger point release to IT band.   Self patellar mobilization Patella mobilization  Edema massage Mulligan mobilization for flexion in seated position  There-ex:  Quad sets 2x10  SLR 3x10  Sitting abd green band 3x12    12/31 Manual: Grade 2 and 3 PA and AP mobilizations to reduce pain and improve mobility of the left knee.  Trigger point release to IT band.  Reviewed self trigger point release to IT band.   Self patellar mobilization Patella mobilization  Edema massage Mulligan mobilization for flexion in seated position  There-ex:  Quad sets 2x10  SLR 3x10    12/24 Manual: Grade 2 and 3 PA and AP mobilizations to reduce pain and improve mobility of the left knee.  Trigger point release to IT band.  Reviewed self trigger point release to IT band.   Self patellar mobilization Patella mobilization  Edema massage Mulligan mobilization for flexion in seated position  There-ex: nu-step 5 min   12/17 Manual: Grade 2 and 3 PA and AP mobilizations to reduce pain and improve mobility of the left knee.  Trigger point release to IT band.  Reviewed self trigger point release to IT band.   Self patellar mobilization Patella mobilization  Edema massage  Vaso Pneumatic medium 15 minutes 32 degrees   There-ex:  Quad sets 3x10  Reviewed use of the nu-step in a few days       10/31 Manual: Grade 2 and 3 PA and AP mobilizations to reduce pain and improve mobility of the left knee.  Trigger point release to IT band.  Reviewed self trigger point release to IT band.   Self patellar mobilization Patella mobilization  Edema massage  There-ex:  SLR 2x12 each leg   SAQ 3x12 3lbs on the right no weight on the left   There-act:  Standing weight shift 3x12  Step up 6 inch rright 2x10  4 inch left 2x10   10/29 Manual: Grade 2 and 3 PA and AP mobilizations to reduce pain and improve mobility of the left knee.  Trigger point release to IT band.  Reviewed self trigger point release to IT band.   Self patellar mobilization Patella mobilization  Edema massage  There-ex:  SLR 2x12 each leg   SAQ 3x12 3lbs on the right no weight on the left  Quad set 3x12 Hip abduction green 3x12   10/24 Manual: Grade 2 and 3 PA and AP mobilizations to reduce pain and improve mobility of the left knee.  Trigger point release to IT band.  Reviewed self trigger point release to IT band.   Self patellar mobilization Patella mobilization  Edema massage  There-ex:  SLR 2x12 each leg   SAQ 3x12 3lbs on the right no weight on the left   Neuromuscular reeducation: Heel toe rock 3 x 10  Slow march with weightbearing on right 3 x 10  PATIENT EDUCATION:  Education details: Teacher, Music of condition, POC, HEP, exercise form/rationale Person educated: Patient Education method: Explanation, Demonstration, Tactile cues, Verbal cues, and Handouts Education comprehension: verbalized understanding, returned demonstration, verbal cues required, tactile cues required, and needs further education  HOME EXERCISE PROGRAM: Access Code: [email or web address] URL: [email or web address]  ASSESSMENT:  CLINICAL IMPRESSION: The patients knee flexion was measured at 95 degrees today. Overall she is making progress with her ROM. She continues to have pain> She will se an orthopedic  tomorrow. She was advised to continue strengthening as much as able.   Eval: The patient returns to physical therapy following an acute onset of left knee pain.  She has been able to do her exercises to reduce pain.  Therapy focused manual therapy to reduce inflammation she has some difficulty even tolerating manual therapy today.  She did report mild improvement in pain and motion following physical therapy.  We reviewed light exercises today to begin trying.  She was also advised that she can move her knee on something like the NuStep in a pain-free range that might be helpful.  She would benefit from skilled therapy to improve range of motion, general functional mobility, and reduce strength.  Therapy trialed vasopneumatic icing today.  She did not like vasopneumatic device.  We will not perform again in the future .CLINICAL DECISION MAKING: Stable/uncomplicated  EVALUATION COMPLEXITY: Low   GOALS: Goals reviewed with patient? Yes  SHORT TERM GOALS: Target date: 01/21/2024    Patient will increase left knee flexion by 30 degrees baseline: Goal status: more limited today 12/24  2.  Patient will increase passive'knee extension 12 degrees Baseline:  Goal status:  3.  Patient will ambulate 500 feet without significant increase in left knee pain    LONG TERM GOALS: Target date: 02/18/2024    Stand as long as necessary for work with minimal discomfort Baseline:  Goal status: Initial  2.  LEFS to improve by MDC x2 Baseline:  Goal status: Initial 3.  Patient will go up and down 8 steps with reciprocal gait pattern without significant increase in left knee pain Goal status: Initial 4.  Easily don socks and shoes independently Baseline:  Goal status: Initial       PLAN:  PT FREQUENCY: 1-2x/week  PT DURATION:8 weeks  PLANNED INTERVENTIONS: 97164- PT Re-evaluation, 97750- Physical Performance Testing, 97110-Therapeutic exercises, 97530- Therapeutic activity, [id number]-  Neuromuscular re-education, 97535- Self Care, 02859- Manual therapy, [phone]- Gait training, [phone]- Aquatic Therapy, Patient/Family education, Balance training, Stair training, Taping, Dry Needling, Joint mobilization, Spinal mobilization, Scar mobilization, Cryotherapy, and Moist heat.  PLAN FOR NEXT SESSION: Continue with manual therapy to reduce acute inflammation and improve range of motion.  Progress back and exercises as tolerated. Alm Don PT DPT  05/22/24 12:52 PM    Referring diagnosis?  S03.358 (ICD-10-CM) - Status post total replacement of right hip    Treatment diagnosis? (if different than referring diagnosis) M25.551 What was this (referring dx) caused by? [x]  Surgery []  Fall []  Ongoing issue []  Arthritis []  Other: ____________  Laterality: [x]  Rt []  Lt []  Both  Check all possible CPT codes:  *CHOOSE 10 OR LESS*    See Planned Interventions listed in the Plan section of the Evaluation.     "

## 2024-05-22 ENCOUNTER — Encounter (HOSPITAL_BASED_OUTPATIENT_CLINIC_OR_DEPARTMENT_OTHER): Payer: Self-pay | Admitting: Physical Therapy

## 2024-05-24 ENCOUNTER — Encounter: Payer: Self-pay | Admitting: Internal Medicine

## 2024-05-24 NOTE — Progress Notes (Unsigned)
 "   Subjective:    Patient ID: Jaime Rose, female    DOB: 22-Jul-1950, 74 y.o.   MRN: [id number]      HPI Jaime Rose is here for a Physical exam and her chronic medical problems.   To have left knee replacement in February  She had a right hip replacement last year and is doing well.  One of her daughters got married last September and the other 1 is getting married in May.   Medications and allergies reviewed with patient and updated if appropriate.  Medications Ordered Prior to Encounter[1]  Review of Systems  Constitutional:  Negative for fever.  Eyes:  Negative for visual disturbance.  Respiratory:  Negative for cough, shortness of breath and wheezing.   Cardiovascular:  Negative for chest pain, palpitations and leg swelling.  Gastrointestinal:  Negative for abdominal pain, blood in stool, constipation and diarrhea.       No gerd  Genitourinary:  Negative for dysuria.  Musculoskeletal:  Positive for arthralgias (knees). Negative for back pain.  Skin:  Negative for rash.  Neurological:  Negative for light-headedness and headaches.  Psychiatric/Behavioral:  Negative for dysphoric mood. The patient is not nervous/anxious.         Objective:   Vitals:   05/27/24 1453  BP: 136/80  Pulse: 72  Temp: 98 F (36.7 C)  SpO2: 100%   Filed Weights   05/27/24 1453  Weight: 161 lb (73 kg)   Body mass index is 26.79 kg/m.  BP Readings from Last 3 Encounters:  05/27/24 136/80  10/12/23 (!) 167/70  07/28/23 131/76    Wt Readings from Last 3 Encounters:  05/27/24 161 lb (73 kg)  07/26/23 158 lb 15.9 oz (72.1 kg)  07/22/23 159 lb (72.1 kg)       Physical Exam Constitutional: She appears well-developed and well-nourished. No distress.  HENT:  Head: Normocephalic and atraumatic.  Right Ear: External ear normal. Normal ear canal and TM Left Ear: External ear normal.  Normal ear canal and TM Mouth/Throat: Oropharynx is clear and moist.  Eyes: Conjunctivae  normal.  Neck: Neck supple. No tracheal deviation present. No thyromegaly present.  No carotid bruit  Cardiovascular: Normal rate, regular rhythm and normal heart sounds.   2/6 systolic murmur heard.  No edema. Pulmonary/Chest: Effort normal and breath sounds normal. No respiratory distress. She has no wheezes. She has no rales.  Breast: deferred   Abdominal: Soft. She exhibits no distension. There is no tenderness.  Lymphadenopathy: She has no cervical adenopathy.  Skin: Skin is warm and dry. She is not diaphoretic.  Psychiatric: She has a normal mood and affect. Her behavior is normal.     Lab Results  Component Value Date   WBC 7.0 10/12/2023   HGB 10.9 (L) 10/12/2023   HCT 33.3 (L) 10/12/2023   PLT 305 10/12/2023   GLUCOSE 148 (H) 10/12/2023   CHOL 226 (H) 05/24/2023   TRIG 51.0 05/24/2023   HDL 90.30 05/24/2023   LDLDIRECT 104.9 04/23/2007   LDLCALC 125 (H) 05/24/2023   ALT 12 10/12/2023   AST 16 10/12/2023   NA 141 10/12/2023   K 3.8 10/12/2023   CL 104 10/12/2023   CREATININE 0.61 10/12/2023   BUN 22 10/12/2023   CO2 24 10/12/2023   TSH 0.97 09/27/2023   HGBA1C 5.7 05/24/2023         Assessment & Plan:   Physical exam: Screening blood work  ordered Exercise  does machines at the gym, doing  PT once a week Weight  is good Substance abuse  none   Reviewed recommended immunizations.   Health Maintenance  Topic Date Due   Bone Density Scan  10/11/2023   Medicare Annual Wellness (AWV)  12/14/2023   COVID-19 Vaccine (4 - 2025-26 season) 06/09/2024 (Originally 12/30/2023)   DTaP/Tdap/Td (2 - Td or Tdap) 06/06/2025   Mammogram  12/17/2025   Fecal DNA (Cologuard)  12/17/2026   Pneumococcal Vaccine: 50+ Years  Completed   Influenza Vaccine  Completed   Hepatitis C Screening  Completed   Zoster Vaccines- Shingrix   Completed   Meningococcal B Vaccine  Aged Out      DEXA ordered    See Problem List for Assessment and Plan of chronic medical  problems.        [1]  Current Outpatient Medications on File Prior to Visit  Medication Sig Dispense Refill   acetaminophen  (TYLENOL ) 650 MG CR tablet Take 1,300 mg by mouth 2 (two) times daily.     Calcium -Vitamin D -Vitamin K (VIACTIV CALCIUM  PLUS D) 650-12.5-40 MG-MCG-MCG CHEW Chew 1 Piece by mouth daily.     cholecalciferol  (VITAMIN D3) 25 MCG (1000 UNIT) tablet Take 1,000 Units by mouth daily.     levothyroxine  (SYNTHROID ) 112 MCG tablet TAKE 1 TABLET BY MOUTH EVERY MORNING BEFORE BREAK BEFORE BREAKFAST 6 DAYS PER WEEK THEN TAKE 2 TABLETS DAILY FOR 1 DAY PER WEEK AS DIRECTED 104 tablet 0   loratadine  (CLARITIN ) 10 MG tablet Take 10 mg by mouth daily.     RESTASIS  0.05 % ophthalmic emulsion Place 1 drop into both eyes 2 (two) times daily.     No current facility-administered medications on file prior to visit.   "

## 2024-05-24 NOTE — Patient Instructions (Addendum)
 "     Blood work was ordered.       Medications changes include :   None    A referral was ordered and someone will call you to schedule an appointment.     Return in about 1 year (around 05/27/2025) for Physical Exam.   Health Maintenance, Female Adopting a healthy lifestyle and getting preventive care are important in promoting health and wellness. Ask your health care provider about: The right schedule for you to have regular tests and exams. Things you can do on your own to prevent diseases and keep yourself healthy. What should I know about diet, weight, and exercise? Eat a healthy diet  Eat a diet that includes plenty of vegetables, fruits, low-fat dairy products, and lean protein. Do not eat a lot of foods that are high in solid fats, added sugars, or sodium. Maintain a healthy weight Body mass index (BMI) is used to identify weight problems. It estimates body fat based on height and weight. Your health care provider can help determine your BMI and help you achieve or maintain a healthy weight. Get regular exercise Get regular exercise. This is one of the most important things you can do for your health. Most adults should: Exercise for at least 150 minutes each week. The exercise should increase your heart rate and make you sweat (moderate-intensity exercise). Do strengthening exercises at least twice a week. This is in addition to the moderate-intensity exercise. Spend less time sitting. Even light physical activity can be beneficial. Watch cholesterol and blood lipids Have your blood tested for lipids and cholesterol at 72 years of age, then have this test every 5 years. Have your cholesterol levels checked more often if: Your lipid or cholesterol levels are high. You are older than 72 years of age. You are at high risk for heart disease. What should I know about cancer screening? Depending on your health history and family history, you may need to have cancer  screening at various ages. This may include screening for: Breast cancer. Cervical cancer. Colorectal cancer. Skin cancer. Lung cancer. What should I know about heart disease, diabetes, and high blood pressure? Blood pressure and heart disease High blood pressure causes heart disease and increases the risk of stroke. This is more likely to develop in people who have high blood pressure readings or are overweight. Have your blood pressure checked: Every 3-5 years if you are 37-39 years of age. Every year if you are 23 years old or older. Diabetes Have regular diabetes screenings. This checks your fasting blood sugar level. Have the screening done: Once every three years after age 40 if you are at a normal weight and have a low risk for diabetes. More often and at a younger age if you are overweight or have a high risk for diabetes. What should I know about preventing infection? Hepatitis B If you have a higher risk for hepatitis B, you should be screened for this virus. Talk with your health care provider to find out if you are at risk for hepatitis B infection. Hepatitis C Testing is recommended for: Everyone born from [birth year] through 1963. Anyone with known risk factors for hepatitis C. Sexually transmitted infections (STIs) Get screened for STIs, including gonorrhea and chlamydia, if: You are sexually active and are younger than 72 years of age. You are older than 72 years of age and your health care provider tells you that you are at risk for this type of infection. Your sexual activity  has changed since you were last screened, and you are at increased risk for chlamydia or gonorrhea. Ask your health care provider if you are at risk. Ask your health care provider about whether you are at high risk for HIV. Your health care provider may recommend a prescription medicine to help prevent HIV infection. If you choose to take medicine to prevent HIV, you should first get tested for HIV. You  should then be tested every 3 months for as long as you are taking the medicine. Pregnancy If you are about to stop having your period (premenopausal) and you may become pregnant, seek counseling before you get pregnant. Take 400 to 800 micrograms (mcg) of folic acid every day if you become pregnant. Ask for birth control (contraception) if you want to prevent pregnancy. Osteoporosis and menopause Osteoporosis is a disease in which the bones lose minerals and strength with aging. This can result in bone fractures. If you are 53 years old or older, or if you are at risk for osteoporosis and fractures, ask your health care provider if you should: Be screened for bone loss. Take a calcium  or vitamin D  supplement to lower your risk of fractures. Be given hormone replacement therapy (HRT) to treat symptoms of menopause. Follow these instructions at home: Alcohol use Do not drink alcohol if: Your health care provider tells you not to drink. You are pregnant, may be pregnant, or are planning to become pregnant. If you drink alcohol: Limit how much you have to: 0-1 drink a day. Know how much alcohol is in your drink. In the [place] one drink equals one 12 oz bottle of beer (355 mL), one 5 oz glass of wine (148 mL), or one 1 oz glass of hard liquor (44 mL). Lifestyle Do not use any products that contain nicotine or tobacco. These products include cigarettes, chewing tobacco, and vaping devices, such as e-cigarettes. If you need help quitting, ask your health care provider. Do not use street drugs. Do not share needles. Ask your health care provider for help if you need support or information about quitting drugs. General instructions Schedule regular health, dental, and eye exams. Stay current with your vaccines. Tell your health care provider if: You often feel depressed. You have ever been abused or do not feel safe at home. Summary Adopting a healthy lifestyle and getting preventive care are  important in promoting health and wellness. Follow your health care provider's instructions about healthy diet, exercising, and getting tested or screened for diseases. Follow your health care provider's instructions on monitoring your cholesterol and blood pressure. This information is not intended to replace advice given to you by your health care provider. Make sure you discuss any questions you have with your health care provider. Document Revised: 09/05/2020 Document Reviewed: 09/05/2020 Elsevier Patient Education  2024 [employer]. "

## 2024-05-27 ENCOUNTER — Ambulatory Visit (HOSPITAL_BASED_OUTPATIENT_CLINIC_OR_DEPARTMENT_OTHER): Payer: Self-pay | Admitting: Physical Therapy

## 2024-05-27 ENCOUNTER — Encounter (HOSPITAL_BASED_OUTPATIENT_CLINIC_OR_DEPARTMENT_OTHER): Payer: Self-pay | Admitting: Physical Therapy

## 2024-05-27 ENCOUNTER — Ambulatory Visit: Admitting: Internal Medicine

## 2024-05-27 VITALS — BP 136/80 | HR 72 | Temp 98.0°F | Ht 65.0 in | Wt 161.0 lb

## 2024-05-27 DIAGNOSIS — E559 Vitamin D deficiency, unspecified: Secondary | ICD-10-CM | POA: Diagnosis not present

## 2024-05-27 DIAGNOSIS — Z13820 Encounter for screening for osteoporosis: Secondary | ICD-10-CM | POA: Diagnosis not present

## 2024-05-27 DIAGNOSIS — R7303 Prediabetes: Secondary | ICD-10-CM | POA: Diagnosis not present

## 2024-05-27 DIAGNOSIS — Z0000 Encounter for general adult medical examination without abnormal findings: Secondary | ICD-10-CM

## 2024-05-27 DIAGNOSIS — E039 Hypothyroidism, unspecified: Secondary | ICD-10-CM

## 2024-05-27 DIAGNOSIS — M85859 Other specified disorders of bone density and structure, unspecified thigh: Secondary | ICD-10-CM | POA: Diagnosis not present

## 2024-05-27 DIAGNOSIS — E2839 Other primary ovarian failure: Secondary | ICD-10-CM | POA: Diagnosis not present

## 2024-05-27 DIAGNOSIS — G8929 Other chronic pain: Secondary | ICD-10-CM

## 2024-05-27 DIAGNOSIS — R2689 Other abnormalities of gait and mobility: Secondary | ICD-10-CM

## 2024-05-27 DIAGNOSIS — M25662 Stiffness of left knee, not elsewhere classified: Secondary | ICD-10-CM

## 2024-05-27 DIAGNOSIS — R262 Difficulty in walking, not elsewhere classified: Secondary | ICD-10-CM

## 2024-05-27 DIAGNOSIS — M6281 Muscle weakness (generalized): Secondary | ICD-10-CM

## 2024-05-27 DIAGNOSIS — E7800 Pure hypercholesterolemia, unspecified: Secondary | ICD-10-CM | POA: Diagnosis not present

## 2024-05-27 DIAGNOSIS — M25562 Pain in left knee: Secondary | ICD-10-CM | POA: Diagnosis not present

## 2024-05-27 LAB — COMPREHENSIVE METABOLIC PANEL WITH GFR
ALT: 10 U/L (ref 3–35)
AST: 12 U/L (ref 5–37)
Albumin: 3.8 g/dL (ref 3.5–5.2)
Alkaline Phosphatase: 85 U/L (ref 39–117)
BUN: 15 mg/dL (ref 6–23)
CO2: 32 meq/L (ref 19–32)
Calcium: 9.6 mg/dL (ref 8.4–10.5)
Chloride: 102 meq/L (ref 96–112)
Creatinine, Ser: 0.55 mg/dL (ref 0.40–1.20)
GFR: 92.2 mL/min
Glucose, Bld: 85 mg/dL (ref 70–99)
Potassium: 3.9 meq/L (ref 3.5–5.1)
Sodium: 139 meq/L (ref 135–145)
Total Bilirubin: 0.3 mg/dL (ref 0.2–1.2)
Total Protein: 8 g/dL (ref 6.0–8.3)

## 2024-05-27 LAB — LIPID PANEL
Cholesterol: 184 mg/dL (ref 28–200)
HDL: 73.4 mg/dL
LDL Cholesterol: 99 mg/dL (ref 10–99)
NonHDL: 110.19
Total CHOL/HDL Ratio: 3
Triglycerides: 54 mg/dL (ref 10.0–149.0)
VLDL: 10.8 mg/dL (ref 0.0–40.0)

## 2024-05-27 LAB — CBC
HCT: 33.9 % — ABNORMAL LOW (ref 36.0–46.0)
Hemoglobin: 11.3 g/dL — ABNORMAL LOW (ref 12.0–15.0)
MCHC: 33.3 g/dL (ref 30.0–36.0)
MCV: 82.9 fl (ref 78.0–100.0)
Platelets: 363 10*3/uL (ref 150.0–400.0)
RBC: 4.09 Mil/uL (ref 3.87–5.11)
RDW: 14.9 % (ref 11.5–15.5)
WBC: 6.8 10*3/uL (ref 4.0–10.5)

## 2024-05-27 LAB — HEMOGLOBIN A1C: Hgb A1c MFr Bld: 5.9 % (ref 4.6–6.5)

## 2024-05-27 NOTE — Therapy (Signed)
 " OUTPATIENT PHYSICAL THERAPY  Progress Note Reporting Period 10/06/2023 to 12/03/2023  See note below for Objective Data and Assessment of Progress/Goals.        Patient Name: Jaime Rose MRN: [id number] DOB:Mar 06, 1951, 74 y.o., female Today's Date: 05/27/2024  END OF SESSION:  PT End of Session - 05/27/24 1345     Visit Number 6    Number of Visits 16    Date for Recertification  06/11/24    PT Start Time 1304    PT Stop Time 1345    PT Time Calculation (min) 41 min    Activity Tolerance Patient tolerated treatment well    Behavior During Therapy [facility] for tasks assessed/performed                              Past Medical History:  Diagnosis Date   ALLERGIC RHINITIS    Allergy 1985   seasonal allergies   Arthritis    CHICKENPOX, HX OF 12/21/2009   Qualifier: Diagnosis of  By: Inocencio MD, Berwyn DELENA    Heart murmur    MR   Hyperthyroidism    normal TFTs 11/2009 , dx 10/2011   Pre-diabetes    Varicose vein    s/p sclerosis tx summer 2013   Past Surgical History:  Procedure Laterality Date   ENDOVENOUS ABLATION SAPHENOUS VEIN W/ LASER  04/11/2012   endovenous laser ablation right greater saphenous vein and stab phlebectomy  right leg  by Krystal Doing MD   NO PAST SURGERIES     TOTAL HIP ARTHROPLASTY Right 07/26/2023   Procedure: ARTHROPLASTY, HIP, TOTAL, ANTERIOR APPROACH;  Surgeon: Vernetta Lonni GRADE, MD;  Location: [facility];  Service: Orthopedics;  Laterality: Right;   Patient Active Problem List   Diagnosis Date Noted   Unilateral primary osteoarthritis, right knee 03/12/2024   Unilateral primary osteoarthritis, left knee 03/12/2024   Status post total replacement of right hip 07/26/2023   Hypercholesterolemia 07/17/2023   Elevated coronary artery calcium  score 07/17/2023   Mitral regurgitation, moderate-possibly severe 07/11/2023   Vitamin D  deficiency 05/23/2023   Foot pain, left 06/02/2021   Ptosis of right eyelid 04/12/2020    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 12/29/2018   Osteopenia 01/10/2017   Carotid artery stenosis, asymptomatic, bilateral 12/21/2016   Prediabetes 12/21/2016   Hypothyroidism 03/20/2012   Varicose veins of bilateral lower extremities with other complications 12/21/2011   Allergic rhinitis 12/21/2009       REFERRING PROVIDER: Lonni Vernetta, MD  REFERRING DIAG:  [phone] (ICD-10-CM) - Status post total replacement of right hip     S/p Rt THA Rationale for Evaluation and Treatment: Rehabilitation  THERAPY DIAG:  Chronic pain of left knee  Difficulty in walking, not elsewhere classified  Stiffness of left knee, not elsewhere classified  Muscle weakness (generalized)  Other abnormalities of gait and mobility  ONSET DATE: DOS 07/26/23   SUBJECTIVE:  SUBJECTIVE STATEMENT: Patient went to see the MD this past weekend and discussed options with patient. Pt is going up next weekend for a pre-op visit and is scheduled to get her surgery February 24th. Pt came in with questions on when to get started with PT following the surgery and PT advised her to start scheduling PT for after her surgery now.   Eval:Patient returns to therapy with a new prescription for her knee.  She reports she was doing fairly well but had an acute onset of significant knee pain towards the beginning of December.  She reports increased difficulty walking.  She reports the stiffness is increased since that period of time.  She has tried a things given to her from physical therapy and has seen no significant reduction in pain.  She continues to try to move is much as possible.  The patient reports that her hip is doing well.  PERTINENT HISTORY:  none  PAIN:  Are you having pain? Yes: NPRS scale: knee 8/10 Pain location: Rt laeral  hip Pain description: tight, tender Aggravating factors: walking Relieving factors: rest  PRECAUTIONS:  Anterior hip  RED FLAGS: None   WEIGHT BEARING RESTRICTIONS:  No  FALLS:  Has patient fallen in last 6 months? No  LIVING ENVIRONMENT: 4 steps to get into home, bedroom on main floor, has a second story.  OCCUPATION:  Next week to return to work   PLOF:  Independent  PATIENT GOALS:  Don my own sock and shoes. Preserve left knee to avoid TKA, play pickle ball   OBJECTIVE:  Note: Objective measures were completed at Evaluation unless otherwise noted.  PATIENT SURVEYS:  LEFS 28/80 LEFS 7/8 47/80 LEFS 12/17 15/80   COGNITIVE STATUS: Within functional limits for tasks assessed   SENSATION: WFL  EDEMA:  Yes: mild  POSTURE:  Lacking full hip extension bilaterally  GAIT: EVAL: antalgic lacking hip extension through stance phase/toe off   Body Part #1 Hip  PALPATION: Eval- edema and warmth  LOWER EXTREMITY ROM:     Active  Right eval Left eval Left 6/6 Left 8/5 Left  10/13 10/22 Left 12/17 Left 1/9  Hip flexion          Hip extension -12 -6        Hip abduction          Hip adduction          Hip internal rotation          Hip external rotation    95 103 106 62   Knee flexion        98  Knee extension 0 -8 -5 -5 -8 -5 -20     (Blank rows = not tested)     Gross strength   Left/ right  flexion 4+/4+ 8/26 flexion 5/5 Hip abduction 4+/4  8/26 5/4+ Knee extension 4+/4    8/26 5/4+       Strength testing not performed psych secondary to increased pain     Re-eval: Unable to assess 2nd to high levesl of pain     LOWER EXTREMITY MMT:    MMT Right 10/13  Left 10/13  Left   Hip flexion 18.8 15.3   Hip extension     Hip abduction 17.7 15.1   Hip adduction     Hip internal rotation     Hip external rotation     Knee flexion     Knee extension 20.9 23.2   Ankle dorsiflexion  Ankle plantarflexion     Ankle inversion      Ankle eversion      (Blank rows = not tested) not tested secondary to increased pain                                                                                                               TREATMENT DATE:  1/28 Pt ed: Answering PT questions re: upcoming knee surgery   There-ex: WU on nustep for 5 mins  Quad press into ball 3x10 bilat  SLR 3x10 bilat  SL SLR 2x10 left leg  SAQ 2 lb 2x10  WB steps UE support 3x10  WB foot rocks 2x10   Manual: Grade 2 and 3 PA and AP mobs to reduce pain and improve mobility of the left knee TP release to IT band and review self TP release to IT Edema massage  Pt reached to 100 degrees PROM   1/22 Manual: Grade 2 and 3 PA and AP mobilizations to reduce pain and improve mobility of the left knee.  Trigger point release to IT band.  Reviewed self trigger point release to IT band.   Self patellar mobilization Patella mobilization  Edema massage Mulligan mobilization for flexion in seated position  There-ex:  Quad sets 2x10  SLR 3x10  Sitting abd green band 3x12   1/9 Manual: Grade 2 and 3 PA and AP mobilizations to reduce pain and improve mobility of the left knee.  Trigger point release to IT band.  Reviewed self trigger point release to IT band.   Self patellar mobilization Patella mobilization  Edema massage Mulligan mobilization for flexion in seated position  There-ex:  Quad sets 2x10  SLR 3x10  Sitting abd green band 3x12    12/31 Manual: Grade 2 and 3 PA and AP mobilizations to reduce pain and improve mobility of the left knee.  Trigger point release to IT band.  Reviewed self trigger point release to IT band.   Self patellar mobilization Patella mobilization  Edema massage Mulligan mobilization for flexion in seated position  There-ex:  Quad sets 2x10  SLR 3x10    12/24 Manual: Grade 2 and 3 PA and AP mobilizations to reduce pain and improve mobility of the left knee.  Trigger point release to IT band.   Reviewed self trigger point release to IT band.   Self patellar mobilization Patella mobilization  Edema massage Mulligan mobilization for flexion in seated position  There-ex: nu-step 5 min   12/17 Manual: Grade 2 and 3 PA and AP mobilizations to reduce pain and improve mobility of the left knee.  Trigger point release to IT band.  Reviewed self trigger point release to IT band.   Self patellar mobilization Patella mobilization  Edema massage  Vaso Pneumatic medium 15 minutes 32 degrees   There-ex:  Quad sets 3x10  Reviewed use of the nu-step in a few days      10/31 Manual: Grade 2 and 3 PA and AP mobilizations to reduce pain and improve mobility of  the left knee.  Trigger point release to IT band.  Reviewed self trigger point release to IT band.   Self patellar mobilization Patella mobilization  Edema massage  There-ex:  SLR 2x12 each leg   SAQ 3x12 3lbs on the right no weight on the left   There-act:  Standing weight shift 3x12  Step up 6 inch rright 2x10  4 inch left 2x10   10/29 Manual: Grade 2 and 3 PA and AP mobilizations to reduce pain and improve mobility of the left knee.  Trigger point release to IT band.  Reviewed self trigger point release to IT band.   Self patellar mobilization Patella mobilization  Edema massage  There-ex:  SLR 2x12 each leg   SAQ 3x12 3lbs on the right no weight on the left  Quad set 3x12 Hip abduction green 3x12   10/24 Manual: Grade 2 and 3 PA and AP mobilizations to reduce pain and improve mobility of the left knee.  Trigger point release to IT band.  Reviewed self trigger point release to IT band.   Self patellar mobilization Patella mobilization  Edema massage  There-ex:  SLR 2x12 each leg   SAQ 3x12 3lbs on the right no weight on the left   Neuromuscular reeducation: Heel toe rock 3 x 10  Slow march with weightbearing on right 3 x 10     PATIENT EDUCATION:  Education details: Teacher, Music of condition, POC, HEP,  exercise form/rationale Person educated: Patient Education method: Explanation, Demonstration, Tactile cues, Verbal cues, and Handouts Education comprehension: verbalized understanding, returned demonstration, verbal cues required, tactile cues required, and needs further education  HOME EXERCISE PROGRAM: Access Code: [email or web address] URL: [email or web address]  ASSESSMENT:  CLINICAL IMPRESSION: The patients knee flexion was measured at 100 degrees today. Overall she is making progress with her ROM. She continues to have pain but has a surgery scheduled for February 24th. At this point, pt will benefit from strengthening the knee and glut AMAP before surgery to help with post-surgical outcomes.   Eval: The patient returns to physical therapy following an acute onset of left knee pain.  She has been able to do her exercises to reduce pain.  Therapy focused manual therapy to reduce inflammation she has some difficulty even tolerating manual therapy today.  She did report mild improvement in pain and motion following physical therapy.  We reviewed light exercises today to begin trying.  She was also advised that she can move her knee on something like the NuStep in a pain-free range that might be helpful.  She would benefit from skilled therapy to improve range of motion, general functional mobility, and reduce strength.  Therapy trialed vasopneumatic icing today.  She did not like vasopneumatic device.  We will not perform again in the future .CLINICAL DECISION MAKING: Stable/uncomplicated  EVALUATION COMPLEXITY: Low   GOALS: Goals reviewed with patient? Yes  SHORT TERM GOALS: Target date: 01/21/2024    Patient will increase left knee flexion by 30 degrees baseline: Goal status: more limited today 12/24  2.  Patient will increase passive'knee extension 12 degrees Baseline:  Goal status:  3.  Patient will ambulate 500 feet without significant increase in left knee pain    LONG  TERM GOALS: Target date: 02/18/2024    Stand as long as necessary for work with minimal discomfort Baseline:  Goal status: Initial  2.  LEFS to improve by MDC x2 Baseline:  Goal status: Initial 3.  Patient will go up and down 8 steps with  reciprocal gait pattern without significant increase in left knee pain Goal status: Initial 4.  Easily don socks and shoes independently Baseline:  Goal status: Initial       PLAN:  PT FREQUENCY: 1-2x/week  PT DURATION:8 weeks  PLANNED INTERVENTIONS: 97164- PT Re-evaluation, 97750- Physical Performance Testing, 97110-Therapeutic exercises, 97530- Therapeutic activity, [id number]- Neuromuscular re-education, 97535- Self Care, [id number]- Manual therapy, [phone]- Gait training, [phone]- Aquatic Therapy, Patient/Family education, Balance training, Stair training, Taping, Dry Needling, Joint mobilization, Spinal mobilization, Scar mobilization, Cryotherapy, and Moist heat.  PLAN FOR NEXT SESSION: Continue with manual therapy to reduce acute inflammation and improve range of motion.  Progress back and exercises as tolerated. Alm Don PT DPT  05/27/24 4:20 PM    Referring diagnosis?  S03.358 (ICD-10-CM) - Status post total replacement of right hip    Treatment diagnosis? (if different than referring diagnosis) M25.551 What was this (referring dx) caused by? [x]  Surgery []  Fall []  Ongoing issue []  Arthritis []  Other: ____________  Laterality: [x]  Rt []  Lt []  Both  Check all possible CPT codes:  *CHOOSE 10 OR LESS*    See Planned Interventions listed in the Plan section of the Evaluation.     "

## 2024-05-28 ENCOUNTER — Ambulatory Visit: Payer: Self-pay | Admitting: Internal Medicine

## 2024-05-28 LAB — VITAMIN D 25 HYDROXY (VIT D DEFICIENCY, FRACTURES): VITD: 27.25 ng/mL — ABNORMAL LOW (ref 30.00–100.00)

## 2024-05-28 LAB — TSH: TSH: 2.92 u[IU]/mL (ref 0.35–5.50)

## 2024-06-05 ENCOUNTER — Ambulatory Visit (HOSPITAL_BASED_OUTPATIENT_CLINIC_OR_DEPARTMENT_OTHER): Attending: Orthopaedic Surgery | Admitting: Physical Therapy

## 2024-06-05 ENCOUNTER — Encounter (HOSPITAL_BASED_OUTPATIENT_CLINIC_OR_DEPARTMENT_OTHER): Payer: Self-pay | Admitting: Physical Therapy

## 2024-06-05 NOTE — Therapy (Incomplete)
 " OUTPATIENT PHYSICAL THERAPY  Progress Note Reporting Period 10/06/2023 to 12/03/2023  See note below for Objective Data and Assessment of Progress/Goals.        Patient Name: Jaime Rose MRN: [id number] DOB:11/26/1950, 74 y.o., female Today's Date: 06/05/2024  END OF SESSION:                        Past Medical History:  Diagnosis Date   ALLERGIC RHINITIS    Allergy 1985   seasonal allergies   Arthritis    CHICKENPOX, HX OF 12/21/2009   Qualifier: Diagnosis of  By: Inocencio MD, Berwyn DELENA    Heart murmur    MR   Hyperthyroidism    normal TFTs 11/2009 , dx 10/2011   Pre-diabetes    Varicose vein    s/p sclerosis tx summer 2013   Past Surgical History:  Procedure Laterality Date   ENDOVENOUS ABLATION SAPHENOUS VEIN W/ LASER  04/11/2012   endovenous laser ablation right greater saphenous vein and stab phlebectomy  right leg  by Krystal Doing MD   NO PAST SURGERIES     TOTAL HIP ARTHROPLASTY Right 07/26/2023   Procedure: ARTHROPLASTY, HIP, TOTAL, ANTERIOR APPROACH;  Surgeon: Vernetta Lonni GRADE, MD;  Location: [facility];  Service: Orthopedics;  Laterality: Right;   Patient Active Problem List   Diagnosis Date Noted   Unilateral primary osteoarthritis, right knee 03/12/2024   Unilateral primary osteoarthritis, left knee 03/12/2024   Status post total replacement of right hip 07/26/2023   Hypercholesterolemia 07/17/2023   Elevated coronary artery calcium  score 07/17/2023   Mitral regurgitation, moderate-possibly severe 07/11/2023   Vitamin D  deficiency 05/23/2023   Foot pain, left 06/02/2021   Ptosis of right eyelid 04/12/2020   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 12/29/2018   Osteopenia 01/10/2017   Carotid artery stenosis, asymptomatic, bilateral 12/21/2016   Prediabetes 12/21/2016   Hypothyroidism 03/20/2012   Varicose veins of bilateral lower extremities with other complications 12/21/2011   Allergic rhinitis 12/21/2009       REFERRING  PROVIDER: Lonni Vernetta, MD  REFERRING DIAG:  [phone] (ICD-10-CM) - Status post total replacement of right hip     S/p Rt THA Rationale for Evaluation and Treatment: Rehabilitation  THERAPY DIAG:  No diagnosis found.  ONSET DATE: DOS 07/26/23   SUBJECTIVE:                                                                                                                                                                                           SUBJECTIVE STATEMENT: Patient went to see the MD this past weekend and discussed options with  patient. Pt is going up next weekend for a pre-op visit and is scheduled to get her surgery February 24th. Pt came in with questions on when to get started with PT following the surgery and PT advised her to start scheduling PT for after her surgery now.   Eval:Patient returns to therapy with a new prescription for her knee.  She reports she was doing fairly well but had an acute onset of significant knee pain towards the beginning of December.  She reports increased difficulty walking.  She reports the stiffness is increased since that period of time.  She has tried a things given to her from physical therapy and has seen no significant reduction in pain.  She continues to try to move is much as possible.  The patient reports that her hip is doing well.  PERTINENT HISTORY:  none  PAIN:  Are you having pain? Yes: NPRS scale: knee 8/10 Pain location: Rt laeral hip Pain description: tight, tender Aggravating factors: walking Relieving factors: rest  PRECAUTIONS:  Anterior hip  RED FLAGS: None   WEIGHT BEARING RESTRICTIONS:  No  FALLS:  Has patient fallen in last 6 months? No  LIVING ENVIRONMENT: 4 steps to get into home, bedroom on main floor, has a second story.  OCCUPATION:  Next week to return to work   PLOF:  Independent  PATIENT GOALS:  Don my own sock and shoes. Preserve left knee to avoid TKA, play pickle ball   OBJECTIVE:   Note: Objective measures were completed at Evaluation unless otherwise noted.  PATIENT SURVEYS:  LEFS 28/80 LEFS 7/8 47/80 LEFS 12/17 15/80   COGNITIVE STATUS: Within functional limits for tasks assessed   SENSATION: WFL  EDEMA:  Yes: mild  POSTURE:  Lacking full hip extension bilaterally  GAIT: EVAL: antalgic lacking hip extension through stance phase/toe off   Body Part #1 Hip  PALPATION: Eval- edema and warmth  LOWER EXTREMITY ROM:     Active  Right eval Left eval Left 6/6 Left 8/5 Left  10/13 10/22 Left 12/17 Left 1/9  Hip flexion          Hip extension -12 -6        Hip abduction          Hip adduction          Hip internal rotation          Hip external rotation    95 103 106 62   Knee flexion        98  Knee extension 0 -8 -5 -5 -8 -5 -20     (Blank rows = not tested)     Gross strength   Left/ right  flexion 4+/4+ 8/26 flexion 5/5 Hip abduction 4+/4  8/26 5/4+ Knee extension 4+/4    8/26 5/4+       Strength testing not performed psych secondary to increased pain     Re-eval: Unable to assess 2nd to high levesl of pain     LOWER EXTREMITY MMT:    MMT Right 10/13  Left 10/13  Left   Hip flexion 18.8 15.3   Hip extension     Hip abduction 17.7 15.1   Hip adduction     Hip internal rotation     Hip external rotation     Knee flexion     Knee extension 20.9 23.2   Ankle dorsiflexion     Ankle plantarflexion     Ankle inversion     Ankle  eversion      (Blank rows = not tested) not tested secondary to increased pain                                                                                                               TREATMENT DATE:  1/28 Pt ed: Answering PT questions re: upcoming knee surgery   There-ex: WU on nustep for 5 mins  Quad press into ball 3x10 bilat  SLR 3x10 bilat  SL SLR 2x10 left leg  SAQ 2 lb 2x10  WB steps UE support 3x10  WB foot rocks 2x10   Manual: Grade 2 and 3 PA and AP mobs to reduce  pain and improve mobility of the left knee TP release to IT band and review self TP release to IT Edema massage  Pt reached to 100 degrees PROM   1/22 Manual: Grade 2 and 3 PA and AP mobilizations to reduce pain and improve mobility of the left knee.  Trigger point release to IT band.  Reviewed self trigger point release to IT band.   Self patellar mobilization Patella mobilization  Edema massage Mulligan mobilization for flexion in seated position  There-ex:  Quad sets 2x10  SLR 3x10  Sitting abd green band 3x12   1/9 Manual: Grade 2 and 3 PA and AP mobilizations to reduce pain and improve mobility of the left knee.  Trigger point release to IT band.  Reviewed self trigger point release to IT band.   Self patellar mobilization Patella mobilization  Edema massage Mulligan mobilization for flexion in seated position  There-ex:  Quad sets 2x10  SLR 3x10  Sitting abd green band 3x12    12/31 Manual: Grade 2 and 3 PA and AP mobilizations to reduce pain and improve mobility of the left knee.  Trigger point release to IT band.  Reviewed self trigger point release to IT band.   Self patellar mobilization Patella mobilization  Edema massage Mulligan mobilization for flexion in seated position  There-ex:  Quad sets 2x10  SLR 3x10    12/24 Manual: Grade 2 and 3 PA and AP mobilizations to reduce pain and improve mobility of the left knee.  Trigger point release to IT band.  Reviewed self trigger point release to IT band.   Self patellar mobilization Patella mobilization  Edema massage Mulligan mobilization for flexion in seated position  There-ex: nu-step 5 min   12/17 Manual: Grade 2 and 3 PA and AP mobilizations to reduce pain and improve mobility of the left knee.  Trigger point release to IT band.  Reviewed self trigger point release to IT band.   Self patellar mobilization Patella mobilization  Edema massage  Vaso Pneumatic medium 15 minutes 32 degrees    There-ex:  Quad sets 3x10  Reviewed use of the nu-step in a few days      10/31 Manual: Grade 2 and 3 PA and AP mobilizations to reduce pain and improve mobility of the left knee.  Trigger point release to IT band.  Reviewed self  trigger point release to IT band.   Self patellar mobilization Patella mobilization  Edema massage  There-ex:  SLR 2x12 each leg   SAQ 3x12 3lbs on the right no weight on the left   There-act:  Standing weight shift 3x12  Step up 6 inch rright 2x10  4 inch left 2x10   10/29 Manual: Grade 2 and 3 PA and AP mobilizations to reduce pain and improve mobility of the left knee.  Trigger point release to IT band.  Reviewed self trigger point release to IT band.   Self patellar mobilization Patella mobilization  Edema massage  There-ex:  SLR 2x12 each leg   SAQ 3x12 3lbs on the right no weight on the left  Quad set 3x12 Hip abduction green 3x12   10/24 Manual: Grade 2 and 3 PA and AP mobilizations to reduce pain and improve mobility of the left knee.  Trigger point release to IT band.  Reviewed self trigger point release to IT band.   Self patellar mobilization Patella mobilization  Edema massage  There-ex:  SLR 2x12 each leg   SAQ 3x12 3lbs on the right no weight on the left   Neuromuscular reeducation: Heel toe rock 3 x 10  Slow march with weightbearing on right 3 x 10     PATIENT EDUCATION:  Education details: Teacher, Music of condition, POC, HEP, exercise form/rationale Person educated: Patient Education method: Explanation, Demonstration, Tactile cues, Verbal cues, and Handouts Education comprehension: verbalized understanding, returned demonstration, verbal cues required, tactile cues required, and needs further education  HOME EXERCISE PROGRAM: Access Code: [email or web address] URL: [email or web address]  ASSESSMENT:  CLINICAL IMPRESSION: The patients knee flexion was measured at 100 degrees today. Overall she is making progress  with her ROM. She continues to have pain but has a surgery scheduled for February 24th. At this point, pt will benefit from strengthening the knee and glut AMAP before surgery to help with post-surgical outcomes.   Eval: The patient returns to physical therapy following an acute onset of left knee pain.  She has been able to do her exercises to reduce pain.  Therapy focused manual therapy to reduce inflammation she has some difficulty even tolerating manual therapy today.  She did report mild improvement in pain and motion following physical therapy.  We reviewed light exercises today to begin trying.  She was also advised that she can move her knee on something like the NuStep in a pain-free range that might be helpful.  She would benefit from skilled therapy to improve range of motion, general functional mobility, and reduce strength.  Therapy trialed vasopneumatic icing today.  She did not like vasopneumatic device.  We will not perform again in the future .CLINICAL DECISION MAKING: Stable/uncomplicated  EVALUATION COMPLEXITY: Low   GOALS: Goals reviewed with patient? Yes  SHORT TERM GOALS: Target date: 01/21/2024    Patient will increase left knee flexion by 30 degrees baseline: Goal status: more limited today 12/24  2.  Patient will increase passive'knee extension 12 degrees Baseline:  Goal status:  3.  Patient will ambulate 500 feet without significant increase in left knee pain    LONG TERM GOALS: Target date: 02/18/2024    Stand as long as necessary for work with minimal discomfort Baseline:  Goal status: Initial  2.  LEFS to improve by MDC x2 Baseline:  Goal status: Initial 3.  Patient will go up and down 8 steps with reciprocal gait pattern without significant increase in left knee pain Goal status: Initial 4.  Easily don socks and shoes independently Baseline:  Goal status: Initial       PLAN:  PT FREQUENCY: 1-2x/week  PT DURATION:8 weeks  PLANNED  INTERVENTIONS: 97164- PT Re-evaluation, 97750- Physical Performance Testing, 97110-Therapeutic exercises, 97530- Therapeutic activity, [id number]- Neuromuscular re-education, 97535- Self Care, [id number]- Manual therapy, [phone]- Gait training, [phone]- Aquatic Therapy, Patient/Family education, Balance training, Stair training, Taping, Dry Needling, Joint mobilization, Spinal mobilization, Scar mobilization, Cryotherapy, and Moist heat.  PLAN FOR NEXT SESSION: Continue with manual therapy to reduce acute inflammation and improve range of motion.  Progress back and exercises as tolerated. Alm Don PT DPT  06/05/24 4:35 PM    Referring diagnosis?  S03.358 (ICD-10-CM) - Status post total replacement of right hip    Treatment diagnosis? (if different than referring diagnosis) M25.551 What was this (referring dx) caused by? [x]  Surgery []  Fall []  Ongoing issue []  Arthritis []  Other: ____________  Laterality: [x]  Rt []  Lt []  Both  Check all possible CPT codes:  *CHOOSE 10 OR LESS*    See Planned Interventions listed in the Plan section of the Evaluation.     "

## 2024-06-09 ENCOUNTER — Ambulatory Visit (HOSPITAL_BASED_OUTPATIENT_CLINIC_OR_DEPARTMENT_OTHER): Payer: Self-pay | Admitting: Physical Therapy

## 2024-06-10 ENCOUNTER — Ambulatory Visit (HOSPITAL_COMMUNITY)

## 2024-06-18 ENCOUNTER — Encounter (HOSPITAL_BASED_OUTPATIENT_CLINIC_OR_DEPARTMENT_OTHER): Admitting: Physical Therapy

## 2024-07-01 ENCOUNTER — Ambulatory Visit (HOSPITAL_BASED_OUTPATIENT_CLINIC_OR_DEPARTMENT_OTHER): Admitting: Physical Therapy

## 2024-07-03 ENCOUNTER — Encounter (HOSPITAL_BASED_OUTPATIENT_CLINIC_OR_DEPARTMENT_OTHER): Admitting: Physical Therapy

## 2024-07-07 ENCOUNTER — Encounter (HOSPITAL_BASED_OUTPATIENT_CLINIC_OR_DEPARTMENT_OTHER): Admitting: Physical Therapy

## 2024-07-09 ENCOUNTER — Encounter (HOSPITAL_BASED_OUTPATIENT_CLINIC_OR_DEPARTMENT_OTHER): Admitting: Physical Therapy

## 2024-07-13 ENCOUNTER — Ambulatory Visit (HOSPITAL_COMMUNITY)

## 2024-07-13 ENCOUNTER — Ambulatory Visit: Admitting: Cardiovascular Disease

## 2024-07-15 ENCOUNTER — Encounter (HOSPITAL_BASED_OUTPATIENT_CLINIC_OR_DEPARTMENT_OTHER): Admitting: Physical Therapy

## 2024-07-17 ENCOUNTER — Encounter (HOSPITAL_BASED_OUTPATIENT_CLINIC_OR_DEPARTMENT_OTHER): Admitting: Physical Therapy

## 2024-07-21 ENCOUNTER — Encounter (HOSPITAL_BASED_OUTPATIENT_CLINIC_OR_DEPARTMENT_OTHER): Admitting: Physical Therapy

## 2024-07-23 ENCOUNTER — Encounter (HOSPITAL_BASED_OUTPATIENT_CLINIC_OR_DEPARTMENT_OTHER): Admitting: Physical Therapy

## 2024-07-28 ENCOUNTER — Encounter (HOSPITAL_BASED_OUTPATIENT_CLINIC_OR_DEPARTMENT_OTHER): Admitting: Physical Therapy

## 2024-07-30 ENCOUNTER — Encounter (HOSPITAL_BASED_OUTPATIENT_CLINIC_OR_DEPARTMENT_OTHER): Admitting: Physical Therapy

## 2024-08-04 ENCOUNTER — Encounter (HOSPITAL_BASED_OUTPATIENT_CLINIC_OR_DEPARTMENT_OTHER): Admitting: Physical Therapy

## 2024-08-06 ENCOUNTER — Encounter (HOSPITAL_BASED_OUTPATIENT_CLINIC_OR_DEPARTMENT_OTHER): Admitting: Physical Therapy

## 2024-08-11 ENCOUNTER — Encounter (HOSPITAL_BASED_OUTPATIENT_CLINIC_OR_DEPARTMENT_OTHER): Admitting: Physical Therapy

## 2024-08-13 ENCOUNTER — Encounter (HOSPITAL_BASED_OUTPATIENT_CLINIC_OR_DEPARTMENT_OTHER): Admitting: Physical Therapy
